# Patient Record
Sex: Male | Born: 1960 | Race: White | Hispanic: No | Marital: Married | Smoking: Former smoker
Health system: Southern US, Community
[De-identification: ages and names within clinical notes are randomized; demographics above are authoritative.]

## PROBLEM LIST (undated history)

## (undated) DIAGNOSIS — B1910 Unspecified viral hepatitis B without hepatic coma: Secondary | ICD-10-CM

## (undated) DIAGNOSIS — K859 Acute pancreatitis without necrosis or infection, unspecified: Secondary | ICD-10-CM

## (undated) DIAGNOSIS — Z87442 Personal history of urinary calculi: Secondary | ICD-10-CM

## (undated) DIAGNOSIS — I10 Essential (primary) hypertension: Secondary | ICD-10-CM

## (undated) DIAGNOSIS — E785 Hyperlipidemia, unspecified: Secondary | ICD-10-CM

## (undated) DIAGNOSIS — K219 Gastro-esophageal reflux disease without esophagitis: Secondary | ICD-10-CM

## (undated) DIAGNOSIS — Z5189 Encounter for other specified aftercare: Secondary | ICD-10-CM

## (undated) DIAGNOSIS — R161 Splenomegaly, not elsewhere classified: Secondary | ICD-10-CM

## (undated) DIAGNOSIS — F32A Depression, unspecified: Secondary | ICD-10-CM

## (undated) DIAGNOSIS — G4733 Obstructive sleep apnea (adult) (pediatric): Secondary | ICD-10-CM

## (undated) DIAGNOSIS — I2510 Atherosclerotic heart disease of native coronary artery without angina pectoris: Secondary | ICD-10-CM

## (undated) DIAGNOSIS — M5430 Sciatica, unspecified side: Secondary | ICD-10-CM

## (undated) DIAGNOSIS — A0472 Enterocolitis due to Clostridium difficile, not specified as recurrent: Secondary | ICD-10-CM

## (undated) DIAGNOSIS — IMO0001 Reserved for inherently not codable concepts without codable children: Secondary | ICD-10-CM

## (undated) DIAGNOSIS — M1990 Unspecified osteoarthritis, unspecified site: Secondary | ICD-10-CM

## (undated) DIAGNOSIS — F329 Major depressive disorder, single episode, unspecified: Secondary | ICD-10-CM

## (undated) DIAGNOSIS — Z8619 Personal history of other infectious and parasitic diseases: Secondary | ICD-10-CM

## (undated) DIAGNOSIS — F419 Anxiety disorder, unspecified: Secondary | ICD-10-CM

## (undated) DIAGNOSIS — D696 Thrombocytopenia, unspecified: Secondary | ICD-10-CM

## (undated) DIAGNOSIS — D759 Disease of blood and blood-forming organs, unspecified: Secondary | ICD-10-CM

## (undated) DIAGNOSIS — K759 Inflammatory liver disease, unspecified: Secondary | ICD-10-CM

## (undated) HISTORY — DX: Sciatica, unspecified side: M54.30

## (undated) HISTORY — PX: HAND SURGERY: SHX662

## (undated) HISTORY — DX: Gastro-esophageal reflux disease without esophagitis: K21.9

## (undated) HISTORY — DX: Obstructive sleep apnea (adult) (pediatric): G47.33

## (undated) HISTORY — PX: KNEE SURGERY: SHX244

## (undated) HISTORY — DX: Major depressive disorder, single episode, unspecified: F32.9

## (undated) HISTORY — DX: Unspecified osteoarthritis, unspecified site: M19.90

## (undated) HISTORY — DX: Splenomegaly, not elsewhere classified: R16.1

## (undated) HISTORY — DX: Hyperlipidemia, unspecified: E78.5

## (undated) HISTORY — DX: Atherosclerotic heart disease of native coronary artery without angina pectoris: I25.10

## (undated) HISTORY — PX: COLONOSCOPY W/ POLYPECTOMY: SHX1380

## (undated) HISTORY — DX: Depression, unspecified: F32.A

## (undated) HISTORY — DX: Essential (primary) hypertension: I10

## (undated) HISTORY — DX: Unspecified viral hepatitis B without hepatic coma: B19.10

## (undated) HISTORY — PX: SHOULDER SURGERY: SHX246

## (undated) HISTORY — DX: Thrombocytopenia, unspecified: D69.6

## (undated) HISTORY — PX: KYPHOPLASTY: SHX5884

## (undated) HISTORY — PX: BACK SURGERY: SHX140

---

## 1997-12-20 ENCOUNTER — Ambulatory Visit (HOSPITAL_BASED_OUTPATIENT_CLINIC_OR_DEPARTMENT_OTHER): Admission: RE | Admit: 1997-12-20 | Discharge: 1997-12-20 | Payer: Self-pay | Admitting: Orthopedic Surgery

## 1998-04-04 ENCOUNTER — Ambulatory Visit (HOSPITAL_BASED_OUTPATIENT_CLINIC_OR_DEPARTMENT_OTHER): Admission: RE | Admit: 1998-04-04 | Discharge: 1998-04-04 | Payer: Self-pay | Admitting: Orthopedic Surgery

## 1998-10-31 ENCOUNTER — Ambulatory Visit (HOSPITAL_BASED_OUTPATIENT_CLINIC_OR_DEPARTMENT_OTHER): Admission: RE | Admit: 1998-10-31 | Discharge: 1998-10-31 | Payer: Self-pay | Admitting: Orthopedic Surgery

## 1999-05-30 ENCOUNTER — Ambulatory Visit (HOSPITAL_BASED_OUTPATIENT_CLINIC_OR_DEPARTMENT_OTHER): Admission: RE | Admit: 1999-05-30 | Discharge: 1999-05-30 | Payer: Self-pay | Admitting: Orthopaedic Surgery

## 1999-10-19 ENCOUNTER — Ambulatory Visit (HOSPITAL_BASED_OUTPATIENT_CLINIC_OR_DEPARTMENT_OTHER): Admission: RE | Admit: 1999-10-19 | Discharge: 1999-10-19 | Payer: Self-pay | Admitting: Orthopaedic Surgery

## 1999-10-21 ENCOUNTER — Emergency Department (HOSPITAL_COMMUNITY): Admission: EM | Admit: 1999-10-21 | Discharge: 1999-10-21 | Payer: Self-pay | Admitting: Emergency Medicine

## 2000-03-19 ENCOUNTER — Encounter: Payer: Self-pay | Admitting: Orthopedic Surgery

## 2000-03-19 ENCOUNTER — Encounter: Admission: RE | Admit: 2000-03-19 | Discharge: 2000-03-19 | Payer: Self-pay | Admitting: Orthopedic Surgery

## 2003-02-23 ENCOUNTER — Emergency Department (HOSPITAL_COMMUNITY): Admission: EM | Admit: 2003-02-23 | Discharge: 2003-02-23 | Payer: Self-pay | Admitting: Emergency Medicine

## 2003-04-12 ENCOUNTER — Ambulatory Visit (HOSPITAL_COMMUNITY): Admission: RE | Admit: 2003-04-12 | Discharge: 2003-04-12 | Payer: Self-pay | Admitting: Family Medicine

## 2006-03-31 ENCOUNTER — Encounter: Admission: RE | Admit: 2006-03-31 | Discharge: 2006-03-31 | Payer: Self-pay | Admitting: Orthopaedic Surgery

## 2006-08-06 ENCOUNTER — Emergency Department (HOSPITAL_COMMUNITY): Admission: EM | Admit: 2006-08-06 | Discharge: 2006-08-06 | Payer: Self-pay | Admitting: Emergency Medicine

## 2006-09-12 ENCOUNTER — Emergency Department (HOSPITAL_COMMUNITY): Admission: EM | Admit: 2006-09-12 | Discharge: 2006-09-12 | Payer: Self-pay | Admitting: Emergency Medicine

## 2007-01-25 ENCOUNTER — Emergency Department (HOSPITAL_COMMUNITY): Admission: EM | Admit: 2007-01-25 | Discharge: 2007-01-25 | Payer: Self-pay | Admitting: Emergency Medicine

## 2007-02-25 ENCOUNTER — Emergency Department (HOSPITAL_COMMUNITY): Admission: EM | Admit: 2007-02-25 | Discharge: 2007-02-25 | Payer: Self-pay | Admitting: Emergency Medicine

## 2007-03-16 DIAGNOSIS — B1910 Unspecified viral hepatitis B without hepatic coma: Secondary | ICD-10-CM

## 2007-03-16 HISTORY — DX: Unspecified viral hepatitis B without hepatic coma: B19.10

## 2007-05-16 ENCOUNTER — Encounter: Admission: RE | Admit: 2007-05-16 | Discharge: 2007-08-14 | Payer: Self-pay | Admitting: Anesthesiology

## 2007-05-20 ENCOUNTER — Ambulatory Visit: Payer: Self-pay | Admitting: Anesthesiology

## 2007-06-17 ENCOUNTER — Ambulatory Visit: Payer: Self-pay | Admitting: Anesthesiology

## 2007-07-15 ENCOUNTER — Ambulatory Visit: Payer: Self-pay | Admitting: Anesthesiology

## 2007-07-31 ENCOUNTER — Encounter: Admission: RE | Admit: 2007-07-31 | Discharge: 2007-10-29 | Payer: Self-pay | Admitting: Anesthesiology

## 2007-08-05 ENCOUNTER — Ambulatory Visit: Payer: Self-pay | Admitting: Anesthesiology

## 2007-08-21 ENCOUNTER — Ambulatory Visit: Payer: Self-pay | Admitting: Anesthesiology

## 2007-09-09 ENCOUNTER — Ambulatory Visit: Payer: Self-pay | Admitting: Anesthesiology

## 2007-10-31 ENCOUNTER — Encounter: Admission: RE | Admit: 2007-10-31 | Discharge: 2007-11-04 | Payer: Self-pay | Admitting: Anesthesiology

## 2007-11-04 ENCOUNTER — Ambulatory Visit: Payer: Self-pay | Admitting: Anesthesiology

## 2009-07-07 ENCOUNTER — Encounter: Admission: RE | Admit: 2009-07-07 | Discharge: 2009-07-07 | Payer: Self-pay | Admitting: Family Medicine

## 2010-03-05 ENCOUNTER — Encounter: Payer: Self-pay | Admitting: Orthopaedic Surgery

## 2010-06-27 NOTE — Assessment & Plan Note (Signed)
REASON FOR VISIT:  Victor [name] is a pleasant, 50 year old who comes to  [place] with a right hand injury.  He comes to me with a TENS unit on, his  hand in a desensitizing glove and he is relating his pain as 7/10 on a  subjective scale.  It is sharp, constant tingling and aching.  His  medications are attached to the chart.  Work-related incident.  With his  permission, case manager to the room.  He has difficulty with  restorative sleep capacity.  He is currently not working.  He was in  heating and air.  He has been terminated because they did not have a  position for him.  He had some restrictions on an FCE.  In the interim,  Dr. Yolanda Bonine has performed another number of interventions.  He thinks  they just help temporarily.  He does not describe classic pseudomotor  changes.  He has been seen by [employer] and apparently, he  was felt to have carpal tunnel and some other orthopedic joint injuries.  Medications and allergies attached to chart.  A 14-point review of  systems.   PAST MEDICAL HISTORY:  1. Hypertension.  2. Diabetes.  3. Recent exposure to hepatitis B and he is jaundice.  He is followed      by Dr. Randa Evens.   FAMILY HISTORY:  Otherwise denied medical illnesses.   SOCIAL HISTORY:  Denies chemical ir alcohol abuse.  Denies IVDA.   Review of systems, family and social history otherwise noncontributory  to the pain problem.   PHYSICAL EXAMINATION:  GENERAL:  A pleasant male.  Gait, affect,  appearance is normal.  Oriented x3.  HEENT:  He is jaundice-appearing.  Sclerotic.  Otherwise unremarkable.  CHEST:  Clear to auscultation and percussion with increased AP diameter.  ABDOMEN:  I can barely feel the liver edge, but it is a little tender.  HEART:  Regular rate and rhythm without murmurs, rubs or gallops.  BACK:  Diffuse paralumbar, myofascial, cervical myofascial discomfort,  probably unrelated.  NEUROLOGIC:  His right hand is somewhat pulling away, but no  classic  pseudomotor changes.  Good capillary filling.  Fair grip strength, but  restrictive.  Decreased range of motion, active, but passive and full to  the wrist.  I do not find anything new from a neurological perspective.   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   PLAN:  1. Full informed consent to UDS.  2. According to his GI folks, he is unable to take any medications and      so he and his case manager will let me know when we can consider      treatment profile.  As I have reviewed the chart, there is some      evidence that he has obtained medications from multiple sources and      an ongoing adherence monitoring will be mandatory.  I don't think      necessarily that opioids would be are best first choice and I may      trial Ultram ER as we will avoid acetaminophen exposure.  3. Desensitization maneuvers will be discussed.  4. I do not plan interventions.  We will continue this when we follow      up in 1 month.  Will follow along.  Questions are answered and no      barrier to communication.          ______________________________  Celene Kras, MD    HH/MedQ  D:  05/20/2007 13:27:13  T:  05/20/2007 14:30:54  Job #:  [id number]

## 2010-06-27 NOTE — Assessment & Plan Note (Signed)
Victor [name] comes in our Pain Management today.  I evaluated him and  reviewed the Health and History form and 14-point review of systems.   1. Victor [name]'s hand has a normal appearance, good vascular integrity, and      adequate grip strength, no evidence of CRPS.  2. With his permission case manager to the room.  3. I think it is probably reasonable to go onto MMI.  We will go ahead      and follow him expectantly, and emphasize non-narcotic medication      alternatives, and the Flector is p.r.n. if he thinks it helps.  He      is not sure it does, so we will just rely on OTC Tylenol, NSAID,      and see how he does here.  4. We will see him at essentially p.r.n., reviewed with him, no      barrier to communication.   [name] as noted good vascular integrity, he has adequate grip  strength, nothing new neurologically.   IMPRESSION:  Osteoarthritis, possible complex regional pain syndrome  variant.   PLAN:  Conservative management MMI.  I agree impairment.           ______________________________  Celene Kras, MD     HH/MedQ  D:  11/04/2007 10:48:07  T:  11/05/2007 01:21:53  Job #:  [id number]

## 2010-06-27 NOTE — Assessment & Plan Note (Signed)
[name]  I  evaluated her and reviewed the Health and History form and 14-point  review of systems.   He has taken Darvocet.  He was unable to tolerate the [place] due to  potential drug interactions, but I am not really clear on that.  He does  have a patch, and it would be an option for [name], but I am  going to defer to nonnarcotic medication alternatives and I will trial  __________ .  As I relate him, it is probably time to come off  controlled substances, follow conservatively, and I have reviewed the  risk, complications, and options.  I plan to follow up with him in 2  months, and essentially from my position, MMI.   [name], his hand looks better than I have seen it in some time.  He  has adequate range of motion.  Good capillary filling.  He is using his  TENS technology.  As I understand that he has been cleared by his  gastroenterologist, and he is cautioned as to NSAID profile.   We have reviewed this medication.  Questions are answered.   We will see him in followup.           ______________________________  Celene Kras, MD     HH/MedQ  D:  09/09/2007 10:33:42  T:  09/10/2007 02:01:38  Job #:  [id number]

## 2010-06-27 NOTE — Assessment & Plan Note (Signed)
Victor [name] comes to [facility] today to review health  and history form and 14-point review of systems.  I examined him with  his permission and case manager to the room.   1. He is not showing any advancing pseudomotor changes.  He is showing      some edema, good capillary filling, and he has no significant      change in sensory or motor.  Neurologic stable.  2. I do not think he will probably need any blocks.  We will see how      he does over the next month and we may consider a 3-phase bone scan      but he is going to have to be cleared by his gastroenterologist, as      he is still under his purview, and he does not want him to have any      other medications.  He should be on Xanax and Nexium, I am going to      add Lidoderm which should be fine topically.  3. I discussed desensitization maneuvers.  4. Modifiable features in health profile discussed.  5. Full informed consent UDS.  This may help [place] with the latitude for      pain medications if we need to, but at this point I do not think it      is going to be necessary to go beyond non-narcotic medication      alternatives.  Consider adjuncts.  From our position, he will      probably be moving toward MMI fairly soon, and we will just monitor      him for med control and pain control, and assist in modifiable      features in health profile to best outcome.   OBJECTIVE:  His hand shows modest edema, good capillary filling,  adequate range of motion without significant change.  Nothing new  neurologically.   IMPRESSION:  Otherwise unchanged.  See RPS variant.  Peripheral  neuropathy, unspecified.   PLAN:  Conservative management.   DISCHARGE INSTRUCTIONS:  Given.  I will see him in followup.           ______________________________  Celene Kras, MD     HH/MedQ  D:  07/15/2007 11:48:08  T:  07/15/2007 12:55:25  Job #:  [id number]

## 2010-06-27 NOTE — Assessment & Plan Note (Signed)
1. [name] and I evaluated him      with the Health and History form with 10-point review of systems.      He has a note from his gastroenterologist that he is not to have      any medications.  He does, however, take Xanax for restorative      sleep capacity, and that showed up on the UDS.  I reviewed that      with him.  He has been cleared with Nexium with his primary care,      and I am thinking he probably would do fine with a tramadol-like      medication, avoiding acetaminophen and NSAID and would probably      metabolize well.  His liver function is improving.  I just do not      think we are going to be able to move into that direction at this      time as his gastroenterologist wants to give him another month.   1. To this end, we will go ahead and hold off.  We will see him in one      month and determine further course of care.  Likely second UDS.   1. We discussed treatment limitations and options.  I observed his      hand.  At some point, we probably want to consider a topical such      as Lidoderm or possibly Ultram ER.  I do not believe we want to go      too far into controlled substances.   1. I have again reviewed the UDS with him.  Problematic is that he      asked Dr. Isabell Jarvis for a narcotic prescription, and he stated he did      not know what he was signing last moth as he was ill, but we are      clear on that now, so any further deviations will be unfortunate      for discharge.   OBJECTIVE:  His hand appears with adequate but diminished grip strength.  Thenar eminence intact.  No evidence of pseudomotor changes.  He has his  TENS unit on.  He is wearing his desensitization glove, and I discussed  desensitization maneuvers.   IMPRESSION:  Peripheral neuropathy, unspecified.  CRP as variant.   PLAN:  Conservative management.  Discharge instructions given.  Modifiable features and help profile discussed.  We will see him  in one  month and determine further course of care.           ______________________________  Celene Kras, MD     HH/MedQ  D:  06/17/2007 11:56:47  T:  06/17/2007 12:27:31  Job #:  [id number]

## 2010-06-27 NOTE — Assessment & Plan Note (Signed)
Victor [name] comes in for the pain management today.  I evaluated him via  health and history form and 14-point review of systems.   1. I go over the UDS with him and I am going need to repeat that next      visit at random.  He stated he was not on any medication, but he      told to screener that a couple days before he had Percocet, but he      states he has had nothing since his hepatitis, which is an      inconsistent story.  Point is really moot, I do not think he is      going to need narcotics at this time, or controlled substances and      we can follow him conservatively.  We may put him on gabapentin or      Lyrica, I am going to go ahead and trial Rizol today, and he has      been cleared by his GI doctor for this.  2. His hand shows modest pseudomotor changes, he has a fair grip      strength, still using his hands and he states he is desensitizing      at home.  Continue in this direction.  Really, he is approaching      MMI, I think we can follow him expectantly.  I will see him in the      next month or two.  His case manager is not here, but we will      relate any information as required, and I do not think he is an      interventional candidate at this time.   [name] improving hand, modest pseudomotor changes, good grip  strength, good capillary filling, no pull away, and nothing new  neurologically.   IMPRESSION:  Complex regional pain syndrome.   PLAN:  Conservative management.  Discharge instructions given.           ______________________________  Celene Kras, MD     HH/MedQ  D:  08/05/2007 11:26:38  T:  08/06/2007 03:42:55  Job #:  [id number]

## 2010-06-30 NOTE — Op Note (Signed)
[place]  Patient:    DAIMIEN, PATMON                        MRN: [id number] Proc. Date: 10/19/99 Adm. Date:  [id number] Attending:  Randolm Idol                           Operative Report  PREOPERATIVE DIAGNOSIS:  Tear of medial meniscus left knee.  POSTOPERATIVE DIAGNOSIS:  Tear of medial meniscus left knee.  PROCEDURES: 1. Diagnostic arthroscopy left knee. 2. Partial medial meniscectomy.  SURGEON:  Claude Manges. Cleophas Dunker, M.D.  ANESTHESIA:  IV sedation and local with 1% Xylocaine.  COMPLICATIONS:  None.  HISTORY:  Mr. Kopf is 50 years old and recently returned to work after a prolonged absence from a problem referable to his right knee.  He injured his left knee approximately six weeks ago after returning to work.  He was in the back of a pickup truck, and the bedliner slipped, and he fell to the ground, twisting his knee.  He felt a snap or a pop.  He has had subsequent MRI scan revealing a horizontal tear of the posterior horn of the medial meniscus.  He does have a positive effusion and positive medial joint tenderness of the left knee.  He is now to have arthroscopic evaluation.  DESCRIPTION OF PROCEDURE:  The patient comfortable on the operating table and under IV sedation, the left lower extremity was placed in a thigh holder.  The leg was then prepped with Dura-Prep from the thigh holder to the ankle. Sterile draping was performed.  Diagnostic arthroscopy was performed, using a medial and lateral parapatellar tendon stab wound.  Diagnostic arthroscopy revealed little if any synovitis in the superior pouch. There was no chondromalacia of the patella.  The patella ______ in midline. There were no loose bodies.  Both gutters were clear.  The lateral compartment revealed no evidence of meniscal pathology or chondromalacia.  The ACL appeared to be intact with about a 2 mm anterior drawer sign with a definite end  point.  The medial compartment revealed a tear of the posterior 25-30% of the meniscus.  There were areas of horizontal cleavage tear.  These were debrided with the basket forceps and then the interarticular shaver.  The remaining rim was carefully probed and was intact without any further pathology.  There was an area of chondromalacia of the femoral condyle corresponding to the area of the meniscal tear, and this was shaved.  I did not see any area of exposed subchondral bone, and it probably represented a grade 2 change, possibly grade 3.  The joint was then explored without evidence of loose material.  Two stab wounds were left open and infiltrated with 0.25% Marcaine with epinephrine.  A sterile bulky dressing was applied followed by an ACE bandage.  PLAN:  Percocet for pain.  Office in one week.  No work. DD:  10/19/99 TD:  10/20/99 Job: [id number]

## 2010-08-08 ENCOUNTER — Other Ambulatory Visit: Payer: Self-pay | Admitting: Family Medicine

## 2010-08-08 DIAGNOSIS — M545 Low back pain, unspecified: Secondary | ICD-10-CM

## 2010-08-09 ENCOUNTER — Ambulatory Visit
Admission: RE | Admit: 2010-08-09 | Discharge: 2010-08-09 | Disposition: A | Discharge status: Home / Self Care | Payer: BC Managed Care – PPO | Source: Ambulatory Visit | Attending: Family Medicine | Admitting: Family Medicine

## 2010-08-09 DIAGNOSIS — M545 Low back pain, unspecified: Secondary | ICD-10-CM

## 2010-11-01 LAB — BASIC METABOLIC PANEL
BUN: 18
CO2: 25
Calcium: 9.2
Chloride: 103
Creatinine, Ser: 1.14
GFR calc Af Amer: >60
GFR calc non Af Amer: >60
Glucose, Bld: 90
Potassium: 4
Sodium: 137

## 2010-11-01 LAB — DIFFERENTIAL
Basophils Absolute: 0
Basophils Relative: 0
Eosinophils Absolute: 0.1
Eosinophils Relative: 2
Lymphocytes Relative: 39
Lymphs Abs: 3.4
Monocytes Absolute: 0.8
Monocytes Relative: 9
Neutro Abs: 4.4
Neutrophils Relative %: 51

## 2010-11-01 LAB — CBC
HCT: 44
Hemoglobin: 15.3
MCHC: 34.7
MCV: 86.6
Platelets: 198
RBC: 5.08
RDW: 14.3
WBC: 8.7

## 2010-11-20 LAB — DIFFERENTIAL
Basophils Absolute: 0
Basophils Relative: 1
Eosinophils Absolute: 0.2
Eosinophils Relative: 2
Lymphocytes Relative: 31
Lymphs Abs: 2.5
Monocytes Absolute: 0.7
Monocytes Relative: 8
Neutro Abs: 4.6
Neutrophils Relative %: 58

## 2010-11-20 LAB — I-STAT 8, (EC8 V) (CONVERTED LAB)
BUN: 14
Bicarbonate: 25.8 — ABNORMAL HIGH
Chloride: 101
Glucose, Bld: 125 — ABNORMAL HIGH
HCT: 47
Hemoglobin: 16
Operator id: 294521
Potassium: 3.5
Sodium: 135
TCO2: 27
pCO2, Ven: 44.1 — ABNORMAL LOW
pH, Ven: 7.375 — ABNORMAL HIGH

## 2010-11-20 LAB — POCT I-STAT CREATININE
Creatinine, Ser: 1
Operator id: 294521

## 2010-11-20 LAB — ETHANOL: Alcohol, Ethyl (B): 5

## 2010-11-20 LAB — CBC
HCT: 45.4
Hemoglobin: 15.3
MCHC: 33.6
MCV: 86.8
Platelets: 194
RBC: 5.24
RDW: 13.8
WBC: 7.9

## 2010-11-29 LAB — BASIC METABOLIC PANEL
BUN: 14
CO2: 20
Calcium: 8.8
Chloride: 101
Creatinine, Ser: 1.37
GFR calc Af Amer: >60
GFR calc non Af Amer: 56 — ABNORMAL LOW
Glucose, Bld: 299 — ABNORMAL HIGH
Potassium: 3.7
Sodium: 130 — ABNORMAL LOW

## 2010-11-29 LAB — POCT CARDIAC MARKERS
CKMB, poc: 1.4
CKMB, poc: 1.4
CKMB, poc: 1.8
Myoglobin, poc: 55.7
Myoglobin, poc: 70.7
Myoglobin, poc: 76.4
Operator id: 4531
Operator id: 4531
Operator id: 4531
Troponin i, poc: <0.05
Troponin i, poc: <0.05
Troponin i, poc: <0.05

## 2010-11-29 LAB — CBC
HCT: 46.8
Hemoglobin: 15.8
MCHC: 33.7
MCV: 86
Platelets: 228
RBC: 5.45
RDW: 12.5
WBC: 13.4 — ABNORMAL HIGH

## 2010-11-29 LAB — DIFFERENTIAL
Basophils Absolute: 0.2 — ABNORMAL HIGH
Basophils Relative: 2 — ABNORMAL HIGH
Eosinophils Absolute: 0
Eosinophils Relative: 0
Lymphocytes Relative: 6 — ABNORMAL LOW
Lymphs Abs: 0.8
Monocytes Absolute: 0.1 — ABNORMAL LOW
Monocytes Relative: 0 — ABNORMAL LOW
Neutro Abs: 12.3 — ABNORMAL HIGH
Neutrophils Relative %: 92 — ABNORMAL HIGH

## 2010-11-29 LAB — D-DIMER, QUANTITATIVE: D-Dimer, Quant: 0.38

## 2010-12-05 ENCOUNTER — Encounter (HOSPITAL_COMMUNITY): Payer: BC Managed Care – PPO

## 2010-12-05 ENCOUNTER — Other Ambulatory Visit: Payer: Self-pay | Admitting: Specialist

## 2010-12-05 ENCOUNTER — Other Ambulatory Visit (HOSPITAL_COMMUNITY): Payer: Self-pay | Admitting: Specialist

## 2010-12-05 ENCOUNTER — Ambulatory Visit (HOSPITAL_COMMUNITY)
Admission: RE | Admit: 2010-12-05 | Discharge: 2010-12-05 | Disposition: A | Discharge status: Home / Self Care | Payer: BC Managed Care – PPO | Source: Ambulatory Visit | Attending: Specialist | Admitting: Specialist

## 2010-12-05 DIAGNOSIS — Z01811 Encounter for preprocedural respiratory examination: Secondary | ICD-10-CM | POA: Insufficient documentation

## 2010-12-05 DIAGNOSIS — M48061 Spinal stenosis, lumbar region without neurogenic claudication: Secondary | ICD-10-CM | POA: Insufficient documentation

## 2010-12-05 DIAGNOSIS — I10 Essential (primary) hypertension: Secondary | ICD-10-CM | POA: Insufficient documentation

## 2010-12-05 DIAGNOSIS — E119 Type 2 diabetes mellitus without complications: Secondary | ICD-10-CM | POA: Insufficient documentation

## 2010-12-05 DIAGNOSIS — M545 Low back pain, unspecified: Secondary | ICD-10-CM | POA: Insufficient documentation

## 2010-12-05 DIAGNOSIS — M79609 Pain in unspecified limb: Secondary | ICD-10-CM | POA: Insufficient documentation

## 2010-12-05 DIAGNOSIS — Z01818 Encounter for other preprocedural examination: Secondary | ICD-10-CM | POA: Insufficient documentation

## 2010-12-05 LAB — COMPREHENSIVE METABOLIC PANEL
ALT: 36 U/L (ref 0–53)
AST: 26 U/L (ref 0–37)
Albumin: 3.6 g/dL (ref 3.5–5.2)
Alkaline Phosphatase: 73 U/L (ref 39–117)
BUN: 11 mg/dL (ref 6–23)
CO2: 26 mEq/L (ref 19–32)
Calcium: 9.6 mg/dL (ref 8.4–10.5)
Chloride: 102 mEq/L (ref 96–112)
Creatinine, Ser: 0.73 mg/dL (ref 0.50–1.35)
GFR calc Af Amer: >90 mL/min (ref >90)
GFR calc non Af Amer: >90 mL/min (ref >90)
Glucose, Bld: 175 mg/dL — ABNORMAL HIGH (ref 70–99)
Potassium: 3.8 mEq/L (ref 3.5–5.1)
Sodium: 137 mEq/L (ref 135–145)
Total Bilirubin: 0.6 mg/dL (ref 0.3–1.2)
Total Protein: 7.1 g/dL (ref 6.0–8.3)

## 2010-12-05 LAB — CBC
HCT: 40 % (ref 39.0–52.0)
Hemoglobin: 13.7 g/dL (ref 13.0–17.0)
MCH: 29.7 pg (ref 26.0–34.0)
MCHC: 34.3 g/dL (ref 30.0–36.0)
MCV: 86.8 fL (ref 78.0–100.0)
Platelets: 69 10*3/uL — ABNORMAL LOW (ref 150–400)
RBC: 4.61 MIL/uL (ref 4.22–5.81)
RDW: 13.3 % (ref 11.5–15.5)
WBC: 5.6 10*3/uL (ref 4.0–10.5)

## 2010-12-05 LAB — PROTIME-INR
INR: 1.18 (ref 0.00–1.49)
Prothrombin Time: 15.3 seconds — ABNORMAL HIGH (ref 11.6–15.2)

## 2010-12-05 LAB — URINALYSIS, ROUTINE W REFLEX MICROSCOPIC
Bilirubin Urine: NEGATIVE
Glucose, UA: NEGATIVE mg/dL
Hgb urine dipstick: NEGATIVE
Leukocytes, UA: NEGATIVE
Nitrite: NEGATIVE
Protein, ur: NEGATIVE mg/dL
Specific Gravity, Urine: 1.03 (ref 1.005–1.030)
Urobilinogen, UA: 1 mg/dL (ref 0.0–1.0)
pH: 6 (ref 5.0–8.0)

## 2010-12-05 LAB — SURGICAL PCR SCREEN
MRSA, PCR: NEGATIVE
Staphylococcus aureus: NEGATIVE

## 2010-12-05 LAB — APTT: aPTT: 31 seconds (ref 24–37)

## 2010-12-13 ENCOUNTER — Ambulatory Visit (HOSPITAL_COMMUNITY): Payer: BC Managed Care – PPO

## 2010-12-13 ENCOUNTER — Other Ambulatory Visit: Payer: Self-pay | Admitting: Oncology

## 2010-12-13 ENCOUNTER — Other Ambulatory Visit (HOSPITAL_COMMUNITY): Payer: Self-pay | Admitting: Specialist

## 2010-12-13 ENCOUNTER — Encounter: Payer: Self-pay | Admitting: Oncology

## 2010-12-13 ENCOUNTER — Ambulatory Visit (HOSPITAL_COMMUNITY)
Admission: RE | Admit: 2010-12-13 | Discharge: 2010-12-13 | Disposition: A | Discharge status: Home / Self Care | Payer: BC Managed Care – PPO | Source: Ambulatory Visit | Attending: Specialist | Admitting: Specialist

## 2010-12-13 DIAGNOSIS — K219 Gastro-esophageal reflux disease without esophagitis: Secondary | ICD-10-CM | POA: Insufficient documentation

## 2010-12-13 DIAGNOSIS — M47817 Spondylosis without myelopathy or radiculopathy, lumbosacral region: Secondary | ICD-10-CM | POA: Insufficient documentation

## 2010-12-13 DIAGNOSIS — D696 Thrombocytopenia, unspecified: Secondary | ICD-10-CM

## 2010-12-13 DIAGNOSIS — E785 Hyperlipidemia, unspecified: Secondary | ICD-10-CM

## 2010-12-13 DIAGNOSIS — E119 Type 2 diabetes mellitus without complications: Secondary | ICD-10-CM | POA: Insufficient documentation

## 2010-12-13 DIAGNOSIS — Z01812 Encounter for preprocedural laboratory examination: Secondary | ICD-10-CM | POA: Insufficient documentation

## 2010-12-13 DIAGNOSIS — M545 Low back pain, unspecified: Secondary | ICD-10-CM

## 2010-12-13 DIAGNOSIS — Z01818 Encounter for other preprocedural examination: Secondary | ICD-10-CM | POA: Insufficient documentation

## 2010-12-13 DIAGNOSIS — Z5309 Procedure and treatment not carried out because of other contraindication: Secondary | ICD-10-CM | POA: Insufficient documentation

## 2010-12-13 DIAGNOSIS — M5430 Sciatica, unspecified side: Secondary | ICD-10-CM

## 2010-12-13 DIAGNOSIS — I10 Essential (primary) hypertension: Secondary | ICD-10-CM | POA: Insufficient documentation

## 2010-12-13 DIAGNOSIS — B1910 Unspecified viral hepatitis B without hepatic coma: Secondary | ICD-10-CM

## 2010-12-13 DIAGNOSIS — Z79899 Other long term (current) drug therapy: Secondary | ICD-10-CM | POA: Insufficient documentation

## 2010-12-13 LAB — FOLATE: Folate: 18.6 ng/mL

## 2010-12-13 LAB — GLUCOSE, CAPILLARY
Glucose-Capillary: 125 mg/dL — ABNORMAL HIGH (ref 70–99)
Glucose-Capillary: 134 mg/dL — ABNORMAL HIGH (ref 70–99)
Glucose-Capillary: 170 mg/dL — ABNORMAL HIGH (ref 70–99)

## 2010-12-13 LAB — PLATELET FUNCTION ASSAY: Collagen / Epinephrine: 151 seconds (ref 0–184)

## 2010-12-13 LAB — SAVE SMEAR

## 2010-12-13 LAB — PLATELET COUNT
Platelets: 82 10*3/uL — ABNORMAL LOW (ref 150–400)
Platelets: 61 10*3/uL — ABNORMAL LOW (ref 150–400)

## 2010-12-13 LAB — ABO/RH: ABO/RH(D): A POS

## 2010-12-14 ENCOUNTER — Other Ambulatory Visit: Payer: Self-pay | Admitting: Oncology

## 2010-12-14 ENCOUNTER — Encounter (HOSPITAL_BASED_OUTPATIENT_CLINIC_OR_DEPARTMENT_OTHER): Payer: BC Managed Care – PPO | Admitting: Oncology

## 2010-12-14 ENCOUNTER — Encounter: Payer: BC Managed Care – PPO | Admitting: Oncology

## 2010-12-14 DIAGNOSIS — Y849 Medical procedure, unspecified as the cause of abnormal reaction of the patient, or of later complication, without mention of misadventure at the time of the procedure: Secondary | ICD-10-CM

## 2010-12-14 DIAGNOSIS — K766 Portal hypertension: Secondary | ICD-10-CM

## 2010-12-14 DIAGNOSIS — B181 Chronic viral hepatitis B without delta-agent: Secondary | ICD-10-CM

## 2010-12-14 LAB — CBC WITH DIFFERENTIAL/PLATELET
BASO%: 0.2 % (ref 0.0–2.0)
Basophils Absolute: 0 10*3/uL (ref 0.0–0.1)
EOS%: 2.5 % (ref 0.0–7.0)
Eosinophils Absolute: 0.1 10*3/uL (ref 0.0–0.5)
HCT: 41.5 % (ref 38.4–49.9)
HGB: 14 g/dL (ref 13.0–17.1)
LYMPH%: 27 % (ref 14.0–49.0)
MCH: 30.2 pg (ref 27.2–33.4)
MCHC: 33.7 g/dL (ref 32.0–36.0)
MCV: 89.7 fL (ref 79.3–98.0)
MONO#: 0.4 10*3/uL (ref 0.1–0.9)
MONO%: 7.9 % (ref 0.0–14.0)
NEUT#: 3.1 10*3/uL (ref 1.5–6.5)
NEUT%: 62.4 % (ref 39.0–75.0)
Platelets: 71 10*3/uL — ABNORMAL LOW (ref 140–400)
RBC: 4.62 10*6/uL (ref 4.20–5.82)
RDW: 14.1 % (ref 11.0–14.6)
WBC: 4.9 10*3/uL (ref 4.0–10.3)
lymph#: 1.3 10*3/uL (ref 0.9–3.3)

## 2010-12-14 LAB — MORPHOLOGY: PLT EST: DECREASED

## 2010-12-14 LAB — PREPARE PLATELET PHERESIS
Unit division: 0
Unit division: 0
Unit division: 0

## 2010-12-14 LAB — CHCC SMEAR

## 2010-12-14 NOTE — Consult Note (Signed)
NAME:  Victor [name], Victor [name] NO.:  [id number]  MEDICAL RECORD NO.:  [id number]  LOCATION:  DAYL                         FACILITY:  [facility]  PHYSICIAN:  Victor [name], M.D.        DATE OF BIRTH:  [birth year]  DATE OF CONSULTATION:  12/13/2010 DATE OF DISCHARGE:                                CONSULTATION   REQUESTING PHYSICIAN:  Victor [name], M.D.  REASON FOR CONSULT:  Thrombocytopenia.  CONSULTING PHYSICIAN:  Victor [name], M.D.  HISTORY OF PRESENT ILLNESS:  Victor [name] is a pleasant 50 year old male with a recurrent history of thrombocytopenia at least dating back to the year 2005.  The patient has been evaluated as an outpatient regarding this issue, and was to be referred to a hematologist for further workup, when he had to be admitted electively to [facility] for L5-S1 decompression on the left side.  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 His platelet count on December 08, 2010, was 69,000 and repeat platelet count was 82,000 today.  In preparation for surgery, 1 unit of platelets has been ordered.  A smear has been requested for further review.  Labs on February 23, 2003, showed platelet count of 84,000, on August 06, 2006, was 92,000.  Between January 25, 2007, and February 25, 2007, it was in the 190s.  However, this value dropped again during the last month.  He denies any acute bleeding.  No recent treatment.  He denies any new medications.  Of note, he has been taking on and off NSAIDs, but in not significant amounts.  He denies any fever, chills, or night sweats.  No headaches, mental status changes, or vision changes.  He denies any respiratory or cardiac complaints.  He also denies any abdominal pain. Of note, he did have either prior exposure or history of hepatitis, which per the patient's report is B type, which records have been requested, for further review.  He denies any risk for HIV.  No weight loss or fatigue.  His main complaint is back pain for  which he has been operated today.  We were asked to see him in consultation with recommendations regarding his care.  PAST MEDICAL HISTORY: 1. History of hepatitis B as reported by the patient, "not treated,     they let it run its course."  I personally talked to Dr. Carman [name] who treated him in the past.  Patient has not seen Dr.      Randa [name] for a while, and paper record was not readily avaible.       Dr. Randa [name] will try to track this down and fax to me in the     near future. 2. Hypertension. 3. Diabetes mellitus type 2. 4. Remote history of tobacco. 5. GERD. 6. Depression. 7. Osteoarthritis.  SURGERIES: 1. Status post diagnostic arthroscopy of the right knee, with     plicectomy, shaving of chondromalacia of the patellofemoral joint     and microfracture technique to the trochlea and shaving of the     medial femoral chondral by Dr. Cleophas [name] on May 31, 1999. 2. Status post diagnostic arthroscopy of the  left knee, with partial     medial meniscectomy, Dr. Cleophas [name] on October 19, 1999. 3. Status post right hand surgery per the patient's report. 4. Status post left shoulder surgery, per the patient's report. 5. Status post four other surgeries of the right knee, per the     patient's report. 6. Status post three other surgeries of the left knee per the     patient's report.  ALLERGIES:  FLEXERIL, DARVOCET, VICODIN, and DEMEROL.  MEDICATIONS:  Now are: 1. Bupivacaine as directed. 2. Ancef 2 g IV as directed. 3. Fentanyl 100 mcg as directed. 4. Percocet 2 tabs p.o. to OR. 5. Acetaminophen 1000 mg IV as directed. 6. Double antibiotic irrigation 500 mg IR as directed. 7. The patient is receiving 1 unit of platelets at this time.  At home, his medications include Dilaudid, Emsam, Nexium, "blood pressure medication," fish oil, Januvia, Lipitor, and Xanax.  REVIEW OF SYSTEMS:  See HPI for significant positives, the rest of the review of systems is negative.   The patient denies any easy bruising.  FAMILY HISTORY:  Mother died at 45 with lung cancer, father is alive and well.  He has 3 brothers and 3 sisters, all in good health.  There is no family history of thrombocytopenia, or other blood dyscrasias.  SOCIAL HISTORY:  The patient is married to wife Victor [name] over the last 15 years.  He has one son in good health.  He lives in [place], [place].  Full code.  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  Due to debilitation of the right hand, he is not currently working.  He smoked remotely, quit 25 years ago.  He denies any alcohol or recreational drug use.  He never had a colonoscopy.  His last physical was about 3 months ago.  PHYSICAL EXAMINATION:  GENERAL:  This is an obese 50 year old white male, in no acute distress.  Alert and oriented x3. VITAL SIGNS:  Blood pressure 145/83, pulse 70, respirations 18, temperature 98, O2 sat 98% on room air.  Weight 126 kg, height 72 inches. HEENT:  Normocephalic, atraumatic.  Sclerae anicteric.  Oral cavity without thrush or lesions. NECK:  Supple.  No cervical or supraclavicular masses. LUNGS:  Essentially clear to auscultation with no wheezing, rhonchi, or rales.  No accessory muscles. CARDIOVASCULAR:  Regular rate and rhythm without murmurs, rubs, or gallops. ABDOMEN:  Soft, nontender.  Bowel sounds x4.  No hepatosplenomegaly. His abdomen is obese. GU AND RECTAL:  Deferred. EXTREMITIES:  No clubbing or cyanosis.  No edema.  No inguinal masses, no bruising or petechial rash. NEURO:  Nonfocal except for his main complaint at the spinal area, which causes debilitation, which is being followed by his surgeon.  LABORATORY DATA:  Hemoglobin 13.7, hematocrit 40, white count 5.6, platelets 82, MCV 86.8, ANC 4.4, lymphocytes 3.4, monocytes 0.8.  PTT 31, PT 15.3, INR 1.18.  Sodium 137, potassium 3.8, BUN 11, creatinine 0.73, glucose 175, total bilirubin 0.6, alkaline phosphatase  73, AST 26 , ALT 36, total protein 7.1, albumin 3.6, calcium 9.6.   I personally reviewed his peripheral blood smear today.  There was isocytosis.  There was no peripheral blast.  There was no schistocytosis, spherocytosis, target cell, rouleaux formation, tear drop cell.  There was no giant platelets or platelet clumps.     I personally reviewed his peripheral blood smear today.  There was isocytosis.  There was no peripheral blast.  The neutrophils appeared low hypogranulated.  There  was no schistocytosis, spherocytosis, target cell, rouleaux formation, tear drop cell.   There was no giant platelets or platelet clumps.      problem list:    #1 symptomatic left septic [place].  #2 personal report a history of hepatitis B. Record is pending.  #3 diabetic mellitus. #4 hypertension.  #5 hyperlipidemia.  #6 chronic thrombocytopenia going on for at least 2 years.   impression:   my differential diagnosis for this patient include hepatitis B versus cirrhosis versus splenomegaly versus statin-induced thrombocytopenia versus less likely chronic  ITP  and the primary bone marrow failure states. However given the  progression nature of his thrombocytopenia; patient reports that it was in the low [address] until now [address]. I cannot rule out primary bone marrow failure problem without further workup for such diagnosis of myelofibrosis and myelodysplastic  syndrome.    recommendations: I recommended outpatient lab work up with me in the next few days for hepatitis B, hep C; HIV, ANA, TSH. Vitamin B12 and folate levels were sent earlier today.   outpatient abdominal ultrasound with Doppler to rule out cirrhosis and  splenomegaly division hepatitis B. Outpatient bone marrow biopsy to rule out primary bone marrow failure states such as myelofibrosis given that he has hypo- granulation of the granuloctyes and progressive  thrombocytopenia. stop statin for now which can  contribute to   thrombocytopenia. if workup is negative the diagnosis of exclusion his ITP and may consider a course of prednisone at that time if his thrombocytopenia worsens significantly.  Even though he did not get a bump of his the counts with  transfusion of the today, I see no evidence of microangiopathic hemolytic anemia such as DIC or TTP on blood smear review today. Hold off on surgery as I discussed Dr. Jillyn Hidden today until  his thrombocytopenia is worked up. I will follow the patient as  outpatient.   thank you for this kind consultation.   Victor [name], M.D.     HTH/MEDQ  D:  12/13/2010  T:  12/13/2010  Job:  [id number]  cc:   Tally Joe, M.D. Fax: [phone]  Electronically Signed by Jethro Bolus MD on 12/14/2010 08:10:47 PM

## 2010-12-17 LAB — HEPATITIS B CORE ANTIBODY, TOTAL: Hep B Core Total Ab: POSITIVE — AB

## 2010-12-18 ENCOUNTER — Other Ambulatory Visit: Payer: Self-pay | Admitting: Oncology

## 2010-12-19 LAB — HEPATITIS B SURFACE ANTIGEN: Hepatitis B Surface Ag: NEGATIVE

## 2010-12-19 LAB — HIV ANTIBODY (ROUTINE TESTING W REFLEX): HIV: NONREACTIVE

## 2010-12-19 LAB — TSH: TSH: 0.813 u[IU]/mL (ref 0.350–4.500)

## 2010-12-19 LAB — HEPATITIS B SURFACE ANTIBODY,QUALITATIVE: Hep B S Ab: POSITIVE — AB

## 2010-12-20 ENCOUNTER — Other Ambulatory Visit: Payer: Self-pay | Admitting: Oncology

## 2010-12-20 ENCOUNTER — Ambulatory Visit (HOSPITAL_COMMUNITY)
Admission: RE | Admit: 2010-12-20 | Discharge: 2010-12-20 | Disposition: A | Discharge status: Home / Self Care | Payer: BC Managed Care – PPO | Source: Ambulatory Visit | Attending: Oncology | Admitting: Oncology

## 2010-12-20 DIAGNOSIS — K8020 Calculus of gallbladder without cholecystitis without obstruction: Secondary | ICD-10-CM | POA: Insufficient documentation

## 2010-12-20 DIAGNOSIS — D696 Thrombocytopenia, unspecified: Secondary | ICD-10-CM

## 2010-12-20 DIAGNOSIS — K7460 Unspecified cirrhosis of liver: Secondary | ICD-10-CM | POA: Insufficient documentation

## 2010-12-20 DIAGNOSIS — K766 Portal hypertension: Secondary | ICD-10-CM

## 2010-12-20 DIAGNOSIS — R161 Splenomegaly, not elsewhere classified: Secondary | ICD-10-CM | POA: Insufficient documentation

## 2010-12-21 ENCOUNTER — Telehealth: Payer: Self-pay | Admitting: Oncology

## 2010-12-21 NOTE — Telephone Encounter (Signed)
lmonvm of the pt advising him that the appts have been r/s from 12/29/2010 due to the bmbx procedure has to be done prior. R/s the appts to dec

## 2010-12-22 ENCOUNTER — Telehealth: Payer: Self-pay | Admitting: Oncology

## 2010-12-22 NOTE — Telephone Encounter (Signed)
Pt called to r/s his biopsy appt transferred the caller over to rad central scheduling.

## 2010-12-29 ENCOUNTER — Other Ambulatory Visit: Payer: Self-pay | Admitting: Radiology

## 2010-12-29 ENCOUNTER — Inpatient Hospital Stay: Payer: BC Managed Care – PPO | Admitting: Oncology

## 2010-12-29 ENCOUNTER — Encounter (HOSPITAL_COMMUNITY): Payer: Self-pay | Admitting: Pharmacy Technician

## 2011-01-01 ENCOUNTER — Other Ambulatory Visit (HOSPITAL_COMMUNITY): Payer: BC Managed Care – PPO

## 2011-01-01 ENCOUNTER — Encounter (HOSPITAL_COMMUNITY): Payer: Self-pay

## 2011-01-01 ENCOUNTER — Ambulatory Visit (HOSPITAL_COMMUNITY)
Admission: RE | Admit: 2011-01-01 | Discharge: 2011-01-01 | Disposition: A | Discharge status: Home / Self Care | Payer: BC Managed Care – PPO | Source: Ambulatory Visit | Attending: Oncology | Admitting: Oncology

## 2011-01-01 ENCOUNTER — Other Ambulatory Visit: Payer: Self-pay | Admitting: Diagnostic Radiology

## 2011-01-01 DIAGNOSIS — D696 Thrombocytopenia, unspecified: Secondary | ICD-10-CM

## 2011-01-01 DIAGNOSIS — E119 Type 2 diabetes mellitus without complications: Secondary | ICD-10-CM | POA: Insufficient documentation

## 2011-01-01 DIAGNOSIS — K219 Gastro-esophageal reflux disease without esophagitis: Secondary | ICD-10-CM | POA: Insufficient documentation

## 2011-01-01 DIAGNOSIS — E785 Hyperlipidemia, unspecified: Secondary | ICD-10-CM | POA: Insufficient documentation

## 2011-01-01 DIAGNOSIS — Z79899 Other long term (current) drug therapy: Secondary | ICD-10-CM | POA: Insufficient documentation

## 2011-01-01 DIAGNOSIS — I10 Essential (primary) hypertension: Secondary | ICD-10-CM | POA: Insufficient documentation

## 2011-01-01 HISTORY — DX: Anxiety disorder, unspecified: F41.9

## 2011-01-01 LAB — GLUCOSE, CAPILLARY: Glucose-Capillary: 194 mg/dL — ABNORMAL HIGH (ref 70–99)

## 2011-01-01 LAB — CBC
HCT: 37.7 % — ABNORMAL LOW (ref 39.0–52.0)
Hemoglobin: 12.8 g/dL — ABNORMAL LOW (ref 13.0–17.0)
MCH: 29.3 pg (ref 26.0–34.0)
MCHC: 34 g/dL (ref 30.0–36.0)
MCV: 86.3 fL (ref 78.0–100.0)
Platelets: 59 10*3/uL — ABNORMAL LOW (ref 150–400)
RBC: 4.37 MIL/uL (ref 4.22–5.81)
RDW: 13.1 % (ref 11.5–15.5)
WBC: 4 10*3/uL (ref 4.0–10.5)

## 2011-01-01 LAB — PROTIME-INR
INR: 1.1 (ref 0.00–1.49)
Prothrombin Time: 14.4 seconds (ref 11.6–15.2)

## 2011-01-01 LAB — APTT: aPTT: 29 seconds (ref 24–37)

## 2011-01-01 MED ORDER — SODIUM CHLORIDE 0.9 % IV SOLN
INTRAVENOUS | Status: DC
  Administered 2011-01-01: 08:00:00 via INTRAVENOUS

## 2011-01-01 MED ORDER — FENTANYL CITRATE 0.05 MG/ML IJ SOLN
INTRAMUSCULAR | Status: AC | PRN
  Administered 2011-01-01: 50 ug via INTRAVENOUS
  Administered 2011-01-01: 100 ug via INTRAVENOUS
  Administered 2011-01-01: 50 ug via INTRAVENOUS

## 2011-01-01 MED ORDER — MIDAZOLAM HCL 5 MG/5ML IJ SOLN
INTRAMUSCULAR | Status: AC | PRN
  Administered 2011-01-01 (×2): 1 mg via INTRAVENOUS
  Administered 2011-01-01: 2 mg via INTRAVENOUS

## 2011-01-01 NOTE — H&P (View-Only) (Signed)
NAME:  Netherland, Korben                 ACCOUNT NO.:  [id number]  MEDICAL RECORD NO.:  [id number]  LOCATION:  DAYL                         FACILITY:  [facility]  PHYSICIAN:  Kaivon Livesey T Sabrie Moritz, M.D.        DATE OF BIRTH:  07/17/1960  DATE OF CONSULTATION:  12/13/2010 DATE OF DISCHARGE:                                CONSULTATION   REQUESTING PHYSICIAN:  Jeffrey Beane, M.D.  REASON FOR CONSULT:  Thrombocytopenia.  CONSULTING PHYSICIAN:  Chandlar Staebell T Trudie Cervantes, M.D.  HISTORY OF PRESENT ILLNESS:  Mr. Iseman is a pleasant 50-year-old male with a recurrent history of thrombocytopenia at least dating back to the year 2005.  The patient has been evaluated as an outpatient regarding this issue, and was to be referred to a hematologist for further workup, when he had to be admitted electively to [facility] for L5-S1 decompression on the left side.  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 His platelet count on December 08, 2010, was 69,000 and repeat platelet count was 82,000 today.  In preparation for surgery, 1 unit of platelets has been ordered.  A smear has been requested for further review.  Labs on February 23, 2003, showed platelet count of 84,000, on August 06, 2006, was 92,000.  Between January 25, 2007, and February 25, 2007, it was in the 190s.  However, this value dropped again during the last month.  He denies any acute bleeding.  No recent treatment.  He denies any new medications.  Of note, he has been taking on and off NSAIDs, but in not significant amounts.  He denies any fever, chills, or night sweats.  No headaches, mental status changes, or vision changes.  He denies any respiratory or cardiac complaints.  He also denies any abdominal pain. Of note, he did have either prior exposure or history of hepatitis, which per the patient's report is B type, which records have been requested, for further review.  He denies any risk for HIV.  No weight loss or fatigue.  His main complaint is back pain for  which he has been operated today.  We were asked to see him in consultation with recommendations regarding his care.  PAST MEDICAL HISTORY: 1. History of hepatitis B as reported by the patient, "not treated,     they let it run its course."  I personally talked to Dr. James     Edwards who treated him in the past.  Patient has not seen Dr.      Edwards for a while, and paper record was not readily avaible.       Dr. Edwards will try to track this down and fax to me in the     near future. 2. Hypertension. 3. Diabetes mellitus type 2. 4. Remote history of tobacco. 5. GERD. 6. Depression. 7. Osteoarthritis.  SURGERIES: 1. Status post diagnostic arthroscopy of the right knee, with     plicectomy, shaving of chondromalacia of the patellofemoral joint     and microfracture technique to the trochlea and shaving of the     medial femoral chondral by Dr. Whitfield on May 31, 1999. 2. Status post diagnostic arthroscopy of the   left knee, with partial     medial meniscectomy, Dr. Whitfield on October 19, 1999. 3. Status post right hand surgery per the patient's report. 4. Status post left shoulder surgery, per the patient's report. 5. Status post four other surgeries of the right knee, per the     patient's report. 6. Status post three other surgeries of the left knee per the     patient's report.  ALLERGIES:  FLEXERIL, DARVOCET, VICODIN, and DEMEROL.  MEDICATIONS:  Now are: 1. Bupivacaine as directed. 2. Ancef 2 g IV as directed. 3. Fentanyl 100 mcg as directed. 4. Percocet 2 tabs p.o. to OR. 5. Acetaminophen 1000 mg IV as directed. 6. Double antibiotic irrigation 500 mg IR as directed. 7. The patient is receiving 1 unit of platelets at this time.  At home, his medications include Dilaudid, Emsam, Nexium, "blood pressure medication," fish oil, Januvia, Lipitor, and Xanax.  REVIEW OF SYSTEMS:  See HPI for significant positives, the rest of the review of systems is negative.   The patient denies any easy bruising.  FAMILY HISTORY:  Mother died at 46 with lung cancer, father is alive and well.  He has 3 brothers and 3 sisters, all in good health.  There is no family history of thrombocytopenia, or other blood dyscrasias.  SOCIAL HISTORY:  The patient is married to wife Betty over the last 15 years.  He has one son in good health.  He lives in [place], [place].  Full code.  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  Due to debilitation of the right hand, he is not currently working.  He smoked remotely, quit 25 years ago.  He denies any alcohol or recreational drug use.  He never had a colonoscopy.  His last physical was about 3 months ago.  PHYSICAL EXAMINATION:  GENERAL:  This is an obese 50-year-old white male, in no acute distress.  Alert and oriented x3. VITAL SIGNS:  Blood pressure 145/83, pulse 70, respirations 18, temperature 98, O2 sat 98% on room air.  Weight 126 kg, height 72 inches. HEENT:  Normocephalic, atraumatic.  Sclerae anicteric.  Oral cavity without thrush or lesions. NECK:  Supple.  No cervical or supraclavicular masses. LUNGS:  Essentially clear to auscultation with no wheezing, rhonchi, or rales.  No accessory muscles. CARDIOVASCULAR:  Regular rate and rhythm without murmurs, rubs, or gallops. ABDOMEN:  Soft, nontender.  Bowel sounds x4.  No hepatosplenomegaly. His abdomen is obese. GU AND RECTAL:  Deferred. EXTREMITIES:  No clubbing or cyanosis.  No edema.  No inguinal masses, no bruising or petechial rash. NEURO:  Nonfocal except for his main complaint at the spinal area, which causes debilitation, which is being followed by his surgeon.  LABORATORY DATA:  Hemoglobin 13.7, hematocrit 40, white count 5.6, platelets 82, MCV 86.8, ANC 4.4, lymphocytes 3.4, monocytes 0.8.  PTT 31, PT 15.3, INR 1.18.  Sodium 137, potassium 3.8, BUN 11, creatinine 0.73, glucose 175, total bilirubin 0.6, alkaline phosphatase  73, AST 26 , ALT 36, total protein 7.1, albumin 3.6, calcium 9.6.   I personally reviewed his peripheral blood smear today.  There was isocytosis.  There was no peripheral blast.  There was no schistocytosis, spherocytosis, target cell, rouleaux formation, tear drop cell.  There was no giant platelets or platelet clumps.     I personally reviewed his peripheral blood smear today.  There was isocytosis.  There was no peripheral blast.  The neutrophils appeared low hypogranulated.  There   was no schistocytosis, spherocytosis, target cell, rouleaux formation, tear drop cell.   There was no giant platelets or platelet clumps.      problem list:    #1 symptomatic left septic [place].  #2 personal report a history of hepatitis B. Record is pending.  #3 diabetic mellitus. #4 hypertension.  #5 hyperlipidemia.  #6 chronic thrombocytopenia going on for at least 2 years.   impression:   my differential diagnosis for this patient include hepatitis B versus cirrhosis versus splenomegaly versus statin-induced thrombocytopenia versus less likely chronic  ITP  and the primary bone marrow failure states. However given the  progression nature of his thrombocytopenia; patient reports that it was in the low [address] until now [address]. I cannot rule out primary bone marrow failure problem without further workup for such diagnosis of myelofibrosis and myelodysplastic  syndrome.    recommendations: I recommended outpatient lab work up with me in the next few days for hepatitis B, hep C; HIV, ANA, TSH. Vitamin B12 and folate levels were sent earlier today.   outpatient abdominal ultrasound with Doppler to rule out cirrhosis and  splenomegaly division hepatitis B. Outpatient bone marrow biopsy to rule out primary bone marrow failure states such as myelofibrosis given that he has hypo- granulation of the granuloctyes and progressive  thrombocytopenia. stop statin for now which can  contribute to   thrombocytopenia. if workup is negative the diagnosis of exclusion his ITP and may consider a course of prednisone at that time if his thrombocytopenia worsens significantly.  Even though he did not get a bump of his the counts with  transfusion of the today, I see no evidence of microangiopathic hemolytic anemia such as DIC or TTP on blood smear review today. Hold off on surgery as I discussed Dr. Bean today until  his thrombocytopenia is worked up. I will follow the patient as  outpatient.   thank you for this kind consultation.   Essie Lagunes T Clay Menser, M.D.     HTH/MEDQ  D:  12/13/2010  T:  12/13/2010  Job:  [id number]  cc:   David Swayne, M.D. Fax: [phone]  Electronically Signed by Clinton Dragone MD on 12/14/2010 08:10:47 PM 

## 2011-01-01 NOTE — ED Notes (Signed)
MD at bedside. 

## 2011-01-01 NOTE — ED Notes (Signed)
INCISION TIME 

## 2011-01-01 NOTE — Procedures (Signed)
Successful CT guided bone marrow biopsy.  No immediate complication.  See Radiology report.

## 2011-01-01 NOTE — Interval H&P Note (Signed)
History and Physical Interval Note:   01/01/2011   8:40 AM  Medical history reviewed and am in agreement with that obtained during consultation with Dr. Gaylyn Rong.  Patient has had no change in review of systems and physical exam findings are consistent with those during his consultation as well.    Victor [name]  has presented today for surgery, with the diagnosis of * thrombocytopenia. *  The various methods of treatment have been discussed with the patient and family. After consideration of risks, benefits and other options for treatment, the patient has consented to CT guided bone marrow needle core biopsy to assist with treatment guidelines for thrombocytopenia .  The patients' history has been reviewed, patient examined, no change in status, stable for surgery.  I have reviewed the patients' chart. Labs from today's setting in progress.  Questions were answered to the patient's satisfaction.     CAMPBELL,PAMELA D , PA-C

## 2011-01-10 ENCOUNTER — Other Ambulatory Visit (HOSPITAL_COMMUNITY): Payer: BC Managed Care – PPO

## 2011-01-15 ENCOUNTER — Encounter: Payer: Self-pay | Admitting: Oncology

## 2011-01-15 DIAGNOSIS — R161 Splenomegaly, not elsewhere classified: Secondary | ICD-10-CM | POA: Insufficient documentation

## 2011-01-22 ENCOUNTER — Telehealth: Payer: Self-pay | Admitting: Oncology

## 2011-01-22 ENCOUNTER — Ambulatory Visit (HOSPITAL_BASED_OUTPATIENT_CLINIC_OR_DEPARTMENT_OTHER): Payer: BC Managed Care – PPO | Admitting: Oncology

## 2011-01-22 ENCOUNTER — Other Ambulatory Visit (HOSPITAL_BASED_OUTPATIENT_CLINIC_OR_DEPARTMENT_OTHER): Payer: BC Managed Care – PPO

## 2011-01-22 VITALS — BP 143/83 | HR 75 | Temp 96.9°F | Ht 72.0 in | Wt 275.7 lb

## 2011-01-22 DIAGNOSIS — R161 Splenomegaly, not elsewhere classified: Secondary | ICD-10-CM

## 2011-01-22 DIAGNOSIS — E785 Hyperlipidemia, unspecified: Secondary | ICD-10-CM

## 2011-01-22 DIAGNOSIS — D696 Thrombocytopenia, unspecified: Secondary | ICD-10-CM

## 2011-01-22 DIAGNOSIS — K766 Portal hypertension: Secondary | ICD-10-CM

## 2011-01-22 LAB — CBC WITH DIFFERENTIAL/PLATELET
BASO%: 0.3 % (ref 0.0–2.0)
Basophils Absolute: 0 10*3/uL (ref 0.0–0.1)
EOS%: 1.7 % (ref 0.0–7.0)
Eosinophils Absolute: 0.1 10*3/uL (ref 0.0–0.5)
HCT: 40.1 % (ref 38.4–49.9)
HGB: 13.7 g/dL (ref 13.0–17.1)
LYMPH%: 27.8 % (ref 14.0–49.0)
MCH: 30.1 pg (ref 27.2–33.4)
MCHC: 34.2 g/dL (ref 32.0–36.0)
MCV: 87.9 fL (ref 79.3–98.0)
MONO#: 0.3 10*3/uL (ref 0.1–0.9)
MONO%: 8 % (ref 0.0–14.0)
NEUT#: 2.4 10*3/uL (ref 1.5–6.5)
NEUT%: 62.2 % (ref 39.0–75.0)
Platelets: 59 10*3/uL — ABNORMAL LOW (ref 140–400)
RBC: 4.56 10*6/uL (ref 4.20–5.82)
RDW: 13.9 % (ref 11.0–14.6)
WBC: 3.8 10*3/uL — ABNORMAL LOW (ref 4.0–10.3)
lymph#: 1.1 10*3/uL (ref 0.9–3.3)

## 2011-01-22 NOTE — Telephone Encounter (Signed)
gve the pt his feb,april,june 2013 appt calendar along with the appt to see dr Randa Evens in jan 2013

## 2011-01-22 NOTE — Progress Notes (Signed)
[facility] OFFICE PROGRESS NOTE  Sissy Hoff, MD  DIAGNOSIS:  Thrombocytopenia due to portal hypertension/splenomegaly.   CURRENT THERAPY:  Watchful observation.   INTERVAL HISTORY: Victor [name] 50 y.o. male returns to discuss result of work up for his thrombocytopenia.  He still has moderate to severe lower back pain.  He denies lower extremity weakness or paresthesia.   He denies all bleeding symptoms.  He c/o nocturia without pelvic pain, hematuria.   Patient denies fatigue, headache, visual changes, confusion, drenching night sweats, palpable lymph node swelling, mucositis, odynophagia, dysphagia, nausea vomiting, jaundice, chest pain, palpitation, shortness of breath, dyspnea on exertion, productive cough, gum bleeding, epistaxis, hematemesis, hemoptysis, abdominal pain, abdominal swelling, early satiety, melena, hematochezia, hematuria, skin rash, spontaneous bleeding, joint swelling, joint pain, heat or cold intolerance, bowel bladder incontinence, depression, suicidal or homocidal ideation, feeling hopelessness.   MEDICAL HISTORY: Past Medical History  Diagnosis Date  . Hepatitis B virus infection   . Thrombocytopenia   . Sciatica   . Diabetes mellitus   . Hyperlipidemia   . Hypertension   . GERD (gastroesophageal reflux disease)   . Osteoarthritis   . Tuberculosis   . Anxiety   . Depression   . Splenomegaly     *  Newly diagnosed portal hypertension   SURGICAL HISTORY:  Past Surgical History  Procedure Date  . Hand surgery     right hand  . Shoulder surgery     left shoulder  . Knee surgery     5 surgeries to right knee, 2 surgeries to left knee    MEDICATIONS: Current Outpatient Prescriptions  Medication Sig Dispense Refill  . alprazolam (XANAX) 2 MG tablet Take 2 mg by mouth at bedtime as needed.        Marland Kitchen atenolol (TENORMIN) 25 MG tablet Take 25 mg by mouth every morning.        Marland Kitchen esomeprazole (NEXIUM) 40 MG capsule Take 40 mg by mouth  daily before breakfast.        . fish oil-omega-3 fatty acids 1000 MG capsule Take 1 g by mouth daily.       Marland Kitchen HYDROmorphone (DILAUDID) 8 MG tablet Take 8 mg by mouth every 6 (six) hours as needed. PAIN       . lisinopril (PRINIVIL,ZESTRIL) 2.5 MG tablet Take 2.5 mg by mouth every morning.        . metFORMIN (GLUCOPHAGE) 1000 MG tablet Take 1,000 mg by mouth 2 (two) times daily with a meal.        . selegiline (EMSAM) 6 MG/24HR Place 1 patch onto the skin daily.        . sitaGLIPtin (JANUVIA) 100 MG tablet Take 100 mg by mouth every morning.         ALLERGIES:  is allergic to demerol; flexeril; and vicodin.  REVIEW OF SYSTEMS:  The rest of the 14-point review of system was negative.   Filed Vitals:   01/22/11 1431  BP: 143/83  Pulse: 75  Temp: 96.9 F (36.1 C)   Wt Readings from Last 3 Encounters:  01/22/11 275 lb 11.2 oz (125.057 kg)  01/01/11 280 lb (127.007 kg)   ECOG Performance status: 1 due to his back pain.   PHYSICAL EXAMINATION:   General:  Obese man in no acute distress.  Eyes:  no scleral icterus.  ENT:  There were no oropharyngeal lesions.  Neck was without thyromegaly.  Lymphatics:  Negative cervical, supraclavicular or axillary adenopathy.  Respiratory: lungs were  clear bilaterally without wheezing or crackles.  Cardiovascular:  Regular rate and rhythm, S1/S2, without murmur, rub or gallop.  There was no pedal edema.  GI:  abdomen was soft, flat, nontender, nondistended, without organomegaly.  Muscoloskeletal:  no spinal tenderness of palpation of vertebral spine.  Skin exam was without echymosis, petichae.  Neuro exam was nonfocal.  Patient was able to get on and off exam table without assistance.  Gait was normal.  Patient was alerted and oriented.  Attention was good.   Language was appropriate.  Mood was normal without depression.  Speech was not pressured.  Thought content was not tangential.     LABORATORY/RADIOLOGY DATA:  Lab Results  Component Value Date    WBC 3.8* 01/22/2011   HGB 13.7 01/22/2011   HCT 40.1 01/22/2011   PLT 59* 01/22/2011   GLUCOSE 175* 12/05/2010   ALT 36 12/05/2010   AST 26 12/05/2010   NA 137 12/05/2010   K 3.8 12/05/2010   CL 102 12/05/2010   CREATININE 0.73 12/05/2010   BUN 11 12/05/2010   CO2 26 12/05/2010   TSH 0.813 12/14/2010   TSH 0.813 12/14/2010   INR 1.10 01/01/2011   IMAGINGS:    US abdomen on 12/19/2009 showed splenomegaly and fatty liver infiltration.  There was sign of portal hypertension on doppler US.    PATHOLOGY:  Bone marrow biopsy on 01/01/2011 showed normal cellularity and trilineage hematopoiesis.  There were abundant megakaryocytes.   Cytogenetics:  Normal 46, [XY].   ASSESSMENT AND PLAN:  1.  Thrombocytopenia: due to portal hypertension and splenomegaly. Extensive workup was also negative for active hepatitis currently or HIV or any form of vitamin deficiency to cause his thrombocytopenia.  Bone marrow biopsy was negative for lymphoma/leukemia/MDS/or primary bone marrow failure state.  I discussed with Victor [name] and his wife that the chance of spontaneous hemorrhage with plt of more than 3000 is low; and he does not need platelet transfusion routinely.   However if he needs to have orthopedic procedure, Dr. Jillyn Hidden may consider admitting the patient to the hospital and transfuse him with platelet to bring it up to Dr. Veronda Prude level of comfort.  Given his problem with splenomegaly sequestering his platelet he may require a lot of platelet transfusions to bring up to 100,000 level.  One unit of platelet transfusion two months ago did not improve his plt at all.  Another alternative is to proceed with the procedure and if he develops intraoperative or postoperative bleeding, then at that time to transfuse him with platelets.  2.  Portal hypertension: This is due to his history of hepatitis B about 3 years ago. I referred him back to Dr. Carman Ching to evaluate to see if patient requires an EGD to  check for esophageal varices.  3.  DM:  He is taking metformin, Januvia, and selegiline per PCP.  4.  HTN:  Well-controlled per PCP on atenolol.  5.  hyperlipidemia: I advised him to resume his fish oil and statin given the fact that these medications are the cause of his thrombocytopenia.  6.  Severe back pain:  Due for orthopedic surgery with Dr. Jillyn Hidden in the near future.   7.  Nocturia:  Without pyouria, hematuria:  Most likely BPH.  I advised him to talk with his PCP to see if he is a candidate for alpha blockers.   8.  Followup: Lab only with the [facility] in 2 months and 4 months. I will see him myself in  6 months.

## 2011-01-26 ENCOUNTER — Telehealth: Payer: Self-pay | Admitting: *Deleted

## 2011-01-26 NOTE — Telephone Encounter (Signed)
Pt called requesting results of his Bone Marrow Bx,  Abd U/S be sent to his Ortho, Dr. Jene Every.  He says that Dr. Gaylyn Rong said it would be sent, but that Dr. Ermelinda Das office say they have not received it yet.  Informed pt I will personally fax all the information to Dr. Shelle Iron office and for him to check w/ them in a few hours to make sure they got it.   Faxed recent progress note,  abd u/s report and BMBx path report to Dr. Shelle Iron at [phone].

## 2011-02-05 ENCOUNTER — Ambulatory Visit: Payer: BC Managed Care – PPO | Admitting: Oncology

## 2011-02-05 ENCOUNTER — Other Ambulatory Visit: Payer: BC Managed Care – PPO | Admitting: Lab

## 2011-02-15 ENCOUNTER — Encounter (HOSPITAL_COMMUNITY): Payer: Self-pay | Admitting: *Deleted

## 2011-02-16 ENCOUNTER — Encounter (HOSPITAL_COMMUNITY): Payer: Self-pay

## 2011-02-16 ENCOUNTER — Other Ambulatory Visit: Payer: Self-pay | Admitting: Otolaryngology

## 2011-02-19 ENCOUNTER — Ambulatory Visit (HOSPITAL_COMMUNITY): Payer: BC Managed Care – PPO

## 2011-02-19 ENCOUNTER — Encounter (HOSPITAL_COMMUNITY): Payer: Self-pay | Admitting: Anesthesiology

## 2011-02-19 ENCOUNTER — Encounter (HOSPITAL_COMMUNITY): Payer: Self-pay

## 2011-02-19 ENCOUNTER — Encounter (HOSPITAL_COMMUNITY)
Admission: RE | Disposition: A | Discharge status: Home / Self Care | Payer: Self-pay | Source: Ambulatory Visit | Attending: Specialist

## 2011-02-19 ENCOUNTER — Inpatient Hospital Stay (HOSPITAL_COMMUNITY)
Admission: RE | Admit: 2011-02-19 | Discharge: 2011-02-24 | DRG: 757 | Disposition: A | Discharge status: Home / Self Care | Payer: BC Managed Care – PPO | Source: Ambulatory Visit | Attending: Specialist | Admitting: Specialist

## 2011-02-19 ENCOUNTER — Ambulatory Visit (HOSPITAL_COMMUNITY): Payer: BC Managed Care – PPO | Admitting: Anesthesiology

## 2011-02-19 ENCOUNTER — Other Ambulatory Visit: Payer: Self-pay

## 2011-02-19 DIAGNOSIS — R161 Splenomegaly, not elsewhere classified: Secondary | ICD-10-CM

## 2011-02-19 DIAGNOSIS — B1910 Unspecified viral hepatitis B without hepatic coma: Secondary | ICD-10-CM | POA: Diagnosis present

## 2011-02-19 DIAGNOSIS — F3289 Other specified depressive episodes: Secondary | ICD-10-CM | POA: Diagnosis present

## 2011-02-19 DIAGNOSIS — K219 Gastro-esophageal reflux disease without esophagitis: Secondary | ICD-10-CM | POA: Diagnosis present

## 2011-02-19 DIAGNOSIS — F329 Major depressive disorder, single episode, unspecified: Secondary | ICD-10-CM | POA: Diagnosis present

## 2011-02-19 DIAGNOSIS — E119 Type 2 diabetes mellitus without complications: Secondary | ICD-10-CM | POA: Diagnosis present

## 2011-02-19 DIAGNOSIS — M4800 Spinal stenosis, site unspecified: Secondary | ICD-10-CM

## 2011-02-19 DIAGNOSIS — M48061 Spinal stenosis, lumbar region without neurogenic claudication: Principal | ICD-10-CM | POA: Diagnosis present

## 2011-02-19 DIAGNOSIS — F411 Generalized anxiety disorder: Secondary | ICD-10-CM | POA: Diagnosis present

## 2011-02-19 DIAGNOSIS — D696 Thrombocytopenia, unspecified: Secondary | ICD-10-CM | POA: Diagnosis present

## 2011-02-19 DIAGNOSIS — I129 Hypertensive chronic kidney disease with stage 1 through stage 4 chronic kidney disease, or unspecified chronic kidney disease: Secondary | ICD-10-CM | POA: Diagnosis present

## 2011-02-19 DIAGNOSIS — M5126 Other intervertebral disc displacement, lumbar region: Secondary | ICD-10-CM | POA: Diagnosis present

## 2011-02-19 DIAGNOSIS — M1990 Unspecified osteoarthritis, unspecified site: Secondary | ICD-10-CM | POA: Diagnosis present

## 2011-02-19 DIAGNOSIS — E785 Hyperlipidemia, unspecified: Secondary | ICD-10-CM | POA: Diagnosis present

## 2011-02-19 DIAGNOSIS — N189 Chronic kidney disease, unspecified: Secondary | ICD-10-CM | POA: Diagnosis present

## 2011-02-19 DIAGNOSIS — N390 Urinary tract infection, site not specified: Secondary | ICD-10-CM | POA: Diagnosis not present

## 2011-02-19 HISTORY — DX: Encounter for other specified aftercare: Z51.89

## 2011-02-19 HISTORY — DX: Inflammatory liver disease, unspecified: K75.9

## 2011-02-19 HISTORY — DX: Reserved for inherently not codable concepts without codable children: IMO0001

## 2011-02-19 HISTORY — PX: LUMBAR LAMINECTOMY/DECOMPRESSION MICRODISCECTOMY: SHX5026

## 2011-02-19 LAB — GLUCOSE, CAPILLARY
Glucose-Capillary: 342 mg/dL — ABNORMAL HIGH (ref 70–99)
Glucose-Capillary: 280 mg/dL — ABNORMAL HIGH (ref 70–99)
Glucose-Capillary: 206 mg/dL — ABNORMAL HIGH (ref 70–99)
Glucose-Capillary: 163 mg/dL — ABNORMAL HIGH (ref 70–99)

## 2011-02-19 LAB — TYPE AND SCREEN
ABO/RH(D): A POS
Antibody Screen: NEGATIVE

## 2011-02-19 LAB — PLATELET COUNT: Platelets: 63 10*3/uL — ABNORMAL LOW (ref 150–400)

## 2011-02-19 LAB — COMPREHENSIVE METABOLIC PANEL
ALT: 39 U/L (ref 0–53)
AST: 29 U/L (ref 0–37)
Albumin: 3.9 g/dL (ref 3.5–5.2)
Alkaline Phosphatase: 90 U/L (ref 39–117)
BUN: 14 mg/dL (ref 6–23)
CO2: 25 mEq/L (ref 19–32)
Calcium: 9.5 mg/dL (ref 8.4–10.5)
Chloride: 99 mEq/L (ref 96–112)
Creatinine, Ser: 0.72 mg/dL (ref 0.50–1.35)
GFR calc Af Amer: >90 mL/min (ref >90)
GFR calc non Af Amer: >90 mL/min (ref >90)
Glucose, Bld: 333 mg/dL — ABNORMAL HIGH (ref 70–99)
Potassium: 3.8 mEq/L (ref 3.5–5.1)
Sodium: 135 mEq/L (ref 135–145)
Total Bilirubin: 1.2 mg/dL (ref 0.3–1.2)
Total Protein: 7.6 g/dL (ref 6.0–8.3)

## 2011-02-19 LAB — URINALYSIS, ROUTINE W REFLEX MICROSCOPIC
Bilirubin Urine: NEGATIVE
Glucose, UA: >1000 mg/dL — AB
Hgb urine dipstick: NEGATIVE
Leukocytes, UA: NEGATIVE
Nitrite: NEGATIVE
Protein, ur: NEGATIVE mg/dL
Specific Gravity, Urine: 1.039 — ABNORMAL HIGH (ref 1.005–1.030)
Urobilinogen, UA: 1 mg/dL (ref 0.0–1.0)
pH: 6 (ref 5.0–8.0)

## 2011-02-19 LAB — DIFFERENTIAL
Basophils Absolute: 0 10*3/uL (ref 0.0–0.1)
Basophils Relative: 0 % (ref 0–1)
Eosinophils Absolute: 0.2 10*3/uL (ref 0.0–0.7)
Eosinophils Relative: 3 % (ref 0–5)
Lymphocytes Relative: 27 % (ref 12–46)
Lymphs Abs: 1.4 10*3/uL (ref 0.7–4.0)
Monocytes Absolute: 0.5 10*3/uL (ref 0.1–1.0)
Monocytes Relative: 9 % (ref 3–12)
Neutro Abs: 3.1 10*3/uL (ref 1.7–7.7)
Neutrophils Relative %: 61 % (ref 43–77)

## 2011-02-19 LAB — PROTIME-INR
INR: 1.17 (ref 0.00–1.49)
Prothrombin Time: 15.1 seconds (ref 11.6–15.2)

## 2011-02-19 LAB — URINE MICROSCOPIC-ADD ON

## 2011-02-19 LAB — CBC
HCT: 41.9 % (ref 39.0–52.0)
Hemoglobin: 14.3 g/dL (ref 13.0–17.0)
MCH: 28.7 pg (ref 26.0–34.0)
MCHC: 34.1 g/dL (ref 30.0–36.0)
MCV: 84 fL (ref 78.0–100.0)
Platelets: 60 10*3/uL — ABNORMAL LOW (ref 150–400)
RBC: 4.99 MIL/uL (ref 4.22–5.81)
RDW: 13.2 % (ref 11.5–15.5)
WBC: 5.2 10*3/uL (ref 4.0–10.5)

## 2011-02-19 LAB — SURGICAL PCR SCREEN
MRSA, PCR: NEGATIVE
Staphylococcus aureus: NEGATIVE

## 2011-02-19 SURGERY — LUMBAR LAMINECTOMY/DECOMPRESSION MICRODISCECTOMY
Anesthesia: General | Site: Spine Lumbar | Laterality: Left

## 2011-02-19 MED ORDER — INSULIN ASPART 100 UNIT/ML ~~LOC~~ SOLN
0.0000 [IU] | SUBCUTANEOUS | Status: DC
  Administered 2011-02-19: 8 [IU] via SUBCUTANEOUS
  Administered 2011-02-19: 11 [IU] via SUBCUTANEOUS

## 2011-02-19 MED ORDER — SODIUM CHLORIDE 0.9 % IV SOLN
INTRAVENOUS | Status: DC | PRN
  Administered 2011-02-19: 16:00:00 via INTRAVENOUS

## 2011-02-19 MED ORDER — HYDROMORPHONE HCL PF 1 MG/ML IJ SOLN
INTRAMUSCULAR | Status: AC
  Filled 2011-02-19: qty 1

## 2011-02-19 MED ORDER — METHOCARBAMOL 500 MG PO TABS
500.0000 mg | ORAL_TABLET | Freq: Four times a day (QID) | ORAL | Status: DC | PRN
  Administered 2011-02-21: 500 mg via ORAL
  Filled 2011-02-19: qty 1

## 2011-02-19 MED ORDER — HYDROMORPHONE HCL PF 1 MG/ML IJ SOLN
INTRAMUSCULAR | Status: AC
  Administered 2011-02-19: 1 mg via INTRAVENOUS
  Filled 2011-02-19: qty 1

## 2011-02-19 MED ORDER — HYDROMORPHONE HCL PF 1 MG/ML IJ SOLN
INTRAMUSCULAR | Status: DC | PRN
  Administered 2011-02-19 (×2): 1 mg via INTRAVENOUS

## 2011-02-19 MED ORDER — GLYCOPYRROLATE 0.2 MG/ML IJ SOLN
INTRAMUSCULAR | Status: DC | PRN
  Administered 2011-02-19: .8 mg via INTRAVENOUS

## 2011-02-19 MED ORDER — METHOCARBAMOL 100 MG/ML IJ SOLN
500.0000 mg | Freq: Four times a day (QID) | INTRAVENOUS | Status: DC | PRN
  Administered 2011-02-19 – 2011-02-20 (×2): 500 mg via INTRAVENOUS
  Filled 2011-02-19 (×3): qty 5

## 2011-02-19 MED ORDER — MIDAZOLAM HCL 5 MG/5ML IJ SOLN
INTRAMUSCULAR | Status: DC | PRN
  Administered 2011-02-19: 2 mg via INTRAVENOUS

## 2011-02-19 MED ORDER — CHLORHEXIDINE GLUCONATE 4 % EX LIQD: 60.0000 mL | Freq: Once | CUTANEOUS | Status: DC

## 2011-02-19 MED ORDER — MUPIROCIN 2 % EX OINT
TOPICAL_OINTMENT | CUTANEOUS | Status: AC
  Filled 2011-02-19: qty 22

## 2011-02-19 MED ORDER — HYDROMORPHONE HCL PF 1 MG/ML IJ SOLN
0.2500 mg | INTRAMUSCULAR | Status: DC | PRN
  Administered 2011-02-19 (×4): 0.5 mg via INTRAVENOUS
  Filled 2011-02-19: qty 1

## 2011-02-19 MED ORDER — HYDROMORPHONE HCL PF 1 MG/ML IJ SOLN
0.5000 mg | INTRAMUSCULAR | Status: DC | PRN
  Administered 2011-02-19 – 2011-02-20 (×2): 1 mg via INTRAVENOUS
  Filled 2011-02-19: qty 1

## 2011-02-19 MED ORDER — SELEGILINE 6 MG/24HR TD PT24
6.0000 mg | MEDICATED_PATCH | Freq: Every evening | TRANSDERMAL | Status: DC
  Administered 2011-02-21 – 2011-02-23 (×3): 6 mg via TRANSDERMAL
  Filled 2011-02-19 (×7): qty 1

## 2011-02-19 MED ORDER — OXYCODONE-ACETAMINOPHEN 5-325 MG PO TABS
ORAL_TABLET | ORAL | Status: AC
  Administered 2011-02-19: 1 via ORAL
  Filled 2011-02-19: qty 1

## 2011-02-19 MED ORDER — SODIUM CHLORIDE 0.9 % IJ SOLN
3.0000 mL | Freq: Two times a day (BID) | INTRAMUSCULAR | Status: DC
  Administered 2011-02-20 (×2): 3 mL via INTRAVENOUS

## 2011-02-19 MED ORDER — CEFAZOLIN SODIUM-DEXTROSE 2-3 GM-% IV SOLR
INTRAVENOUS | Status: AC
Start: 2011-02-19 — End: 2011-02-19
  Filled 2011-02-19: qty 50

## 2011-02-19 MED ORDER — SODIUM CHLORIDE 0.9 % IV SOLN: 250.0000 mL | INTRAVENOUS | Status: DC

## 2011-02-19 MED ORDER — NEOSTIGMINE METHYLSULFATE 1 MG/ML IJ SOLN
INTRAMUSCULAR | Status: DC | PRN
  Administered 2011-02-19: 5 mg via INTRAVENOUS

## 2011-02-19 MED ORDER — LIDOCAINE HCL (CARDIAC) 20 MG/ML IV SOLN
INTRAVENOUS | Status: DC | PRN
  Administered 2011-02-19: 100 mg via INTRAVENOUS

## 2011-02-19 MED ORDER — BUPIVACAINE-EPINEPHRINE (PF) 0.5% -1:200000 IJ SOLN
INTRAMUSCULAR | Status: AC
  Filled 2011-02-19: qty 10

## 2011-02-19 MED ORDER — INSULIN ASPART 100 UNIT/ML ~~LOC~~ SOLN
SUBCUTANEOUS | Status: AC
  Administered 2011-02-19: 11 [IU] via SUBCUTANEOUS
  Filled 2011-02-19: qty 3

## 2011-02-19 MED ORDER — SUCCINYLCHOLINE CHLORIDE 20 MG/ML IJ SOLN
INTRAMUSCULAR | Status: DC | PRN
  Administered 2011-02-19: 100 mg via INTRAVENOUS

## 2011-02-19 MED ORDER — HYDROMORPHONE HCL 2 MG PO TABS
8.0000 mg | ORAL_TABLET | ORAL | Status: DC | PRN
  Administered 2011-02-19 – 2011-02-24 (×20): 8 mg via ORAL
  Filled 2011-02-19 (×20): qty 4

## 2011-02-19 MED ORDER — FENTANYL CITRATE 0.05 MG/ML IJ SOLN
INTRAMUSCULAR | Status: DC | PRN
  Administered 2011-02-19: 100 ug via INTRAVENOUS
  Administered 2011-02-19: 150 ug via INTRAVENOUS

## 2011-02-19 MED ORDER — BUPIVACAINE-EPINEPHRINE 0.5% -1:200000 IJ SOLN
INTRAMUSCULAR | Status: DC | PRN
  Administered 2011-02-19: 15 mL

## 2011-02-19 MED ORDER — LACTATED RINGERS IV SOLN: INTRAVENOUS | Status: DC

## 2011-02-19 MED ORDER — PANTOPRAZOLE SODIUM 40 MG PO TBEC
80.0000 mg | DELAYED_RELEASE_TABLET | Freq: Every day | ORAL | Status: DC
  Administered 2011-02-20 – 2011-02-23 (×4): 80 mg via ORAL
  Filled 2011-02-19 (×6): qty 2

## 2011-02-19 MED ORDER — SODIUM CHLORIDE 0.9 % IJ SOLN: 3.0000 mL | INTRAMUSCULAR | Status: DC | PRN

## 2011-02-19 MED ORDER — CEFAZOLIN SODIUM-DEXTROSE 2-3 GM-% IV SOLR
2.0000 g | INTRAVENOUS | Status: AC
  Administered 2011-02-19: 2 g via INTRAVENOUS

## 2011-02-19 MED ORDER — ATENOLOL 25 MG PO TABS
25.0000 mg | ORAL_TABLET | Freq: Every day | ORAL | Status: DC
  Administered 2011-02-20 – 2011-02-24 (×5): 25 mg via ORAL
  Filled 2011-02-19 (×6): qty 1

## 2011-02-19 MED ORDER — SODIUM CHLORIDE 0.9 % IV SOLN
INTRAVENOUS | Status: DC
  Administered 2011-02-19 – 2011-02-20 (×2): via INTRAVENOUS

## 2011-02-19 MED ORDER — DOCUSATE SODIUM 100 MG PO CAPS
100.0000 mg | ORAL_CAPSULE | Freq: Two times a day (BID) | ORAL | Status: DC
  Administered 2011-02-20 – 2011-02-24 (×9): 100 mg via ORAL
  Filled 2011-02-19 (×13): qty 1

## 2011-02-19 MED ORDER — ONDANSETRON HCL 4 MG/2ML IJ SOLN: 4.0000 mg | INTRAMUSCULAR | Status: DC | PRN

## 2011-02-19 MED ORDER — LACTATED RINGERS IV SOLN
INTRAVENOUS | Status: DC | PRN
  Administered 2011-02-19: 16:00:00 via INTRAVENOUS

## 2011-02-19 MED ORDER — INSULIN ASPART 100 UNIT/ML ~~LOC~~ SOLN
0.0000 [IU] | Freq: Three times a day (TID) | SUBCUTANEOUS | Status: DC
  Administered 2011-02-20: 5 [IU] via SUBCUTANEOUS
  Administered 2011-02-20 (×2): 8 [IU] via SUBCUTANEOUS
  Administered 2011-02-21: 5 [IU] via SUBCUTANEOUS
  Filled 2011-02-19: qty 3

## 2011-02-19 MED ORDER — PHENOL 1.4 % MT LIQD: 1.0000 | OROMUCOSAL | Status: DC | PRN

## 2011-02-19 MED ORDER — CEFAZOLIN SODIUM-DEXTROSE 2-3 GM-% IV SOLR
2.0000 g | Freq: Three times a day (TID) | INTRAVENOUS | Status: AC
  Administered 2011-02-19 – 2011-02-20 (×2): 2 g via INTRAVENOUS
  Filled 2011-02-19 (×2): qty 50

## 2011-02-19 MED ORDER — PROPOFOL 10 MG/ML IV EMUL
INTRAVENOUS | Status: DC | PRN
  Administered 2011-02-19: 200 mg via INTRAVENOUS

## 2011-02-19 MED ORDER — THROMBIN 5000 UNITS EX SOLR
CUTANEOUS | Status: AC
Start: 2011-02-19 — End: 2011-02-19
  Filled 2011-02-19: qty 10000

## 2011-02-19 MED ORDER — LINAGLIPTIN 5 MG PO TABS
5.0000 mg | ORAL_TABLET | Freq: Every day | ORAL | Status: DC
  Administered 2011-02-20 – 2011-02-24 (×5): 5 mg via ORAL
  Filled 2011-02-19 (×8): qty 1

## 2011-02-19 MED ORDER — ROCURONIUM BROMIDE 100 MG/10ML IV SOLN
INTRAVENOUS | Status: DC | PRN
  Administered 2011-02-19: 10 mg via INTRAVENOUS
  Administered 2011-02-19: 50 mg via INTRAVENOUS

## 2011-02-19 MED ORDER — ACETAMINOPHEN 325 MG PO TABS
650.0000 mg | ORAL_TABLET | ORAL | Status: DC | PRN
  Administered 2011-02-20 – 2011-02-23 (×6): 650 mg via ORAL
  Filled 2011-02-19 (×6): qty 2

## 2011-02-19 MED ORDER — SODIUM CHLORIDE 0.9 % IR SOLN
Status: DC | PRN
  Administered 2011-02-19: 17:00:00

## 2011-02-19 MED ORDER — ACETAMINOPHEN 650 MG RE SUPP: 650.0000 mg | RECTAL | Status: DC | PRN

## 2011-02-19 MED ORDER — THROMBIN 5000 UNITS EX SOLR
CUTANEOUS | Status: DC | PRN
  Administered 2011-02-19: 5000 [IU] via TOPICAL

## 2011-02-19 MED ORDER — INSULIN ASPART 100 UNIT/ML ~~LOC~~ SOLN
SUBCUTANEOUS | Status: AC
  Filled 2011-02-19: qty 1

## 2011-02-19 MED ORDER — PROMETHAZINE HCL 25 MG/ML IJ SOLN: 6.2500 mg | INTRAMUSCULAR | Status: DC | PRN

## 2011-02-19 MED ORDER — ONDANSETRON HCL 4 MG/2ML IJ SOLN
INTRAMUSCULAR | Status: DC | PRN
  Administered 2011-02-19: 4 mg via INTRAVENOUS

## 2011-02-19 MED ORDER — MENTHOL 3 MG MT LOZG: 1.0000 | LOZENGE | OROMUCOSAL | Status: DC | PRN

## 2011-02-19 MED ORDER — METFORMIN HCL 500 MG PO TABS
1000.0000 mg | ORAL_TABLET | Freq: Two times a day (BID) | ORAL | Status: DC
  Administered 2011-02-20 – 2011-02-24 (×9): 1000 mg via ORAL
  Filled 2011-02-19 (×12): qty 2

## 2011-02-19 MED ORDER — ALPRAZOLAM 1 MG PO TABS
2.0000 mg | ORAL_TABLET | Freq: Three times a day (TID) | ORAL | Status: DC | PRN
  Administered 2011-02-20 – 2011-02-22 (×2): 2 mg via ORAL
  Filled 2011-02-19 (×2): qty 2

## 2011-02-19 MED ORDER — MORPHINE SULFATE CR 30 MG PO TB12
30.0000 mg | ORAL_TABLET | Freq: Two times a day (BID) | ORAL | Status: DC
  Administered 2011-02-20 – 2011-02-21 (×2): 30 mg via ORAL
  Filled 2011-02-19 (×6): qty 1

## 2011-02-19 SURGICAL SUPPLY — 49 items
BAG ZIPLOCK 12X15 (MISCELLANEOUS) ×2 IMPLANT
BENZOIN TINCTURE PRP APPL 2/3 (GAUZE/BANDAGES/DRESSINGS) ×4 IMPLANT
CHLORAPREP W/TINT 26ML (MISCELLANEOUS) IMPLANT
CLEANER TIP ELECTROSURG 2X2 (MISCELLANEOUS) ×2 IMPLANT
CLOTH BEACON ORANGE TIMEOUT ST (SAFETY) ×2 IMPLANT
DECANTER SPIKE VIAL GLASS SM (MISCELLANEOUS) ×2 IMPLANT
DRAPE MICROSCOPE LEICA (MISCELLANEOUS) ×2 IMPLANT
DRAPE POUCH INSTRU U-SHP 10X18 (DRAPES) ×2 IMPLANT
DRAPE SURG 17X11 SM STRL (DRAPES) ×2 IMPLANT
DRSG EMULSION OIL 3X3 NADH (GAUZE/BANDAGES/DRESSINGS) ×2 IMPLANT
DRSG PAD ABDOMINAL 8X10 ST (GAUZE/BANDAGES/DRESSINGS) ×2 IMPLANT
DRSG TELFA 4X5 ISLAND ADH (GAUZE/BANDAGES/DRESSINGS) IMPLANT
DURAPREP 26ML APPLICATOR (WOUND CARE) ×2 IMPLANT
ELECT BLADE TIP CTD 4 INCH (ELECTRODE) ×2 IMPLANT
ELECT REM PT RETURN 9FT ADLT (ELECTROSURGICAL) ×2
ELECTRODE REM PT RTRN 9FT ADLT (ELECTROSURGICAL) ×1 IMPLANT
GLOVE BIOGEL PI IND STRL 6.5 (GLOVE) ×1 IMPLANT
GLOVE BIOGEL PI IND STRL 8 (GLOVE) ×1 IMPLANT
GLOVE BIOGEL PI INDICATOR 6.5 (GLOVE) ×1
GLOVE BIOGEL PI INDICATOR 8 (GLOVE) ×1
GLOVE ECLIPSE 6.5 STRL STRAW (GLOVE) ×2 IMPLANT
GLOVE INDICATOR 6.5 STRL GRN (GLOVE) ×2 IMPLANT
GLOVE SURG SS PI 8.0 STRL IVOR (GLOVE) ×4 IMPLANT
KIT BASIN OR (CUSTOM PROCEDURE TRAY) ×2 IMPLANT
KIT POSITIONING SURG ANDREWS (MISCELLANEOUS) ×2 IMPLANT
MANIFOLD NEPTUNE II (INSTRUMENTS) ×2 IMPLANT
NEEDLE SPNL 18GX3.5 QUINCKE PK (NEEDLE) ×6 IMPLANT
PATTIES SURGICAL .5 X.5 (GAUZE/BANDAGES/DRESSINGS) IMPLANT
PATTIES SURGICAL .75X.75 (GAUZE/BANDAGES/DRESSINGS) IMPLANT
PATTIES SURGICAL 1X1 (DISPOSABLE) IMPLANT
SPONGE GAUZE 4X4 12PLY (GAUZE/BANDAGES/DRESSINGS) ×2 IMPLANT
SPONGE SURGIFOAM ABS GEL 100 (HEMOSTASIS) ×2 IMPLANT
STAPLER VISISTAT (STAPLE) ×2 IMPLANT
STRIP CLOSURE SKIN 1/2X4 (GAUZE/BANDAGES/DRESSINGS) IMPLANT
SUT PROLENE 3 0 PS 2 (SUTURE) IMPLANT
SUT VIC AB 0 CT1 27 (SUTURE)
SUT VIC AB 0 CT1 27XBRD ANTBC (SUTURE) IMPLANT
SUT VIC AB 1 CT1 27 (SUTURE) ×1
SUT VIC AB 1 CT1 27XBRD ANTBC (SUTURE) ×1 IMPLANT
SUT VIC AB 1-0 CT2 27 (SUTURE) IMPLANT
SUT VIC AB 2-0 CT1 27 (SUTURE) ×1
SUT VIC AB 2-0 CT1 TAPERPNT 27 (SUTURE) ×1 IMPLANT
SUT VIC AB 2-0 CT2 27 (SUTURE) ×4 IMPLANT
SUT VICRYL 0 27 CT2 27 ABS (SUTURE) ×4 IMPLANT
SUT VICRYL 0 UR6 27IN ABS (SUTURE) IMPLANT
SYRINGE 10CC LL (SYRINGE) ×4 IMPLANT
TAPE HYPAFIX 4 X10 (GAUZE/BANDAGES/DRESSINGS) ×2 IMPLANT
TRAY LAMINECTOMY (CUSTOM PROCEDURE TRAY) ×2 IMPLANT
YANKAUER SUCT BULB TIP NO VENT (SUCTIONS) ×2 IMPLANT

## 2011-02-19 NOTE — Transfer of Care (Signed)
Immediate Anesthesia Transfer of Care Note  Patient: Victor [name]  Procedure(s) Performed:  LUMBAR LAMINECTOMY/DECOMPRESSION MICRODISCECTOMY - decompression l5-s1 l4-5 on left  Patient Location: PACU  Anesthesia Type: General  Level of Consciousness: awake, alert  and oriented  Airway & Oxygen Therapy: Patient Spontanous Breathing and Patient connected to face mask oxygen  Post-op Assessment: Report given to PACU RN and Post -op Vital signs reviewed and stable  Post vital signs: Reviewed and stable  Complications: No apparent anesthesia complications

## 2011-02-19 NOTE — Brief Op Note (Signed)
02/19/2011  6:04 PM  PATIENT:  [name]  51 y.o. male  PRE-OPERATIVE DIAGNOSIS:  herniated disc on left L4-5, L5-S1  POST-OPERATIVE DIAGNOSIS:  herniated disc on left L4-5, L5-S1  PROCEDURE:  Procedure(s): LUMBAR LAMINECTOMY/DECOMPRESSION MICRODISCECTOMY  SURGEON:  Surgeon(s): Javier Docker  PHYSICIAN ASSISTANT:   ASSISTANTS: Strader   ANESTHESIA:   general  EBL:  Total I/O In: 995 [I.V.:800; Blood:195] Out: 50 [Blood:50]  BLOOD ADMINISTERED:none  DRAINS: none   LOCAL MEDICATIONS USED:  MARCAINE 20CC  SPECIMEN:  No Specimen  DISPOSITION OF SPECIMEN:  N/A  COUNTS:  YES  TOURNIQUET:  * No tourniquets in log *  DICTATION: .Other Dictation: Dictation Number [id number]  PLAN OF CARE: Admit for overnight observation  PATIENT DISPOSITION:  PACU - hemodynamically stable.   Delay start of Pharmacological VTE agent (>24hrs) due to surgical blood loss or risk of bleeding:  {YES/NO/NOT APPLICABLE:20182

## 2011-02-19 NOTE — Progress Notes (Signed)
Ascom # for  Dr Council Mechanic continuously busy. Page per beeper for CBG of [phone]

## 2011-02-19 NOTE — Anesthesia Preprocedure Evaluation (Addendum)
Anesthesia Evaluation  Patient identified by MRN, date of birth, ID band Patient awake    Reviewed: Allergy & Precautions, H&P , NPO status , Patient's Chart, lab work & pertinent test results, reviewed documented beta blocker date and time   History of Anesthesia Complications Negative for: history of anesthetic complications  Airway Mallampati: II TM Distance: >3 FB Neck ROM: Limited    Dental No notable dental hx. (+) Teeth Intact, Poor Dentition and Dental Advisory Given   Pulmonary neg pulmonary ROS, Recent URI ,  clear to auscultation  Pulmonary exam normal       Cardiovascular hypertension, Pt. on medications and Pt. on home beta blockers Regular Normal    Neuro/Psych Anxiety Depression Chronic low back pain  Neuromuscular disease Negative Neurological ROS  Negative Psych ROS   GI/Hepatic negative GI ROS, Neg liver ROS, GERD-  ,(+) Hepatitis -, BPortal hypertension per ultrasound with splenomegaly..   Endo/Other  Negative Endocrine ROSDiabetes mellitus-, Poorly Controlled, Type 2, Oral Hypoglycemic Agents  Renal/GU negative Renal ROS  Genitourinary negative   Musculoskeletal negative musculoskeletal ROS (+)   Abdominal (+) obese,   Peds  Hematology negative hematology ROS (+) Thrombocytopenia, splenomegaly; w/u per hematology and cleared for procedure per surgeon.   Anesthesia Other Findings   Reproductive/Obstetrics negative OB ROS                         Anesthesia Physical Anesthesia Plan  ASA: III  Anesthesia Plan: General   Post-op Pain Management:    Induction: Intravenous  Airway Management Planned: Oral ETT  Additional Equipment:   Intra-op Plan:   Post-operative Plan: Extubation in OR  Informed Consent: I have reviewed the patients History and Physical, chart, labs and discussed the procedure including the risks, benefits and alternatives for the proposed anesthesia  with the patient or authorized representative who has indicated his/her understanding and acceptance.   Dental advisory given  Plan Discussed with: CRNA  Anesthesia Plan Comments:         Anesthesia Quick Evaluation

## 2011-02-19 NOTE — Progress Notes (Signed)
CXR Oct 2012, EKG repeated today

## 2011-02-19 NOTE — H&P (Signed)
Victor [name] is an 51 y.o. male.   Chief Complaint: left leg pain HPI: left leg HNP Stenosis  Past Medical History  Diagnosis Date  . Hepatitis B virus infection   . Thrombocytopenia   . Sciatica   . Diabetes mellitus   . Hyperlipidemia   . Hypertension   . GERD (gastroesophageal reflux disease)   . Osteoarthritis   . Anxiety   . Depression   . Recurrent upper respiratory infection (URI)     head cold last week- no fever- states resolved  . Blood transfusion     platelets  . Chronic kidney disease     kidney stones 2012  . Hepatitis     hepatitis b/ newly diagnosed with portal hypertension  . Splenomegaly     LOV Dr Gaylyn Rong  12/12 on chart    Past Surgical History  Procedure Date  . Hand surgery     right hand  . Shoulder surgery     left shoulder  . Knee surgery     5 surgeries to right knee, 2 surgeries to left knee    History reviewed. No pertinent family history. Social History:  reports that he quit smoking about 25 years ago. He does not have any smokeless tobacco history on file. He reports that he does not drink alcohol or use illicit drugs.  Allergies:  Allergies  Allergen Reactions  . Demerol (Meperidine Hcl) Other (See Comments)    REACT TO ANOTHER MEDICATION PT IS TAKEN  . Flexeril (Cyclobenzaprine Hcl) Other (See Comments)    REACT TO ANOTHER MEDICATION PT IS TAKEN  . Vicodin (Hydrocodone-Acetaminophen) Other (See Comments)    PT TURN RED AND GETS HOT    Medications Prior to Admission  Medication Dose Route Frequency Provider Last Rate Last Dose  . ceFAZolin (ANCEF) IVPB 2 g/50 mL premix  2 g Intravenous 60 min Pre-Op Liam Graham, PA      . chlorhexidine (HIBICLENS) 4 % liquid 4 application  60 mL Topical Once Liam Graham, PA      . insulin aspart (novoLOG) injection 0-15 Units  0-15 Units Subcutaneous Q4H Marshelle Bilger C Leather Estis   11 Units at 02/19/11 1349  . lactated ringers infusion   Intravenous Continuous Liam Graham, PA        . mupirocin ointment (BACTROBAN) 2 %           . oxyCODONE-acetaminophen (PERCOCET) 5-325 MG per tablet        1 tablet at 02/19/11 1521  . DISCONTD: mupirocin ointment (BACTROBAN) 2 %            Medications Prior to Admission  Medication Sig Dispense Refill  . alprazolam (XANAX) 2 MG tablet Take 2 mg by mouth 3 (three) times daily as needed. For anxiety       . atenolol (TENORMIN) 25 MG tablet Take 25 mg by mouth daily before breakfast.       . esomeprazole (NEXIUM) 40 MG capsule Take 40 mg by mouth daily before breakfast.       . fish oil-omega-3 fatty acids 1000 MG capsule Take 1 g by mouth daily.       Marland Kitchen HYDROmorphone (DILAUDID) 8 MG tablet Take 8 mg by mouth every 6 (six) hours.        . metFORMIN (GLUCOPHAGE) 1000 MG tablet Take 1,000 mg by mouth 2 (two) times daily with a meal.       . selegiline (EMSAM) 6 MG/24HR Place 1 patch  onto the skin every evening.       . sitaGLIPtin (JANUVIA) 100 MG tablet Take 100 mg by mouth daily.         Results for orders placed during the hospital encounter of 02/19/11 (from the past 48 hour(s))  URINALYSIS, ROUTINE W REFLEX MICROSCOPIC     Status: Abnormal   Collection Time   02/19/11  1:05 PM      Component Value Range Comment   Color, Urine AMBER (*) YELLOW  BIOCHEMICALS MAY BE AFFECTED BY COLOR   APPearance CLEAR  CLEAR     Specific Gravity, Urine 1.039 (*) 1.005 - 1.030     pH 6.0  5.0 - 8.0     Glucose, UA >1000 (*) NEGATIVE (mg/dL)    Hgb urine dipstick NEGATIVE  NEGATIVE     Bilirubin Urine NEGATIVE  NEGATIVE     Ketones, ur TRACE (*) NEGATIVE (mg/dL)    Protein, ur NEGATIVE  NEGATIVE (mg/dL)    Urobilinogen, UA 1.0  0.0 - 1.0 (mg/dL)    Nitrite NEGATIVE  NEGATIVE     Leukocytes, UA NEGATIVE  NEGATIVE    URINE MICROSCOPIC-ADD ON     Status: Abnormal   Collection Time   02/19/11  1:05 PM      Component Value Range Comment   Squamous Epithelial / LPF FEW (*) RARE     Bacteria, UA FEW (*) RARE     Urine-Other MUCOUS PRESENT      GLUCOSE, CAPILLARY     Status: Abnormal   Collection Time   02/19/11  1:23 PM      Component Value Range Comment   Glucose-Capillary 342 (*) 70 - 99 (mg/dL)    Comment 1 Call MD NNP PA CNM     CBC     Status: Abnormal   Collection Time   02/19/11  1:25 PM      Component Value Range Comment   WBC 5.2  4.0 - 10.5 (K/uL)    RBC 4.99  4.22 - 5.81 (MIL/uL)    Hemoglobin 14.3  13.0 - 17.0 (g/dL)    HCT 16.1  09.6 - 04.5 (%)    MCV 84.0  78.0 - 100.0 (fL)    MCH 28.7  26.0 - 34.0 (pg)    MCHC 34.1  30.0 - 36.0 (g/dL)    RDW 40.9  81.1 - 91.4 (%)    Platelets 60 (*) 150 - 400 (K/uL)   COMPREHENSIVE METABOLIC PANEL     Status: Abnormal   Collection Time   02/19/11  1:25 PM      Component Value Range Comment   Sodium 135  135 - 145 (mEq/L)    Potassium 3.8  3.5 - 5.1 (mEq/L)    Chloride 99  96 - 112 (mEq/L)    CO2 25  19 - 32 (mEq/L)    Glucose, Bld 333 (*) 70 - 99 (mg/dL)    BUN 14  6 - 23 (mg/dL)    Creatinine, Ser 7.82  0.50 - 1.35 (mg/dL)    Calcium 9.5  8.4 - 10.5 (mg/dL)    Total Protein 7.6  6.0 - 8.3 (g/dL)    Albumin 3.9  3.5 - 5.2 (g/dL)    AST 29  0 - 37 (U/L)    ALT 39  0 - 53 (U/L)    Alkaline Phosphatase 90  39 - 117 (U/L)    Total Bilirubin 1.2  0.3 - 1.2 (mg/dL)    GFR calc non Af  Amer >90  >90 (mL/min)    GFR calc Af Amer >90  >90 (mL/min)   DIFFERENTIAL     Status: Normal   Collection Time   02/19/11  1:25 PM      Component Value Range Comment   Neutrophils Relative 61  43 - 77 (%)    Lymphocytes Relative 27  12 - 46 (%)    Monocytes Relative 9  3 - 12 (%)    Eosinophils Relative 3  0 - 5 (%)    Basophils Relative 0  0 - 1 (%)    Neutro Abs 3.1  1.7 - 7.7 (K/uL)    Lymphs Abs 1.4  0.7 - 4.0 (K/uL)    Monocytes Absolute 0.5  0.1 - 1.0 (K/uL)    Eosinophils Absolute 0.2  0.0 - 0.7 (K/uL)    Basophils Absolute 0.0  0.0 - 0.1 (K/uL)    Smear Review PLATELET COUNT CONFIRMED BY SMEAR     PROTIME-INR     Status: Normal   Collection Time   02/19/11  1:25 PM       Component Value Range Comment   Prothrombin Time 15.1  11.6 - 15.2 (seconds)    INR 1.17  0.00 - 1.49     Dg Lumbar Spine 2-3 Views  02/19/2011  *RADIOLOGY REPORT*  Clinical Data: Preoperative for back surgery.  Pain and tingling and numbness, extending down the left leg.  LUMBAR SPINE - 2-3 VIEW  Comparison: Multiple exams, including 12/05/2010 and 08/09/2010  Findings: Stable multilevel intervertebral spurring noted, with unchanged 3 mm of posterior subluxation of L4 on L5, and mild loss of intervertebral disc height at L5-S1.  A mildly transitional s one vertebra is observed.  The overall appearance is similar to the prior exam from 12/05/2010.  IMPRESSION:  1.  Lumbar spondylosis, with mild subluxation at L4-5.  Original Report Authenticated By: Dellia Cloud, M.D.    Review of Systems  Constitutional: Negative.   HENT: Negative.   Eyes: Negative.   Respiratory: Negative.   Cardiovascular: Negative.   Gastrointestinal: Negative.   Genitourinary: Negative.   Musculoskeletal: Positive for back pain.  Skin: Negative.   Neurological: Positive for tingling.  Endo/Heme/Allergies: Negative.   Psychiatric/Behavioral: Negative.     Blood pressure 135/70, pulse 67, temperature 97.7 F (36.5 C), temperature source Oral, resp. rate 18, height 6' (1.829 m), weight 117.482 kg (259 lb), SpO2 98.00%. Physical Exam  Constitutional: He appears well-developed.  HENT:  Head: Normocephalic.  Eyes: Pupils are equal, round, and reactive to light.  Neck: Normal range of motion.  Cardiovascular: Normal rate.   Respiratory: Effort normal.  GI: Soft.  Musculoskeletal: He exhibits tenderness.  Neurological: He displays abnormal reflex.       +SLR left. EHL4/5. -DVT. 2+pulses.  Skin: Skin is warm.  Psychiatric: He has a normal mood and affect.     Assessment/Plan Left leg pain due to Stenosis L5S1, L45. Plan decompression. Risks discussed. PLT transfusion.  Gareld Obrecht C 02/19/2011, 3:49  PM

## 2011-02-19 NOTE — Preoperative (Signed)
Beta Blockers   Reason not to administer Beta Blockers:pt stated he took 25mg  atenolol this am

## 2011-02-19 NOTE — Anesthesia Postprocedure Evaluation (Signed)
Anesthesia Post Note  Patient: Victor [name]  Procedure(s) Performed:  LUMBAR LAMINECTOMY/DECOMPRESSION MICRODISCECTOMY - decompression l5-s1 l4-5 on left  Anesthesia type: General  Patient location: PACU  Post pain: Pain level controlled  Post assessment: Post-op Vital signs reviewed  Last Vitals:  Filed Vitals:   02/19/11 1845  BP: 146/87  Pulse:   Temp:   Resp: 11    Post vital signs: Reviewed  Level of consciousness: sedated  Complications: No apparent anesthesia complications

## 2011-02-20 LAB — PREPARE PLATELET PHERESIS
Unit division: 0
Unit division: 0

## 2011-02-20 LAB — CBC
HCT: 41.1 % (ref 39.0–52.0)
Hemoglobin: 13.9 g/dL (ref 13.0–17.0)
MCH: 28.9 pg (ref 26.0–34.0)
MCHC: 33.8 g/dL (ref 30.0–36.0)
MCV: 85.4 fL (ref 78.0–100.0)
Platelets: 68 10*3/uL — ABNORMAL LOW (ref 150–400)
RBC: 4.81 MIL/uL (ref 4.22–5.81)
RDW: 13.4 % (ref 11.5–15.5)
WBC: 8.1 10*3/uL (ref 4.0–10.5)

## 2011-02-20 LAB — GLUCOSE, CAPILLARY
Glucose-Capillary: 218 mg/dL — ABNORMAL HIGH (ref 70–99)
Glucose-Capillary: 279 mg/dL — ABNORMAL HIGH (ref 70–99)
Glucose-Capillary: 291 mg/dL — ABNORMAL HIGH (ref 70–99)
Glucose-Capillary: 256 mg/dL — ABNORMAL HIGH (ref 70–99)
Glucose-Capillary: 205 mg/dL — ABNORMAL HIGH (ref 70–99)

## 2011-02-20 MED ORDER — TEMAZEPAM 15 MG PO CAPS: 15.0000 mg | ORAL_CAPSULE | Freq: Every evening | ORAL | Status: DC | PRN

## 2011-02-20 NOTE — Progress Notes (Addendum)
Inpatient Diabetes Program Recommendations  AACE/ADA: New Consensus Statement on Inpatient Glycemic Control (2009)  Target Ranges:  Prepandial:   less than 140 mg/dL      Peak postprandial:   less than 180 mg/dL (1-2 hours)      Critically ill patients:  140 - 180 mg/dL   Reason for Visit: Hyperglycemia  Inpatient Diabetes Program Recommendations Correction (SSI): Increase to resistant tidwc and add HS correction scale please.  Note: Please check HgBA1C for control at home. Thank you, Ledell Noss, CNS [phone])

## 2011-02-20 NOTE — Progress Notes (Signed)
Physical Therapy Evaluation Patient Details Name: Victor [name] MRN: [id number] DOB: 04/05/1960 Today's Date: 02/20/2011 [id number] Ev2 Discharge Recommendations: No equipment needs or follow up PT needs at this time  Problem List:  Patient Active Problem List  Diagnoses  . Hepatitis B virus infection  . Thrombocytopenia  . Sciatica  . Diabetes mellitus  . Hyperlipidemia  . Splenomegaly    Past Medical History:  Past Medical History  Diagnosis Date  . Hepatitis B virus infection   . Thrombocytopenia   . Sciatica   . Diabetes mellitus   . Hyperlipidemia   . Hypertension   . GERD (gastroesophageal reflux disease)   . Osteoarthritis   . Anxiety   . Depression   . Recurrent upper respiratory infection (URI)     head cold last week- no fever- states resolved  . Blood transfusion     platelets  . Chronic kidney disease     kidney stones 2012  . Hepatitis     hepatitis b/ newly diagnosed with portal hypertension  . Splenomegaly     LOV Dr Gaylyn Rong  12/12 on chart   Past Surgical History:  Past Surgical History  Procedure Date  . Hand surgery     right hand  . Shoulder surgery     left shoulder  . Knee surgery     5 surgeries to right knee, 2 surgeries to left knee    PT Assessment/Plan/Recommendation PT Assessment Clinical Impression Statement: Patient s/p L4/5; L5/S1 lumber decompression and microdiscectomy presents with decreased independence with mobility with back precautions and will benefit from skilled PT to maximize independence and allow for d/c home with wife assist in pm only.  Likely won't need post acute therapy services. PT Recommendation/Assessment: Patient will need skilled PT in the acute care venue PT Problem List: Decreased mobility;Decreased knowledge of use of DME;Decreased knowledge of precautions Barriers to Discharge: Decreased caregiver support PT Therapy Diagnosis : Abnormality of gait;Generalized weakness PT Plan PT Frequency: Min 6X/week PT  Treatment/Interventions: DME instruction;Gait training;Stair training;Functional mobility training;Therapeutic activities;Patient/family education PT Recommendation Follow Up Recommendations: No PT follow up Equipment Recommended: None recommended by PT PT Goals  Acute Rehab PT Goals PT Goal Formulation: With patient Time For Goal Achievement: 5 days Pt will Roll Supine to Right Side: with modified independence PT Goal: Rolling Supine to Right Side - Progress: Not met Pt will go Supine/Side to Sit: with modified independence PT Goal: Supine/Side to Sit - Progress: Not met Pt will go Sit to Supine/Side: with modified independence PT Goal: Sit to Supine/Side - Progress: Not met Pt will go Sit to Stand: with modified independence PT Goal: Sit to Stand - Progress: Not met Pt will go Stand to Sit: with modified independence PT Goal: Stand to Sit - Progress: Not met Pt will Ambulate: >150 feet;with modified independence;with least restrictive assistive device PT Goal: Ambulate - Progress: Not met Pt will Go Up / Down Stairs: 6-9 stairs;with supervision;with rail(s) PT Goal: Up/Down Stairs - Progress: Not met  PT Evaluation Precautions/Restrictions  Precautions Precautions: Back Precaution Comments: educated verbally in precautions with reminders during mobility.  OT to issue handout Prior Functioning  Home Living Lives With: Spouse Type of Home: House Home Layout: Able to live on main level with bedroom/bathroom;Two level Home Access: Stairs to enter Entrance Stairs-Rails: Right Entrance Stairs-Number of Steps: 5 in front, 18 in back Bathroom Shower/Tub: Walk-in shower;Tub/shower unit Home Adaptive Equipment: Walker - rolling Additional Comments: walker was father in laws Prior Function Level  of Independence: Independent with basic ADLs;Independent with transfers;Independent with gait;Independent with homemaking with ambulation Driving: Yes Cognition Cognition Arousal/Alertness:  Awake/alert Overall Cognitive Status: Appears within functional limits for tasks assessed Sensation/Coordination   Extremity Assessment RLE Assessment RLE Assessment: Within Functional Limits (for ambulation, not formally tested) LLE Assessment LLE Assessment: Within Functional Limits (for ambulation, not formally tested) Mobility (including Balance) Bed Mobility Bed Mobility: Yes Rolling Left: 5: Supervision Rolling Left Details (indicate cue type and reason): cues for precautions Left Sidelying to Sit: 5: Supervision;With rails Left Sidelying to Sit Details (indicate cue type and reason): cues for technique Sit to Sidelying Right: 5: Supervision;With rail Sit to Sidelying Right Details (indicate cue type and reason): cues for technique Transfers Transfers: Yes Sit to Stand: From bed;4: Min assist;From elevated surface Sit to Stand Details (indicate cue type and reason): cues for safety Stand to Sit: 5: Supervision;With upper extremity assist;To bed Stand to Sit Details: cues for hand placement Ambulation/Gait Ambulation/Gait: Yes Ambulation/Gait Assistance: 5: Supervision Ambulation/Gait Assistance Details (indicate cue type and reason): supervision to minguard assist for safety, cues for safety with turns and limited lifting Ambulation Distance (Feet): 200 Feet Assistive device: Rolling walker Gait Pattern: Decreased stride length    Exercise    End of Session PT - End of Session Equipment Utilized During Treatment: Gait belt Activity Tolerance: Patient tolerated treatment well Patient left: in bed;with call bell in reach General Behavior During Session: [facility] for tasks performed Cognition: [facility] for tasks performed  [facility] 02/20/2011, 12:03 PM

## 2011-02-20 NOTE — Op Note (Signed)
NAME:  Victor [name], Victor [name] NO.:  [id number]  MEDICAL RECORD NO.:  [id number]  LOCATION:  1616                         FACILITY:  [facility]  PHYSICIAN:  Jene Every, M.D.    DATE OF BIRTH:  [birth year]  DATE OF PROCEDURE:  02/19/2011 DATE OF DISCHARGE:                              OPERATIVE REPORT   PREOPERATIVE DIAGNOSIS:  Spinal stenosis, herniated nucleus pulposus, L4- 5 and L5-S1.  POSTOPERATIVE DIAGNOSIS:  Spinal stenosis, herniated nucleus pulposus, L4-5 and L5-S1.  PROCEDURES PERFORMED: 1. Lumbar decompression L4-5 and L5-S1, left. 2. Foraminotomies of L5 and S1. 3. Microdiskectomy at L5-S1, hemilaminectomy of L5.  ANESTHESIA:  General.  ASSISTANT:  Roma Schanz, PA.  HISTORY:  A 51 year old with severe left lower extremity radicular pain, L5 nerve root distribution secondary to severe neural foraminal stenosis on the left, positive neural tension signs, EHL weakness, is indicated for decompression at L5-S1, diskectomy, foraminotomy, partial facetectomy, and probable hemilaminectomy, fully decompressed L5 root. Risks and benefits were discussed including bleeding, infection, damage to neurovascular structures, no change in symptoms, worsening symptoms, need for repeat debridement, DVT, PE, anesthetic complications etc. Preoperatively, the patient looked like he had thrombocytopenia.  Full workup, had an enlarged spleen.  Gave him platelets 1 unit preoperatively, no bruising or bleeding was noted.  TECHNIQUE:  With the patient in supine position, after induction of adequate general anesthesia and 2 g Kefzol, he was placed prone on the Cornelius frame.  All bony prominences were well padded.  Lumbar region was prepped and draped in the usual sterile fashion.  Two 18-gauge spinal needles were utilized to localize L4-5 and L5-S1 interspace, confirmed with x-ray.  Incision was made from spinous process at L5 and below S1, subcutaneous tissue was dissected,  electrocautery utilized to achieve hemostasis.  Dorsolumbar fascia was identified by the skin incision.  Paraspinous muscle was elevated from the lamina of L4-5 and L5-S1.  Operating microscope was draped and brought on the surgical field.  Confirmatory radiograph was obtained.  We used a Leksell rongeur to remove a portion of the lamina at L5.  Straight curette was utilized to detach the ligamentum flavum from the cephalad edge of S1.  Neuro patty was placed beneath the ligamentum flavum.  We performed foraminotomy of S1.  Ligamentum flavum was removed from the interspace. Hypertrophic facet was noted and severe lateral recess stenosis compressing the L5 root into the foramen.  We used an osteotome to remove the inferior process of L5 and the superior articulating process of S1 was identified, enlarged, and decompressing the 5 root.  We used a straight curette to detach the ligamentum flavum, perform foraminotomy, removing superior articulating process of S1.  We performed a full foraminotomy decompressing the L5 root.  Ligamentum flavum hypertrophy was noted, small disk herniation was noted, protecting the neural elements.  We performed a small annulotomy and repaired the small disk herniation with a micropituitary.  No further herniation was noted in the foramen of S1 or in the foramen of L5 beneath the thecal sac.  There was some compression of the L5 root more cephalad.  After probing we performed a hemilaminectomy utilizing a 2-3 mm Kerrison, protecting the  thecal sac.  After this was performed, we removed the ligamentum flavum from L4-5.  There was no disk herniation noted at L4-5 or the cephalad acromion with a hockey stick probe.  Full inspection of the L5 root revealed complete decompression from its origin out through the foramen of L5.  No evidence of CSF leakage or active bleeding.  Bipolar cautery was utilized for strict hemostasis.  I had at least a centimeter of excursion  of the S1 nerve root medial pedicle without difficulty.  We checked beneath the thecal sac  under the axilla of the L5 root and shoulder, no neurocompressive lesion.  L5 was actually fairly flattened, significantly attenuated by the neural compression.  We fully decompressed it,  following that.  A hockey-stick probe passed freely up the foramen of L5 following that.  The disk space was copiously irrigated with antibiotic irrigation utilizing an antibacterial for irrigation.  Thrombin-soaked Gelfoam was placed in the laminotomy defect.  We achieved strict hemostasis.  Minimal blood loss, less than 25.  Removed the McCullough retractor, there was no evidence of paraspinous bleeding.  Dorsolumbar fascia was reapproximated with 1 Vicryl interrupted figure-of-eight suture, subcutaneous with 2-0 Vicryl simple sutures.  Skin was re-approximated with staples.  Wound was dressed sterilely.  He was placed supine on the hospital bed, extubated without difficulty, and transported to Recovery in satisfactory condition.  The patient tolerated the procedure well.  No complications.  Utilized Administrator.  No specimen.  Blood loss, 25 cc.     Jene Every, M.D.     Cordelia Pen  D:  02/19/2011  T:  02/20/2011  Job:  [id number]

## 2011-02-20 NOTE — Progress Notes (Signed)
OT Cancellation Note  ___Treatment cancelled today due to medical issues with patient which prohibited therapy  ___ Treatment cancelled today due to patient receiving procedure or test   ___ Treatment cancelled today due to patient's refusal to participate   _x__ Treatment cancelled today due to pt fatiqued.  Did PT earlier:  Will check back later or tomorrow for eval    Marica Otter, OTR/L [phone] 02/20/2011

## 2011-02-20 NOTE — Progress Notes (Signed)
Subjective: 1 Day Post-Op Procedure(s) (LRB): LUMBAR LAMINECTOMY/DECOMPRESSION MICRODISCECTOMY (Left) Patient reports pain as 8 on 0-10 scale.  Patient has only been out of bed to go to the bathroom.  He notes mainly low back pain.  He does note improvement in LE pain.  He is requesting a sleep aide. Denies CP or SOB.  Voiding without difficulty. Positive flatus. Objective: Vital signs in last 24 hours: Temp:  [97 F (36.1 C)-98.9 F (37.2 C)] 97.8 F (36.6 C) (01/08 0453) Pulse Rate:  [65-89] 73  (01/08 0453) Resp:  [11-18] 18  (01/08 0453) BP: (112-155)/(66-88) 128/84 mmHg (01/08 0453) SpO2:  [93 %-100 %] 97 % (01/08 0453) Weight:  [117.482 kg (259 lb)] 259 lb (117.482 kg) (01/07 2000)  Intake/Output from previous day: 01/07 0701 - 01/08 0700 In: 2945 [I.V.:2750; Blood:195] Out: 700 [Urine:650; Blood:50] Intake/Output this shift:     Basename 02/20/11 0410 02/19/11 1325  HGB 13.9 14.3    Basename 02/20/11 0410 02/19/11 1830 02/19/11 1325  WBC 8.1 -- 5.2  RBC 4.81 -- 4.99  HCT 41.1 -- 41.9  PLT 68* 63* --    Basename 02/19/11 1325  NA 135  K 3.8  CL 99  CO2 25  BUN 14  CREATININE 0.72  GLUCOSE 333*  CALCIUM 9.5    Basename 02/19/11 1325  LABPT --  INR 1.17    ABD soft Neurovascular intact Sensation intact distally Dorsiflexion/Plantar flexion intact Incision: dressing C/D/I Compartment soft  Assessment/Plan: 1 Day Post-Op Procedure(s) (LRB): LUMBAR LAMINECTOMY/DECOMPRESSION MICRODISCECTOMY (Left) Advance diet Up with therapy D/C IV fluids Plan for discharge tomorrow Will try Restoril for sleep however he has taken multiple sleep aids in the past without results.   Platelets stable. Cont sliding scale insulin   Isma Tietje R. 02/20/2011, 8:06 AM

## 2011-02-21 ENCOUNTER — Encounter (HOSPITAL_COMMUNITY): Payer: Self-pay | Admitting: Specialist

## 2011-02-21 LAB — GLUCOSE, CAPILLARY
Glucose-Capillary: 210 mg/dL — ABNORMAL HIGH (ref 70–99)
Glucose-Capillary: 232 mg/dL — ABNORMAL HIGH (ref 70–99)
Glucose-Capillary: 191 mg/dL — ABNORMAL HIGH (ref 70–99)
Glucose-Capillary: 250 mg/dL — ABNORMAL HIGH (ref 70–99)

## 2011-02-21 MED ORDER — INSULIN ASPART 100 UNIT/ML ~~LOC~~ SOLN
0.0000 [IU] | Freq: Three times a day (TID) | SUBCUTANEOUS | Status: DC
  Administered 2011-02-21: 4 [IU] via SUBCUTANEOUS
  Administered 2011-02-21: 5 [IU] via SUBCUTANEOUS
  Administered 2011-02-22: 4 [IU] via SUBCUTANEOUS
  Administered 2011-02-22: 7 [IU] via SUBCUTANEOUS
  Administered 2011-02-22: 4 [IU] via SUBCUTANEOUS
  Administered 2011-02-23: 3 [IU] via SUBCUTANEOUS
  Administered 2011-02-23 (×2): 4 [IU] via SUBCUTANEOUS
  Administered 2011-02-24: 3 [IU] via SUBCUTANEOUS

## 2011-02-21 MED ORDER — INSULIN ASPART 100 UNIT/ML ~~LOC~~ SOLN
0.0000 [IU] | Freq: Every day | SUBCUTANEOUS | Status: DC
  Administered 2011-02-21: 2 [IU] via SUBCUTANEOUS

## 2011-02-21 MED ORDER — TAMSULOSIN HCL 0.4 MG PO CAPS
0.4000 mg | ORAL_CAPSULE | Freq: Every day | ORAL | Status: DC
  Administered 2011-02-21 – 2011-02-24 (×4): 0.4 mg via ORAL
  Filled 2011-02-21 (×5): qty 1

## 2011-02-21 NOTE — Progress Notes (Signed)
Physical Therapy Treatment Patient Details Name: Victor [name] MRN: [id number] DOB: [birth year] Today's Date: 02/21/2011 2440-1027 2GT  PT Assessment/Plan  PT - Assessment/Plan Comments on Treatment Session: Patient not discharged due to unable to void.  Able to ambulate last evening with wife per his report.  Will benefit from wider RW (likely Invacare brand) due to patient not at weight limit for bariatric. PT Plan: Discharge plan remains appropriate PT Frequency: Min 6X/week Follow Up Recommendations: No PT follow up Equipment Recommended: Rolling walker with 5" wheels (Invacare if it is wider) PT Goals  Acute Rehab PT Goals Pt will Roll Supine to Right Side: with modified independence PT Goal: Rolling Supine to Right Side - Progress: Met Pt will go Supine/Side to Sit: with modified independence PT Goal: Supine/Side to Sit - Progress: Progressing toward goal Pt will go Sit to Stand: with modified independence PT Goal: Sit to Stand - Progress: Progressing toward goal Pt will go Stand to Sit: with modified independence PT Goal: Stand to Sit - Progress: Progressing toward goal Pt will Ambulate: >150 feet;with modified independence;with least restrictive assistive device PT Goal: Ambulate - Progress: Progressing toward goal  PT Treatment Precautions/Restrictions  Precautions Precautions: Back Precaution Comments: educated verbally in precautions with reminders during mobility.  OT to issue handout Mobility (including Balance) Bed Mobility Rolling Right: 6: Modified independent (Device/Increase time) Right Sidelying to Sit: 5: Supervision Right Sidelying to Sit Details (indicate cue type and reason): cues for technique Transfers Sit to Stand: 5: Supervision;From elevated surface;From bed;With upper extremity assist Stand to Sit: 5: Supervision;To chair/3-in-1;With upper extremity assist Stand to Sit Details: cues for positioning due to pain in  catheter. Ambulation/Gait Ambulation/Gait: Yes Ambulation/Gait Assistance: 4: Min assist Ambulation/Gait Assistance Details (indicate cue type and reason): minguard Ambulation Distance (Feet): 300 Feet Assistive device: Rolling walker Gait Pattern: Within Functional Limits    Exercise    End of Session PT - End of Session Equipment Utilized During Treatment: Gait belt Activity Tolerance: Patient tolerated treatment well Patient left: in chair;with call bell in reach General Behavior During Session: [facility] for tasks performed Cognition: [facility] for tasks performed  [facility] 02/21/2011, 3:38 PM

## 2011-02-21 NOTE — Progress Notes (Signed)
Occupational Therapy Evaluation Patient Details Name: Victor [name] MRN: [id number] DOB: Jan 13, 1961 Today's Date: 02/21/2011 15:24-16:01 evII Problem List:  Patient Active Problem List  Diagnoses  . Hepatitis B virus infection  . Thrombocytopenia  . Sciatica  . Diabetes mellitus  . Hyperlipidemia  . Splenomegaly    Past Medical History:  Past Medical History  Diagnosis Date  . Hepatitis B virus infection   . Thrombocytopenia   . Sciatica   . Diabetes mellitus   . Hyperlipidemia   . Hypertension   . GERD (gastroesophageal reflux disease)   . Osteoarthritis   . Anxiety   . Depression   . Recurrent upper respiratory infection (URI)     head cold last week- no fever- states resolved  . Blood transfusion     platelets  . Chronic kidney disease     kidney stones 2012  . Hepatitis     hepatitis b/ newly diagnosed with portal hypertension  . Splenomegaly     LOV Dr Gaylyn Rong  12/12 on chart   Past Surgical History:  Past Surgical History  Procedure Date  . Hand surgery     right hand  . Shoulder surgery     left shoulder  . Knee surgery     5 surgeries to right knee, 2 surgeries to left knee  . Lumbar laminectomy/decompression microdiscectomy 02/19/2011    Procedure: LUMBAR LAMINECTOMY/DECOMPRESSION MICRODISCECTOMY;  Surgeon: Javier Docker;  Location: [facility];  Service: Orthopedics;  Laterality: Left;  decompression l5-s1 l4-5 on left    OT Assessment/Plan/Recommendation OT Assessment Clinical Impression Statement: 51 year old admitted for elective lumbar leminectomy and decompression.  Presents with slight increased pain in his back and slight impairments in strength and balance.  Currently however he is at a supervision level for simulated selfcare using appropriate DME and AE.  Feel he will need a 3:1 for home secondary to having a standard height toilet.  This can also help increase his independence with bathing as well, since it can also be used for a shower seat. Feel pt  will have adequate supervision from wife at home and will likely reach modified independent level in a couple of days.  No further acute or post acute OT needs at this time.   OT Recommendation/Assessment: Patient does not need any further OT services OT Recommendation Follow Up Recommendations: No OT follow up Equipment Recommended: 3 in 1 bedside comode Individuals Consulted Consulted and Agree with Results and Recommendations: Patient OT Goals    OT Evaluation Precautions/Restrictions  Precautions Precautions: Back Precaution Comments: educated verbally in precautions with reminders during mobility.  OT to issue handout Required Braces or Orthoses: No Restrictions Weight Bearing Restrictions: No Prior Functioning Home Living Lives With: Spouse Type of Home: House Home Layout: Able to live on main level with bedroom/bathroom;Two level Home Access: Stairs to enter Entrance Stairs-Rails: Right Entrance Stairs-Number of Steps: 5 in front, 18 in back Bathroom Shower/Tub: Walk-in shower;Tub/shower unit (Pt reports that he will likely use a walk-in shower.) Bathroom Toilet: Standard Bathroom Accessibility: Yes Home Adaptive Equipment: Walker - rolling Additional Comments: walker was father in laws Prior Function Level of Independence: Independent with basic ADLs;Independent with transfers;Independent with gait;Independent with homemaking with ambulation ADL ADL Eating/Feeding: Simulated;Independent Where Assessed - Eating/Feeding: Chair Grooming: Simulated;Performed;Supervision/safety Where Assessed - Grooming: Sitting, chair Upper Body Bathing: Simulated;Set up Where Assessed - Upper Body Bathing: Sitting, chair Lower Body Bathing: Minimal assistance Where Assessed - Lower Body Bathing: Sit to stand from chair Upper  Body Dressing: Simulated;Set up Where Assessed - Upper Body Dressing: Sitting, chair Lower Body Dressing: Performed;Set up Lower Body Dressing Details (indicate  cue type and reason): Pt able to utilize AE for LB selfcare (reacher and sockaide) Where Assessed - Lower Body Dressing: Sit to stand from chair Toilet Transfer: Performed;Supervision/safety Toilet Transfer Details (indicate cue type and reason): Pt required use of grab bar to stand from comfort height toilet. Toilet Transfer Method: Proofreader: Comfort height toilet;Grab bars Toileting - Clothing Manipulation: Simulated Where Assessed - Glass blower/designer Manipulation: Sit to stand from 3-in-1 or toilet Toileting - Hygiene: Simulated;Supervision/safety Where Assessed - Toileting Hygiene: Sit to stand from 3-in-1 or toilet Tub/Shower Transfer: Performed;Supervision/safety Tub/Shower Transfer Details (indicate cue type and reason): Step over posteriorly and out anteriorly with RW in front for support Tub/Shower Transfer Method: Ambulating;Anterior-posterior Ambulation Related to ADLs: Pt supervision for ambulation within room, to and from the bathroom to practice transfers. ADL Comments: Pt is overall supervision for functional transfers and min to mod assist for LB selfcare without use of AE.  Educated pt and wife on AE needed for LB selfcare for pt to be independent.  She will look at purchasing outside of the hospital.  Feel pt will need a 3:1 at home to help increase independence for toileting secondary to having a low standard toilet at home. Vision/Perception  Vision - History Baseline Vision: Wears glasses all the time Patient Visual Report: No change from baseline Vision - Assessment Eye Alignment: Within Functional Limits Perception Perception: Within Functional Limits Praxis Praxis: Intact Cognition Cognition Arousal/Alertness: Awake/alert Overall Cognitive Status: Appears within functional limits for tasks assessed Cognition - Other Comments: Pt is unable to recall back precautions, reports that this occassional lapse in memory is  normal. Sensation/Coordination Sensation Light Touch: Appears Intact Stereognosis: Appears Intact Hot/Cold: Appears Intact Proprioception: Appears Intact Coordination Gross Motor Movements are Fluid and Coordinated: Yes Fine Motor Movements are Fluid and Coordinated: Yes Extremity Assessment RUE Assessment RUE Assessment: Within Functional Limits LUE Assessment LUE Assessment: Within Functional Limits Mobility  Bed Mobility Bed Mobility: No Rolling Right: 6: Modified independent (Device/Increase time) Right Sidelying to Sit: 5: Supervision Right Sidelying to Sit Details (indicate cue type and reason): cues for technique Transfers Transfers: Yes Sit to Stand: 5: Supervision;From chair/3-in-1;With upper extremity assist Stand to Sit: 5: Supervision;With upper extremity assist;To chair/3-in-1;To toilet Stand to Sit Details: cues for positioning due to pain in catheter. Exercises   End of Session OT - End of Session Equipment Utilized During Treatment: Other (comment) (Rolling Walker) Activity Tolerance: Patient tolerated treatment well Patient left: in chair;with call bell in reach;with family/visitor present General Behavior During Session: [facility] for tasks performed Cognition: [facility] for tasks performed   Ramya Vanbergen OTR/L 02/21/2011, 4:30 PM  Pager number [phone]

## 2011-02-21 NOTE — Progress Notes (Signed)
Subjective: 2 Days Post-Op Procedure(s) (LRB): LUMBAR LAMINECTOMY/DECOMPRESSION MICRODISCECTOMY (Left) Patient reports pain as 5 on 0-10 scale.   Denies CP or SOB.  He is having difficulty voiding since last night.  RN was unable to place cath this am, pt was started on Flomax by [employer]. Positive flatus.  Recent bladder scan was around 500. Objective: Vital signs in last 24 hours: Temp:  [98 F (36.7 C)-98.6 F (37 C)] 98 F (36.7 C) (01/09 0700) Pulse Rate:  [71-83] 83  (01/09 0700) Resp:  [16-18] 16  (01/09 0700) BP: (130-150)/(82-85) 130/82 mmHg (01/09 0700) SpO2:  [92 %-97 %] 92 % (01/09 0700)  Intake/Output from previous day: 01/08 0701 - 01/09 0700 In: 440 [P.O.:440] Out: -  Intake/Output this shift: Total I/O In: 200 [P.O.:200] Out: -    Basename 02/20/11 0410 02/19/11 1325  HGB 13.9 14.3    Basename 02/20/11 0410 02/19/11 1830 02/19/11 1325  WBC 8.1 -- 5.2  RBC 4.81 -- 4.99  HCT 41.1 -- 41.9  PLT 68* 63* --    Basename 02/19/11 1325  NA 135  K 3.8  CL 99  CO2 25  BUN 14  CREATININE 0.72  GLUCOSE 333*  CALCIUM 9.5    Basename 02/19/11 1325  LABPT --  INR 1.17    Neurologically intact Sensation intact distally Dorsiflexion/Plantar flexion intact Incision: dressing C/D/I Compartment soft Normal perianal  Sensation good rectal tone  Assessment/Plan: 2 Days Post-Op Procedure(s) (LRB): LUMBAR LAMINECTOMY/DECOMPRESSION MICRODISCECTOMY (Left) Will place foley, cont Flomax. Will hold d/c today secondary to difficulty voiding Dressing change today Will adjust sliding scale per diabetic coordinator recommendations    Kenzi Bardwell R. 02/21/2011, 10:43 AM

## 2011-02-21 NOTE — Progress Notes (Signed)
Patient reported bladder distention and mild discomfort.  Stated that voiding has been difficult since surgery, taking up to "5 minutes" at times. Bladder scan performed, 373 ml. Per order an I&O cath was attempted, resistance was met, catheter removed without results.  PA on call, Alphonsa Overall, notified. Obtained order for flomax as well as an order to use a coude catheter if needed. Will continue to monitor.

## 2011-02-21 NOTE — Progress Notes (Signed)
OT attempted to eval pt, however pt still eating his lunch and requested OT return later. OT will check on later as time allows.  Lafe Garin MS, OTR/L Acute Rehab Dept

## 2011-02-22 LAB — HEMOGLOBIN A1C
Hgb A1c MFr Bld: 11.3 % — ABNORMAL HIGH (ref <5.7)
Mean Plasma Glucose: 278 mg/dL — ABNORMAL HIGH (ref <117)

## 2011-02-22 LAB — BASIC METABOLIC PANEL
BUN: 12 mg/dL (ref 6–23)
CO2: 31 mEq/L (ref 19–32)
Calcium: 9.4 mg/dL (ref 8.4–10.5)
Chloride: 97 mEq/L (ref 96–112)
Creatinine, Ser: 0.84 mg/dL (ref 0.50–1.35)
GFR calc Af Amer: >90 mL/min (ref >90)
GFR calc non Af Amer: >90 mL/min (ref >90)
Glucose, Bld: 151 mg/dL — ABNORMAL HIGH (ref 70–99)
Potassium: 3.7 mEq/L (ref 3.5–5.1)
Sodium: 136 mEq/L (ref 135–145)

## 2011-02-22 LAB — GLUCOSE, CAPILLARY
Glucose-Capillary: 212 mg/dL — ABNORMAL HIGH (ref 70–99)
Glucose-Capillary: 171 mg/dL — ABNORMAL HIGH (ref 70–99)
Glucose-Capillary: 203 mg/dL — ABNORMAL HIGH (ref 70–99)
Glucose-Capillary: 168 mg/dL — ABNORMAL HIGH (ref 70–99)

## 2011-02-22 MED ORDER — SODIUM CHLORIDE 0.9 % IV BOLUS (SEPSIS)
500.0000 mL | Freq: Once | INTRAVENOUS | Status: AC
  Administered 2011-02-22: 18:00:00 via INTRAVENOUS

## 2011-02-22 MED ORDER — SODIUM CHLORIDE 0.9 % IV SOLN
INTRAVENOUS | Status: DC
  Administered 2011-02-23 – 2011-02-24 (×3): via INTRAVENOUS

## 2011-02-22 MED ORDER — ASPIRIN 325 MG PO TABS: 325.0000 mg | ORAL_TABLET | Freq: Every day | ORAL | Status: AC

## 2011-02-22 MED ORDER — ASPIRIN 325 MG PO TABS
325.0000 mg | ORAL_TABLET | Freq: Every day | ORAL | Status: DC
  Administered 2011-02-22 – 2011-02-24 (×3): 325 mg via ORAL
  Filled 2011-02-22 (×4): qty 1

## 2011-02-22 MED ORDER — TAMSULOSIN HCL 0.4 MG PO CAPS: 0.4000 mg | ORAL_CAPSULE | Freq: Every day | ORAL | Status: DC

## 2011-02-22 NOTE — Progress Notes (Signed)
Physical Therapy Treatment Patient Details Name: Victor [name] MRN: [id number] DOB: 12-05-1960 Today's Date: 02/22/2011 [id number] 2gt PT Assessment/Plan  PT - Assessment/Plan Comments on Treatment Session: pt doing well; ready for d/c except voiding issues; ready for d/c from PT PT Plan: Discharge plan remains appropriate Follow Up Recommendations: No PT follow up Equipment Recommended: Rolling walker with 5" wheels;3 in 1 bedside comode (invacare RW) PT Goals  Acute Rehab PT Goals Pt will Roll Supine to Right Side: with modified independence PT Goal: Rolling Supine to Right Side - Progress: Met Pt will go Supine/Side to Sit: with modified independence PT Goal: Supine/Side to Sit - Progress: Met Pt will go Sit to Stand: with modified independence PT Goal: Sit to Stand - Progress: Met Pt will go Stand to Sit: with modified independence PT Goal: Stand to Sit - Progress: Met Pt will Ambulate: >150 feet;with modified independence;with least restrictive assistive device PT Goal: Ambulate - Progress: Met Pt will Go Up / Down Stairs: 6-9 stairs;with supervision;with rail(s) PT Goal: Up/Down Stairs - Progress: Met  PT Treatment Precautions/Restrictions  Precautions Precautions: Back Precaution Comments: educated verbally in precautions with reminders during mobility.  OT to issue handout Required Braces or Orthoses: No Restrictions Weight Bearing Restrictions: No Mobility (including Balance) Bed Mobility Bed Mobility: Yes Rolling Left: 6: Modified independent (Device/Increase time) Left Sidelying to Sit: 6: Modified independent (Device/Increase time);With rails Transfers Sit to Stand: 6: Modified independent (Device/Increase time) Stand to Sit: 6: Modified independent (Device/Increase time) Ambulation/Gait Ambulation/Gait Assistance: 5: Supervision;6: Modified independent (Device/Increase time) Ambulation Distance (Feet): 300 Feet Assistive device: Rolling walker Gait Pattern:  Within Functional Limits    Exercise    End of Session PT - End of Session Activity Tolerance: Patient tolerated treatment well Patient left: in chair;with call bell in reach General Behavior During Session: [facility] for tasks performed Cognition: [facility] for tasks performed  [facility] 02/22/2011, 9:31 AM

## 2011-02-22 NOTE — Progress Notes (Signed)
Inpatient Diabetes Program Recommendations  AACE/ADA: New Consensus Statement on Inpatient Glycemic Control (2009)  Target Ranges:  Prepandial:   less than 140 mg/dL      Peak postprandial:   less than 180 mg/dL (1-2 hours)      Critically ill patients:  140 - 180 mg/dL   Reason for Visit: Persistent high fasting glucose  Inpatient Diabetes Program Recommendations Insulin - Basal: Please add Lantus while here of 10 - 15 units at HS (or daily) . Fsting glucose levels are high. Correction (SSI): xxx HgbA1C: High at 11.3 %   May need Lantus at home or PCP might want to try Byetta/Victoza at home.  Note: Thank you, Lenor Coffin, RN, CNS, Diabetes Coordinator [phone])

## 2011-02-22 NOTE — Progress Notes (Signed)
Subjective: 3 Days Post-Op Procedure(s) (LRB): LUMBAR LAMINECTOMY/DECOMPRESSION MICRODISCECTOMY (Left) Patient reports pain as 6 on 0-10 scale.  He has noted some continued low back pain and now some left buttock pain but this could be related to being in the bed too much. Denies CP or SOB.  Positive flatus. Foley remains in place. Objective: Vital signs in last 24 hours: Temp:  [98 F (36.7 C)-98.3 F (36.8 C)] 98.1 F (36.7 C) (01/10 0415) Pulse Rate:  [75-85] 75  (01/10 0415) Resp:  [15-18] 15  (01/10 0415) BP: (113-155)/(71-89) 113/71 mmHg (01/10 0415) SpO2:  [92 %-99 %] 92 % (01/10 0415)  Intake/Output from previous day: 01/09 0701 - 01/10 0700 In: 1540 [P.O.:1540] Out: 950 [Urine:950] Intake/Output this shift: Total I/O In: -  Out: 250 [Urine:250]   Basename 02/20/11 0410 02/19/11 1325  HGB 13.9 14.3    Basename 02/20/11 0410 02/19/11 1830 02/19/11 1325  WBC 8.1 -- 5.2  RBC 4.81 -- 4.99  HCT 41.1 -- 41.9  PLT 68* 63* --    Basename 02/19/11 1325  NA 135  K 3.8  CL 99  CO2 25  BUN 14  CREATININE 0.72  GLUCOSE 333*  CALCIUM 9.5    Basename 02/19/11 1325  LABPT --  INR 1.17    Neurologically intact Neurovascular intact Sensation intact distally Dorsiflexion/Plantar flexion intact Compartment soft  Assessment/Plan: 3 Days Post-Op Procedure(s) (LRB): LUMBAR LAMINECTOMY/DECOMPRESSION MICRODISCECTOMY (Left) Will d/c foley if no void then will reinsert foley and d/c home with urology f/u in 1-2 weeks  D/c IV ASA Needs better glucose control- f/u with PCP  Khushi Zupko R. 02/22/2011, 9:11 AM

## 2011-02-23 LAB — BASIC METABOLIC PANEL
BUN: 14 mg/dL (ref 6–23)
CO2: 30 mEq/L (ref 19–32)
Calcium: 9 mg/dL (ref 8.4–10.5)
Chloride: 98 mEq/L (ref 96–112)
Creatinine, Ser: 0.77 mg/dL (ref 0.50–1.35)
GFR calc Af Amer: >90 mL/min (ref >90)
GFR calc non Af Amer: >90 mL/min (ref >90)
Glucose, Bld: 178 mg/dL — ABNORMAL HIGH (ref 70–99)
Potassium: 3.8 mEq/L (ref 3.5–5.1)
Sodium: 135 mEq/L (ref 135–145)

## 2011-02-23 LAB — URINALYSIS, ROUTINE W REFLEX MICROSCOPIC
Glucose, UA: NEGATIVE mg/dL
Hgb urine dipstick: NEGATIVE
Nitrite: NEGATIVE
Protein, ur: NEGATIVE mg/dL
Specific Gravity, Urine: 1.036 — ABNORMAL HIGH (ref 1.005–1.030)
Urobilinogen, UA: 1 mg/dL (ref 0.0–1.0)
pH: 6 (ref 5.0–8.0)

## 2011-02-23 LAB — URINE MICROSCOPIC-ADD ON

## 2011-02-23 LAB — GLUCOSE, CAPILLARY
Glucose-Capillary: 171 mg/dL — ABNORMAL HIGH (ref 70–99)
Glucose-Capillary: 175 mg/dL — ABNORMAL HIGH (ref 70–99)
Glucose-Capillary: 137 mg/dL — ABNORMAL HIGH (ref 70–99)
Glucose-Capillary: 142 mg/dL — ABNORMAL HIGH (ref 70–99)

## 2011-02-23 MED ORDER — INSULIN PEN STARTER KIT
1.0000 | Freq: Once | Status: DC
  Filled 2011-02-23: qty 1

## 2011-02-23 MED ORDER — INSULIN GLARGINE 100 UNIT/ML ~~LOC~~ SOLN
10.0000 [IU] | Freq: Every day | SUBCUTANEOUS | Status: DC
  Administered 2011-02-23 – 2011-02-24 (×2): 10 [IU] via SUBCUTANEOUS
  Filled 2011-02-23: qty 3

## 2011-02-23 MED ORDER — CIPROFLOXACIN IN D5W 400 MG/200ML IV SOLN
400.0000 mg | Freq: Two times a day (BID) | INTRAVENOUS | Status: DC
  Administered 2011-02-23 – 2011-02-24 (×2): 400 mg via INTRAVENOUS
  Filled 2011-02-23 (×4): qty 200

## 2011-02-23 MED ORDER — ALUM & MAG HYDROXIDE-SIMETH 200-200-20 MG/5ML PO SUSP: 30.0000 mL | ORAL | Status: DC | PRN

## 2011-02-23 NOTE — Progress Notes (Signed)
Subjective: 4 Days Post-Op Procedure(s) (LRB): LUMBAR LAMINECTOMY/DECOMPRESSION MICRODISCECTOMY (Left) Patient reports pain as 5 on 0-10 scale.   Denies CP or SOB.  Positive flatus.  He continues to have difficulty voiding at this time however this am did void 125 cc.  He denies any saddle anesthesia.  Patient denies any previous problems Objective: Vital signs in last 24 hours: Temp:  [97.7 F (36.5 C)-98.1 F (36.7 C)] 97.9 F (36.6 C) (01/11 0537) Pulse Rate:  [78-90] 90  (01/11 0537) Resp:  [14-18] 16  (01/11 0537) BP: (116-141)/(74-80) 128/77 mmHg (01/11 0537) SpO2:  [92 %-98 %] 98 % (01/11 0537)  Intake/Output from previous day: 01/10 0701 - 01/11 0700 In: 3152.5 [P.O.:1680; I.V.:972.5; IV Piggyback:500] Out: 900 [Urine:900] Intake/Output this shift:    No results found for this basename: HGB:5 in the last 72 hours No results found for this basename: WBC:2,RBC:2,HCT:2,PLT:2 in the last 72 hours  Basename 02/23/11 0343 02/22/11 1529  NA 135 136  K 3.8 3.7  CL 98 97  CO2 30 31  BUN 14 12  CREATININE 0.77 0.84  GLUCOSE 178* 151*  CALCIUM 9.0 9.4   No results found for this basename: LABPT:2,INR:2 in the last 72 hours  Neurologically intact ABD soft Sensation intact distally Dorsiflexion/Plantar flexion intact Incision: dressing C/D/I and no drainage Compartment soft  Assessment/Plan: 4 Days Post-Op Procedure(s) (LRB): LUMBAR LAMINECTOMY/DECOMPRESSION MICRODISCECTOMY (Left) Will call for medical consult, unclear at this time why patient urine output is so poor Question if related to dehydration with the concentrated urine and poor po intake I do believe he does have BPH which has improved with Flomax If pt begins to void on his own and medicine clears him he will be ready for d/c Daily dressing change  Tosha Belgarde R. 02/23/2011, 8:12 AM

## 2011-02-23 NOTE — Progress Notes (Signed)
Subjective: Decreased void   Objective: Vital signs in last 24 hours: Temp:  [97.9 F (36.6 C)-98.1 F (36.7 C)] 97.9 F (36.6 C) (01/11 0537) Pulse Rate:  [80-90] 90  (01/11 0537) Resp:  [16-18] 16  (01/11 0537) BP: (116-141)/(74-77) 128/77 mmHg (01/11 0537) SpO2:  [93 %-98 %] 98 % (01/11 0537)  Intake/Output from previous day: 01/10 0701 - 01/11 0700 In: 3152.5 [P.O.:1680; I.V.:972.5; IV Piggyback:500] Out: 900 [Urine:900] Intake/Output this shift: Total I/O In: 240 [P.O.:240] Out: 175 [Urine:175]  No results found for this basename: HGB:5 in the last 72 hours No results found for this basename: WBC:2,RBC:2,HCT:2,PLT:2 in the last 72 hours  Basename 02/23/11 0343 02/22/11 1529  NA 135 136  K 3.8 3.7  CL 98 97  CO2 30 31  BUN 14 12  CREATININE 0.77 0.84  GLUCOSE 178* 151*  CALCIUM 9.0 9.4   No results found for this basename: LABPT:2,INR:2 in the last 72 hours  Neurologically intact. Perianal sensation intact. Rectal good tone sensation intact. Motor 5/5. I/o increased 5liters since admit. Discussed with med consult. Check UA though dysuria could be due to urethra irritation from catheter. Will use diuretic if fluid challenge does not produce. No leg pain.   Assessment/Plan: See above discussion. Discussed with patient. Also need OPT better glucose control. Diet discussed. Family bringing nondiabetic diet from outside.   Victor [name] C 02/23/2011, 2:12 PM

## 2011-02-23 NOTE — Significant Event (Signed)
D: 50yo WM pt Dr. Shelle Iron under Orthopaedic Services POD#3 s/p Lumbar Laminectomy and Decompression.  Pt with h/o Hep B, Splenomegaly, portal hypertension, chronic kidney disease, kidney stones 2012, recurrent URI, anxiety, depression, OA, GERD, HTN, hyperlipidemia, thrombocytopenia, DM, sciatica, and obesity.  Pt with small gauze and tape dressing to lumbar spine c/d/i.  Pt has been having difficulty with urination postoperatively.  Pt had coude catheter placed on POD#2 and D/C'd on POD#3 with continued difficulty voiding.  Pt given NS bolus at 1800 on 02/22/2011 and then cont with NS at 41ml/hr through Lt hand PIV.  At 0015 pt has still not voided. A:  Assessed pt abdomen for distention and discomfort over bladder.  Bladder difficult to palpate due to abdominal adipose.  Pt bladder scanned at 0035 for a total of 297.  Per order pt can be I&O cathed for inability to void.  At 0045 pt I&O cathed for total of of dark amber/orange concentrated urine. R: Encouraged pt to drink as much fluids as possible. Cont with MIVF as ordered.  Will f/u in 6hrs to see if pt able to void on his own.  If not, plan to repeat I&O cath per order set.  Will cont to monitor pt status. ----- C.K.Brandin Dilday, RN

## 2011-02-23 NOTE — Progress Notes (Signed)
Physical Therapy Treatment Patient Details Name: KODEN HUNZEKER MRN: [id number] DOB: 08/07/1960 Today's Date: 02/23/2011  [phone] G  PT Assessment/Plan  PT - Assessment/Plan Comments on Treatment Session: Pt continues to be doing well and is ready for D/C following voiding issues.   PT Plan: Discharge plan remains appropriate PT Frequency: Min 6X/week Follow Up Recommendations: No PT follow up Equipment Recommended: Rolling walker with 5" wheels;3 in 1 bedside comode PT Goals  Acute Rehab PT Goals PT Goal Formulation: With patient Time For Goal Achievement: 5 days Pt will Roll Supine to Right Side: with modified independence PT Goal: Rolling Supine to Right Side - Progress: Met Pt will go Supine/Side to Sit: with modified independence PT Goal: Supine/Side to Sit - Progress: Met Pt will go Sit to Stand: with modified independence PT Goal: Sit to Stand - Progress: Partly met Pt will Ambulate: >150 feet;with modified independence;with least restrictive assistive device PT Goal: Ambulate - Progress: Met  PT Treatment Precautions/Restrictions  Precautions Precautions: Back Precaution Comments: educated verbally in precautions with reminders during mobility.  OT to issue handout Required Braces or Orthoses: No Restrictions Weight Bearing Restrictions: No Mobility (including Balance) Bed Mobility Bed Mobility: Yes Rolling Right: 6: Modified independent (Device/Increase time) Right Sidelying to Sit: 6: Modified independent (Device/Increase time) Right Sidelying to Sit Details (indicate cue type and reason): Requires increased time Transfers Transfers: Yes Sit to Stand: 5: Supervision Sit to Stand Details (indicate cue type and reason): Requires cues for hand placement on bed before standing.  Ambulation/Gait Ambulation/Gait: Yes Ambulation/Gait Assistance: 6: Modified independent (Device/Increase time) Ambulation/Gait Assistance Details (indicate cue type and reason): Requires  no cues, able to perform turns safely, only assist needed for IV follow Ambulation Distance (Feet): 200 Feet Assistive device: Rolling walker Gait Pattern: Within Functional Limits Gait velocity: Decreased    Exercise    End of Session PT - End of Session Activity Tolerance: Patient tolerated treatment well Patient left: with call bell in reach (Pt in bathroom, CNA notified) Nurse Communication: Other (comment) (CNA notified pt in bathroom.) General Behavior During Session: [facility] for tasks performed Cognition: [facility] for tasks performed  Page, Meribeth Mattes 02/23/2011, 10:25 AM

## 2011-02-23 NOTE — Consult Note (Signed)
Date of Admission:  02/19/2011  Date of Consult:  02/23/2011  Reason for Consult:Decrease Urinary Output Referring Physician: Dr Jene Every    HPI:51 year old Caucasian male history of hypertension, diabetes mellitus, depression, was admitted to the hospital on 08/19/2011 by Dr. 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 Postsurgery,patient was said to have done well with minimal pain in his back . However,he was observed to have decreased urinary output. He denies any abdominal pain. At time, patient was seen by me, he complained of dysuria but he denied any history of hematuria. No abdominal pain. No systemic symptoms i.e,no fever,chills or rigors .                                                          Past Medical History  Diagnosis Date  . Hepatitis B virus infection   . Thrombocytopenia   . Sciatica   . Diabetes mellitus   . Hyperlipidemia   . Hypertension   . GERD (gastroesophageal reflux disease)   . Osteoarthritis   . Anxiety   . Depression   . Recurrent upper respiratory infection (URI)     head cold last week- no fever- states resolved  . Blood transfusion     platelets  . Chronic kidney disease     kidney stones 2012  . Hepatitis     hepatitis b/ newly diagnosed with portal hypertension  . Splenomegaly     LOV Dr Gaylyn Rong  12/12 on chart    Medications:   Allergies:  Allergies  Allergen Reactions  . Demerol (Meperidine Hcl) Other (See Comments)    REACT TO ANOTHER MEDICATION PT IS TAKEN  . Flexeril (Cyclobenzaprine Hcl) Other (See Comments)    REACT TO ANOTHER MEDICATION PT IS TAKEN  . Vicodin (Hydrocodone-Acetaminophen) Other (See Comments)    PT TURN RED AND GETS HOT    Social History:  reports that he quit smoking about 25 years ago. He does not have any smokeless tobacco history on file. He reports that he does not drink alcohol or use illicit drugs.  History reviewed. No pertinent family  history.  Past Surgical History  Procedure Date  . Hand surgery     right hand  . Shoulder surgery     left shoulder  . Knee surgery     5 surgeries to right knee, 2 surgeries to left knee  . Lumbar laminectomy/decompression microdiscectomy 02/19/2011    Procedure: LUMBAR LAMINECTOMY/DECOMPRESSION MICRODISCECTOMY;  Surgeon: Javier Docker;  Location: [facility];  Service: Orthopedics;  Laterality: Left;  decompression l5-s1 l4-5 on left    Review of Systems: The patient denies anorexia, fever, weight loss,, vision loss, decreased hearing, hoarseness, chest pain, syncope, dyspnea on exertion, peripheral edema, balance deficits, hemoptysis, abdominal pain, melena, hematochezia, severe indigestion/heartburn,dysuria+++, hematuria, incontinence, genital sores, muscle weakness, suspicious skin lesions, transient blindness, difficulty walking, depression, unusual weight change, abnormal bleeding, enlarged lymph nodes, angioedema, and breast masses.  Blood pressure 128/77, pulse 90, temperature 97.9 F (36.6 C), temperature source Oral, resp. rate 16, height 6' (1.829 m), weight 117.482 kg (259 lb), SpO2 98.00%.  Physical Exam: Vitals as above HEENT-mild pallor,suboptimal hydration. Neck-no JVD. Chest-clear to auscultation CVS-S1 and S2 Abdomen-obese, nontender, no organomegaly, bowel sounds are positive. Extremity-trace edema Neuro-  no lateralizing sign Skin-decrease turgor    Results for orders placed during the hospital encounter of 02/19/11 (from the past 48 hour(s))  HEMOGLOBIN A1C     Status: Abnormal   Collection Time   02/21/11  1:47 PM      Component Value Range Comment   Hemoglobin A1C 11.3 (*) <5.7 (%)    Mean Plasma Glucose 278 (*) <117 (mg/dL)   GLUCOSE, CAPILLARY     Status: Abnormal   Collection Time   02/21/11  5:12 PM      Component Value Range Comment   Glucose-Capillary 191 (*) 70 - 99 (mg/dL)   GLUCOSE, CAPILLARY     Status: Abnormal   Collection Time   02/21/11  9:24 PM       Component Value Range Comment   Glucose-Capillary 250 (*) 70 - 99 (mg/dL)   GLUCOSE, CAPILLARY     Status: Abnormal   Collection Time   02/22/11  7:41 AM      Component Value Range Comment   Glucose-Capillary 212 (*) 70 - 99 (mg/dL)   GLUCOSE, CAPILLARY     Status: Abnormal   Collection Time   02/22/11 11:49 AM      Component Value Range Comment   Glucose-Capillary 171 (*) 70 - 99 (mg/dL)   BASIC METABOLIC PANEL     Status: Abnormal   Collection Time   02/22/11  3:29 PM      Component Value Range Comment   Sodium 136  135 - 145 (mEq/L)    Potassium 3.7  3.5 - 5.1 (mEq/L)    Chloride 97  96 - 112 (mEq/L)    CO2 31  19 - 32 (mEq/L)    Glucose, Bld 151 (*) 70 - 99 (mg/dL)    BUN 12  6 - 23 (mg/dL)    Creatinine, Ser 8.46  0.50 - 1.35 (mg/dL)    Calcium 9.4  8.4 - 10.5 (mg/dL)    GFR calc non Af Amer >90  >90 (mL/min)    GFR calc Af Amer >90  >90 (mL/min)   GLUCOSE, CAPILLARY     Status: Abnormal   Collection Time   02/22/11  4:59 PM      Component Value Range Comment   Glucose-Capillary 203 (*) 70 - 99 (mg/dL)    Comment 1 Notify RN      Comment 2 Documented in Chart     GLUCOSE, CAPILLARY     Status: Abnormal   Collection Time   02/22/11  9:23 PM      Component Value Range Comment   Glucose-Capillary 168 (*) 70 - 99 (mg/dL)   BASIC METABOLIC PANEL     Status: Abnormal   Collection Time   02/23/11  3:43 AM      Component Value Range Comment   Sodium 135  135 - 145 (mEq/L)    Potassium 3.8  3.5 - 5.1 (mEq/L)    Chloride 98  96 - 112 (mEq/L)    CO2 30  19 - 32 (mEq/L)    Glucose, Bld 178 (*) 70 - 99 (mg/dL)    BUN 14  6 - 23 (mg/dL)    Creatinine, Ser 9.62  0.50 - 1.35 (mg/dL)    Calcium 9.0  8.4 - 10.5 (mg/dL)    GFR calc non Af Amer >90  >90 (mL/min)    GFR calc Af Amer >90  >90 (mL/min)   GLUCOSE, CAPILLARY     Status: Abnormal   Collection Time  02/23/11  7:40 AM      Component Value Range Comment   Glucose-Capillary 171 (*) 70 - 99 (mg/dL)     ASSESSMENT/PLAN  Problems: #1 Dysuria #2 Decrease Urinary Output #3 Dehydration  Impression: #1 Dysuria-most likely urinary tract infection #2 Dehydration #3 S/p Lumbar Decompression surgery secondary to HNP #4 Hypertension #5 DM #6 Hyperlipidemia #7 Hx of Hep B  Plan: #1 Increase the rate of i.v normal saline to 100cc/hr #2 Urinalysis stat and subsequently stat iv cipro #3 Monitor urine output Will evaluate patient on daily basis. Thank you for allowing me participate in the management of this patient    Inetha Maret 02/23/2011, 12:43 PM

## 2011-02-24 LAB — COMPREHENSIVE METABOLIC PANEL
ALT: 21 U/L (ref 0–53)
AST: 23 U/L (ref 0–37)
Albumin: 3.1 g/dL — ABNORMAL LOW (ref 3.5–5.2)
Alkaline Phosphatase: 76 U/L (ref 39–117)
BUN: 13 mg/dL (ref 6–23)
CO2: 30 mEq/L (ref 19–32)
Calcium: 8.7 mg/dL (ref 8.4–10.5)
Chloride: 99 mEq/L (ref 96–112)
Creatinine, Ser: 0.76 mg/dL (ref 0.50–1.35)
GFR calc Af Amer: >90 mL/min (ref >90)
GFR calc non Af Amer: >90 mL/min (ref >90)
Glucose, Bld: 132 mg/dL — ABNORMAL HIGH (ref 70–99)
Potassium: 3.5 mEq/L (ref 3.5–5.1)
Sodium: 137 mEq/L (ref 135–145)
Total Bilirubin: 0.9 mg/dL (ref 0.3–1.2)
Total Protein: 6.3 g/dL (ref 6.0–8.3)

## 2011-02-24 LAB — CBC
HCT: 33.4 % — ABNORMAL LOW (ref 39.0–52.0)
Hemoglobin: 11 g/dL — ABNORMAL LOW (ref 13.0–17.0)
MCH: 28.4 pg (ref 26.0–34.0)
MCHC: 32.9 g/dL (ref 30.0–36.0)
MCV: 86.1 fL (ref 78.0–100.0)
Platelets: 66 10*3/uL — ABNORMAL LOW (ref 150–400)
RBC: 3.88 MIL/uL — ABNORMAL LOW (ref 4.22–5.81)
RDW: 13.4 % (ref 11.5–15.5)
WBC: 3.4 10*3/uL — ABNORMAL LOW (ref 4.0–10.5)

## 2011-02-24 LAB — DIFFERENTIAL
Basophils Absolute: 0 10*3/uL (ref 0.0–0.1)
Basophils Relative: 0 % (ref 0–1)
Eosinophils Absolute: 0.1 10*3/uL (ref 0.0–0.7)
Eosinophils Relative: 3 % (ref 0–5)
Lymphocytes Relative: 30 % (ref 12–46)
Lymphs Abs: 1 10*3/uL (ref 0.7–4.0)
Monocytes Absolute: 0.4 10*3/uL (ref 0.1–1.0)
Monocytes Relative: 11 % (ref 3–12)
Neutro Abs: 1.9 10*3/uL (ref 1.7–7.7)
Neutrophils Relative %: 55 % (ref 43–77)

## 2011-02-24 LAB — GLUCOSE, CAPILLARY: Glucose-Capillary: 126 mg/dL — ABNORMAL HIGH (ref 70–99)

## 2011-02-24 MED ORDER — INSULIN GLARGINE 100 UNIT/ML ~~LOC~~ SOLN: 10.0000 [IU] | Freq: Every day | SUBCUTANEOUS | Status: DC

## 2011-02-24 MED ORDER — OXYCODONE-ACETAMINOPHEN 7.5-325 MG PO TABS: 1.0000 | ORAL_TABLET | ORAL | Status: AC | PRN

## 2011-02-24 MED ORDER — CIPROFLOXACIN HCL 500 MG PO TABS: 500.0000 mg | ORAL_TABLET | Freq: Two times a day (BID) | ORAL | Status: AC

## 2011-02-24 NOTE — Progress Notes (Signed)
PT spoke with patient this am about signing off on pt due to doing well from therapy stand point.  Pt in agreement and states that he does not wish to have anymore therapy.  Thanks, Clovia Cuff, PT

## 2011-02-24 NOTE — Progress Notes (Signed)
[name] spoke with pt concerning d/c planning. Per pt request RW. Per pt [facility] to deliver DME. [facility] notified of referral. Pt states no other HH or DME needs. Spouse present at bedside.   Victor [name] [phone]

## 2011-02-24 NOTE — Progress Notes (Signed)
Subjective: 5 Days Post-Op Procedure(s) (LRB): LUMBAR LAMINECTOMY/DECOMPRESSION MICRODISCECTOMY (Left)   Patient reports pain as mild. Has started to be able to urinate without difficulty. No other events.  Objective:   VITALS:   Filed Vitals:   02/24/11 0700  BP: 128/72  Pulse: 78  Temp: 98 F (36.7 C)  Resp: 18    Neurovascular intact Dorsiflexion/Plantar flexion intact Incision: dressing C/D/I No cellulitis present Compartment soft  LABS  Basename 02/24/11 0615  HGB 11.0*  HCT 33.4*  WBC 3.4*  PLT 66*     Basename 02/24/11 0615 02/23/11 0343 02/22/11 1529  NA 137 135 136  K 3.5 3.8 3.7  BUN 13 14 12   CREATININE 0.76 0.77 0.84  GLUCOSE 132* 178* 151*     Assessment/Plan: 5 Days Post-Op Procedure(s) (LRB): LUMBAR LAMINECTOMY/DECOMPRESSION MICRODISCECTOMY (Left)   Up with therapy Treatment with Lantus and follow up with PCP in a week. Treatment with Cipro for potential UTI. D/C home today  Victor [name]. Glenn Christo   PAC  02/24/2011, 9:39 AM

## 2011-02-24 NOTE — Progress Notes (Signed)
Pt refused lantus order and he stated that would follow up with his primary doctor about this issue. Notified medical doctor, Talmage Nap, MD.  Dr. Beverly Gust spoke with patient; patient still refused lantus and restated that would follow up on the lantus order with his primary doctor.  Pt stable , four wheel walker, d/c instructions, and scripts given.  No questions/concerns voiced by patient.  Pt transported to private vehicle via wheelchair by RN and wife.

## 2011-02-24 NOTE — Progress Notes (Signed)
Patient ID: Victor [name], male   DOB: 1960/10/17, 51 y.o.   MRN: [id number] Subjective: Patient seen. Denies any specific complaints. Patient claims he is voiding urine without any difficulty. Was informed by the RN  that patient is being discharged by the primary team.  Objective: Weight change:   Intake/Output Summary (Last 24 hours) at 02/24/11 1331 Last data filed at 02/24/11 1234  Gross per 24 hour  Intake 2830.42 ml  Output   1175 ml  Net 1655.42 ml   BP 128/72  Pulse 78  Temp(Src) 98 F (36.7 C) (Oral)  Resp 18  Ht 6' (1.829 m)  Wt 117.482 kg (259 lb)  BMI 35.13 kg/m2  SpO2 96% Physical Exam: General appearance: Obese, alert, cooperative and no distress Head: Normocephalic, without obvious abnormality, atraumatic Neck: no adenopathy, no carotid bruit, no JVD, supple, symmetrical, trachea midline and thyroid not enlarged, symmetric, no tenderness/mass/nodules Lungs: clear to auscultation bilaterally Heart: regular rate and rhythm, S1, S2 normal, no murmur, click, rub or gallop Abdomen: soft, obese, non-tender; bowel sounds normal; no masses,  no organomegaly Extremities: extremities normal, atraumatic, no cyanosis or edema Skin: Skin color, texture, turgor normal. No rashes or lesions  Lab Results: Results for orders placed during the hospital encounter of 02/19/11 (from the past 48 hour(s))  BASIC METABOLIC PANEL     Status: Abnormal   Collection Time   02/22/11  3:29 PM      Component Value Range Comment   Sodium 136  135 - 145 (mEq/L)    Potassium 3.7  3.5 - 5.1 (mEq/L)    Chloride 97  96 - 112 (mEq/L)    CO2 31  19 - 32 (mEq/L)    Glucose, Bld 151 (*) 70 - 99 (mg/dL)    BUN 12  6 - 23 (mg/dL)    Creatinine, Ser 4.09  0.50 - 1.35 (mg/dL)    Calcium 9.4  8.4 - 10.5 (mg/dL)    GFR calc non Af Amer >90  >90 (mL/min)    GFR calc Af Amer >90  >90 (mL/min)   GLUCOSE, CAPILLARY     Status: Abnormal   Collection Time   02/22/11  4:59 PM      Component Value Range  Comment   Glucose-Capillary 203 (*) 70 - 99 (mg/dL)    Comment 1 Notify RN      Comment 2 Documented in Chart     GLUCOSE, CAPILLARY     Status: Abnormal   Collection Time   02/22/11  9:23 PM      Component Value Range Comment   Glucose-Capillary 168 (*) 70 - 99 (mg/dL)   BASIC METABOLIC PANEL     Status: Abnormal   Collection Time   02/23/11  3:43 AM      Component Value Range Comment   Sodium 135  135 - 145 (mEq/L)    Potassium 3.8  3.5 - 5.1 (mEq/L)    Chloride 98  96 - 112 (mEq/L)    CO2 30  19 - 32 (mEq/L)    Glucose, Bld 178 (*) 70 - 99 (mg/dL)    BUN 14  6 - 23 (mg/dL)    Creatinine, Ser 8.11  0.50 - 1.35 (mg/dL)    Calcium 9.0  8.4 - 10.5 (mg/dL)    GFR calc non Af Amer >90  >90 (mL/min)    GFR calc Af Amer >90  >90 (mL/min)   GLUCOSE, CAPILLARY     Status: Abnormal   Collection Time  02/23/11  7:40 AM      Component Value Range Comment   Glucose-Capillary 171 (*) 70 - 99 (mg/dL)   GLUCOSE, CAPILLARY     Status: Abnormal   Collection Time   02/23/11 12:57 PM      Component Value Range Comment   Glucose-Capillary 175 (*) 70 - 99 (mg/dL)   URINALYSIS, ROUTINE W REFLEX MICROSCOPIC     Status: Abnormal   Collection Time   02/23/11  2:20 PM      Component Value Range Comment   Color, Urine ORANGE (*) YELLOW  BIOCHEMICALS MAY BE AFFECTED BY COLOR   APPearance CLEAR  CLEAR     Specific Gravity, Urine 1.036 (*) 1.005 - 1.030     pH 6.0  5.0 - 8.0     Glucose, UA NEGATIVE  NEGATIVE (mg/dL)    Hgb urine dipstick NEGATIVE  NEGATIVE     Bilirubin Urine SMALL (*) NEGATIVE     Ketones, ur TRACE (*) NEGATIVE (mg/dL)    Protein, ur NEGATIVE  NEGATIVE (mg/dL)    Urobilinogen, UA 1.0  0.0 - 1.0 (mg/dL)    Nitrite NEGATIVE  NEGATIVE     Leukocytes, UA TRACE (*) NEGATIVE    URINE MICROSCOPIC-ADD ON     Status: Normal   Collection Time   02/23/11  2:20 PM      Component Value Range Comment   Squamous Epithelial / LPF RARE  RARE     WBC, UA 0-2  <3 (WBC/hpf)   GLUCOSE, CAPILLARY      Status: Abnormal   Collection Time   02/23/11  4:55 PM      Component Value Range Comment   Glucose-Capillary 137 (*) 70 - 99 (mg/dL)   GLUCOSE, CAPILLARY     Status: Abnormal   Collection Time   02/23/11  9:29 PM      Component Value Range Comment   Glucose-Capillary 142 (*) 70 - 99 (mg/dL)   CBC     Status: Abnormal   Collection Time   02/24/11  6:15 AM      Component Value Range Comment   WBC 3.4 (*) 4.0 - 10.5 (K/uL)    RBC 3.88 (*) 4.22 - 5.81 (MIL/uL)    Hemoglobin 11.0 (*) 13.0 - 17.0 (g/dL)    HCT 11.9 (*) 14.7 - 52.0 (%)    MCV 86.1  78.0 - 100.0 (fL)    MCH 28.4  26.0 - 34.0 (pg)    MCHC 32.9  30.0 - 36.0 (g/dL)    RDW 82.9  56.2 - 13.0 (%)    Platelets 66 (*) 150 - 400 (K/uL) CONSISTENT WITH PREVIOUS RESULT  DIFFERENTIAL     Status: Normal   Collection Time   02/24/11  6:15 AM      Component Value Range Comment   Neutrophils Relative 55  43 - 77 (%)    Neutro Abs 1.9  1.7 - 7.7 (K/uL)    Lymphocytes Relative 30  12 - 46 (%)    Lymphs Abs 1.0  0.7 - 4.0 (K/uL)    Monocytes Relative 11  3 - 12 (%)    Monocytes Absolute 0.4  0.1 - 1.0 (K/uL)    Eosinophils Relative 3  0 - 5 (%)    Eosinophils Absolute 0.1  0.0 - 0.7 (K/uL)    Basophils Relative 0  0 - 1 (%)    Basophils Absolute 0.0  0.0 - 0.1 (K/uL)   COMPREHENSIVE METABOLIC PANEL  Status: Abnormal   Collection Time   02/24/11  6:15 AM      Component Value Range Comment   Sodium 137  135 - 145 (mEq/L)    Potassium 3.5  3.5 - 5.1 (mEq/L)    Chloride 99  96 - 112 (mEq/L)    CO2 30  19 - 32 (mEq/L)    Glucose, Bld 132 (*) 70 - 99 (mg/dL)    BUN 13  6 - 23 (mg/dL)    Creatinine, Ser 1.19  0.50 - 1.35 (mg/dL)    Calcium 8.7  8.4 - 10.5 (mg/dL)    Total Protein 6.3  6.0 - 8.3 (g/dL)    Albumin 3.1 (*) 3.5 - 5.2 (g/dL)    AST 23  0 - 37 (U/L)    ALT 21  0 - 53 (U/L)    Alkaline Phosphatase 76  39 - 117 (U/L)    Total Bilirubin 0.9  0.3 - 1.2 (mg/dL)    GFR calc non Af Amer >90  >90 (mL/min)    GFR calc Af  Amer >90  >90 (mL/min)   GLUCOSE, CAPILLARY     Status: Abnormal   Collection Time   02/24/11  7:45 AM      Component Value Range Comment   Glucose-Capillary 126 (*) 70 - 99 (mg/dL)     Micro Results: Recent Results (from the past 240 hour(s))  SURGICAL PCR SCREEN     Status: Normal   Collection Time   02/19/11  1:30 PM      Component Value Range Status Comment   MRSA, PCR NEGATIVE  NEGATIVE  Final    Staphylococcus aureus NEGATIVE  NEGATIVE  Final     Studies/Results: Dg Lumbar Spine 2-3 Views  02/19/2011  *RADIOLOGY REPORT*  Clinical Data: Preoperative for back surgery.  Pain and tingling and numbness, extending down the left leg.  LUMBAR SPINE - 2-3 VIEW  Comparison: Multiple exams, including 12/05/2010 and 08/09/2010  Findings: Stable multilevel intervertebral spurring noted, with unchanged 3 mm of posterior subluxation of L4 on L5, and mild loss of intervertebral disc height at L5-S1.  A mildly transitional s one vertebra is observed.  The overall appearance is similar to the prior exam from 12/05/2010.  IMPRESSION:  1.  Lumbar spondylosis, with mild subluxation at L4-5.  Original Report Authenticated By: Dellia Cloud, M.D.   Dg Spine Portable 1 View  02/19/2011  *RADIOLOGY REPORT*  Clinical Data: L5-S1 decompression  PORTABLE SPINE - 1 VIEW  Comparison: Plain films 02/19/2011 and 03/31/1977  Findings: Single lateral lumbar view intraoperatively demonstrates a sharp tip probe at the L5-S1 disc space.  Skin spreaders at this same level.  IMPRESSION: Sharp tip probe at L5-S1.  Original Report Authenticated By: Genevive Bi, M.D.   Dg Spine Portable 1 View  02/19/2011  *RADIOLOGY REPORT*  Clinical Data: L5 S1 decompression.  Please number vertebral bodies for surgery.  PORTABLE SPINE - 1 VIEW  Comparison: Spot portable one-view 02/19/2011 at 16:53 hours.  Findings: Current radiograph is at 17:07 hours.  The more superior linear radiopaque foreign body projects over the posterior  elements of L5.  The more inferiorly positioned radiopaque foreign body projects over the posterior aspect of the S1 vertebral body.  IMPRESSION: Radiopaque instruments positioned as above.  Original Report Authenticated By: Britta Mccreedy, M.D.   Dg Spine Portable 1 View  02/19/2011  *RADIOLOGY REPORT*  Clinical Data: L5-S1 disc disease.  PORTABLE SPINE - 1 VIEW  Comparison: Radiographs dated 02/19/2011  Findings: The lower needle is at the level of the tip of the spinous process of S1.  The upper needle was between the spinous processes of L4 and L5.  IMPRESSION: Needles in position as described.  Original Report Authenticated By: Gwynn Burly, M.D.   Medications: Scheduled Meds:   . aspirin  325 mg Oral Daily  . atenolol  25 mg Oral QAC breakfast  . ciprofloxacin  400 mg Intravenous Q12H  . docusate sodium  100 mg Oral BID  . Flexpen Starter Kit  1 kit Other Once  . insulin aspart  0-20 Units Subcutaneous TID WC  . insulin aspart  0-5 Units Subcutaneous QHS  . insulin glargine  10 Units Subcutaneous Daily  . linagliptin  5 mg Oral Daily  . metFORMIN  1,000 mg Oral BID WC  . morphine  30 mg Oral BID  . pantoprazole  80 mg Oral Q1200  . selegiline  6 mg Transdermal QPM  . Tamsulosin HCl  0.4 mg Oral Daily   Continuous Infusions:   . sodium chloride 100 mL/hr at 02/24/11 0125   PRN Meds:.acetaminophen, acetaminophen, alprazolam, alum & mag hydroxide-simeth, HYDROmorphone, HYDROmorphone (DILAUDID) injection, HYDROmorphone, menthol-cetylpyridinium, ondansetron (ZOFRAN) IV, phenol, temazepam  Assessment/Plan: #1 status post lumbar decompression surgery secondary to HNP-management as per surgery #2 Questionable UTI/bladder outlet obstruction-patient is voiding urine without difficulty. Can continue Flomax #3 diabetes mellitus-hemoglobin A1c level 11.3 patient is being discharged home with Lantus insulin. #4 history of hepatitis B. #5 obesity #6 Thrombocytopenia--no active bleeding  seen. We sign off. Thank you   LOS: 5 days   Amairany Schumpert 02/24/2011, 1:31 PM

## 2011-02-24 NOTE — Plan of Care (Signed)
Problem: Discharge Progression Outcomes Goal: Barriers To Progression Addressed/Resolved Outcome: Not Progressing Pt refused lantus order and stated that would follow up with his primary doctor about that issue.

## 2011-02-26 NOTE — Discharge Summary (Signed)
Physician Discharge Summary  Patient ID: Victor [name] MRN: [id number] DOB/AGE: [birth year] 49 y.o.  Admit date: 02/19/2011 Discharge date: 02/26/2011  Admission Diagnoses: Spinal Stenosis .  Hepatitis B virus infection    .  Thrombocytopenia    .  Sciatica    .  Diabetes mellitus    .  Hyperlipidemia    .  Hypertension    .  GERD (gastroesophageal reflux disease)    .  Osteoarthritis    .  Anxiety    .  Depression    .  Recurrent upper respiratory infection (URI)      head cold last week- no fever- states resolved   .  Blood transfusion      platelets   .  Chronic kidney disease      kidney stones 2012   .  Hepatitis      hepatitis b/ newly diagnosed with portal hypertension   .  Splenomegaly      LOV Dr Gaylyn Rong 12/12 on chart      Discharge Diagnoses:  Spinal Stenosis s/p lumbar decompression Resolved urinary retention/poor output .  Hepatitis B virus infection    .  Thrombocytopenia    .  Sciatica    .  Diabetes mellitus    .  Hyperlipidemia    .  Hypertension    .  GERD (gastroesophageal reflux disease)    .  Osteoarthritis    .  Anxiety    .  Depression    .  Recurrent upper respiratory infection (URI)      head cold last week- no fever- states resolved   .  Blood transfusion      platelets   .  Chronic kidney disease      kidney stones 2012   .  Hepatitis      hepatitis b/ newly diagnosed with portal hypertension   .  Splenomegaly      LOV Dr Gaylyn Rong 12/12 on chart      Discharged Condition: good  Hospital Course:   Patient did well post operatively.  He did have some pain control issues initially, but this did improve.  Unfortunately he developed some urinary retention followed by poor urinary output.  This was addressed with gentle rehydration and slowly improved.  Patient has poorly controlled DM and was started on sliding scale insulin.  Diabetic coordinator recommended basal insulin but pt refused and will follow up with his PCP to discuss better glycemic  control.   We did consult the hospitalist for assistance.  The patient gradually improved and was stable for discharge to home.  Please see progress notes for further details.    Basename 02/24/11 0615  WBC 3.4*  HCT 33.4*  PLT 66*    Basename 02/24/11 0615  NA 137  K 3.5  CL 99  CO2 30  GLUCOSE 132*  BUN 13  CREATININE 0.76  CALCIUM 8.7    Procedure Note: Lumbar decompression L4-5 and L5-S1, left. . Foraminotomies of L5 and S1.   Microdiskectomy at L5-S1, hemilaminectomy of L5.    Consults: [facility]   Disposition: Home or Self Care  Discharge Orders    Future Appointments: Provider: Department: Dept Phone: Center:   03/23/2011 8:30 AM Jarrett Soho [facility] [phone] None   05/22/2011 8:30 AM Devonne Doughty D[facility]en [facility] [phone] None   07/23/2011 10:00 AM Leighton Ruff [facility] [phone] None   07/23/2011 10:30 AM Jethro Bolus, MD [facility] [phone] None  Future Orders Please Complete By Expires   Diet - low sodium heart healthy      Call MD / Call 911      Comments:   If you experience chest pain or shortness of breath, CALL 911 and be transported to the hospital emergency room.  If you develope a fever above 101 F, pus (white drainage) or increased drainage or redness at the wound, or calf pain, call your surgeon's office.   Constipation Prevention      Comments:   Drink plenty of fluids.  Prune juice may be helpful.  You may use a stool softener, such as Colace (over the counter) 100 mg twice a day.  Use MiraLax (over the counter) for constipation as needed.   Increase activity slowly as tolerated      Weight Bearing as taught in Physical Therapy      Comments:   Use a walker or crutches as instructed.   Discharge instructions      Comments:   Walk As Tolerated utilizing back precautions.  No bending, twisting, or lifting.  No driving for 2 weeks.  Ok to shower in 72 hours.     Discharge Medication List as of  02/24/2011 11:32 AM    START taking these medications   Details  aspirin 325 MG tablet Take 1 tablet (325 mg total) by mouth daily., Starting 02/22/2011, Until Fri 02/22/12, No Print    ciprofloxacin (CIPRO) 500 MG tablet Take 1 tablet (500 mg total) by mouth 2 (two) times daily., Starting 02/24/2011, Until Tue 03/06/11, Print    insulin glargine (LANTUS) 100 UNIT/ML injection Inject 10 Units into the skin daily., Starting 02/24/2011, Until Sun 02/24/12, Print    Tamsulosin HCl (FLOMAX) 0.4 MG CAPS Take 1 capsule (0.4 mg total) by mouth daily., Starting 02/22/2011, Until Discontinued, Phone In      CONTINUE these medications which have NOT CHANGED   Details  alprazolam (XANAX) 2 MG tablet Take 2 mg by mouth 3 (three) times daily as needed. For anxiety , Until Discontinued, Historical Med    atenolol (TENORMIN) 25 MG tablet Take 25 mg by mouth daily before breakfast. , Until Discontinued, Historical Med    esomeprazole (NEXIUM) 40 MG capsule Take 40 mg by mouth daily before breakfast. , Until Discontinued, Historical Med    fish oil-omega-3 fatty acids 1000 MG capsule Take 1 g by mouth daily. , Until Discontinued, Historical Med    metFORMIN (GLUCOPHAGE) 1000 MG tablet Take 1,000 mg by mouth 2 (two) times daily with a meal. , Until Discontinued, Historical Med    selegiline (EMSAM) 6 MG/24HR Place 1 patch onto the skin every evening. , Until Discontinued, Historical Med    sitaGLIPtin (JANUVIA) 100 MG tablet Take 100 mg by mouth daily. , Until Discontinued, Historical Med    HYDROmorphone (DILAUDID) 8 MG tablet Take 8 mg by mouth every 6 (six) hours.  , Until Discontinued, Historical Med    morphine (MS CONTIN) 30 MG 12 hr tablet Take 30 mg by mouth 2 (two) times daily.  , Until Discontinued, Historical Med       Follow-up Information    Follow up with BEANE,JEFFREY C in 12 days.   Contact information:   [facility] [address] [place]  [place] [address] [phone]       Follow up with Sissy Hoff, MD in 1 week.   Contact information:   [address] [place] [address] [phone]  Follow up with urology in 1 week.         Signed: Neilan Rizzo R. 02/26/2011, 8:06 AM

## 2011-03-07 NOTE — Progress Notes (Unsigned)
Faxed progress notes to Dr. Susa Raring @ [facility] [phone],  [phone]).

## 2011-03-23 ENCOUNTER — Other Ambulatory Visit: Payer: BC Managed Care – PPO

## 2011-04-02 ENCOUNTER — Other Ambulatory Visit: Payer: Self-pay | Admitting: Gastroenterology

## 2011-05-21 ENCOUNTER — Telehealth: Payer: Self-pay | Admitting: Oncology

## 2011-05-21 NOTE — Telephone Encounter (Signed)
pts wife called and cancelled appts. wife state Dr. Gaylyn Rong informed him to see primary

## 2011-05-22 ENCOUNTER — Other Ambulatory Visit: Payer: BC Managed Care – PPO | Admitting: Lab

## 2011-07-23 ENCOUNTER — Ambulatory Visit: Payer: BC Managed Care – PPO | Admitting: Oncology

## 2011-07-23 ENCOUNTER — Other Ambulatory Visit: Payer: BC Managed Care – PPO | Admitting: Lab

## 2012-01-01 ENCOUNTER — Other Ambulatory Visit (HOSPITAL_COMMUNITY): Payer: Self-pay | Admitting: Physical Medicine and Rehabilitation

## 2012-01-01 DIAGNOSIS — S32009A Unspecified fracture of unspecified lumbar vertebra, initial encounter for closed fracture: Secondary | ICD-10-CM

## 2012-01-11 ENCOUNTER — Ambulatory Visit (HOSPITAL_COMMUNITY): Admission: RE | Admit: 2012-01-11 | Payer: BC Managed Care – PPO | Source: Ambulatory Visit

## 2012-01-15 ENCOUNTER — Other Ambulatory Visit (HOSPITAL_COMMUNITY): Payer: Self-pay | Admitting: Physical Medicine and Rehabilitation

## 2012-01-15 DIAGNOSIS — IMO0002 Reserved for concepts with insufficient information to code with codable children: Secondary | ICD-10-CM

## 2012-01-16 ENCOUNTER — Ambulatory Visit (HOSPITAL_COMMUNITY): Admission: RE | Admit: 2012-01-16 | Payer: BC Managed Care – PPO | Source: Ambulatory Visit

## 2012-01-17 ENCOUNTER — Ambulatory Visit (HOSPITAL_COMMUNITY)
Admission: RE | Admit: 2012-01-17 | Discharge: 2012-01-17 | Disposition: A | Discharge status: Home / Self Care | Payer: BC Managed Care – PPO | Source: Ambulatory Visit | Attending: Physical Medicine and Rehabilitation | Admitting: Physical Medicine and Rehabilitation

## 2012-01-17 ENCOUNTER — Other Ambulatory Visit (HOSPITAL_COMMUNITY): Payer: Self-pay | Admitting: Physical Medicine and Rehabilitation

## 2012-01-17 DIAGNOSIS — IMO0002 Reserved for concepts with insufficient information to code with codable children: Secondary | ICD-10-CM

## 2012-03-03 ENCOUNTER — Other Ambulatory Visit (HOSPITAL_COMMUNITY): Payer: Self-pay | Admitting: Interventional Radiology

## 2012-03-03 DIAGNOSIS — IMO0002 Reserved for concepts with insufficient information to code with codable children: Secondary | ICD-10-CM

## 2012-03-07 ENCOUNTER — Other Ambulatory Visit (HOSPITAL_COMMUNITY): Payer: Self-pay | Admitting: Physician Assistant

## 2012-03-07 ENCOUNTER — Other Ambulatory Visit: Payer: Self-pay | Admitting: Radiology

## 2012-03-10 ENCOUNTER — Encounter (HOSPITAL_COMMUNITY): Payer: Self-pay | Admitting: Pharmacy Technician

## 2012-03-11 ENCOUNTER — Ambulatory Visit (HOSPITAL_COMMUNITY)
Admission: RE | Admit: 2012-03-11 | Discharge: 2012-03-11 | Disposition: A | Discharge status: Home / Self Care | Payer: BC Managed Care – PPO | Source: Ambulatory Visit | Attending: Interventional Radiology | Admitting: Interventional Radiology

## 2012-03-11 DIAGNOSIS — R799 Abnormal finding of blood chemistry, unspecified: Secondary | ICD-10-CM | POA: Insufficient documentation

## 2012-03-11 DIAGNOSIS — X58XXXA Exposure to other specified factors, initial encounter: Secondary | ICD-10-CM | POA: Insufficient documentation

## 2012-03-11 DIAGNOSIS — Z5309 Procedure and treatment not carried out because of other contraindication: Secondary | ICD-10-CM | POA: Insufficient documentation

## 2012-03-11 DIAGNOSIS — IMO0002 Reserved for concepts with insufficient information to code with codable children: Secondary | ICD-10-CM

## 2012-03-11 DIAGNOSIS — S32009A Unspecified fracture of unspecified lumbar vertebra, initial encounter for closed fracture: Secondary | ICD-10-CM | POA: Insufficient documentation

## 2012-03-11 LAB — BASIC METABOLIC PANEL
BUN: 14 mg/dL (ref 6–23)
CO2: 23 mEq/L (ref 19–32)
Calcium: 8.9 mg/dL (ref 8.4–10.5)
Chloride: 103 mEq/L (ref 96–112)
Creatinine, Ser: 0.73 mg/dL (ref 0.50–1.35)
GFR calc Af Amer: >90 mL/min (ref >90)
GFR calc non Af Amer: >90 mL/min (ref >90)
Glucose, Bld: 163 mg/dL — ABNORMAL HIGH (ref 70–99)
Potassium: 3.8 mEq/L (ref 3.5–5.1)
Sodium: 138 mEq/L (ref 135–145)

## 2012-03-11 LAB — CBC WITH DIFFERENTIAL/PLATELET
Basophils Absolute: 0 10*3/uL (ref 0.0–0.1)
Basophils Relative: 0 % (ref 0–1)
Eosinophils Absolute: 0.1 10*3/uL (ref 0.0–0.7)
Eosinophils Relative: 3 % (ref 0–5)
HCT: 36 % — ABNORMAL LOW (ref 39.0–52.0)
Hemoglobin: 12.5 g/dL — ABNORMAL LOW (ref 13.0–17.0)
Lymphocytes Relative: 26 % (ref 12–46)
Lymphs Abs: 0.8 10*3/uL (ref 0.7–4.0)
MCH: 29.3 pg (ref 26.0–34.0)
MCHC: 34.7 g/dL (ref 30.0–36.0)
MCV: 84.3 fL (ref 78.0–100.0)
Monocytes Absolute: 0.5 10*3/uL (ref 0.1–1.0)
Monocytes Relative: 16 % — ABNORMAL HIGH (ref 3–12)
Neutro Abs: 1.8 10*3/uL (ref 1.7–7.7)
Neutrophils Relative %: 55 % (ref 43–77)
Platelets: 52 10*3/uL — ABNORMAL LOW (ref 150–400)
RBC: 4.27 MIL/uL (ref 4.22–5.81)
RDW: 14.1 % (ref 11.5–15.5)
WBC: 3.2 10*3/uL — ABNORMAL LOW (ref 4.0–10.5)

## 2012-03-11 LAB — GLUCOSE, CAPILLARY: Glucose-Capillary: 153 mg/dL — ABNORMAL HIGH (ref 70–99)

## 2012-03-11 LAB — APTT: aPTT: 31 seconds (ref 24–37)

## 2012-03-11 LAB — PROTIME-INR
INR: 1.24 (ref 0.00–1.49)
Prothrombin Time: 15.4 seconds — ABNORMAL HIGH (ref 11.6–15.2)

## 2012-03-11 MED ORDER — CEFAZOLIN SODIUM-DEXTROSE 2-3 GM-% IV SOLR
2.0000 g | Freq: Once | INTRAVENOUS | Status: DC
  Filled 2012-03-11: qty 50

## 2012-03-11 MED ORDER — SODIUM CHLORIDE 0.9 % IV SOLN: INTRAVENOUS | Status: DC

## 2012-03-11 NOTE — Progress Notes (Signed)
Pt here for L5 VP. States between time of consult and today, he did suffer a fall down some stairs. Had new xrays at [facility]. He doesn't know specifics but doesn't recall being told of a new fracture.  He denies new pain. He also has hx of thrombocytopenia and was previously followed by Dr. Gaylyn Rong, but states he hasn't seen him in a while.  Labs today show PLTs of 52k.  Will reschedule procedure with plan for PLTs transfusion in short stay prior to procedure. We will also review his new films from ortho in the meantime. D/W Dr. Corliss Skains.  [place], [facility]

## 2012-03-12 ENCOUNTER — Other Ambulatory Visit: Payer: Self-pay | Admitting: Radiology

## 2012-03-13 ENCOUNTER — Ambulatory Visit (HOSPITAL_COMMUNITY)
Admission: RE | Admit: 2012-03-13 | Discharge: 2012-03-13 | Disposition: A | Discharge status: Home / Self Care | Payer: BC Managed Care – PPO | Source: Ambulatory Visit | Attending: Interventional Radiology | Admitting: Interventional Radiology

## 2012-03-13 ENCOUNTER — Encounter (HOSPITAL_COMMUNITY): Payer: Self-pay

## 2012-03-13 ENCOUNTER — Other Ambulatory Visit (HOSPITAL_COMMUNITY): Payer: Self-pay | Admitting: Interventional Radiology

## 2012-03-13 ENCOUNTER — Ambulatory Visit (HOSPITAL_COMMUNITY): Payer: BC Managed Care – PPO

## 2012-03-13 VITALS — BP 130/79 | HR 71 | Temp 97.7°F | Resp 18 | Ht 72.0 in | Wt 280.0 lb

## 2012-03-13 DIAGNOSIS — IMO0002 Reserved for concepts with insufficient information to code with codable children: Secondary | ICD-10-CM

## 2012-03-13 DIAGNOSIS — E119 Type 2 diabetes mellitus without complications: Secondary | ICD-10-CM | POA: Insufficient documentation

## 2012-03-13 DIAGNOSIS — N186 End stage renal disease: Secondary | ICD-10-CM

## 2012-03-13 DIAGNOSIS — M8448XA Pathological fracture, other site, initial encounter for fracture: Secondary | ICD-10-CM | POA: Insufficient documentation

## 2012-03-13 DIAGNOSIS — M1990 Unspecified osteoarthritis, unspecified site: Secondary | ICD-10-CM | POA: Insufficient documentation

## 2012-03-13 DIAGNOSIS — E785 Hyperlipidemia, unspecified: Secondary | ICD-10-CM | POA: Insufficient documentation

## 2012-03-13 DIAGNOSIS — I10 Essential (primary) hypertension: Secondary | ICD-10-CM | POA: Insufficient documentation

## 2012-03-13 LAB — CBC WITH DIFFERENTIAL/PLATELET
Basophils Absolute: 0 10*3/uL (ref 0.0–0.1)
Basophils Relative: 0 % (ref 0–1)
Eosinophils Absolute: 0.1 10*3/uL (ref 0.0–0.7)
Eosinophils Relative: 3 % (ref 0–5)
HCT: 36.4 % — ABNORMAL LOW (ref 39.0–52.0)
Hemoglobin: 12.6 g/dL — ABNORMAL LOW (ref 13.0–17.0)
Lymphocytes Relative: 34 % (ref 12–46)
Lymphs Abs: 1.2 10*3/uL (ref 0.7–4.0)
MCH: 29 pg (ref 26.0–34.0)
MCHC: 34.6 g/dL (ref 30.0–36.0)
MCV: 83.9 fL (ref 78.0–100.0)
Monocytes Absolute: 0.4 10*3/uL (ref 0.1–1.0)
Monocytes Relative: 11 % (ref 3–12)
Neutro Abs: 2 10*3/uL (ref 1.7–7.7)
Neutrophils Relative %: 53 % (ref 43–77)
Platelets: 51 10*3/uL — ABNORMAL LOW (ref 150–400)
RBC: 4.34 MIL/uL (ref 4.22–5.81)
RDW: 13.9 % (ref 11.5–15.5)
WBC: 3.7 10*3/uL — ABNORMAL LOW (ref 4.0–10.5)

## 2012-03-13 LAB — TYPE AND SCREEN
ABO/RH(D): A POS
Antibody Screen: NEGATIVE

## 2012-03-13 LAB — ABO/RH: ABO/RH(D): A POS

## 2012-03-13 LAB — GLUCOSE, CAPILLARY: Glucose-Capillary: 154 mg/dL — ABNORMAL HIGH (ref 70–99)

## 2012-03-13 MED ORDER — HYDROMORPHONE HCL PF 1 MG/ML IJ SOLN
INTRAMUSCULAR | Status: AC
  Filled 2012-03-13: qty 2

## 2012-03-13 MED ORDER — FENTANYL CITRATE 0.05 MG/ML IJ SOLN
INTRAMUSCULAR | Status: AC
  Filled 2012-03-13: qty 4

## 2012-03-13 MED ORDER — MIDAZOLAM HCL 2 MG/2ML IJ SOLN
INTRAMUSCULAR | Status: AC | PRN
  Administered 2012-03-13 (×3): 1 mg via INTRAVENOUS

## 2012-03-13 MED ORDER — SODIUM CHLORIDE 0.9 % IV SOLN: INTRAVENOUS | Status: AC

## 2012-03-13 MED ORDER — FLUMAZENIL 0.5 MG/5ML IV SOLN
INTRAVENOUS | Status: AC
  Filled 2012-03-13: qty 5

## 2012-03-13 MED ORDER — HYDROMORPHONE HCL PF 1 MG/ML IJ SOLN
INTRAMUSCULAR | Status: AC | PRN
  Administered 2012-03-13 (×2): 1 mg

## 2012-03-13 MED ORDER — FENTANYL CITRATE 0.05 MG/ML IJ SOLN
INTRAMUSCULAR | Status: AC | PRN
  Administered 2012-03-13 (×4): 25 ug via INTRAVENOUS

## 2012-03-13 MED ORDER — MIDAZOLAM HCL 2 MG/2ML IJ SOLN
INTRAMUSCULAR | Status: AC
  Filled 2012-03-13: qty 4

## 2012-03-13 MED ORDER — SODIUM CHLORIDE 0.9 % IV SOLN: INTRAVENOUS | Status: DC

## 2012-03-13 MED ORDER — TOBRAMYCIN SULFATE 1.2 G IJ SOLR
INTRAMUSCULAR | Status: AC
  Filled 2012-03-13: qty 1.2

## 2012-03-13 MED ORDER — IOHEXOL 300 MG/ML  SOLN
50.0000 mL | Freq: Once | INTRAMUSCULAR | Status: AC | PRN
  Administered 2012-03-13: 5 mL via INTRAVENOUS

## 2012-03-13 MED ORDER — CEFAZOLIN SODIUM-DEXTROSE 2-3 GM-% IV SOLR
2.0000 g | Freq: Once | INTRAVENOUS | Status: AC
  Administered 2012-03-13: 2 g via INTRAVENOUS
  Filled 2012-03-13: qty 50

## 2012-03-13 NOTE — ED Notes (Signed)
Arrived in IR.  Platelets infusing at 50 ml.  Increased to 428ml/hr.  Spoke with Dr Corliss Skains, increase rate to infuse prior to procedure. Tolerating them well.  VSS

## 2012-03-13 NOTE — H&P (Signed)
HPI: Victor [name] is an 52 y.o. male who was seen in consult by Dr. Corliss Skains back in December for L5 pedicle fracture. He is now scheduled for vertebral augmentation as treatment. He has a history of splenomegaly and thrombocytopenia and when he came in a few days ago, his PLTs were only 52. He is now rescheduled for today with plans for PLT infusion prior to procedure. PMHx and meds reviewed. No c/o fever or recent illness.  Past Medical History:  Past Medical History  Diagnosis Date  . Hepatitis B virus infection   . Thrombocytopenia   . Sciatica   . Diabetes mellitus   . Hyperlipidemia   . Hypertension   . GERD (gastroesophageal reflux disease)   . Osteoarthritis   . Anxiety   . Depression   . Recurrent upper respiratory infection (URI)     head cold last week- no fever- states resolved  . Blood transfusion     platelets  . Chronic kidney disease     kidney stones 2012  . Hepatitis     hepatitis b/ newly diagnosed with portal hypertension  . Splenomegaly     LOV Dr Gaylyn Rong  12/12 on chart    Past Surgical History:  Past Surgical History  Procedure Date  . Hand surgery     right hand  . Shoulder surgery     left shoulder  . Knee surgery     5 surgeries to right knee, 2 surgeries to left knee  . Lumbar laminectomy/decompression microdiscectomy 02/19/2011    Procedure: LUMBAR LAMINECTOMY/DECOMPRESSION MICRODISCECTOMY;  Surgeon: Javier Docker;  Location: [facility];  Service: Orthopedics;  Laterality: Left;  decompression l5-s1 l4-5 on left    Family History: No family history on file.  Social History:  reports that he quit smoking about 26 years ago. He does not have any smokeless tobacco history on file. He reports that he does not drink alcohol or use illicit drugs.  Allergies:  Allergies  Allergen Reactions  . Percocet (Oxycodone-Acetaminophen) Anaphylaxis  . Demerol (Meperidine Hcl) Other (See Comments)    REACT TO ANOTHER MEDICATION PT IS TAKEN  . Flexeril  (Cyclobenzaprine Hcl) Other (See Comments)    REACT TO ANOTHER MEDICATION PT IS TAKEN  . Vicodin (Hydrocodone-Acetaminophen) Other (See Comments)    PT TURN RED AND GETS HOT    Medications: alprazolam (XANAX) 2 MG tablet (Taking) Sig - Route: Take 2 mg by mouth 3 (three) times daily as needed. For anxiety  - Oral Class: Historical Med Number of times this order has been changed since signing: 4 [address] atenolol (TENORMIN) 25 MG tablet (Taking) Sig - Route: Take 25 mg by mouth daily before breakfast. - Oral Class: Historical Med Number of times this order has been changed since signing: 3 [address] clobetasol cream (TEMOVATE) 0.05 % (Taking) Sig - Route: Apply 1 application topically 2 (two) times daily. - Topical Class: Historical Med esomeprazole (NEXIUM) 40 MG capsule (Taking) Sig - Route: Take 40 mg by mouth daily before breakfast. - Oral Class: Historical Med Number of times this order has been changed since signing: 1 [address] fish oil-omega-3 fatty acids 1000 MG capsule (Taking) Sig - Route: Take 1 g by mouth daily. - Oral Class: Historical Med Number of times this order has been changed since signing: 2 Order [address] glipiZIDE (GLUCOTROL XL) 2.5 MG 24 hr tablet (Taking) Sig - Route: Take 2.5 mg by mouth daily. - Oral Class: Historical Med HYDROmorphone (  DILAUDID) 8 MG tablet (Taking) Sig - Route: Take 8 mg by mouth every 6 (six) hours as needed. For pain - Oral Class: Historical Med lisinopril (PRINIVIL,ZESTRIL) 2.5 MG tablet (Taking) Sig - Route: Take 2.5 mg by mouth daily. - Oral Class: Historical Med metFORMIN (GLUCOPHAGE) 1000 MG tablet (Taking) Sig - Route: Take 1,000 mg by mouth 2 (two) times daily with a meal. - Oral Class: Historical Med Number of times this order has been changed since signing: 1 Order [address] selegiline (EMSAM) 6 MG/24HR (Taking) Sig - Route: Place 1 patch onto the skin every evening. - Transdermal Class: Historical Med Number of times this  order has been changed since signing: 3 Order [address] sitaGLIPtin (JANUVIA) 100 MG tablet (Taking) Sig - Route: Take 100 mg by mouth daily. - Oral Class: Historical Med Number of times this order has been changed since signing: 3 Order [address] Tamsulosin HCl (FLOMAX) 0.4 MG CAPS (Taking) 30 capsule 0 02/22/2011 Sig - Route: Take 1 capsule (0.4 mg total) by mouth daily. - Oral Class: Phone In Number of times this order has been changed since signing: 1 Order [address] vitamin E 400 UNIT capsule (Taking) Sig - Route: Take 400 Units by mouth daily. - Oral Class: Historical Med insulin glargine (LANTUS) 100 UNIT/ML injection 10 mL 0 02/24/2011 04/10/2012 Sig - Route: Inject 10 Units into the skin daily. - Subcutaneous Class: Print   Please HPI for pertinent positives, otherwise complete 10 system ROS negative.  Physical Exam: Blood pressure 121/75, pulse 66, temperature 97.9 F (36.6 C), temperature source Oral, resp. rate 18, height 6' (1.829 m), weight 280 lb (127.007 kg), SpO2 97.00%. Body mass index is 37.97 kg/(m^2).   General Appearance:  Alert, cooperative, no distress, obese  Head:  Normocephalic, without obvious abnormality, atraumatic  ENT: Unremarkable  Neck: Supple, symmetrical, trachea midline, no adenopathy, thyroid: not enlarged, symmetric, no tenderness/mass/nodules  Lungs:   Clear to auscultation bilaterally, no w/r/r, respirations unlabored without use of accessory muscles.  Chest Wall:  No tenderness or deformity  Heart:  Regular rate and rhythm, S1, S2 normal, no murmur, rub or gallop. Carotids 2+ without bruit.  Neurologic: Normal affect, no gross deficits.   Results for orders placed during the hospital encounter of 03/13/12 (from the past 48 hour(s))  PREPARE PLATELET PHERESIS     Status: Normal (Preliminary result)   Collection Time   03/13/12 10:15 AM      Component Value Range Comment   Unit Number [id number]      Blood Component Type PLTPHER LR1      Unit  division 00      Status of Unit ALLOCATED      Transfusion Status OK TO TRANSFUSE     CBC WITH DIFFERENTIAL     Status: Abnormal   Collection Time   03/13/12 10:28 AM      Component Value Range Comment   WBC 3.7 (*) 4.0 - 10.5 K/uL    RBC 4.34  4.22 - 5.81 MIL/uL    Hemoglobin 12.6 (*) 13.0 - 17.0 g/dL    HCT 11.9 (*) 14.7 - 52.0 %    MCV 83.9  78.0 - 100.0 fL    MCH 29.0  26.0 - 34.0 pg    MCHC 34.6  30.0 - 36.0 g/dL    RDW 82.9  56.2 - 13.0 %    Platelets 51 (*) 150 - 400 K/uL CONSISTENT WITH PREVIOUS RESULT   Neutrophils Relative 53  43 -  77 %    Neutro Abs 2.0  1.7 - 7.7 K/uL    Lymphocytes Relative 34  12 - 46 %    Lymphs Abs 1.2  0.7 - 4.0 K/uL    Monocytes Relative 11  3 - 12 %    Monocytes Absolute 0.4  0.1 - 1.0 K/uL    Eosinophils Relative 3  0 - 5 %    Eosinophils Absolute 0.1  0.0 - 0.7 K/uL    Basophils Relative 0  0 - 1 %    Basophils Absolute 0.0  0.0 - 0.1 K/uL   TYPE AND SCREEN     Status: Normal (Preliminary result)   Collection Time   03/13/12 10:30 AM      Component Value Range Comment   ABO/RH(D) A POS      Antibody Screen PENDING      Sample Expiration 03/16/2012      Prothrombin Time/INR 15.4/1.24  Assessment/Plan L5 pedicle fracture Thrombocytopenia Discussed procedure including risks, complications, and aftercare with pt. Discussed use of sedation, side effects. PLTs 51k today, we will transfuse 1 PLT pack ~59min prior to procedure. Remaining labs ok. Consent signed in chart  Brayton El PA-C 03/13/2012, 11:24 AM

## 2012-03-13 NOTE — ED Notes (Signed)
Pt states he takes Morphine 60mg  pills and Dilaudid 8mg  TID at home for chronic back pain.

## 2012-03-13 NOTE — Procedures (Signed)
S/P L5 VP with biopsy

## 2012-03-13 NOTE — ED Notes (Signed)
Received to IR 2 with platelets infusing at 50 ml/hr.  VSS  Tolerating them well.  Rate increased to 44ml/hr to infuse prior to case.

## 2012-03-13 NOTE — ED Notes (Signed)
Platelets done infusing- tolerated well, VS stable

## 2012-03-14 LAB — PREPARE PLATELET PHERESIS: Unit division: 0

## 2012-03-27 ENCOUNTER — Ambulatory Visit (HOSPITAL_COMMUNITY): Admission: RE | Admit: 2012-03-27 | Payer: BC Managed Care – PPO | Source: Ambulatory Visit

## 2012-04-03 ENCOUNTER — Ambulatory Visit (HOSPITAL_COMMUNITY)
Admission: RE | Admit: 2012-04-03 | Discharge: 2012-04-03 | Disposition: A | Discharge status: Home / Self Care | Payer: BC Managed Care – PPO | Source: Ambulatory Visit | Attending: Interventional Radiology | Admitting: Interventional Radiology

## 2012-04-03 DIAGNOSIS — N186 End stage renal disease: Secondary | ICD-10-CM

## 2012-04-23 ENCOUNTER — Other Ambulatory Visit (HOSPITAL_COMMUNITY): Payer: Self-pay | Admitting: Interventional Radiology

## 2012-04-23 DIAGNOSIS — IMO0002 Reserved for concepts with insufficient information to code with codable children: Secondary | ICD-10-CM

## 2012-05-07 ENCOUNTER — Ambulatory Visit (HOSPITAL_COMMUNITY)
Admission: RE | Admit: 2012-05-07 | Discharge: 2012-05-07 | Disposition: A | Discharge status: Home / Self Care | Payer: BC Managed Care – PPO | Source: Ambulatory Visit | Attending: Interventional Radiology | Admitting: Interventional Radiology

## 2012-05-07 DIAGNOSIS — IMO0002 Reserved for concepts with insufficient information to code with codable children: Secondary | ICD-10-CM

## 2012-05-09 ENCOUNTER — Telehealth (HOSPITAL_COMMUNITY): Payer: Self-pay | Admitting: Interventional Radiology

## 2012-05-26 ENCOUNTER — Other Ambulatory Visit (HOSPITAL_COMMUNITY): Payer: Self-pay | Admitting: Interventional Radiology

## 2012-05-26 ENCOUNTER — Other Ambulatory Visit: Payer: Self-pay | Admitting: Radiology

## 2012-05-26 DIAGNOSIS — IMO0002 Reserved for concepts with insufficient information to code with codable children: Secondary | ICD-10-CM

## 2012-05-27 ENCOUNTER — Ambulatory Visit (HOSPITAL_COMMUNITY)
Admission: RE | Admit: 2012-05-27 | Discharge: 2012-05-27 | Disposition: A | Discharge status: Home / Self Care | Payer: BC Managed Care – PPO | Source: Ambulatory Visit | Attending: Interventional Radiology | Admitting: Interventional Radiology

## 2012-05-27 ENCOUNTER — Encounter (HOSPITAL_COMMUNITY): Payer: Self-pay

## 2012-05-27 ENCOUNTER — Other Ambulatory Visit (HOSPITAL_COMMUNITY): Payer: Self-pay | Admitting: Interventional Radiology

## 2012-05-27 DIAGNOSIS — F329 Major depressive disorder, single episode, unspecified: Secondary | ICD-10-CM | POA: Insufficient documentation

## 2012-05-27 DIAGNOSIS — E119 Type 2 diabetes mellitus without complications: Secondary | ICD-10-CM | POA: Insufficient documentation

## 2012-05-27 DIAGNOSIS — Z885 Allergy status to narcotic agent status: Secondary | ICD-10-CM | POA: Insufficient documentation

## 2012-05-27 DIAGNOSIS — F411 Generalized anxiety disorder: Secondary | ICD-10-CM | POA: Insufficient documentation

## 2012-05-27 DIAGNOSIS — M1990 Unspecified osteoarthritis, unspecified site: Secondary | ICD-10-CM | POA: Insufficient documentation

## 2012-05-27 DIAGNOSIS — M545 Low back pain, unspecified: Secondary | ICD-10-CM | POA: Insufficient documentation

## 2012-05-27 DIAGNOSIS — Z888 Allergy status to other drugs, medicaments and biological substances status: Secondary | ICD-10-CM | POA: Insufficient documentation

## 2012-05-27 DIAGNOSIS — D696 Thrombocytopenia, unspecified: Secondary | ICD-10-CM | POA: Insufficient documentation

## 2012-05-27 DIAGNOSIS — R161 Splenomegaly, not elsewhere classified: Secondary | ICD-10-CM | POA: Insufficient documentation

## 2012-05-27 DIAGNOSIS — I10 Essential (primary) hypertension: Secondary | ICD-10-CM | POA: Insufficient documentation

## 2012-05-27 DIAGNOSIS — F3289 Other specified depressive episodes: Secondary | ICD-10-CM | POA: Insufficient documentation

## 2012-05-27 DIAGNOSIS — B1910 Unspecified viral hepatitis B without hepatic coma: Secondary | ICD-10-CM | POA: Insufficient documentation

## 2012-05-27 DIAGNOSIS — IMO0002 Reserved for concepts with insufficient information to code with codable children: Secondary | ICD-10-CM | POA: Insufficient documentation

## 2012-05-27 DIAGNOSIS — E785 Hyperlipidemia, unspecified: Secondary | ICD-10-CM | POA: Insufficient documentation

## 2012-05-27 DIAGNOSIS — X58XXXS Exposure to other specified factors, sequela: Secondary | ICD-10-CM | POA: Insufficient documentation

## 2012-05-27 LAB — PROTIME-INR
INR: 1.15 (ref 0.00–1.49)
Prothrombin Time: 14.5 seconds (ref 11.6–15.2)

## 2012-05-27 LAB — GLUCOSE, CAPILLARY: Glucose-Capillary: 110 mg/dL — ABNORMAL HIGH (ref 70–99)

## 2012-05-27 LAB — CBC WITH DIFFERENTIAL/PLATELET
Basophils Absolute: 0 10*3/uL (ref 0.0–0.1)
Basophils Relative: 0 % (ref 0–1)
Eosinophils Absolute: 0.1 10*3/uL (ref 0.0–0.7)
Eosinophils Relative: 2 % (ref 0–5)
HCT: 35.6 % — ABNORMAL LOW (ref 39.0–52.0)
Hemoglobin: 12.5 g/dL — ABNORMAL LOW (ref 13.0–17.0)
Lymphocytes Relative: 33 % (ref 12–46)
Lymphs Abs: 1.1 10*3/uL (ref 0.7–4.0)
MCH: 28.7 pg (ref 26.0–34.0)
MCHC: 35.1 g/dL (ref 30.0–36.0)
MCV: 81.7 fL (ref 78.0–100.0)
Monocytes Absolute: 0.4 10*3/uL (ref 0.1–1.0)
Monocytes Relative: 10 % (ref 3–12)
Neutro Abs: 1.8 10*3/uL (ref 1.7–7.7)
Neutrophils Relative %: 54 % (ref 43–77)
Platelets: 58 10*3/uL — ABNORMAL LOW (ref 150–400)
RBC: 4.36 MIL/uL (ref 4.22–5.81)
RDW: 14.6 % (ref 11.5–15.5)
WBC: 3.4 10*3/uL — ABNORMAL LOW (ref 4.0–10.5)

## 2012-05-27 LAB — COMPREHENSIVE METABOLIC PANEL
ALT: 24 U/L (ref 0–53)
AST: 27 U/L (ref 0–37)
Albumin: 3.3 g/dL — ABNORMAL LOW (ref 3.5–5.2)
Alkaline Phosphatase: 68 U/L (ref 39–117)
BUN: 14 mg/dL (ref 6–23)
CO2: 24 mEq/L (ref 19–32)
Calcium: 9.3 mg/dL (ref 8.4–10.5)
Chloride: 105 mEq/L (ref 96–112)
Creatinine, Ser: 0.8 mg/dL (ref 0.50–1.35)
GFR calc Af Amer: >90 mL/min (ref >90)
GFR calc non Af Amer: >90 mL/min (ref >90)
Glucose, Bld: 120 mg/dL — ABNORMAL HIGH (ref 70–99)
Potassium: 3.6 mEq/L (ref 3.5–5.1)
Sodium: 139 mEq/L (ref 135–145)
Total Bilirubin: 0.5 mg/dL (ref 0.3–1.2)
Total Protein: 6.7 g/dL (ref 6.0–8.3)

## 2012-05-27 LAB — APTT: aPTT: 34 seconds (ref 24–37)

## 2012-05-27 MED ORDER — IOHEXOL 300 MG/ML  SOLN
50.0000 mL | Freq: Once | INTRAMUSCULAR | Status: AC | PRN
  Administered 2012-05-27: 3 mL via INTRAVENOUS

## 2012-05-27 MED ORDER — OXYCODONE-ACETAMINOPHEN 5-325 MG PO TABS
ORAL_TABLET | ORAL | Status: AC
  Administered 2012-05-27: 2 via ORAL
  Filled 2012-05-27: qty 2

## 2012-05-27 MED ORDER — SODIUM CHLORIDE 0.9 % IV SOLN: INTRAVENOUS | Status: AC

## 2012-05-27 MED ORDER — SODIUM CHLORIDE 0.9 % IV SOLN: Freq: Once | INTRAVENOUS | Status: DC

## 2012-05-27 MED ORDER — FENTANYL CITRATE 0.05 MG/ML IJ SOLN
INTRAMUSCULAR | Status: DC | PRN
  Administered 2012-05-27 (×2): 25 ug via INTRAVENOUS

## 2012-05-27 MED ORDER — HYDROMORPHONE HCL PF 1 MG/ML IJ SOLN
INTRAMUSCULAR | Status: DC | PRN
  Administered 2012-05-27: 1 mg

## 2012-05-27 MED ORDER — TOBRAMYCIN SULFATE 1.2 G IJ SOLR
INTRAMUSCULAR | Status: AC
  Filled 2012-05-27: qty 1.2

## 2012-05-27 MED ORDER — MIDAZOLAM HCL 2 MG/2ML IJ SOLN
INTRAMUSCULAR | Status: AC
  Filled 2012-05-27: qty 6

## 2012-05-27 MED ORDER — FENTANYL CITRATE 0.05 MG/ML IJ SOLN
INTRAMUSCULAR | Status: AC
  Filled 2012-05-27: qty 4

## 2012-05-27 MED ORDER — MIDAZOLAM HCL 2 MG/2ML IJ SOLN
INTRAMUSCULAR | Status: DC | PRN
  Administered 2012-05-27 (×2): 1 mg via INTRAVENOUS

## 2012-05-27 MED ORDER — CEFAZOLIN SODIUM-DEXTROSE 2-3 GM-% IV SOLR
2.0000 g | Freq: Once | INTRAVENOUS | Status: AC
  Administered 2012-05-27: 2 g via INTRAVENOUS
  Filled 2012-05-27: qty 50

## 2012-05-27 MED ORDER — HYDROMORPHONE HCL PF 1 MG/ML IJ SOLN
INTRAMUSCULAR | Status: AC
  Filled 2012-05-27: qty 1

## 2012-05-27 MED ORDER — HYDROMORPHONE HCL PF 1 MG/ML IJ SOLN
INTRAMUSCULAR | Status: AC
  Filled 2012-05-27: qty 3

## 2012-05-27 MED ORDER — OXYCODONE-ACETAMINOPHEN 5-325 MG PO TABS
2.0000 | ORAL_TABLET | Freq: Once | ORAL | Status: DC
  Administered 2012-05-27: 2 via ORAL

## 2012-05-27 MED ORDER — HYDROMORPHONE HCL PF 1 MG/ML IJ SOLN: 1.0000 mg | Freq: Once | INTRAMUSCULAR | Status: DC

## 2012-05-27 MED ORDER — GELATIN ABSORBABLE 12-7 MM EX MISC
CUTANEOUS | Status: AC
  Filled 2012-05-27: qty 1

## 2012-05-27 NOTE — Procedures (Signed)
S/P L5 VP 

## 2012-05-27 NOTE — H&P (Signed)
Victor [name] is an 52 y.o. male.   Chief Complaint: Lumbar #5 vertebroplasty performed 03/19/2012 Pt has has continued pain Repeat MRI shows NO new fractures but does reveal continued edema at L5 Scheduled now for additional methylmethacrylate at L5 level HPI: Hep B; thrombocytopenia; DM; HTN; HLD; renal stones; splenomegaly  Past Medical History  Diagnosis Date  . Hepatitis B virus infection   . Thrombocytopenia   . Sciatica   . Diabetes mellitus   . Hyperlipidemia   . Hypertension   . GERD (gastroesophageal reflux disease)   . Osteoarthritis   . Anxiety   . Depression   . Recurrent upper respiratory infection (URI)     head cold last week- no fever- states resolved  . Blood transfusion     platelets  . Chronic kidney disease     kidney stones 2012  . Hepatitis     hepatitis b/ newly diagnosed with portal hypertension  . Splenomegaly     LOV Dr Gaylyn Rong  12/12 on chart    Past Surgical History  Procedure Laterality Date  . Hand surgery      right hand  . Shoulder surgery      left shoulder  . Knee surgery      5 surgeries to right knee, 2 surgeries to left knee  . Lumbar laminectomy/decompression microdiscectomy  02/19/2011    Procedure: LUMBAR LAMINECTOMY/DECOMPRESSION MICRODISCECTOMY;  Surgeon: Javier Docker;  Location: [facility];  Service: Orthopedics;  Laterality: Left;  decompression l5-s1 l4-5 on left    No family history on file. Social History:  reports that he quit smoking about 26 years ago. He does not have any smokeless tobacco history on file. He reports that he does not drink alcohol or use illicit drugs.  Allergies:  Allergies  Allergen Reactions  . Demerol (Meperidine Hcl) Other (See Comments)    REACTION: not compatible with Emsam patch.  . Flexeril (Cyclobenzaprine Hcl) Other (See Comments)    REACTION:  Not compatible with Emsam patch.  . Vicodin (Hydrocodone-Acetaminophen) Other (See Comments)    REACTION: pt turns red and has hot flashes     (Not  in a hospital admission)  Results for orders placed during the hospital encounter of 05/27/12 (from the past 48 hour(s))  GLUCOSE, CAPILLARY     Status: Abnormal   Collection Time    05/27/12  8:54 AM      Result Value Range   Glucose-Capillary 110 (*) 70 - 99 mg/dL   Comment 1 Documented in Chart     Comment 2 Notify RN     No results found.  Review of Systems  Constitutional: Negative for fever and weight loss.  Respiratory: Negative for shortness of breath.   Cardiovascular: Negative for chest pain.  Gastrointestinal: Negative for nausea, vomiting and abdominal pain.  Musculoskeletal: Positive for back pain.  Neurological: Positive for weakness and headaches.    Blood pressure 126/69, pulse 64, temperature 97.5 F (36.4 C), temperature source Oral, resp. rate 20, height 6' (1.829 m), weight 280 lb (127.007 kg), SpO2 96.00%. Physical Exam  Constitutional: He is oriented to person, place, and time. He appears well-developed.  Cardiovascular: Normal rate, regular rhythm and normal heart sounds.   No murmur heard. Respiratory: Effort normal and breath sounds normal. He has no wheezes.  GI: Soft. Bowel sounds are normal. He exhibits distension. There is no tenderness.  Musculoskeletal: Normal range of motion.  Neurological: He is alert and oriented to person, place, and time. Coordination  normal.  Psychiatric: He has a normal mood and affect. His behavior is normal. Judgment and thought content normal.     Assessment/Plan Previous L5 VP 03/19/2012 Continued back pain Recent MRI does show edema at L5 area. Scheduled now for additional cement injection Will check plt level; may need plt transfusion Pt aware of procedure benefits and risks and agreeable to proceed consent signed and in chart  Tracer Gutridge A 05/27/2012, 8:59 AM

## 2012-05-29 ENCOUNTER — Other Ambulatory Visit (HOSPITAL_COMMUNITY): Payer: Self-pay | Admitting: Interventional Radiology

## 2012-05-29 DIAGNOSIS — IMO0002 Reserved for concepts with insufficient information to code with codable children: Secondary | ICD-10-CM

## 2012-06-10 ENCOUNTER — Ambulatory Visit (HOSPITAL_COMMUNITY)
Admission: RE | Admit: 2012-06-10 | Discharge: 2012-06-10 | Disposition: A | Discharge status: Home / Self Care | Payer: BC Managed Care – PPO | Source: Ambulatory Visit | Attending: Interventional Radiology | Admitting: Interventional Radiology

## 2012-06-10 DIAGNOSIS — IMO0002 Reserved for concepts with insufficient information to code with codable children: Secondary | ICD-10-CM

## 2012-09-10 ENCOUNTER — Ambulatory Visit (INDEPENDENT_AMBULATORY_CARE_PROVIDER_SITE_OTHER): Payer: BC Managed Care – PPO

## 2012-09-10 ENCOUNTER — Other Ambulatory Visit: Payer: Self-pay | Admitting: Sports Medicine

## 2012-09-10 DIAGNOSIS — M79671 Pain in right foot: Secondary | ICD-10-CM

## 2012-09-10 DIAGNOSIS — M79609 Pain in unspecified limb: Secondary | ICD-10-CM

## 2012-10-29 ENCOUNTER — Other Ambulatory Visit: Payer: Self-pay | Admitting: Gastroenterology

## 2012-10-29 DIAGNOSIS — K7689 Other specified diseases of liver: Secondary | ICD-10-CM

## 2012-11-06 ENCOUNTER — Ambulatory Visit
Admission: RE | Admit: 2012-11-06 | Discharge: 2012-11-06 | Disposition: A | Discharge status: Home / Self Care | Payer: BC Managed Care – PPO | Source: Ambulatory Visit | Attending: Gastroenterology | Admitting: Gastroenterology

## 2012-11-06 DIAGNOSIS — K7689 Other specified diseases of liver: Secondary | ICD-10-CM

## 2012-11-11 ENCOUNTER — Other Ambulatory Visit: Payer: Self-pay | Admitting: Gastroenterology

## 2012-11-20 IMAGING — CR DG LUMBAR SPINE 2-3V
3 series · 3 of 3 positions shown · non-contrast
Comparison: MRI 08/09/2010.

CLINICAL DATA: History of back pain.  History of pain radiating to
the left leg.  History of stenosis.

LUMBAR SPINE - 2-3 VIEW

[t l-spine a.p. *]
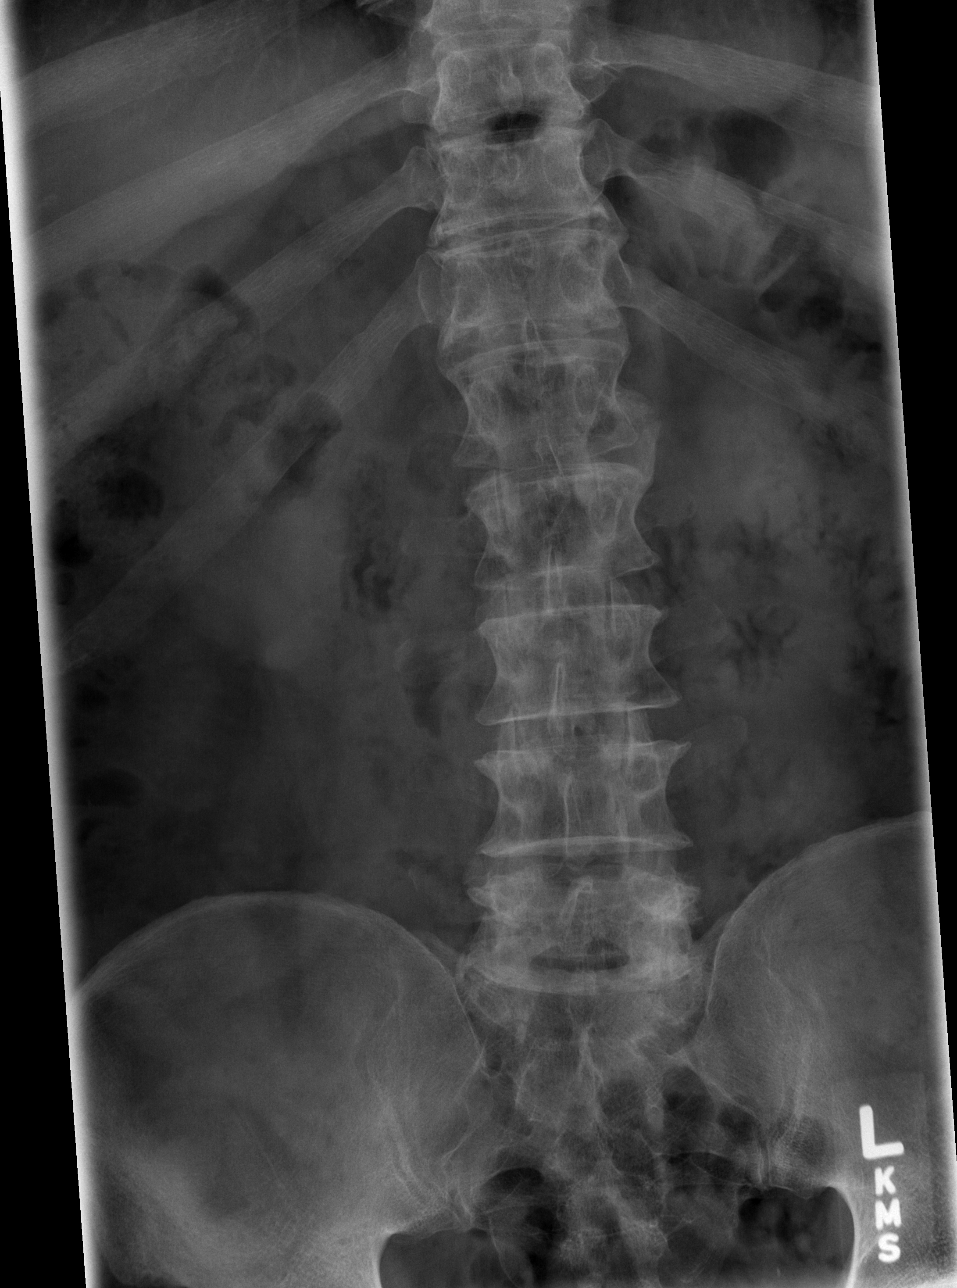

[t l-spine lat]
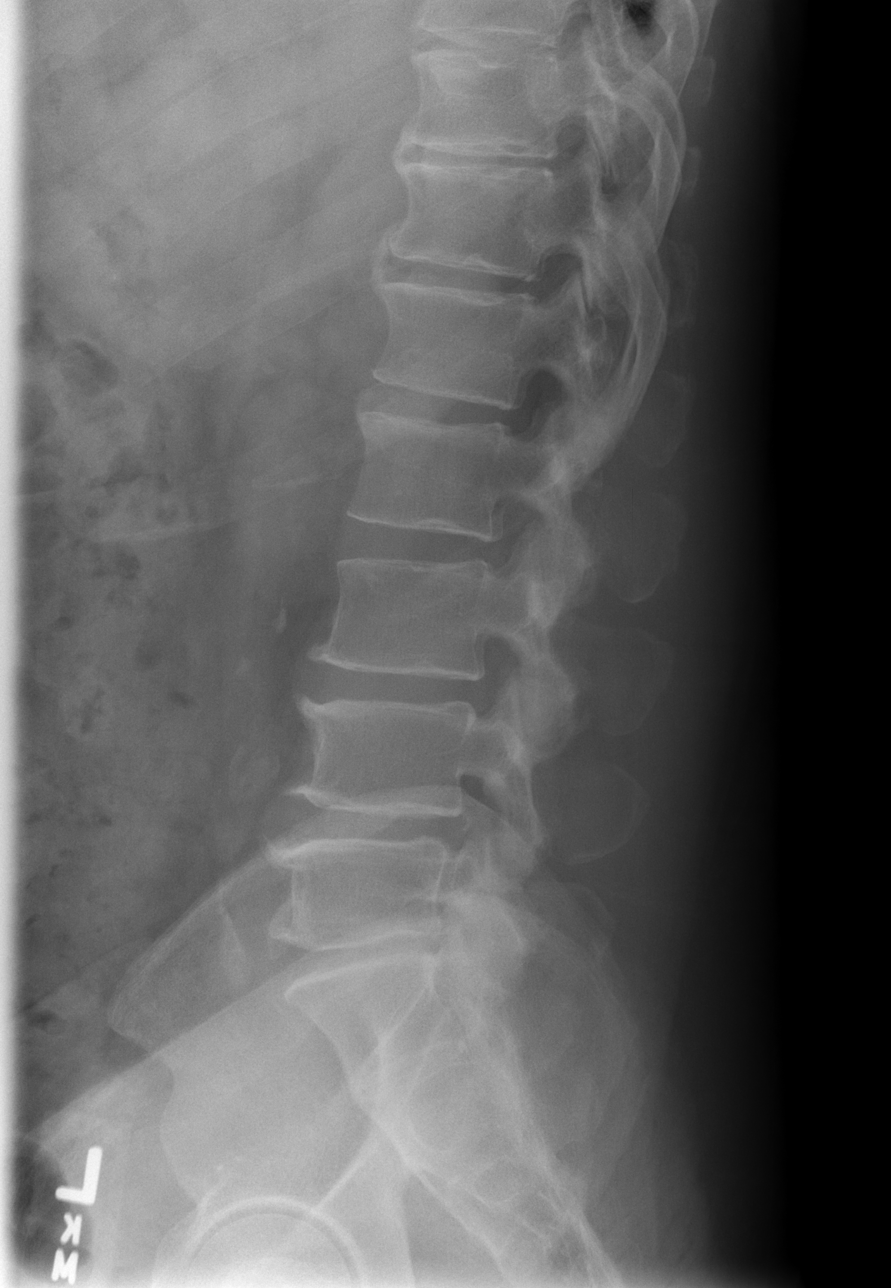

[t l-spine l5-s1 spot *]
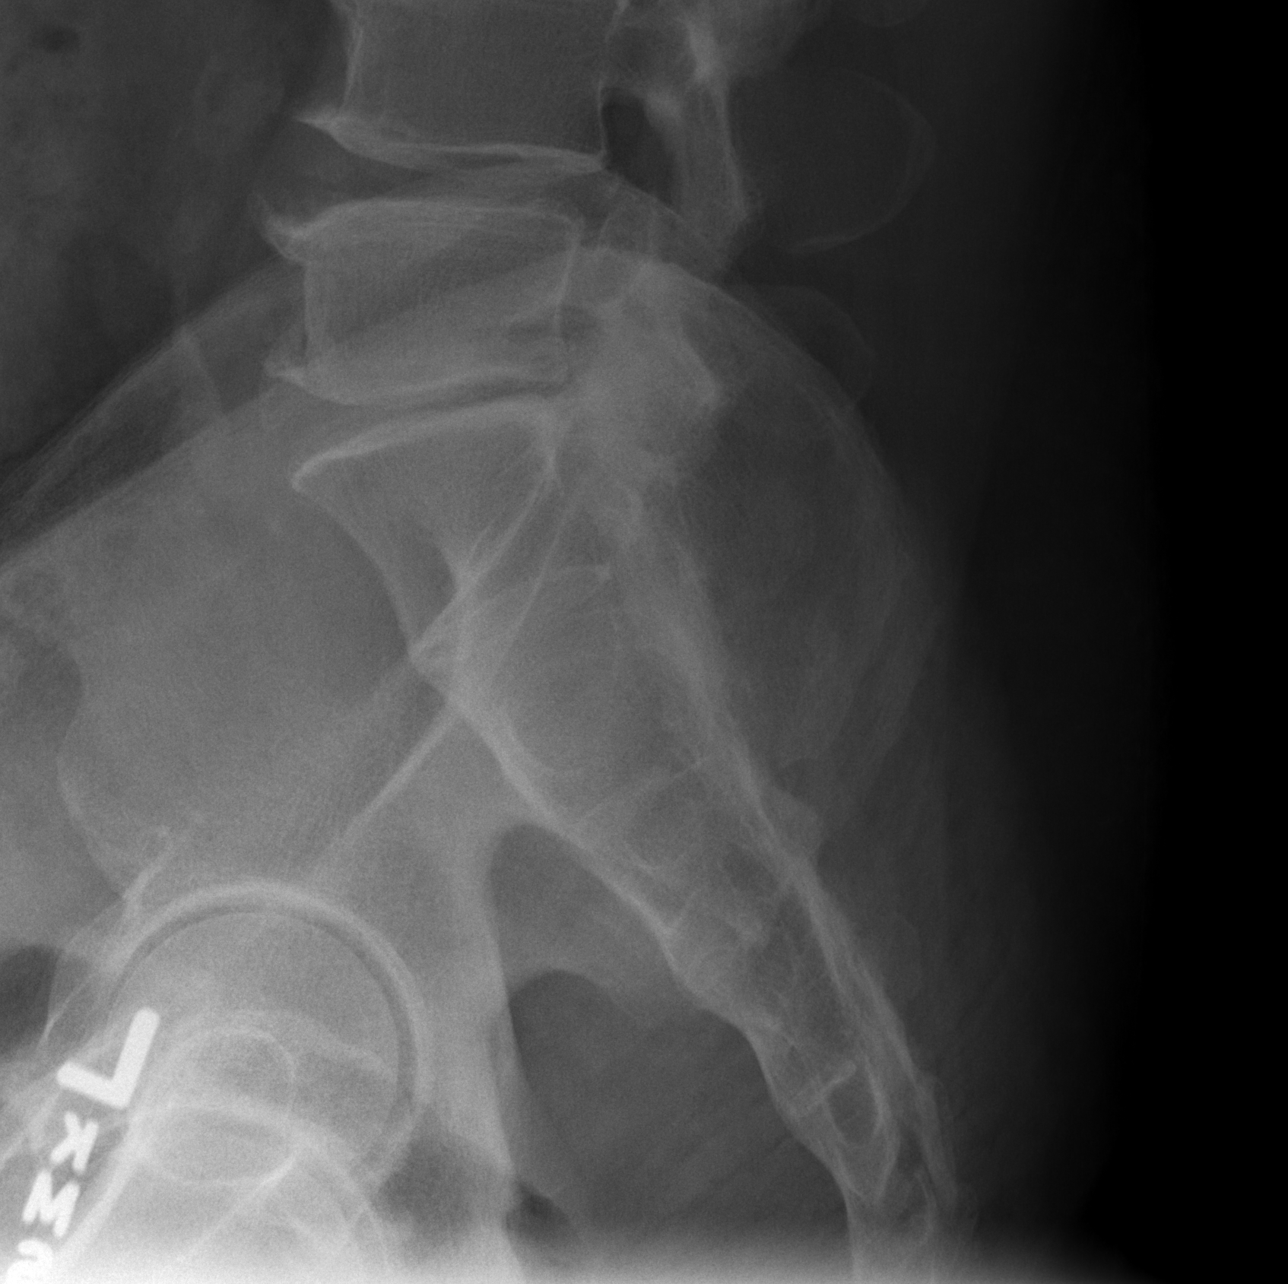

[3 of 3 positions shown; findings below may reference images not displayed]

FINDINGS: There are five non-rib bearing lumbar-type vertebral
bodies labeled L1-L5.  There is marginal osteophyte formation at
multiple levels representing degenerative spondylosis.  There is
narrowing of intervertebral disc space at L5-S1 level.  There is
slight posterior slippage of the body of L4 on the body of L5 and
the body of L5 on the body of S1.  There is narrowing of the AP
diameter of the neural canal at the L5-S1 level.  No fracture or
bony destruction is seen.  Minimal nonaneurysmal aortic
calcification is present.  SI joints appear intact. No pars defects
were evident.
IMPRESSION: Osteophyte formation consistent with degenerative spondylosis.
Narrowing of intervertebral disc space at L5-S1 level. Narrowing of
the AP diameter of neural canal at L5-S1 level.  Slight posterior
slippage of the body of L4 on the body of L5 and the body of L5 on
the body of S1. Minimal nonaneurysmal aortic calcification.

## 2013-02-04 ENCOUNTER — Encounter: Payer: Self-pay | Admitting: General Surgery

## 2013-02-04 DIAGNOSIS — G4733 Obstructive sleep apnea (adult) (pediatric): Secondary | ICD-10-CM | POA: Insufficient documentation

## 2013-02-04 IMAGING — CR DG LUMBAR SPINE 2-3V
3 series · 3 of 3 positions shown · non-contrast
Comparison: Multiple exams, including 12/05/2010 and 08/09/2010

CLINICAL DATA: Preoperative for back surgery.  Pain and tingling
and numbness, extending down the left leg.

LUMBAR SPINE - 2-3 VIEW

[t l-spine a.p.]
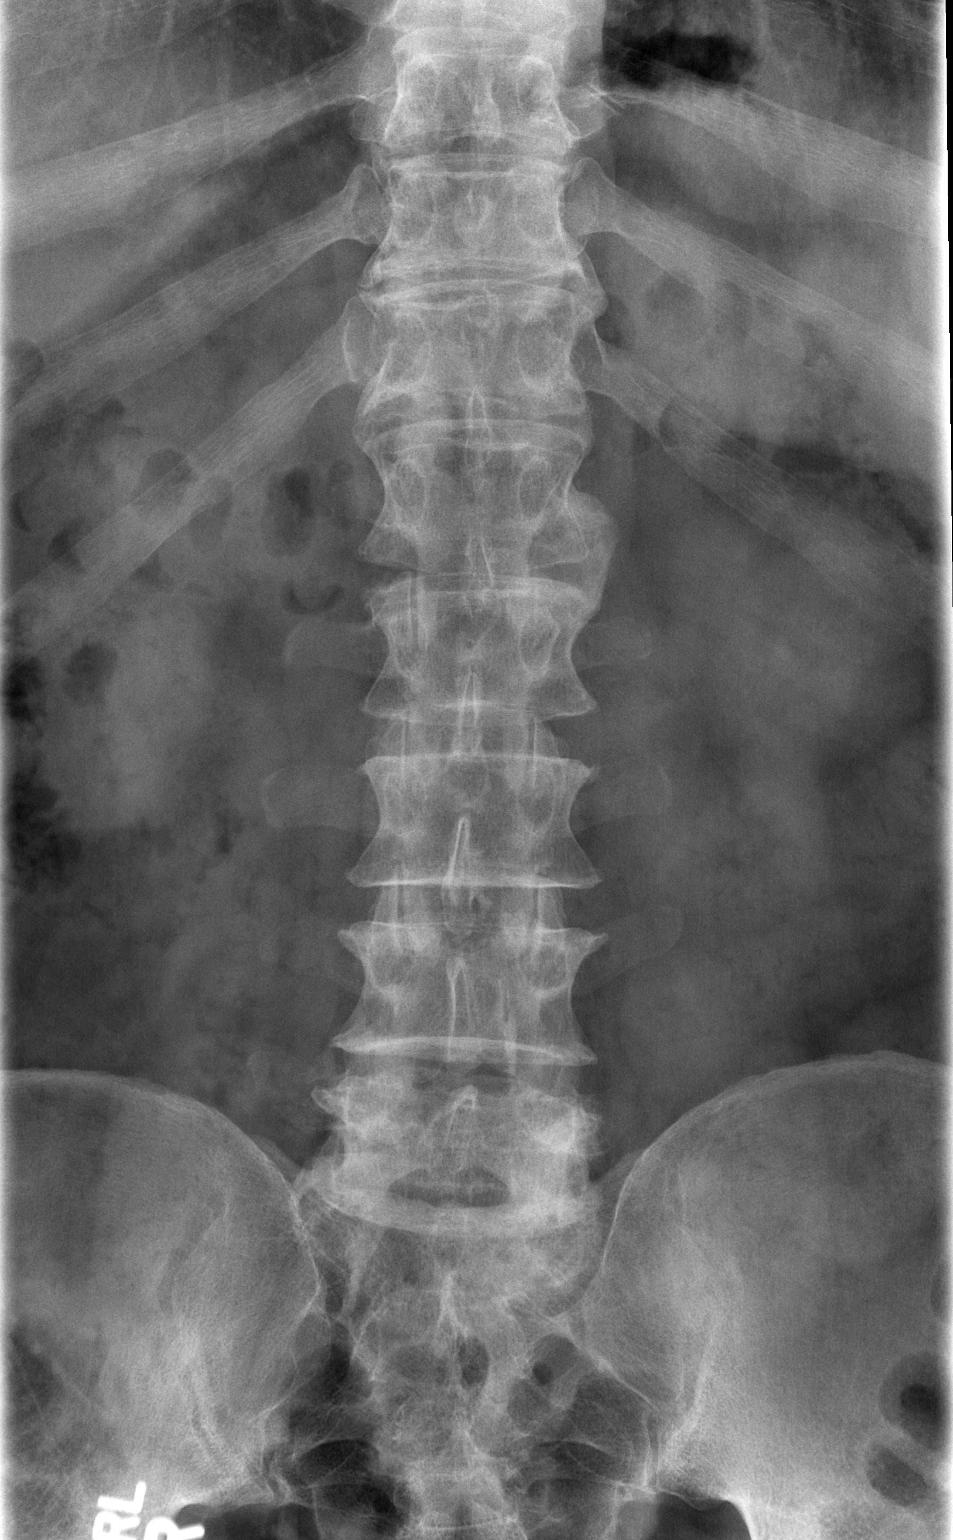

[t l-spine lat]
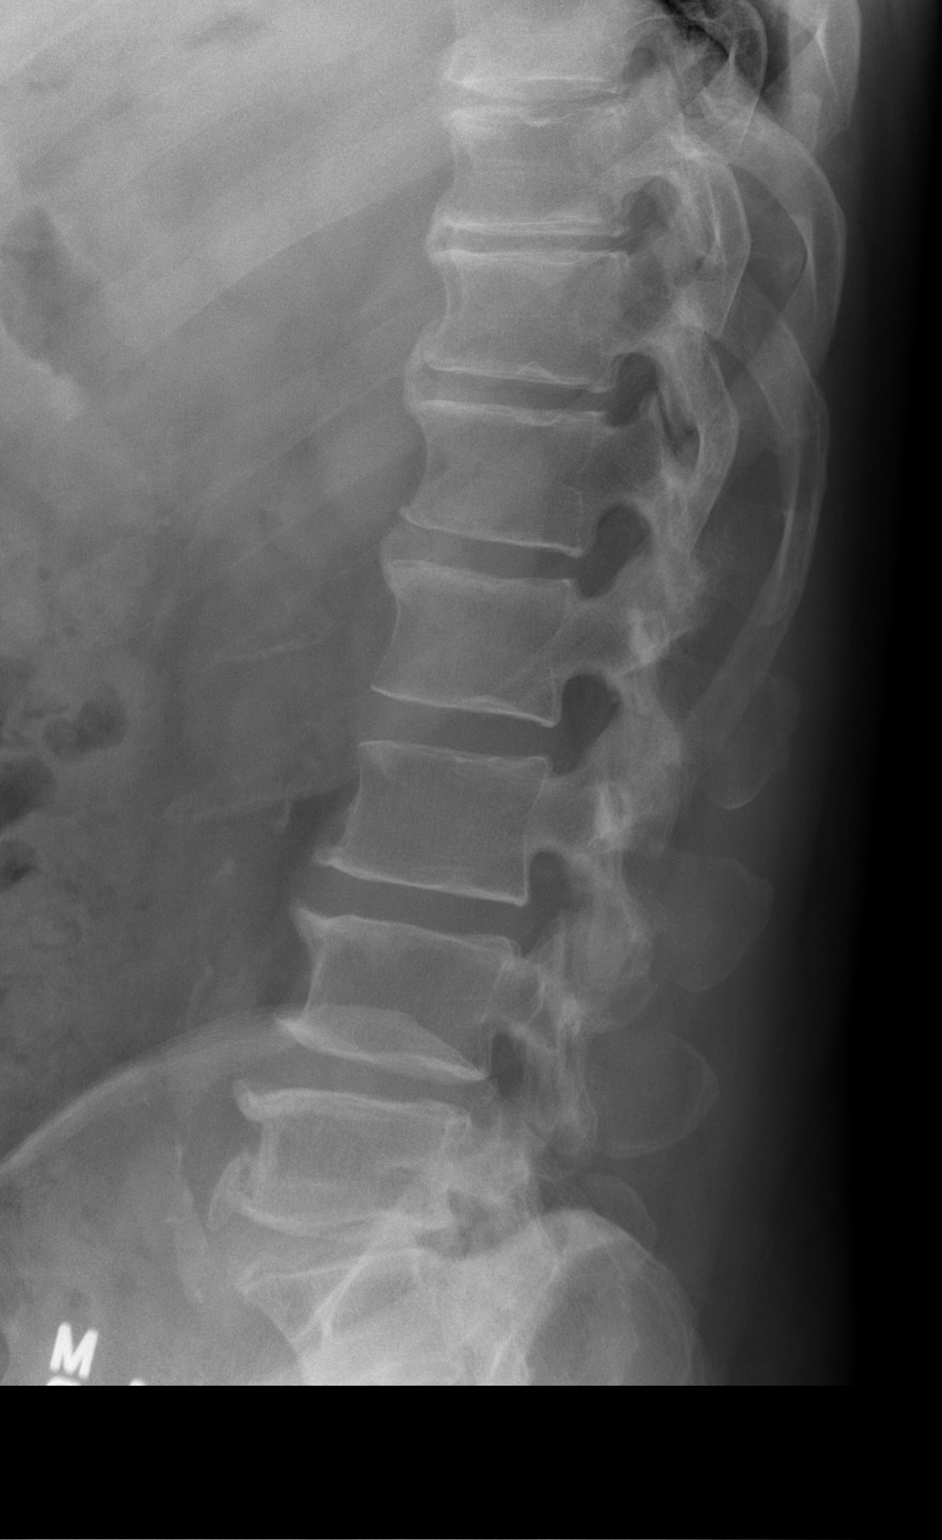

[t l-spine l5-s1 spot]
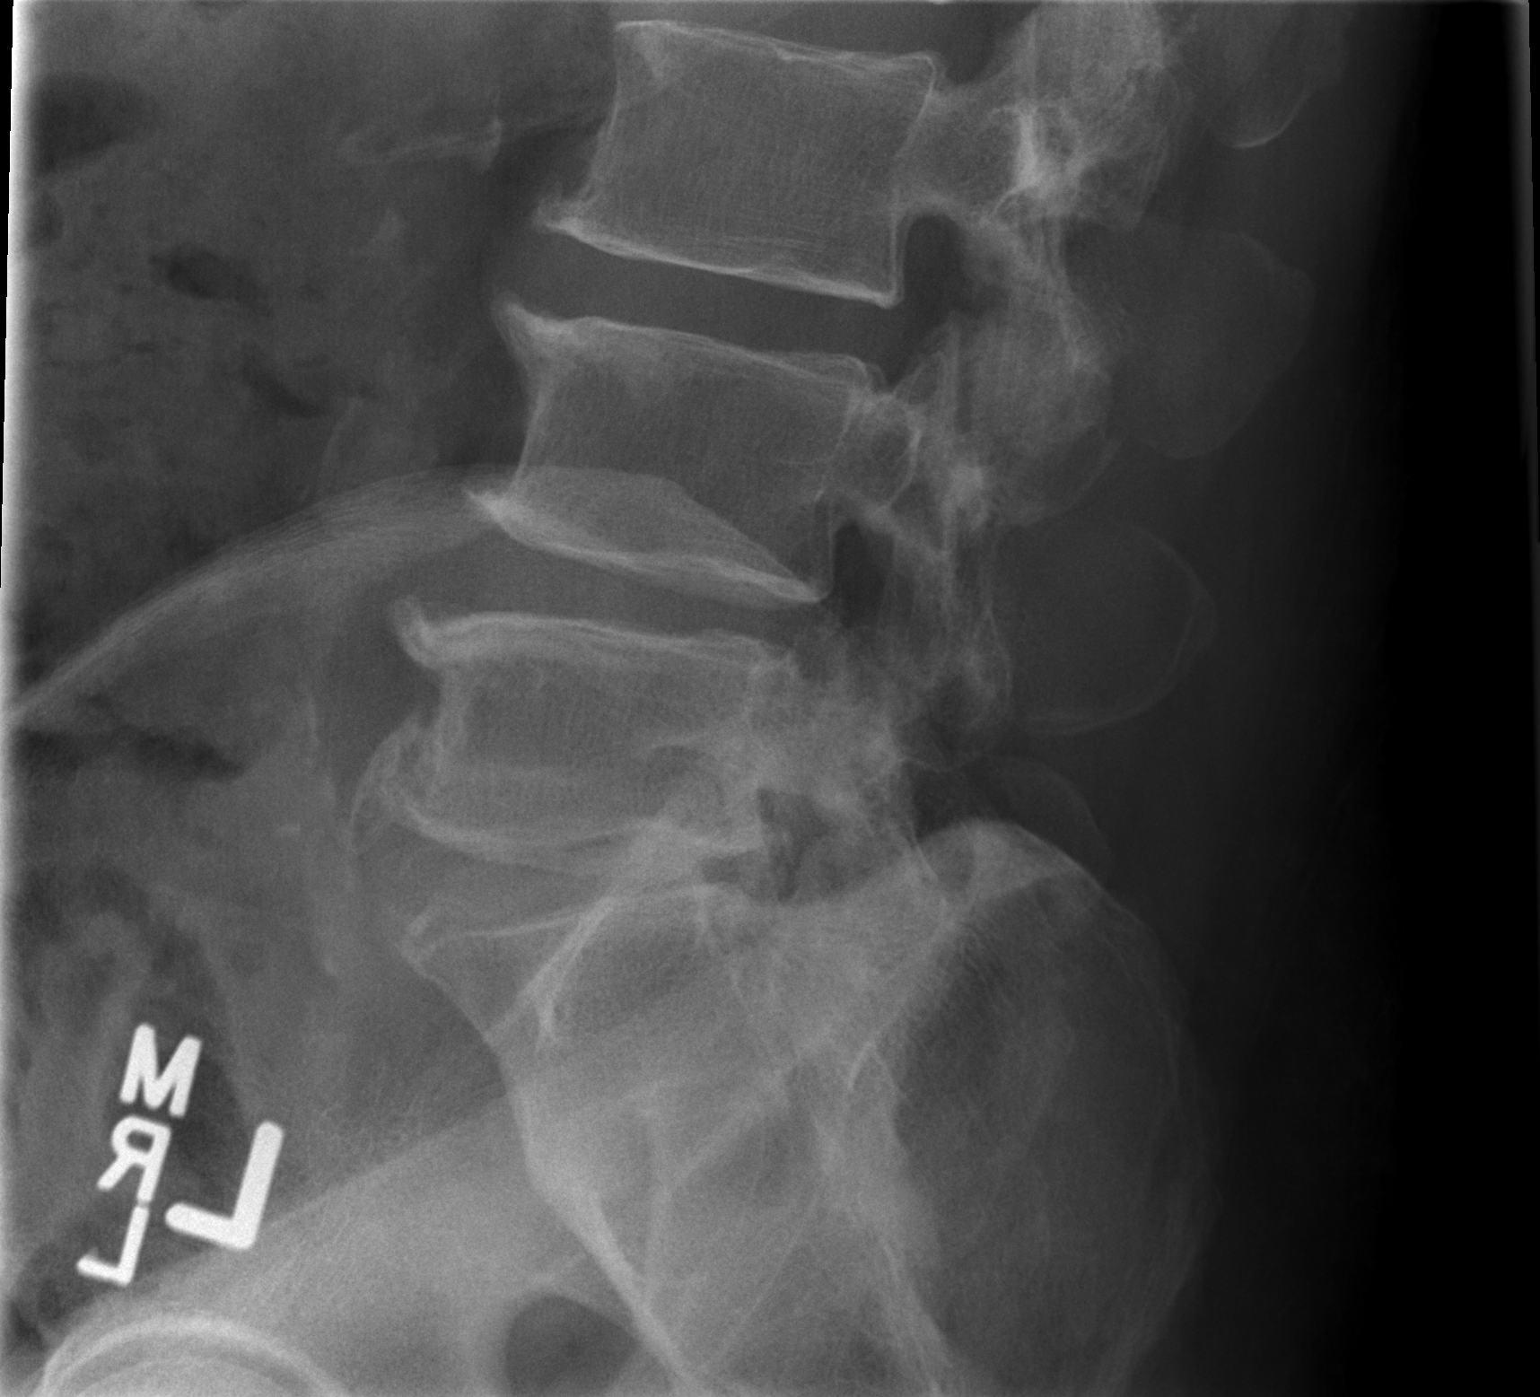

[3 of 3 positions shown; findings below may reference images not displayed]

FINDINGS: Stable multilevel intervertebral spurring noted, with
unchanged 3 mm of posterior subluxation of L4 on L5, and mild loss
of intervertebral disc height at L5-S1.  A mildly transitional s
one vertebra is observed.  The overall appearance is similar to the
prior exam from 12/05/2010.
IMPRESSION: 1.  Lumbar spondylosis, with mild subluxation at L4-5.

## 2013-02-18 ENCOUNTER — Ambulatory Visit: Payer: BC Managed Care – PPO | Admitting: Cardiology

## 2013-03-25 ENCOUNTER — Ambulatory Visit: Payer: BC Managed Care – PPO | Admitting: Cardiology

## 2013-03-25 ENCOUNTER — Encounter: Payer: Self-pay | Admitting: Cardiology

## 2013-03-25 ENCOUNTER — Ambulatory Visit (INDEPENDENT_AMBULATORY_CARE_PROVIDER_SITE_OTHER): Payer: BC Managed Care – PPO | Admitting: Cardiology

## 2013-03-25 VITALS — BP 141/82 | HR 72

## 2013-03-25 DIAGNOSIS — G4733 Obstructive sleep apnea (adult) (pediatric): Secondary | ICD-10-CM

## 2013-03-25 DIAGNOSIS — I10 Essential (primary) hypertension: Secondary | ICD-10-CM | POA: Insufficient documentation

## 2013-03-25 DIAGNOSIS — E669 Obesity, unspecified: Secondary | ICD-10-CM

## 2013-03-25 NOTE — Patient Instructions (Signed)
Your physician recommends that you continue on your current medications as directed. Please refer to the Current Medication list given to you today.  Your physician wants you to follow-up in: 6 months with Dr Turner You will receive a reminder letter in the mail two months in advance. If you don't receive a letter, please call our office to schedule the follow-up appointment.  

## 2013-03-25 NOTE — Progress Notes (Signed)
[place], [address], [place]  [address] Phone: [phone] Fax:  [phone]  Date:  03/25/2013   ID:  SUKHDEEP WIETING, DOB 10-19-1960, MRN [id number]  PCP:  [name], MD  Sleep Medicine:  Fransico Him, MD    History of Present Illness: Victor [name] is a 53 y.o. male with a history of OSA, obesity and HTN who presents today for followup.  He is doing well today.  He tolerates his CPAP therapy well.  He uses a nasal mask with no chin strap and tolerates the mask.  He feels for the most part that the pressure is ok.  He feels rested in the am and has no daytime sleepiness during the day.   Wt Readings from Last 3 Encounters:  05/27/12 280 lb (127.007 kg)  03/13/12 280 lb (127.007 kg)  03/11/12 280 lb (127.007 kg)     Past Medical History  Diagnosis Date  . Hepatitis B virus infection 03/2007  . Thrombocytopenia   . Sciatica   . Diabetes mellitus     Type II  . Hyperlipidemia   . Hypertension   . GERD (gastroesophageal reflux disease)   . Osteoarthritis   . Anxiety   . Depression   . Recurrent upper respiratory infection (URI)     head cold last week- no fever- states resolved  . Blood transfusion     platelets  . Chronic kidney disease     kidney stones 2012  . Hepatitis     hepatitis b/ newly diagnosed with portal hypertension  . Splenomegaly     LOV Dr Lamonte Sakai  12/12 on chart  . Obstructive sleep apnea (adult) (pediatric)     mild with AHI 11.61/hr now on CPAP at 15cm h2o    Current Outpatient Prescriptions  Medication Sig Dispense Refill  . alprazolam (XANAX) 2 MG tablet Take 2 mg by mouth 3 (three) times daily as needed for anxiety.       Marland Kitchen atenolol (TENORMIN) 25 MG tablet Take 25 mg by mouth every morning.      Marland Kitchen atorvastatin (LIPITOR) 40 MG tablet Take 40 mg by mouth daily.      . clobetasol cream (TEMOVATE) 4.03 % Apply 1 application topically daily as needed (for inflammation).       Marland Kitchen glipiZIDE (GLUCOTROL XL) 2.5 MG 24 hr tablet Take 2.5 mg  by mouth daily.      Marland Kitchen lisinopril (PRINIVIL,ZESTRIL) 2.5 MG tablet Take 2.5 mg by mouth daily.      . metFORMIN (GLUCOPHAGE) 1000 MG tablet Take 1,000 mg by mouth 2 (two) times daily with a meal.       . omega-3 acid ethyl esters (LOVAZA) 1 G capsule Take 1 g by mouth daily.      . pantoprazole (PROTONIX) 40 MG tablet Take 40 mg by mouth daily.      . selegiline (EMSAM) 6 MG/24HR Place 1 patch onto the skin every evening.       . sitaGLIPtin (JANUVIA) 100 MG tablet Take 100 mg by mouth daily.       . tamsulosin (FLOMAX) 0.4 MG CAPS Take 0.4 mg by mouth daily.      . vitamin E 400 UNIT capsule Take 400 Units by mouth daily.       No current facility-administered medications for this visit.    Allergies:    Allergies  Allergen Reactions  . Demerol [Meperidine Hcl] Other (See Comments)    REACTION: not compatible  with Emsam patch.  Yvette Rack [Cyclobenzaprine Hcl] Other (See Comments)    REACTION:  Not compatible with Emsam patch.  . Nsaids     Stomach Upset  . Vicodin [Hydrocodone-Acetaminophen] Other (See Comments)    REACTION: pt turns red and has hot flashes    Social History:  The patient  reports that he quit smoking about 27 years ago. He does not have any smokeless tobacco history on file. He reports that he does not drink alcohol or use illicit drugs.   Family History:  The patient's family history is not on file.   ROS:  Please see the history of present illness.      All other systems reviewed and negative.   PHYSICAL EXAM: VS:  There were no vitals taken for this visit. Well nourished, well developed, in no acute distress HEENT: normal Neck: no JVD Cardiac:  normal S1, S2; RRR; no murmur Lungs:  clear to auscultation bilaterally, no wheezing, rhonchi or rales Abd: soft, nontender, no hepatomegaly Ext: no edema Skin: warm and dry Neuro:  CNs 2-12 intact, no focal abnormalities noted       ASSESSMENT AND PLAN:  1. OSA on CPAP therapy and tolerating well - his  download today showed an AHI of 0.1/hr on 15cm H2O and 90% compliance in using more than 4 hours nightly  - continue current CPAP settings 2. HTN 3. Obesity  Followup with me in 6 months  Signed, Fransico Him, MD 03/25/2013 8:44 AM

## 2013-04-13 ENCOUNTER — Encounter: Payer: Self-pay | Admitting: Cardiology

## 2013-06-09 ENCOUNTER — Telehealth: Payer: Self-pay | Admitting: General Surgery

## 2013-06-09 ENCOUNTER — Other Ambulatory Visit: Payer: Self-pay | Admitting: General Surgery

## 2013-06-09 DIAGNOSIS — G4733 Obstructive sleep apnea (adult) (pediatric): Secondary | ICD-10-CM

## 2013-06-09 NOTE — Telephone Encounter (Signed)
Order put in EPIC> Will make [facility] aware.

## 2013-06-09 NOTE — Telephone Encounter (Signed)
Victor [name] saw Victor [name] this February for his Sleep f/u. [facility] sent Victor [name] a reuest for a Current RX for ALL CPAP Supplies including Mask,headgear,filters,tubing,cushions, and water chamber.  OK to send in order for pt?  Fax order to [phone] Phone [phone] ext [phone]

## 2013-06-09 NOTE — Telephone Encounter (Signed)
yes

## 2013-10-12 ENCOUNTER — Encounter: Payer: Self-pay | Admitting: Cardiology

## 2013-10-13 ENCOUNTER — Encounter: Payer: Self-pay | Admitting: Cardiology

## 2013-10-13 ENCOUNTER — Ambulatory Visit (INDEPENDENT_AMBULATORY_CARE_PROVIDER_SITE_OTHER): Payer: BC Managed Care – PPO | Admitting: Cardiology

## 2013-10-13 VITALS — BP 136/78 | HR 63 | Ht 72.0 in | Wt 282.2 lb

## 2013-10-13 DIAGNOSIS — I10 Essential (primary) hypertension: Secondary | ICD-10-CM

## 2013-10-13 DIAGNOSIS — E669 Obesity, unspecified: Secondary | ICD-10-CM

## 2013-10-13 DIAGNOSIS — G4733 Obstructive sleep apnea (adult) (pediatric): Secondary | ICD-10-CM

## 2013-10-13 NOTE — Progress Notes (Signed)
Victor [name], Victor [name],   [address] Phone: [phone] Fax:  [phone]  Date:  10/13/2013   ID:  Victor [name], DOB 1960-04-19, MRN [id number]  PCP:  [name], MD  Cardiologist:  Fransico Him, MD     History of Present Illness: Victor [name] is a 53 y.o. male with a history of OSA, obesity and HTN who presents today for followup. He is doing well today. He tolerates his CPAP therapy well. He uses a nasal mask with no chin strap and tolerates the mask. He feels for the most part that the pressure is ok. He feels rested in the am if he has slept well.  He only gets on average 4 hours nightly due to chronic back pain.  He usually naps for about an hour around lunch daily.  He occasionally will have some nasal congestion.      Wt Readings from Last 3 Encounters:  10/13/13 282 lb 3.2 oz (128.005 kg)  05/27/12 280 lb (127.007 kg)  03/13/12 280 lb (127.007 kg)     Past Medical History  Diagnosis Date  . Hepatitis B virus infection 03/2007  . Thrombocytopenia   . Sciatica   . Diabetes mellitus     Type II  . Hyperlipidemia   . GERD (gastroesophageal reflux disease)   . Osteoarthritis   . Anxiety   . Depression   . Recurrent upper respiratory infection (URI)     head cold last week- no fever- states resolved  . Blood transfusion     platelets  . Chronic kidney disease     kidney stones 2012  . Hepatitis     hepatitis b/ newly diagnosed with portal hypertension  . Splenomegaly     LOV Dr Lamonte Sakai  12/12 on chart  . Obstructive sleep apnea (adult) (pediatric)     mild with AHI 11.61/hr now on CPAP at 15cm h2o  . Hypertension     Current Outpatient Prescriptions  Medication Sig Dispense Refill  . alprazolam (XANAX) 2 MG tablet Take 2 mg by mouth 3 (three) times daily as needed for anxiety.       Marland Kitchen atenolol (TENORMIN) 25 MG tablet Take 25 mg by mouth every morning.      Marland Kitchen atorvastatin (LIPITOR) 40 MG tablet Take 40 mg by mouth daily.      Marland Kitchen glipiZIDE  (GLUCOTROL XL) 2.5 MG 24 hr tablet Take 2.5 mg by mouth daily.      Marland Kitchen lisinopril (PRINIVIL,ZESTRIL) 2.5 MG tablet Take 2.5 mg by mouth daily.      . metFORMIN (GLUCOPHAGE) 1000 MG tablet Take 1,000 mg by mouth 2 (two) times daily with a meal.       . omega-3 acid ethyl esters (LOVAZA) 1 G capsule Take 1 g by mouth daily.      . pantoprazole (PROTONIX) 40 MG tablet Take 40 mg by mouth daily.      . selegiline (EMSAM) 6 MG/24HR Place 1 patch onto the skin every evening.       . sitaGLIPtin (JANUVIA) 100 MG tablet Take 100 mg by mouth daily.       . vitamin E 400 UNIT capsule Take 400 Units by mouth daily.       No current facility-administered medications for this visit.    Allergies:    Allergies  Allergen Reactions  . Demerol [Meperidine Hcl] Other (See Comments)    REACTION: not compatible with Emsam patch.  Yvette Rack [Cyclobenzaprine Hcl]  Other (See Comments)    REACTION:  Not compatible with Emsam patch.  . Nsaids     Stomach Upset  . Vicodin [Hydrocodone-Acetaminophen] Other (See Comments)    REACTION: pt turns red and has hot flashes    Social History:  The patient  reports that he quit smoking about 27 years ago. He does not have any smokeless tobacco history on file. He reports that he does not drink alcohol or use illicit drugs.   Family History:  The patient's family history is not on file.   ROS:  Please see the history of present illness.      All other systems reviewed and negative.   PHYSICAL EXAM: VS:  BP 136/78  Pulse 63  Ht 6' (1.829 m)  Wt 282 lb 3.2 oz (128.005 kg)  BMI 38.26 kg/m2 Well nourished, well developed, in no acute distress HEENT: normal Neck: no JVD Cardiac:  normal S1, S2; RRR; no murmur Lungs:  clear to auscultation bilaterally, no wheezing, rhonchi or rales Abd: soft, nontender, no hepatomegaly Ext: no edema Skin: warm and dry Neuro:  CNs 2-12 intact, no focal abnormalities noted  ASSESSMENT AND PLAN:  1. OSA on CPAP therapy and  tolerating well - his download today showed an AHI of 0.1/hr on 15cm H2O and 95% compliance in using more than 4 hours nightly.  He will continue on current CPAP settings 2. HTN - well controlled - continue Tenormin/Lisinopril 3. Obesity - His exercise is limited due to chronic back and knee pain but he is going to start water aerobics to try to lose some weight so he can get a knee replacement  Followup with me in 6 months       Signed, Fransico Him, MD 10/13/2013 3:56 PM

## 2013-10-13 NOTE — Patient Instructions (Signed)
Your physician recommends that you continue on your current medications as directed. Please refer to the Current Medication list given to you today.  Your physician wants you to follow-up in: 6 months with Dr Turner You will receive a reminder letter in the mail two months in advance. If you don't receive a letter, please call our office to schedule the follow-up appointment.  

## 2013-10-20 ENCOUNTER — Encounter: Payer: Self-pay | Admitting: General Surgery

## 2013-10-30 ENCOUNTER — Encounter: Payer: Self-pay | Admitting: *Deleted

## 2014-02-02 ENCOUNTER — Encounter: Payer: Self-pay | Admitting: Cardiology

## 2014-04-13 NOTE — Progress Notes (Signed)
This encounter was created in error - please disregard.

## 2014-04-14 ENCOUNTER — Encounter: Payer: BC Managed Care – PPO | Admitting: Cardiology

## 2014-05-19 ENCOUNTER — Encounter: Payer: Self-pay | Admitting: Cardiology

## 2014-05-19 ENCOUNTER — Ambulatory Visit (INDEPENDENT_AMBULATORY_CARE_PROVIDER_SITE_OTHER): Payer: BLUE CROSS/BLUE SHIELD | Admitting: Cardiology

## 2014-05-19 VITALS — BP 126/72 | HR 78 | Ht 72.0 in | Wt 278.1 lb

## 2014-05-19 DIAGNOSIS — E785 Hyperlipidemia, unspecified: Secondary | ICD-10-CM | POA: Diagnosis not present

## 2014-05-19 DIAGNOSIS — G4733 Obstructive sleep apnea (adult) (pediatric): Secondary | ICD-10-CM | POA: Diagnosis not present

## 2014-05-19 DIAGNOSIS — I10 Essential (primary) hypertension: Secondary | ICD-10-CM | POA: Diagnosis not present

## 2014-05-19 DIAGNOSIS — E669 Obesity, unspecified: Secondary | ICD-10-CM | POA: Diagnosis not present

## 2014-05-19 NOTE — Assessment & Plan Note (Signed)
Discussed weight loss. He is limited by arthralgias.

## 2014-05-19 NOTE — Assessment & Plan Note (Signed)
We will arrange follow-up with Dr. Radford Pax in 6 months.

## 2014-05-19 NOTE — Assessment & Plan Note (Addendum)
Continue statin. Lipids and liver monitored by primary care. 

## 2014-05-19 NOTE — Assessment & Plan Note (Signed)
Blood pressure controlled. Continue present medications. Potassium and renal function monitored by primary care. 

## 2014-05-19 NOTE — Progress Notes (Signed)
HPI: follow-up hypertension. Followed by Dr. Radford Pax for this issue and obstructive sleep apnea. Abdominal ultrasound September 2014 showed no aneurysm. Patient denies dyspnea, chest pain, palpitations or syncope.  Current Outpatient Prescriptions  Medication Sig Dispense Refill  . alprazolam (XANAX) 2 MG tablet Take 2 mg by mouth 3 (three) times daily as needed for anxiety.     Marland Kitchen atenolol (TENORMIN) 25 MG tablet Take 25 mg by mouth every morning.    Marland Kitchen atorvastatin (LIPITOR) 40 MG tablet Take 40 mg by mouth daily.    Marland Kitchen lisinopril (PRINIVIL,ZESTRIL) 2.5 MG tablet Take 2.5 mg by mouth daily.    . metFORMIN (GLUCOPHAGE) 1000 MG tablet Take 1,000 mg by mouth 2 (two) times daily with a meal.     . omega-3 acid ethyl esters (LOVAZA) 1 G capsule Take 1 g by mouth daily.    . pantoprazole (PROTONIX) 40 MG tablet Take 40 mg by mouth daily.    . selegiline (EMSAM) 6 MG/24HR Place 1 patch onto the skin every evening.     . vitamin E 400 UNIT capsule Take 400 Units by mouth daily.    Nelva Nay SOLOSTAR 300 UNIT/ML SOPN Inject 70 Units into the skin daily.   0  . zaleplon (SONATA) 10 MG capsule   0   No current facility-administered medications for this visit.     Past Medical History  Diagnosis Date  . Hepatitis B virus infection 03/2007  . Thrombocytopenia   . Sciatica   . Diabetes mellitus     Type II  . Hyperlipidemia   . GERD (gastroesophageal reflux disease)   . Osteoarthritis   . Anxiety   . Depression   . Recurrent upper respiratory infection (URI)     head cold last week- no fever- states resolved  . Blood transfusion     platelets  . Chronic kidney disease     kidney stones 2012  . Hepatitis     hepatitis b/ newly diagnosed with portal hypertension  . Splenomegaly     LOV Dr Lamonte Sakai  12/12 on chart  . Obstructive sleep apnea (adult) (pediatric)     mild with AHI 11.61/hr now on CPAP at 15cm h2o  . Hypertension     Past Surgical History  Procedure Laterality Date  .  Hand surgery      right hand  . Shoulder surgery      left shoulder  . Knee surgery      5 surgeries to right knee, 2 surgeries to left knee  . Lumbar laminectomy/decompression microdiscectomy  02/19/2011    Procedure: LUMBAR LAMINECTOMY/DECOMPRESSION MICRODISCECTOMY;  Surgeon: Johnn Hai;  Location: [facility];  Service: Orthopedics;  Laterality: Left;  decompression l5-s1 l4-5 on left    History   Social History  . Marital Status: Married    Spouse Name: N/A  . Number of Children: N/A  . Years of Education: N/A   Occupational History  . Not on file.   Social History Main Topics  . Smoking status: Former Smoker -- 1.00 packs/day    Quit date: 12/31/1985  . Smokeless tobacco: Not on file  . Alcohol Use: No  . Drug Use: No  . Sexual Activity: Not on file   Other Topics Concern  . Not on file   Social History Narrative    ROS: chronic back and knee arthralgias but no fevers or chills, productive cough, hemoptysis, dysphasia, odynophagia, melena, hematochezia, dysuria, hematuria, rash, seizure activity, orthopnea, PND,  pedal edema, claudication. Remaining systems are negative.  Physical Exam: Well-developed obese in no acute distress.  Skin is warm and dry.  HEENT is normal.  Neck is supple.  Chest is clear to auscultation with normal expansion.  Cardiovascular exam is regular rate and rhythm.  Abdominal exam nontender or distended. No masses palpated. Extremities show no edema. neuro grossly intact  ECG probable ectopic atrial rhythm, normal axis, no ST changes.

## 2014-05-19 NOTE — Patient Instructions (Signed)
Your physician wants you to follow-up in: [place] will receive a reminder letter in the mail two months in advance. If you don't receive a letter, please call our office to schedule the follow-up appointment.

## 2014-09-15 ENCOUNTER — Encounter: Payer: Self-pay | Admitting: Cardiology

## 2014-11-29 ENCOUNTER — Encounter: Payer: Self-pay | Admitting: Cardiology

## 2014-11-29 ENCOUNTER — Ambulatory Visit (INDEPENDENT_AMBULATORY_CARE_PROVIDER_SITE_OTHER): Payer: BLUE CROSS/BLUE SHIELD | Admitting: Cardiology

## 2014-11-29 VITALS — BP 114/58 | HR 73 | Ht 72.0 in | Wt 293.0 lb

## 2014-11-29 DIAGNOSIS — E669 Obesity, unspecified: Secondary | ICD-10-CM

## 2014-11-29 DIAGNOSIS — G4733 Obstructive sleep apnea (adult) (pediatric): Secondary | ICD-10-CM

## 2014-11-29 DIAGNOSIS — I10 Essential (primary) hypertension: Secondary | ICD-10-CM

## 2014-11-29 NOTE — Patient Instructions (Signed)
Medication Instructions:  Your physician recommends that you continue on your current medications as directed. Please refer to the Current Medication list given to you today.  Labwork: None ordered  Testing/Procedures: None ordered  Follow-Up: Your physician wants you to follow-up in: 1 year with Dr. Radford Pax.  You will receive a reminder letter in the mail two months in advance. If you don't receive a letter, please call our office to schedule the follow-up appointment.  Any Other Special Instructions Will Be Listed Below (If Applicable). Thank you for choosing [facility]!!

## 2014-11-29 NOTE — Progress Notes (Signed)
Cardiology Office Note   Date:  11/29/2014   ID:  Victor [name], DOB 02/10/1961, MRN [id number]  PCP:  [name], MD    Chief Complaint  Patient presents with  . Sleep Apnea  . Hypertension      History of Present Illness: Victor [name] is a 54 y.o. male with a history of OSA, obesity and HTN who presents today for followup. He is doing well today. He tolerates his CPAP therapy well. He uses a nasal mask with no chin strap and tolerates the mask. He feels for the most part that the pressure is ok. He feels rested in the am if he has slept well. He only gets on average 4 hours nightly due to chronic back pain. He usually naps for about an hour around lunch daily. He occasionally will have some dry mouth.      Past Medical History  Diagnosis Date  . Hepatitis B virus infection 03/2007  . Thrombocytopenia ([place])   . Sciatica   . Diabetes mellitus     Type II  . Hyperlipidemia   . GERD (gastroesophageal reflux disease)   . Osteoarthritis   . Anxiety   . Depression   . Recurrent upper respiratory infection (URI)     head cold last week- no fever- states resolved  . Blood transfusion     platelets  . Chronic kidney disease     kidney stones 2012  . Hepatitis     hepatitis b/ newly diagnosed with portal hypertension  . Splenomegaly     LOV Dr Lamonte Sakai  12/12 on chart  . Obstructive sleep apnea (adult) (pediatric)     mild with AHI 11.61/hr now on CPAP at 15cm h2o  . Hypertension     Past Surgical History  Procedure Laterality Date  . Hand surgery      right hand  . Shoulder surgery      left shoulder  . Knee surgery      5 surgeries to right knee, 2 surgeries to left knee  . Lumbar laminectomy/decompression microdiscectomy  02/19/2011    Procedure: LUMBAR LAMINECTOMY/DECOMPRESSION MICRODISCECTOMY;  Surgeon: Johnn Hai;  Location: [facility];  Service: Orthopedics;  Laterality: Left;  decompression l5-s1 l4-5 on left     Current Outpatient  Prescriptions  Medication Sig Dispense Refill  . Albiglutide (TANZEUM) 50 MG PEN Inject 50 mg into the skin once a week.    Marland Kitchen alprazolam (XANAX) 2 MG tablet Take 2 mg by mouth 3 (three) times daily as needed for anxiety.     Marland Kitchen atenolol (TENORMIN) 25 MG tablet Take 25 mg by mouth every morning.    Marland Kitchen atorvastatin (LIPITOR) 40 MG tablet Take 40 mg by mouth daily.    Marland Kitchen lisinopril (PRINIVIL,ZESTRIL) 2.5 MG tablet Take 2.5 mg by mouth daily.    . metFORMIN (GLUCOPHAGE) 1000 MG tablet Take 1,000 mg by mouth 2 (two) times daily with a meal.     . omega-3 acid ethyl esters (LOVAZA) 1 G capsule Take 1 g by mouth daily.    . pantoprazole (PROTONIX) 40 MG tablet Take 40 mg by mouth daily.    . selegiline (EMSAM) 6 MG/24HR Place 1 patch onto the skin every evening.     Marland Kitchen TOUJEO SOLOSTAR 300 UNIT/ML SOPN Inject 70 Units into the skin daily.   0  . vitamin E 400 UNIT capsule Take 400  Units by mouth daily.    . zaleplon (SONATA) 10 MG capsule Take 10 mg by mouth at bedtime. May take an additional 10mg  by mouth if awakens during the night  0   No current facility-administered medications for this visit.    Allergies:   Canagliflozin; Cyclobenzaprine; Meperidine; Vicodin; Demerol; Flexeril; Vicodin; and Nsaids    Social History:  The patient  reports that he quit smoking about 28 years ago. He does not have any smokeless tobacco history on file. He reports that he does not drink alcohol or use illicit drugs.   Family History:  The patient's family history includes Cancer in his mother.    ROS:  Please see the history of present illness.   Otherwise, review of systems are positive for none.   All other systems are reviewed and negative.    PHYSICAL EXAM: VS:  BP 114/58 mmHg  Pulse 73  Ht 6' (1.829 m)  Wt 293 lb (132.904 kg)  BMI 39.73 kg/m2 , BMI Body mass index is 39.73 kg/(m^2). GEN: Well nourished, well developed, in no acute distress HEENT: normal Neck: no JVD, carotid bruits, or  masses Cardiac: RRR; no murmurs, rubs, or gallops,no edema  Respiratory:  clear to auscultation bilaterally, normal work of breathing GI: soft, nontender, nondistended, + BS MS: no deformity or atrophy Skin: warm and dry, no rash Neuro:  Strength and sensation are intact Psych: euthymic mood, full affect   EKG:  EKG is not ordered today.    Recent Labs: No results found for requested labs within last 365 days.    Lipid Panel No results found for: CHOL, TRIG, HDL, CHOLHDL, VLDL, LDLCALC, LDLDIRECT    Wt Readings from Last 3 Encounters:  11/29/14 293 lb (132.904 kg)  05/19/14 278 lb 1.9 oz (126.154 kg)  10/13/13 282 lb 3.2 oz (128.005 kg)    ASSESSMENT AND PLAN:  1. OSA on CPAP therapy and tolerating well - his download today showed an AHI of 0.2/hr on 15cm H2O and 100% compliance in using more than 4 hours nightly. He will continue on current CPAP settings 2. HTN - well controlled - continue Tenormin/Lisinopril       3.   Obesity - His exercise is limited due to chronic back and knee pain   Current medicines are reviewed at length with the patient today.  The patient does not have concerns regarding medicines.  The following changes have been made:  no change  Labs/ tests ordered today: See above Assessment and Plan No orders of the defined types were placed in this encounter.     Disposition:   FU with me in 1 year  Signed, Sueanne Margarita, MD  11/29/2014 8:49 AM    [address], [place], [place]  [address] Phone: [phone]; Fax: [phone]

## 2015-01-25 ENCOUNTER — Ambulatory Visit: Payer: Self-pay | Admitting: Physician Assistant

## 2015-01-31 ENCOUNTER — Ambulatory Visit: Payer: Self-pay | Admitting: Physician Assistant

## 2015-01-31 ENCOUNTER — Encounter (HOSPITAL_COMMUNITY): Payer: Self-pay

## 2015-01-31 ENCOUNTER — Encounter (HOSPITAL_COMMUNITY)
Admission: RE | Admit: 2015-01-31 | Discharge: 2015-01-31 | Disposition: A | Discharge status: Home / Self Care | Payer: BLUE CROSS/BLUE SHIELD | Source: Ambulatory Visit | Attending: Orthopedic Surgery | Admitting: Orthopedic Surgery

## 2015-01-31 DIAGNOSIS — E119 Type 2 diabetes mellitus without complications: Secondary | ICD-10-CM | POA: Diagnosis not present

## 2015-01-31 DIAGNOSIS — I10 Essential (primary) hypertension: Secondary | ICD-10-CM | POA: Diagnosis not present

## 2015-01-31 DIAGNOSIS — Z79899 Other long term (current) drug therapy: Secondary | ICD-10-CM | POA: Diagnosis not present

## 2015-01-31 DIAGNOSIS — D696 Thrombocytopenia, unspecified: Secondary | ICD-10-CM | POA: Diagnosis not present

## 2015-01-31 DIAGNOSIS — G8929 Other chronic pain: Secondary | ICD-10-CM | POA: Diagnosis not present

## 2015-01-31 DIAGNOSIS — G4733 Obstructive sleep apnea (adult) (pediatric): Secondary | ICD-10-CM | POA: Diagnosis not present

## 2015-01-31 DIAGNOSIS — Z87891 Personal history of nicotine dependence: Secondary | ICD-10-CM | POA: Diagnosis not present

## 2015-01-31 DIAGNOSIS — E785 Hyperlipidemia, unspecified: Secondary | ICD-10-CM | POA: Diagnosis not present

## 2015-01-31 DIAGNOSIS — R339 Retention of urine, unspecified: Secondary | ICD-10-CM | POA: Diagnosis not present

## 2015-01-31 DIAGNOSIS — M79662 Pain in left lower leg: Secondary | ICD-10-CM | POA: Diagnosis not present

## 2015-01-31 HISTORY — DX: Disease of blood and blood-forming organs, unspecified: D75.9

## 2015-01-31 LAB — CBC
HCT: 41.8 % (ref 39.0–52.0)
Hemoglobin: 14.2 g/dL (ref 13.0–17.0)
MCH: 29.7 pg (ref 26.0–34.0)
MCHC: 34 g/dL (ref 30.0–36.0)
MCV: 87.4 fL (ref 78.0–100.0)
Platelets: 56 10*3/uL — ABNORMAL LOW (ref 150–400)
RBC: 4.78 MIL/uL (ref 4.22–5.81)
RDW: 14.1 % (ref 11.5–15.5)
WBC: 4.7 10*3/uL (ref 4.0–10.5)

## 2015-01-31 LAB — COMPREHENSIVE METABOLIC PANEL
ALT: 40 U/L (ref 17–63)
AST: 44 U/L — ABNORMAL HIGH (ref 15–41)
Albumin: 3.6 g/dL (ref 3.5–5.0)
Alkaline Phosphatase: 81 U/L (ref 38–126)
Anion gap: 8 (ref 5–15)
BUN: 11 mg/dL (ref 6–20)
CO2: 27 mmol/L (ref 22–32)
Calcium: 9.3 mg/dL (ref 8.9–10.3)
Chloride: 101 mmol/L (ref 101–111)
Creatinine, Ser: 0.92 mg/dL (ref 0.61–1.24)
GFR calc Af Amer: >60 mL/min (ref >60)
GFR calc non Af Amer: >60 mL/min (ref >60)
Glucose, Bld: 363 mg/dL — ABNORMAL HIGH (ref 65–99)
Potassium: 4.1 mmol/L (ref 3.5–5.1)
Sodium: 136 mmol/L (ref 135–145)
Total Bilirubin: 1.1 mg/dL (ref 0.3–1.2)
Total Protein: 6.7 g/dL (ref 6.5–8.1)

## 2015-01-31 LAB — SURGICAL PCR SCREEN
MRSA, PCR: NEGATIVE
Staphylococcus aureus: NEGATIVE

## 2015-01-31 LAB — GLUCOSE, CAPILLARY: Glucose-Capillary: 305 mg/dL — ABNORMAL HIGH (ref 65–99)

## 2015-01-31 NOTE — Pre-Procedure Instructions (Signed)
AKEIM REFUERZO  01/31/2015      [facility], [place] [place] [address] Phone: [phone] Fax: [phone]    Your procedure is scheduled on 02/02/2015.  Report to [facility] Admitting at 11:00 A.M.  Call this number if you have problems the morning of surgery:  [phone]   Remember:  Do not eat food or drink liquids after midnight.  On TUESDAY  Take these medicines the morning of surgery with A SIP OF WATER :  XANAX if needed, Atenolol, Pain medicine if needed, Protonix..... instructions regarding Toujeo for morning of surgery.   Do not wear jewelry     Do not wear lotions, powders, or perfumes.  You may wear deodorant.   .  Men may shave face and neck.   Do not bring valuables to the hospital.   [facility] is not responsible for any belongings or valuables.   Contacts, dentures or bridgework may not be worn into surgery.  Leave your suitcase in the car.  After surgery it may be brought to your room.  For patients admitted to the hospital, discharge time will be determined by your treatment team.  Patients discharged the day of surgery will not be allowed to drive home.   Name and phone number of your driver:   With wife  Special instructions:  Special Instructions: [place] - Preparing for Surgery  Before surgery, you can play an important role.  Because skin is not sterile, your skin needs to be as free of germs as possible.  You can reduce the number of germs on you skin by washing with CHG (chlorahexidine gluconate) soap before surgery.  CHG is an antiseptic cleaner which kills germs and bonds with the skin to continue killing germs even after washing.  Please DO NOT use if you have an allergy to CHG or antibacterial soaps.  If your skin becomes reddened/irritated stop using the CHG and inform your nurse when you arrive at Short Stay.  Do not shave (including  legs and underarms) for at least 48 hours prior to the first CHG shower.  You may shave your face.  Please follow these instructions carefully:   1.  Shower with CHG Soap the night before surgery and the  morning of Surgery.  2.  If you choose to wash your hair, wash your hair first as usual with your  normal shampoo.  3.  After you shampoo, rinse your hair and body thoroughly to remove the  Shampoo.  4.  Use CHG as you would any other liquid soap.  You can apply chg directly to the skin and wash gently with scrungie or a clean washcloth.  5.  Apply the CHG Soap to your body ONLY FROM THE NECK DOWN.    Do not use on open wounds or open sores.  Avoid contact with your eyes, ears, mouth and genitals (private parts).  Wash genitals (private parts)   with your normal soap.  6.  Wash thoroughly, paying special attention to the area where your surgery will be performed.  7.  Thoroughly rinse your body with warm water from the neck down.  8.  DO NOT shower/wash with your normal soap after using and rinsing off   the CHG Soap.  9.  Pat yourself dry with a clean towel.            10.  Wear clean  pajamas.            11.  Place clean sheets on your bed the night of your first shower and do not sleep with pets.  Day of Surgery  Do not apply any lotions/deodorants the morning of surgery.  Please wear clean clothes to the hospital/surgery center.  Please read over the following fact sheets that you were given. Pain Booklet, Coughing and Deep Breathing, Blood Transfusion Information, MRSA Information and Surgical Site Infection Prevention

## 2015-01-31 NOTE — Pre-Procedure Instructions (Signed)
    CONNOR HATTER  01/31/2015      [facility], [place] [address] Phone: [phone] Fax: [phone]    How to Manage Your Diabetes Before Surgery   Why is it important to control my blood sugar before and after surgery?   Improving blood sugar levels before and after surgery helps healing and can limit problems.  A way of improving blood sugar control is eating a healthy diet by:  - Eating less sugar and carbohydrates  - Increasing activity/exercise  - Talk with your doctor about reaching your blood sugar goals  High blood sugars (greater than 180 mg/dL) can raise your risk of infections and slow down your recovery so you will need to focus on controlling your diabetes during the weeks before surgery.  Make sure that the doctor who takes care of your diabetes knows about your planned surgery including the date and location.  How do I manage my blood sugars before surgery?   Check your blood sugar at least 4 times a day, 2 days before surgery to make sure that they are not too high or low.   Check your blood sugar the morning of your surgery when you wake up and every 2               hours until you get to the Short-Stay unit.  If your blood sugar is less than 70 mg/dL, you will need to treat for low blood sugar by:  Treat a low blood sugar (less than 70 mg/dL) with 1/2 cup of clear juice (cranberry or apple), 4 glucose tablets, OR glucose gel.  Recheck blood sugar in 15 minutes after treatment (to make sure it is greater than 70 mg/dL).  If blood sugar is not greater than 70 mg/dL on re-check, call [phone] for further instructions.   Report your blood sugar to the Short-Stay nurse when you get to Short-Stay.  References:  [facility], 2007 "How to Manage your Diabetes Before and After Surgery".  What do I do about my diabetes medications?   Do  not take oral diabetes medicines (pills) the morning of surgery.----- NO METFORMIN    THE NIGHT BEFORE SURGERY, -  NOTHING DIFFERENT- SAME AS ALWAYS    THE MORNING OF SURGERY, take 40 units of   TOUJEGO  Insulin.

## 2015-01-31 NOTE — Progress Notes (Signed)
Call to A. Kabbe,FNP, due to order for consult & pt. H/o thrombocytopenia & order for PLT. Transfusion for DOS. Pt. Denies ever having any advance cardiac testing.  Pt. Sees Dr. Moreen Fowler for PCP, along with multiple other specialists. Has been followed by Dr. Radford Pax for sleep apnea, she saw him in the last 3 months & wants to see him again in one yr.  Pt. Was sent to [facility] accidentally, not followed for any cardiac problem. See Dr. Candis Schatz for psych. & started him on Emsan patch for depression, when nothing else was working. Sees Dr. Hartford Poli for diabetes management. Also sees Dr. Nelva Bush for pain management.  Pt. Aware of bld. Sugar today & cautioned about his next 48 hr. Intake. Pt. Given complete instructions about how to manage diabetes, leading up to surgery.

## 2015-02-01 LAB — HEMOGLOBIN A1C
Hgb A1c MFr Bld: 10 % — ABNORMAL HIGH (ref 4.8–5.6)
Mean Plasma Glucose: 240 mg/dL

## 2015-02-01 MED ORDER — DEXTROSE 5 % IV SOLN
3.0000 g | INTRAVENOUS | Status: AC
  Administered 2015-02-02: 3 g via INTRAVENOUS
  Filled 2015-02-01: qty 3000

## 2015-02-01 MED ORDER — SODIUM CHLORIDE 0.9 % IV SOLN
Freq: Once | INTRAVENOUS | Status: AC
  Administered 2015-02-02: 13:00:00 via INTRAVENOUS

## 2015-02-01 NOTE — Progress Notes (Signed)
Anesthesia Chart Review:  Pt is 54 year old [name] scheduled for lumbar spinal cord stimulator insertion on 02/02/2015 with Dr. Rolena Infante.   PCP is Dr. Antony Contras. Sees Dr. Radford Pax for OSA. Endocrinologist is Dr. Francetta Found (care everywhere).   PMH includes:  HTN, DM, hyperlipidemia, thrombocytopenia, hepatitis B, OSA (on CPAP), splenomegaly, GERD. Former smoker. BMI 39. S/p lumbar laminectomy 02/19/11.   Medications include: albigultide, atenolol, lipitor, lisinopril, metformin, protonix, selegiline patch, toujeo.   Preoperative labs reviewed.  Glucose 363, hgbA1c 10. Called and spoke with patient; today his blood sugar was 140. He understands surgery may be cancelled if he arrives DOS with blood sugar over 200. Platelets 56, consistent with prior results over last 4 years. Pt reports his PCP manages his thrombocytopenia and is aware of his upcoming surgery. Dr. Rolena Infante' office has ordered platelets for pt for surgery.   EKG 05/19/14: probably ectopic atrial rhythm, normal axis, no ST changes.   Reviewed case with Dr. Glennon Mac. She requests that pt not change patch in the AM DOS, but instead leave old one on. Pt can then replace patch after surgery is over.   If blood glucose acceptable DOS, I anticipate pt can proceed as scheduled.   Willeen Cass, FNP-BC [facility]/Anesthesiology Phone: [phone] 02/01/2015 1:58 PM

## 2015-02-02 ENCOUNTER — Ambulatory Visit (HOSPITAL_COMMUNITY): Payer: BLUE CROSS/BLUE SHIELD | Admitting: Anesthesiology

## 2015-02-02 ENCOUNTER — Ambulatory Visit (HOSPITAL_COMMUNITY): Payer: BLUE CROSS/BLUE SHIELD | Admitting: Emergency Medicine

## 2015-02-02 ENCOUNTER — Encounter (HOSPITAL_COMMUNITY)
Admission: RE | Disposition: A | Discharge status: Home / Self Care | Payer: BLUE CROSS/BLUE SHIELD | Source: Ambulatory Visit | Attending: Orthopedic Surgery

## 2015-02-02 ENCOUNTER — Ambulatory Visit (HOSPITAL_COMMUNITY): Payer: BLUE CROSS/BLUE SHIELD

## 2015-02-02 ENCOUNTER — Observation Stay (HOSPITAL_COMMUNITY)
Admission: RE | Admit: 2015-02-02 | Discharge: 2015-02-05 | Disposition: A | Discharge status: Home / Self Care | Payer: BLUE CROSS/BLUE SHIELD | Source: Ambulatory Visit | Attending: Orthopedic Surgery | Admitting: Orthopedic Surgery

## 2015-02-02 DIAGNOSIS — G4733 Obstructive sleep apnea (adult) (pediatric): Secondary | ICD-10-CM | POA: Insufficient documentation

## 2015-02-02 DIAGNOSIS — G8929 Other chronic pain: Principal | ICD-10-CM | POA: Diagnosis present

## 2015-02-02 DIAGNOSIS — Z87891 Personal history of nicotine dependence: Secondary | ICD-10-CM | POA: Insufficient documentation

## 2015-02-02 DIAGNOSIS — E785 Hyperlipidemia, unspecified: Secondary | ICD-10-CM | POA: Insufficient documentation

## 2015-02-02 DIAGNOSIS — R339 Retention of urine, unspecified: Secondary | ICD-10-CM | POA: Insufficient documentation

## 2015-02-02 DIAGNOSIS — M79662 Pain in left lower leg: Secondary | ICD-10-CM | POA: Insufficient documentation

## 2015-02-02 DIAGNOSIS — I10 Essential (primary) hypertension: Secondary | ICD-10-CM | POA: Insufficient documentation

## 2015-02-02 DIAGNOSIS — Z79899 Other long term (current) drug therapy: Secondary | ICD-10-CM | POA: Insufficient documentation

## 2015-02-02 DIAGNOSIS — D696 Thrombocytopenia, unspecified: Secondary | ICD-10-CM | POA: Insufficient documentation

## 2015-02-02 DIAGNOSIS — Z419 Encounter for procedure for purposes other than remedying health state, unspecified: Secondary | ICD-10-CM

## 2015-02-02 DIAGNOSIS — M25511 Pain in right shoulder: Secondary | ICD-10-CM

## 2015-02-02 DIAGNOSIS — E119 Type 2 diabetes mellitus without complications: Secondary | ICD-10-CM | POA: Insufficient documentation

## 2015-02-02 HISTORY — PX: SPINAL CORD STIMULATOR INSERTION: SHX5378

## 2015-02-02 LAB — GLUCOSE, CAPILLARY
Glucose-Capillary: 172 mg/dL — ABNORMAL HIGH (ref 65–99)
Glucose-Capillary: 139 mg/dL — ABNORMAL HIGH (ref 65–99)
Glucose-Capillary: 119 mg/dL — ABNORMAL HIGH (ref 65–99)
Glucose-Capillary: 118 mg/dL — ABNORMAL HIGH (ref 65–99)

## 2015-02-02 SURGERY — INSERTION, SPINAL CORD STIMULATOR, LUMBAR
Anesthesia: General | Site: Back

## 2015-02-02 MED ORDER — PROPOFOL 10 MG/ML IV BOLUS
INTRAVENOUS | Status: AC
  Filled 2015-02-02: qty 20

## 2015-02-02 MED ORDER — ACETAMINOPHEN 10 MG/ML IV SOLN
INTRAVENOUS | Status: AC
  Filled 2015-02-02: qty 100

## 2015-02-02 MED ORDER — SODIUM CHLORIDE 0.9 % IV SOLN: 250.0000 mL | INTRAVENOUS | Status: DC

## 2015-02-02 MED ORDER — MEPERIDINE HCL 25 MG/ML IJ SOLN: 6.2500 mg | INTRAMUSCULAR | Status: DC | PRN

## 2015-02-02 MED ORDER — SELEGILINE 6 MG/24HR TD PT24
6.0000 mg | MEDICATED_PATCH | Freq: Every day | TRANSDERMAL | Status: DC
Start: 2015-02-02 — End: 2015-02-02
  Filled 2015-02-02: qty 1

## 2015-02-02 MED ORDER — SODIUM CHLORIDE 0.9 % IJ SOLN
INTRAMUSCULAR | Status: AC
  Filled 2015-02-02: qty 10

## 2015-02-02 MED ORDER — SELEGILINE 6 MG/24HR TD PT24
6.0000 mg | MEDICATED_PATCH | Freq: Every day | TRANSDERMAL | Status: DC
  Administered 2015-02-02 – 2015-02-05 (×3): 6 mg via TRANSDERMAL

## 2015-02-02 MED ORDER — FENTANYL CITRATE (PF) 100 MCG/2ML IJ SOLN
INTRAMUSCULAR | Status: DC | PRN
  Administered 2015-02-02 (×5): 50 ug via INTRAVENOUS

## 2015-02-02 MED ORDER — MENTHOL 3 MG MT LOZG: 1.0000 | LOZENGE | OROMUCOSAL | Status: DC | PRN

## 2015-02-02 MED ORDER — LIDOCAINE HCL (CARDIAC) 20 MG/ML IV SOLN
INTRAVENOUS | Status: DC | PRN
  Administered 2015-02-02: 80 mg via INTRAVENOUS

## 2015-02-02 MED ORDER — ROCURONIUM BROMIDE 50 MG/5ML IV SOLN
INTRAVENOUS | Status: AC
  Filled 2015-02-02: qty 1

## 2015-02-02 MED ORDER — PROPOFOL 10 MG/ML IV BOLUS
INTRAVENOUS | Status: DC | PRN
  Administered 2015-02-02: 50 mg via INTRAVENOUS
  Administered 2015-02-02: 150 mg via INTRAVENOUS

## 2015-02-02 MED ORDER — LACTATED RINGERS IV SOLN: INTRAVENOUS | Status: DC

## 2015-02-02 MED ORDER — HEMOSTATIC AGENTS (NO CHARGE) OPTIME
TOPICAL | Status: DC | PRN
  Administered 2015-02-02 (×3): 1 via TOPICAL

## 2015-02-02 MED ORDER — LIDOCAINE HCL (CARDIAC) 20 MG/ML IV SOLN
INTRAVENOUS | Status: AC
  Filled 2015-02-02: qty 5

## 2015-02-02 MED ORDER — ACETAMINOPHEN 10 MG/ML IV SOLN
INTRAVENOUS | Status: DC | PRN
  Administered 2015-02-02: 1000 mg via INTRAVENOUS

## 2015-02-02 MED ORDER — DEXTROSE 5 % IV SOLN
500.0000 mg | Freq: Four times a day (QID) | INTRAVENOUS | Status: DC | PRN
  Filled 2015-02-02: qty 5

## 2015-02-02 MED ORDER — PHENYLEPHRINE HCL 10 MG/ML IJ SOLN
INTRAMUSCULAR | Status: DC | PRN
  Administered 2015-02-02 (×2): 80 ug via INTRAVENOUS
  Administered 2015-02-02: 120 ug via INTRAVENOUS

## 2015-02-02 MED ORDER — HYDROMORPHONE HCL 1 MG/ML IJ SOLN
INTRAMUSCULAR | Status: AC
  Filled 2015-02-02: qty 1

## 2015-02-02 MED ORDER — MIDAZOLAM HCL 5 MG/5ML IJ SOLN
INTRAMUSCULAR | Status: DC | PRN
  Administered 2015-02-02: 2 mg via INTRAVENOUS

## 2015-02-02 MED ORDER — MIDAZOLAM HCL 2 MG/2ML IJ SOLN
INTRAMUSCULAR | Status: AC
  Filled 2015-02-02: qty 2

## 2015-02-02 MED ORDER — SUGAMMADEX SODIUM 500 MG/5ML IV SOLN
INTRAVENOUS | Status: DC | PRN
  Administered 2015-02-02: 260 mg via INTRAVENOUS

## 2015-02-02 MED ORDER — THROMBIN 20000 UNITS EX SOLR
CUTANEOUS | Status: AC
  Filled 2015-02-02: qty 20000

## 2015-02-02 MED ORDER — CEFAZOLIN SODIUM 1-5 GM-% IV SOLN
1.0000 g | Freq: Three times a day (TID) | INTRAVENOUS | Status: AC
  Administered 2015-02-02 – 2015-02-03 (×2): 1 g via INTRAVENOUS
  Filled 2015-02-02 (×2): qty 50

## 2015-02-02 MED ORDER — BUPIVACAINE-EPINEPHRINE (PF) 0.25% -1:200000 IJ SOLN
INTRAMUSCULAR | Status: AC
  Filled 2015-02-02: qty 30

## 2015-02-02 MED ORDER — ALBIGLUTIDE 50 MG ~~LOC~~ PEN: 50.0000 mg | PEN_INJECTOR | SUBCUTANEOUS | Status: DC

## 2015-02-02 MED ORDER — OMEGA-3-ACID ETHYL ESTERS 1 G PO CAPS
1.0000 g | ORAL_CAPSULE | Freq: Every day | ORAL | Status: DC
  Administered 2015-02-04 – 2015-02-05 (×2): 1 g via ORAL
  Filled 2015-02-02 (×3): qty 1

## 2015-02-02 MED ORDER — ARTIFICIAL TEARS OP OINT
TOPICAL_OINTMENT | OPHTHALMIC | Status: AC
  Filled 2015-02-02: qty 7

## 2015-02-02 MED ORDER — SODIUM CHLORIDE 0.9 % IJ SOLN
3.0000 mL | Freq: Two times a day (BID) | INTRAMUSCULAR | Status: DC
  Administered 2015-02-02 – 2015-02-03 (×2): 3 mL via INTRAVENOUS

## 2015-02-02 MED ORDER — DEXTROSE 5 % IV SOLN
10.0000 mg | INTRAVENOUS | Status: DC | PRN
  Administered 2015-02-02: 30 ug/min via INTRAVENOUS

## 2015-02-02 MED ORDER — SUGAMMADEX SODIUM 500 MG/5ML IV SOLN
INTRAVENOUS | Status: AC
  Filled 2015-02-02: qty 5

## 2015-02-02 MED ORDER — LACTATED RINGERS IV SOLN
INTRAVENOUS | Status: DC | PRN
  Administered 2015-02-02 (×2): via INTRAVENOUS

## 2015-02-02 MED ORDER — LISINOPRIL 2.5 MG PO TABS
2.5000 mg | ORAL_TABLET | Freq: Every day | ORAL | Status: DC
  Administered 2015-02-02 – 2015-02-05 (×4): 2.5 mg via ORAL
  Filled 2015-02-02 (×5): qty 1

## 2015-02-02 MED ORDER — ONDANSETRON HCL 4 MG/2ML IJ SOLN
INTRAMUSCULAR | Status: DC | PRN
  Administered 2015-02-02: 4 mg via INTRAVENOUS

## 2015-02-02 MED ORDER — AMOXICILLIN 500 MG PO CAPS
500.0000 mg | ORAL_CAPSULE | Freq: Four times a day (QID) | ORAL | Status: DC
  Administered 2015-02-02 – 2015-02-05 (×9): 500 mg via ORAL
  Filled 2015-02-02 (×10): qty 1

## 2015-02-02 MED ORDER — ALPRAZOLAM 0.5 MG PO TABS
2.0000 mg | ORAL_TABLET | Freq: Three times a day (TID) | ORAL | Status: DC | PRN
Start: 2015-02-02 — End: 2015-02-04
  Administered 2015-02-03 – 2015-02-04 (×3): 2 mg via ORAL
  Filled 2015-02-02: qty 8
  Filled 2015-02-02 (×2): qty 4

## 2015-02-02 MED ORDER — INSULIN GLARGINE 300 UNIT/ML ~~LOC~~ SOPN
80.0000 [IU] | PEN_INJECTOR | Freq: Every day | SUBCUTANEOUS | Status: DC
  Administered 2015-02-03 – 2015-02-04 (×2): 80 [IU] via SUBCUTANEOUS

## 2015-02-02 MED ORDER — ARTIFICIAL TEARS OP OINT
TOPICAL_OINTMENT | OPHTHALMIC | Status: DC | PRN
  Administered 2015-02-02: 1 via OPHTHALMIC

## 2015-02-02 MED ORDER — ATORVASTATIN CALCIUM 40 MG PO TABS
40.0000 mg | ORAL_TABLET | Freq: Every day | ORAL | Status: DC
  Administered 2015-02-03 – 2015-02-05 (×3): 40 mg via ORAL
  Filled 2015-02-02 (×3): qty 1

## 2015-02-02 MED ORDER — MORPHINE SULFATE (PF) 2 MG/ML IV SOLN
1.0000 mg | INTRAVENOUS | Status: DC | PRN
  Administered 2015-02-05: 2 mg via INTRAVENOUS
  Filled 2015-02-02: qty 1

## 2015-02-02 MED ORDER — SODIUM CHLORIDE 0.9 % IJ SOLN: 3.0000 mL | INTRAMUSCULAR | Status: DC | PRN

## 2015-02-02 MED ORDER — HYDROMORPHONE HCL 1 MG/ML IJ SOLN
0.2500 mg | INTRAMUSCULAR | Status: DC | PRN
  Administered 2015-02-02 (×4): 0.5 mg via INTRAVENOUS

## 2015-02-02 MED ORDER — ATENOLOL 25 MG PO TABS
25.0000 mg | ORAL_TABLET | Freq: Every morning | ORAL | Status: DC
  Administered 2015-02-03 – 2015-02-05 (×3): 25 mg via ORAL
  Filled 2015-02-02 (×3): qty 1

## 2015-02-02 MED ORDER — METFORMIN HCL 500 MG PO TABS
1000.0000 mg | ORAL_TABLET | Freq: Two times a day (BID) | ORAL | Status: DC
  Administered 2015-02-03 – 2015-02-05 (×4): 1000 mg via ORAL
  Filled 2015-02-02 (×4): qty 2

## 2015-02-02 MED ORDER — LACTATED RINGERS IV SOLN
INTRAVENOUS | Status: DC
  Administered 2015-02-02 – 2015-02-03 (×2): via INTRAVENOUS

## 2015-02-02 MED ORDER — BUPIVACAINE-EPINEPHRINE 0.25% -1:200000 IJ SOLN
INTRAMUSCULAR | Status: DC | PRN
Start: 2015-02-02 — End: 2015-02-02
  Administered 2015-02-02: 10 mL

## 2015-02-02 MED ORDER — ONDANSETRON HCL 4 MG/2ML IJ SOLN: 4.0000 mg | INTRAMUSCULAR | Status: DC | PRN

## 2015-02-02 MED ORDER — HYDROMORPHONE HCL 2 MG PO TABS
8.0000 mg | ORAL_TABLET | Freq: Four times a day (QID) | ORAL | Status: DC | PRN
  Administered 2015-02-02 – 2015-02-03 (×2): 8 mg via ORAL
  Filled 2015-02-02 (×2): qty 4

## 2015-02-02 MED ORDER — SUCCINYLCHOLINE CHLORIDE 20 MG/ML IJ SOLN
INTRAMUSCULAR | Status: AC
  Filled 2015-02-02: qty 1

## 2015-02-02 MED ORDER — ONDANSETRON HCL 4 MG/2ML IJ SOLN
INTRAMUSCULAR | Status: AC
  Filled 2015-02-02: qty 2

## 2015-02-02 MED ORDER — SODIUM CHLORIDE 0.9 % IV SOLN
INTRAVENOUS | Status: DC | PRN
  Administered 2015-02-02: 16:00:00 via INTRAVENOUS

## 2015-02-02 MED ORDER — INSULIN ASPART 100 UNIT/ML ~~LOC~~ SOLN
0.0000 [IU] | SUBCUTANEOUS | Status: DC
  Administered 2015-02-03: 2 [IU] via SUBCUTANEOUS
  Administered 2015-02-03: 5 [IU] via SUBCUTANEOUS
  Administered 2015-02-03: 2 [IU] via SUBCUTANEOUS
  Administered 2015-02-03: 5 [IU] via SUBCUTANEOUS
  Administered 2015-02-04 (×3): 3 [IU] via SUBCUTANEOUS
  Administered 2015-02-04 (×2): 2 [IU] via SUBCUTANEOUS
  Administered 2015-02-05 (×4): 3 [IU] via SUBCUTANEOUS

## 2015-02-02 MED ORDER — EPHEDRINE SULFATE 50 MG/ML IJ SOLN
INTRAMUSCULAR | Status: AC
  Filled 2015-02-02: qty 1

## 2015-02-02 MED ORDER — PROMETHAZINE HCL 25 MG/ML IJ SOLN: 6.2500 mg | INTRAMUSCULAR | Status: DC | PRN

## 2015-02-02 MED ORDER — PHENOL 1.4 % MT LIQD: 1.0000 | OROMUCOSAL | Status: DC | PRN

## 2015-02-02 MED ORDER — FENTANYL CITRATE (PF) 250 MCG/5ML IJ SOLN
INTRAMUSCULAR | Status: AC
  Filled 2015-02-02: qty 5

## 2015-02-02 MED ORDER — MORPHINE SULFATE ER 30 MG PO TBCR
30.0000 mg | EXTENDED_RELEASE_TABLET | Freq: Two times a day (BID) | ORAL | Status: DC
  Administered 2015-02-02 – 2015-02-04 (×4): 30 mg via ORAL
  Filled 2015-02-02 (×5): qty 1

## 2015-02-02 MED ORDER — 0.9 % SODIUM CHLORIDE (POUR BTL) OPTIME
TOPICAL | Status: DC | PRN
  Administered 2015-02-02: 1000 mL

## 2015-02-02 MED ORDER — THROMBIN 20000 UNITS EX KIT
PACK | CUTANEOUS | Status: DC | PRN
  Administered 2015-02-02: 20 mL via TOPICAL

## 2015-02-02 MED ORDER — METHOCARBAMOL 500 MG PO TABS: 500.0000 mg | ORAL_TABLET | Freq: Four times a day (QID) | ORAL | Status: DC | PRN

## 2015-02-02 MED ORDER — ROCURONIUM BROMIDE 100 MG/10ML IV SOLN
INTRAVENOUS | Status: DC | PRN
  Administered 2015-02-02: 10 mg via INTRAVENOUS
  Administered 2015-02-02: 20 mg via INTRAVENOUS
  Administered 2015-02-02: 50 mg via INTRAVENOUS
  Administered 2015-02-02: 10 mg via INTRAVENOUS

## 2015-02-02 SURGICAL SUPPLY — 61 items
15 FR. ROUND HUBLESS FULL FLUTED CHANNEL DRAIN ×2 IMPLANT
CANISTER SUCTION 2500CC (MISCELLANEOUS) ×2 IMPLANT
CLSR STERI-STRIP ANTIMIC 1/2X4 (GAUZE/BANDAGES/DRESSINGS) ×4 IMPLANT
COVER PROBE W GEL 5X96 (DRAPES) ×2 IMPLANT
COVER SURGICAL LIGHT HANDLE (MISCELLANEOUS) ×2 IMPLANT
DRAIN CHANNEL 19F RND (DRAIN) IMPLANT
DRAPE C-ARM 42X72 X-RAY (DRAPES) ×2 IMPLANT
DRAPE C-ARMOR (DRAPES) ×2 IMPLANT
DRAPE INCISE IOBAN 85X60 (DRAPES) ×2 IMPLANT
DRAPE SURG 17X23 STRL (DRAPES) ×2 IMPLANT
DRAPE U-SHAPE 47X51 STRL (DRAPES) ×4 IMPLANT
DRSG MEPILEX BORDER 4X4 (GAUZE/BANDAGES/DRESSINGS) ×4 IMPLANT
DRSG MEPILEX BORDER 4X8 (GAUZE/BANDAGES/DRESSINGS) ×4 IMPLANT
DURAPREP 26ML APPLICATOR (WOUND CARE) ×2 IMPLANT
ELECT BLADE 4.0 EZ CLEAN MEGAD (MISCELLANEOUS) ×2
ELECT CAUTERY BLADE 6.4 (BLADE) IMPLANT
ELECT PENCIL ROCKER SW 15FT (MISCELLANEOUS) ×2 IMPLANT
ELECT REM PT RETURN 9FT ADLT (ELECTROSURGICAL) ×2
ELECTRODE BLDE 4.0 EZ CLN MEGD (MISCELLANEOUS) ×1 IMPLANT
ELECTRODE REM PT RTRN 9FT ADLT (ELECTROSURGICAL) ×1 IMPLANT
EVACUATOR SILICONE 100CC (DRAIN) ×2 IMPLANT
GENERATOR PULSE PROCLAIM 5ELIT (Neuro Prosthesis/Implant) ×1 IMPLANT
GLOVE BIOGEL PI IND STRL 8.5 (GLOVE) ×1 IMPLANT
GLOVE BIOGEL PI INDICATOR 8.5 (GLOVE) ×1
GLOVE SS N UNI LF 8.5 STRL (GLOVE) ×4 IMPLANT
GOWN STRL REUS W/TWL 2XL LVL3 (GOWN DISPOSABLE) ×2 IMPLANT
KIT BASIN OR (CUSTOM PROCEDURE TRAY) ×2 IMPLANT
KIT LEAD 60CM OCTRODE (Lead) IMPLANT
KIT ROOM TURNOVER OR (KITS) ×2 IMPLANT
LAMI NARROW PRIPOLE 16CH (Orthopedic Implant) ×2 IMPLANT
NDL SUT 6 .5 CRC .975X.05 MAYO (NEEDLE) ×1 IMPLANT
NEEDLE 22X1 1/2 (OR ONLY) (NEEDLE) ×2 IMPLANT
NEEDLE MAYO TAPER (NEEDLE) ×1
NEEDLE SPNL 18GX3.5 QUINCKE PK (NEEDLE) ×4 IMPLANT
NS IRRIG 1000ML POUR BTL (IV SOLUTION) ×2 IMPLANT
PACK LAMINECTOMY ORTHO (CUSTOM PROCEDURE TRAY) ×2 IMPLANT
PACK UNIVERSAL I (CUSTOM PROCEDURE TRAY) ×2 IMPLANT
PAD ARMBOARD 7.5X6 YLW CONV (MISCELLANEOUS) ×6 IMPLANT
PULSE GENERATOR PROCLAIM 5ELIT (Neuro Prosthesis/Implant) ×2 IMPLANT
SPONGE LAP 4X18 X RAY DECT (DISPOSABLE) ×2 IMPLANT
SPONGE SURGIFOAM ABS GEL 100 (HEMOSTASIS) ×2 IMPLANT
STAPLER VISISTAT 35W (STAPLE) ×2 IMPLANT
SURGIFLO W/THROMBIN 8M KIT (HEMOSTASIS) ×6 IMPLANT
SUT BONE WAX W31G (SUTURE) ×2 IMPLANT
SUT ETHILON 3 0 PS 1 (SUTURE) ×2 IMPLANT
SUT FIBERWIRE #2 38 REV NDL BL (SUTURE) ×4
SUT MNCRL AB 3-0 PS2 18 (SUTURE) ×4 IMPLANT
SUT VIC AB 0 CT1 27 (SUTURE) ×1
SUT VIC AB 0 CT1 27XBRD ANBCTR (SUTURE) ×1 IMPLANT
SUT VIC AB 1 CT1 18XCR BRD 8 (SUTURE) ×2 IMPLANT
SUT VIC AB 1 CT1 27 (SUTURE) ×1
SUT VIC AB 1 CT1 27XBRD ANBCTR (SUTURE) ×1 IMPLANT
SUT VIC AB 1 CT1 8-18 (SUTURE) ×2
SUT VIC AB 2-0 CT1 18 (SUTURE) ×4 IMPLANT
SUTURE FIBERWR#2 38 REV NDL BL (SUTURE) ×2 IMPLANT
SYR BULB IRRIGATION 50ML (SYRINGE) ×2 IMPLANT
SYR CONTROL 10ML LL (SYRINGE) ×2 IMPLANT
TOWEL OR 17X24 6PK STRL BLUE (TOWEL DISPOSABLE) ×2 IMPLANT
TOWEL OR 17X26 10 PK STRL BLUE (TOWEL DISPOSABLE) ×2 IMPLANT
TRAY FOLEY CATH 16FRSI W/METER (SET/KITS/TRAYS/PACK) IMPLANT
WATER STERILE IRR 1000ML POUR (IV SOLUTION) IMPLANT

## 2015-02-02 NOTE — Anesthesia Preprocedure Evaluation (Addendum)
Anesthesia Evaluation  Patient identified by MRN, date of birth, ID band Patient awake    Reviewed: Allergy & Precautions, NPO status , Patient's Chart, lab work & pertinent test results  Airway Mallampati: III  TM Distance: >3 FB Neck ROM: Full    Dental  (+) Teeth Intact   Pulmonary sleep apnea , former smoker,    breath sounds clear to auscultation       Cardiovascular hypertension, Pt. on medications and Pt. on home beta blockers  Rhythm:Regular Rate:Normal     Neuro/Psych PSYCHIATRIC DISORDERS Anxiety Depression  Neuromuscular disease    GI/Hepatic GERD  Medicated,(+) Hepatitis -, B  Endo/Other  diabetes, Type 2, Oral Hypoglycemic Agents  Renal/GU CRFRenal disease  negative genitourinary   Musculoskeletal  (+) Arthritis , Osteoarthritis,    Abdominal   Peds negative pediatric ROS (+)  Hematology negative hematology ROS (+)   Anesthesia Other Findings - Thrombocytopenia  Reproductive/Obstetrics negative OB ROS                           Lab Results  Component Value Date   WBC 4.7 01/31/2015   HGB 14.2 01/31/2015   HCT 41.8 01/31/2015   MCV 87.4 01/31/2015   PLT 56* 01/31/2015   Lab Results  Component Value Date   CREATININE 0.92 01/31/2015   BUN 11 01/31/2015   NA 136 01/31/2015   K 4.1 01/31/2015   CL 101 01/31/2015   CO2 27 01/31/2015   Lab Results  Component Value Date   INR 1.15 05/27/2012   INR 1.24 03/11/2012   INR 1.17 02/19/2011   05/2014: EKG: normal sinus rhythm, frequent PVC's noted.   Anesthesia Physical Anesthesia Plan  ASA: III  Anesthesia Plan: General   Post-op Pain Management:    Induction: Intravenous  Airway Management Planned: Oral ETT  Additional Equipment:   Intra-op Plan:   Post-operative Plan: Extubation in OR  Informed Consent: I have reviewed the patients History and Physical, chart, labs and discussed the procedure including the  risks, benefits and alternatives for the proposed anesthesia with the patient or authorized representative who has indicated his/her understanding and acceptance.   Dental advisory given  Plan Discussed with: CRNA  Anesthesia Plan Comments:        Anesthesia Quick Evaluation

## 2015-02-02 NOTE — Transfer of Care (Signed)
Immediate Anesthesia Transfer of Care Note  Patient: Victor [name]  Procedure(s) Performed: Procedure(s): LUMBAR SPINAL CORD STIMULATOR INSERTION (N/A)  Patient Location: PACU  Anesthesia Type:General  Level of Consciousness: awake and oriented  Airway & Oxygen Therapy: Patient Spontanous Breathing and Patient connected to nasal cannula oxygen  Post-op Assessment: Report given to RN and Patient moving all extremities X 4  Post vital signs: Reviewed and stable  Last Vitals:  Filed Vitals:   02/02/15 1315 02/02/15 1335  BP: 106/48 115/55  Pulse: 66 70  Temp: 36.8 C 36.3 C  Resp: 18 18    Complications: No apparent anesthesia complications

## 2015-02-02 NOTE — Op Note (Signed)
NAME:  RAMZI, HARKLEROAD NO.:  [id number]  MEDICAL RECORD NO.:  [id number]  LOCATION:  [facility]                         FACILITY:  [facility]  PHYSICIAN:  Treva Huyett D. Rolena Infante, M.D. DATE OF BIRTH:  Feb 19, 1960  DATE OF PROCEDURE:  02/02/2015 DATE OF DISCHARGE:                              OPERATIVE REPORT   PREOPERATIVE DIAGNOSIS:  Chronic pain syndrome with left leg pain.  POSTOPERATIVE DIAGNOSIS:  Chronic pain syndrome with left leg pain.  OPERATIVE PROCEDURE:  Implantation of spinal cord stimulator using a St. Jude's device with a non-rechargeable battery placed in left buttock region.  COMPLICATIONS:  None.  CONDITION:  Stable.  HISTORY:  This is a very pleasant 54 year old gentleman with chronic thrombocytopenia and chronic debilitating back, buttock, and neuropathic leg pain.  Attempts at conservative management have failed to alleviate his symptoms, but a trial of spinal cord stimulator provided significant relief.  As a result of the successful trial, we elected to proceed with surgery.  I did speak with the hematologist preoperatively because of his chronic thrombocytopenia and we moved forward with the recommendation of 1 unit of platelets 1 hour prior to surgery and then a 2nd unit just at the start of the case.  I did explain to the patient, the risk of bleeding and the potential need to return to the OR emergently for more extensive decompression and hematoma excision, if he develops any focal neurological deficits.  With all of this explained to the patient, he consented and proceeded with surgery.  FIRST ASSISTANT:  Ronette Deter, my PA.  OPERATIVE NOTE:  The patient was brought to the operating room, placed supine on the operating table.  After successful induction of general anesthesia, endotracheal intubation, TEDs, SCDs were applied.  He was turned prone onto the [place] frame.  All bony prominences were well padded and the back was prepped and draped  in a standard fashion.  Time- out was taken to confirm patient, procedure, and all other pertinent important data.  Once this was done, the 2nd unit of platelets was started.  An incision was made.  We used x-ray to identify the T10, T9 pedicles and infiltrated the proposed incision site with 0.25% Marcaine with epi.  The midline incision was made.  Sharp dissection was carried out down to the deep fascia.  Deep fascia was stripped using Cobb elevator and Bovie and I exposed the spinous process and lamina of T10 and T11.  Once this was done bilaterally, self-retaining retractors were placed and in the lateral plane, I counted up from L5 to confirm the T10 pedicle and T10-11 inner spinous process base.  The maximum area of programming success was at the T9 vertebral body level.  A double-action Leksell rongeur was used to remove the bulk of the T10 spinous process. Using a 2 and 3 mm Kerrison punch, laminotomy of T10 was performed.  I used a Penfield 4 to dissect through the central raphe of the ligamentum flavum, and 1 and 2-mm Kerrison punch was used to remove the ligamentum flavum and expose the dorsal surface of the thecal sac.  Bone wax was used to control bleeding on the bony edges.  I then passed my dural elevator which passed with great ease, and so I took the standard tripole lead and with one attempted, it went successfully in the midline up to the T8-9 disk space.  He was under no due tension and it packed without any issue.  I then secured the leads to the spinous process of T11 using FiberWire. The FiberWire was passed through the spinous process, so I had good fixation.  I then made the battery incision site over the left gluteal region, dissected down and created a cavity 4 cm down from the surface and then because of the distance, I had to actually take my submuscular Passer and bring it out midway and then re-passed the wire from the midportion down to the battery site.   The leads were intact, stitched the battery and torqued and tightened according to manufacture standard. I then placed the battery in the cavity and secured it down to the deep fascia with interrupted #1 Vicryl sutures.  Battery was tested.  The leads were functioning without problem.  I then irrigated all the wounds copiously with normal saline.  Using bipolar electrocautery and FloSeal, I obtained and maintained hemostasis in both wounds sites.  Because of the thrombocytopenia, I did place a deep drain in the thoracic wound.  I then closed the deep fascia of both wounds with interrupted #1 Vicryl sutures, superficial with 2-0 Vicryl sutures, and a 3-0 Monocryl for the skin.  Steri-Strips and dry dressing were applied and the patient was ultimately extubated and transferred to PACU without incident.  In the PACU, he was neurologically intact, moving all 4 extremities.  There was no evidence of thoracic myelopathy (cord compression).  The patient will be admitted for closer observation overnight and most likely be discharged in the late afternoon tomorrow.     Eliyohu Class D. Rolena Infante, M.D.     DDB/MEDQ  D:  02/02/2015  T:  02/02/2015  Job:  [id number]  cc:   Duane Lope D. Rolena Infante, M.D.'s Office

## 2015-02-02 NOTE — Anesthesia Procedure Notes (Signed)
Procedure Name: Intubation Date/Time: 02/02/2015 2:48 PM Performed by: Trixie Deis A Pre-anesthesia Checklist: Patient identified, Timeout performed, Emergency Drugs available, Suction available and Patient being monitored Patient Re-evaluated:Patient Re-evaluated prior to inductionOxygen Delivery Method: Circle system utilized Preoxygenation: Pre-oxygenation with 100% oxygen Intubation Type: IV induction Ventilation: Mask ventilation without difficulty and Oral airway inserted - appropriate to patient size Laryngoscope Size: Glidescope Grade View: Grade I Tube type: Oral Tube size: 7.5 mm Number of attempts: 1 Airway Equipment and Method: Video-laryngoscopy and Rigid stylet Placement Confirmation: ETT inserted through vocal cords under direct vision,  breath sounds checked- equal and bilateral and positive ETCO2 Secured at: 23 cm Tube secured with: Tape Dental Injury: Teeth and Oropharynx as per pre-operative assessment

## 2015-02-02 NOTE — Brief Op Note (Signed)
02/02/2015  6:31 PM  PATIENT:  [name]  54 y.o. male  PRE-OPERATIVE DIAGNOSIS:  CHRONIC PAIN LEFT LEG   POST-OPERATIVE DIAGNOSIS:  CHRONIC PAIN LEFT LEG   PROCEDURE:  Procedure(s): LUMBAR SPINAL CORD STIMULATOR INSERTION (N/A)  SURGEON:  Surgeon(s) and Role:    * Melina Schools, MD - Primary  PHYSICIAN ASSISTANT:   ASSISTANTS: Carmen Mayo   ANESTHESIA:   general  EBL:  Total I/O In: 2115 [I.V.:1550; Blood:565] Out: 200 [Blood:200]  BLOOD ADMINISTERED:1 unit PLTS   DRAINS: 1 drain posterior thoracic site   LOCAL MEDICATIONS USED:  MARCAINE     SPECIMEN:  No Specimen  DISPOSITION OF SPECIMEN:  N/A  COUNTS:  YES  TOURNIQUET:  * No tourniquets in log *  DICTATION: .Other Dictation: Dictation Number [id number]  PLAN OF CARE: Admit for overnight observation  PATIENT DISPOSITION:  PACU - hemodynamically stable.

## 2015-02-02 NOTE — Progress Notes (Signed)
Pt arrived from PACU post op, alert and oriented with c/o of pain in back, pt settled in bed with call light within reach, pt reassured, will however continue to monitor. Obasogie-Asidi, Corby Villasenor Efe

## 2015-02-02 NOTE — Progress Notes (Signed)
Platelet Unit completed @ 1335. No adverse reaction.

## 2015-02-02 NOTE — H&P (Signed)
History of Present Illness  The patient is a 54 year old male who presents today for follow up of their back. The patient is being followed for their Pt has been referred by Dr Nelva Bush for Spinal Cord Stim Implantation . They are now 5 year(s) out from surgery (Dr Tonita Cong did a back surgery several years ago). Symptoms reported today include: pain (across the low back and down the left leg, posteriorly), pain at night, aching, throbbing, stiffness, catching, difficulty ambulating, difficulty arising from chair, weakness, numbness ("sometimes" left thigh posteriorly.), leg pain (left leg), pain with lying, pain with lifting, pain with sitting and pain with standing, while the patient does not report symptoms of: swelling, popping, grinding, giving way or pain with weightbearing. The patient states that they are doing poorly. Current treatment includes: bracing, activity modification and pain medications. The following medication has been used for pain control: Dilaudid (8mg  qid and Morphine 30mg  bid Rx'd by Dr Nelva Bush) and Morphine. The patient reports their current pain level to be he rates his pain right now at 6/10 / 10. The patient presents today following MRI (of the Thoracic spine). The patient indicates that they have questions or concerns today regarding.  PT WANTS MD TO BE AWARE THAT HE HAS LOW PLATELETS WITH SURGERIES.  Additional reasons for visit:  Transition into care is described as the following: The patient is transitioning into care from another physician (Dr Suella Broad has referred pt for the Permanent Implantation of a SCS) .   Allergies Flexeril *MUSCULOSKELETAL THERAPY AGENTS*  Vicodin *ANALGESICS - OPIOID*   Family History Hypertension  father Cancer  First Degree Relatives. mother Cerebrovascular Accident  Father. father  Social History  Tobacco / smoke exposure  no Tobacco use  Former smoker. Quit over 25 years ago 03-01-14 Most recent primary occupation  HEAT [place] Illicit drug use  no No alcohol use  03-01-14 Alcohol use  current drinker; drinks beer and hard liquor; less than 5 per week Pain Contract  no Number of flights of stairs before winded  2-3 Exercise  Exercises never Children  1 Living situation  live with spouse Current work status  unemployed Drug/Alcohol Rehab (Currently)  no Marital status  married Previously in rehab  no  Medication History Dilaudid (8MG  Tablet, 1 Oral four times daily, as needed, Taken starting 01/17/2015) Active. Morphine Sulfate ER (30MG  Tablet ER, 1 (one) Oral every twelve hours, Taken starting 01/17/2015) Active. Tanzeum (30MG  Pen-injector, Subcutaneous) Active. (1 q week) Pantoprazole Sodium (40MG  Tablet DR, Oral) Active. (qd) Emsam (6MG /24HR Patch 24HR, Transdermal) Active. (qd) Atorvastatin Calcium (40MG  Tablet, Oral) Active. (qd) Toujeo SoloStar (300UNIT/ML Soln Pen-inj, Subcutaneous) Active. (80 units qd) MetFORMIN HCl (1000MG  Tablet, Oral) Active. (1 bid) Xanax (2MG  Tablet, Oral) Active. (1-2 qd prn) Atenolol (25MG  Tablet, Oral) Active. (qd) Fish Oil (1000MG  Capsule, Oral) Active. (qd) Lisinopril (2.5MG  Tablet, Oral) Active. (qd) Vitamin C (1000MG  Tablet, Oral) Active. (400 units qd) Zaleplon (10MG  Capsule, Oral) Active. (1-2 qhs) Medications Reconciled  Note:NSAIDS= GI UPSET DENIES ULCER    Objective Transcription Asean is a pleasant gentleman who appears younger than his stated age. He is alert. He is oriented x3. No shortness of breath or chest pain. Abdomen is soft and nontender. Constant radicular neuropathic left leg pain. No gross instability in the lower extremity. Intact peripheral pulses. Compartments are soft and nontender. Trace weakness of the entire leg, more due to global hip pain. Pain with range of motion of lumbar spine, pain with palpation. No  loss in bowel and bladder control.  IMAGING Thoracic MRI shows multilevel mild disc disease  without canal or foraminal stenosis. Moderate sized disc protrusion on the left side at T9-10 with minimal mass effect on the ventral left hemicord. No canal or foraminal stenosis.     Assessment & Plan   At this point in time, he is status post a spinal cord stimulator trial with excellent results. He would like to have a permanent one placed. We have gone over the procedure, which is a two incision technique; one is for a thoracic laminotomy for placement of the paddle and then a second for the battery placement. We have reviewed the risks which include infection, bleeding, nerve damage, death, stroke, paralysis, failure to heal, need for further surgery, ongoing or worse pain, migration of the lead, need to replace the battery. All of his questions and his wife's questions were addressed. He does have a history of thrombocytopenia secondary to an enlarged spleen, but we will make sure his primary care physician clears him preoperatively. We will also determine whether or not we need to supplement him prior to surgery with plts.  Spoke with Hematology recommend 1 unit of plts 1 hr prior to surgery and 1 unit prior to incision.  Discussed with PCP and he is in agreement Risks of bleeding and hematoma formation requiring another operation discussed in greater detail with patient and his wife.  CC: Antony Contras, MD

## 2015-02-03 ENCOUNTER — Encounter (HOSPITAL_COMMUNITY): Payer: Self-pay | Admitting: *Deleted

## 2015-02-03 DIAGNOSIS — G8929 Other chronic pain: Secondary | ICD-10-CM | POA: Diagnosis not present

## 2015-02-03 LAB — PREPARE PLATELET PHERESIS
Unit division: 0
Unit division: 0

## 2015-02-03 LAB — TYPE AND SCREEN
ABO/RH(D): A POS
Antibody Screen: NEGATIVE

## 2015-02-03 LAB — GLUCOSE, CAPILLARY
Glucose-Capillary: 116 mg/dL — ABNORMAL HIGH (ref 65–99)
Glucose-Capillary: 138 mg/dL — ABNORMAL HIGH (ref 65–99)
Glucose-Capillary: 149 mg/dL — ABNORMAL HIGH (ref 65–99)
Glucose-Capillary: 208 mg/dL — ABNORMAL HIGH (ref 65–99)
Glucose-Capillary: 209 mg/dL — ABNORMAL HIGH (ref 65–99)
Glucose-Capillary: 253 mg/dL — ABNORMAL HIGH (ref 65–99)

## 2015-02-03 MED ORDER — HYDROMORPHONE HCL 2 MG PO TABS
8.0000 mg | ORAL_TABLET | ORAL | Status: DC | PRN
  Administered 2015-02-03 (×2): 8 mg via ORAL
  Filled 2015-02-03 (×3): qty 4

## 2015-02-03 MED FILL — Thrombin For Soln 20000 Unit: CUTANEOUS | Qty: 1 | Status: AC

## 2015-02-03 NOTE — Progress Notes (Cosign Needed)
Patient ID: Victor [name], male   DOB: 1960/09/30, 54 y.o.   MRN: [id number]    Subjective: 1 Day Post-Op Procedure(s) (LRB): LUMBAR SPINAL CORD STIMULATOR INSERTION (N/A) Patient reports pain as 8 on 0-10 scale.  Incisional pain.  Notes decreased pain in his LLE from the SCS Denies CP or SOB.  Voiding without difficulty. Positive flatus. Pt is in pain management with Morphine and Dilaudid taken daily chronically Objective: Vital signs in last 24 hours: Temp:  [97.3 F (36.3 C)-98.5 F (36.9 C)] 98.4 F (36.9 C) (12/22 0552) Pulse Rate:  [66-94] 82 (12/22 0552) Resp:  [14-21] 18 (12/22 0552) BP: (104-147)/(48-80) 137/61 mmHg (12/22 0552) SpO2:  [95 %-100 %] 98 % (12/22 0552) Weight:  [127.007 kg (280 lb)-130.437 kg (287 lb 9 oz)] 127.007 kg (280 lb) (12/21 1948)  Intake/Output from previous day: 12/21 0701 - 12/22 0700 In: 2795 [P.O.:580; I.V.:1650; Blood:565] Out: 1305 [Urine:925; Drains:180; Blood:200] Intake/Output this shift:    Labs:  Recent Labs  01/31/15 1425  HGB 14.2    Recent Labs  01/31/15 1425  WBC 4.7  RBC 4.78  HCT 41.8  PLT 56*    Recent Labs  01/31/15 1425  NA 136  K 4.1  CL 101  CO2 27  BUN 11  CREATININE 0.92  GLUCOSE 363*  CALCIUM 9.3   No results for input(s): LABPT, INR in the last 72 hours.  Physical Exam: Neurologically intact ABD soft Sensation intact distally Dorsiflexion/Plantar flexion intact Incision: scant drainage Compartment soft  Assessment/Plan: 1 Day Post-Op Procedure(s) (LRB): LUMBAR SPINAL CORD STIMULATOR INSERTION (N/A) Advance diet Up with therapy  Increased dilaudid to Q4 hrs Pt reports chronic use of Morphine but it causes itching     Pammy Vesey, Darla Lesches for Dr. Melina Schools [facility] [phone] 02/03/2015, 7:50 AM

## 2015-02-03 NOTE — Progress Notes (Signed)
    Subjective: Procedure(s) (LRB): LUMBAR SPINAL CORD STIMULATOR INSERTION (N/A) 1 Day Post-Op  Patient reports pain as 3 on 0-10 scale.  Reports decreased leg pain reports incisional back pain   Positive void Negative bowel movement Positive flatus Negative chest pain or shortness of breath  Objective: Vital signs in last 24 hours: Temp:  [97.3 F (36.3 C)-98.5 F (36.9 C)] 98.4 F (36.9 C) (12/22 0552) Pulse Rate:  [66-94] 82 (12/22 0552) Resp:  [14-21] 18 (12/22 0552) BP: (104-147)/(48-80) 137/61 mmHg (12/22 0552) SpO2:  [95 %-100 %] 98 % (12/22 0552) Weight:  [127.007 kg (280 lb)-130.437 kg (287 lb 9 oz)] 127.007 kg (280 lb) (12/21 1948)  Intake/Output from previous day: 12/21 0701 - 12/22 0700 In: 2555 [P.O.:340; I.V.:1650; Blood:565] Out: 905 [Urine:525; Drains:180; Blood:200]  Labs:  Recent Labs  01/31/15 1425  WBC 4.7  RBC 4.78  HCT 41.8  PLT 56*    Recent Labs  01/31/15 1425  NA 136  K 4.1  CL 101  CO2 27  BUN 11  CREATININE 0.92  GLUCOSE 363*  CALCIUM 9.3   No results for input(s): LABPT, INR in the last 72 hours.  Physical Exam: Neurologically intact ABD soft Intact pulses distally Dorsiflexion/Plantar flexion intact Incision: dressing C/D/I Compartment soft  Assessment/Plan: Patient stable  xrays n/a  Continue mobilization with physical therapy Continue care  Advance diet Up with therapy  Will remove drain later this AM - plan on d/c to home this afternoon.  Melina Schools, MD [place] [phone]

## 2015-02-03 NOTE — Evaluation (Signed)
Physical Therapy Evaluation Patient Details Name: Victor [name] MRN: [id number] DOB: [birth year] Today's Date: 02/03/2015   History of Present Illness  Pt adm for implant of spinal cord stimulator.  Clinical Impression  Pt admitted with above diagnosis and presents to PT with functional limitations due to deficits listed below (See PT problem list). Pt needs skilled PT to maximize independence and safety to allow discharge to home with wife.      Follow Up Recommendations Home health PT (pt to discuss with wife if he wants this)    Equipment Recommendations  None recommended by PT    Recommendations for Other Services       Precautions / Restrictions Precautions Precautions: Back Restrictions Weight Bearing Restrictions: No      Mobility  Bed Mobility Overal bed mobility: Needs Assistance Bed Mobility: Rolling;Sidelying to Sit;Sit to Sidelying Rolling: Min guard Sidelying to sit: Min assist     Sit to sidelying: Min guard General bed mobility comments: Verbal cues for technique and use of rails  Transfers Overall transfer level: Needs assistance Equipment used: Rolling walker (2 wheeled) Transfers: Sit to/from Stand Sit to Stand: Supervision         General transfer comment: Verbal cues for hand placement  Ambulation/Gait Ambulation/Gait assistance: Supervision Ambulation Distance (Feet): 200 Feet (x 2) Assistive device: Rolling walker (2 wheeled) Gait Pattern/deviations: Step-through pattern;Decreased stride length Gait velocity: decr Gait velocity interpretation: Below normal speed for age/gender General Gait Details: Slow guarded gait with several standing rest stops  Stairs Stairs: Yes Stairs assistance: Min guard Stair Management: Two rails;Step to pattern;Forwards Number of Stairs: 2    Wheelchair Mobility    Modified Rankin (Stroke Patients Only)       Balance Overall balance assessment: No apparent balance deficits (not formally  assessed)                                           Pertinent Vitals/Pain Pain Assessment: 0-10 Pain Score: 9  Pain Location: low back and lt leg Pain Descriptors / Indicators: Aching;Sore Pain Intervention(s): Limited activity within patient's tolerance;Monitored during session;Repositioned    Home Living Family/patient expects to be discharged to:: Private residence Living Arrangements: Spouse/significant other Available Help at Discharge: Family Type of Home: House Home Access: Stairs to enter     Home Layout: Two level Home Equipment: Environmental consultant - 2 wheels      Prior Function Level of Independence: Independent         Comments: limited by back and leg pain     Hand Dominance        Extremity/Trunk Assessment   Upper Extremity Assessment: Defer to OT evaluation           Lower Extremity Assessment: LLE deficits/detail   LLE Deficits / Details: limited by pain     Communication   Communication: No difficulties  Cognition Arousal/Alertness: Awake/alert Behavior During Therapy: WFL for tasks assessed/performed Overall Cognitive Status: Within Functional Limits for tasks assessed                      General Comments      Exercises        Assessment/Plan    PT Assessment Patient needs continued PT services  PT Diagnosis Difficulty walking;Acute pain   PT Problem List Decreased strength;Decreased activity tolerance;Decreased mobility;Decreased knowledge of use of DME;Pain  PT Treatment  Interventions DME instruction;Gait training;Functional mobility training;Therapeutic activities;Therapeutic exercise;Patient/family education   PT Goals (Current goals can be found in the Care Plan section) Acute Rehab PT Goals Patient Stated Goal: decr pain so he can be more active PT Goal Formulation: With patient Time For Goal Achievement: 02/10/15 Potential to Achieve Goals: Good    Frequency Min 5X/week   Barriers to discharge         Co-evaluation               End of Session   Activity Tolerance: Patient tolerated treatment well Patient left: in bed;with call bell/phone within reach      Functional Assessment Tool Used: clinical judgement Functional Limitation: Mobility: Walking and moving around Mobility: Walking and Moving Around Current Status [id number]): At least 1 percent but less than 20 percent impaired, limited or restricted Mobility: Walking and Moving Around Goal Status [phone]): 0 percent impaired, limited or restricted    Time: 0931-1008 PT Time Calculation (min) (ACUTE ONLY): 37 min   Charges:   PT Evaluation $Initial PT Evaluation Tier I: 1 Procedure PT Treatments $Gait Training: 8-22 mins   PT G Codes:   PT G-Codes **NOT FOR INPATIENT CLASS** Functional Assessment Tool Used: clinical judgement Functional Limitation: Mobility: Walking and moving around Mobility: Walking and Moving Around Current Status [id number]): At least 1 percent but less than 20 percent impaired, limited or restricted Mobility: Walking and Moving Around Goal Status [phone]): 0 percent impaired, limited or restricted    [facility] 02/03/2015, 10:17 AM [place]

## 2015-02-03 NOTE — Progress Notes (Signed)
Occupational Therapy Evaluation Patient Details Name: Victor [name] MRN: [id number] DOB: [birth year] Today's Date: 02/03/2015    History of Present Illness Pt adm for implant of spinal cord stimulator.   Clinical Impression   PTA, pt required assistance from wife for LB ADLs and used a cane or RW for mobility. Pt currently presents with acute back and LLE pain and required min guard-min assist for mobility and ADLs. Pt's wife said she is unable to provide level of assistance at pt's current pain and functional level. Pt's wife is very concerned about MD planning to d/c pt this afternoon - as she was told that pt would not be d/c until tomorrow (02/04/15). Pt will benefit from continued acute skilled OT to increase independence and safety with ADLs and mobility to allow for safe discharge home and decrease caregiver burden. OT will follow acutely to address OT needs and goals.    Follow Up Recommendations  No OT follow up;Supervision/Assistance - 24 hour    Equipment Recommendations  3 in 1 bedside comode    Recommendations for Other Services       Precautions / Restrictions Precautions Precautions: Back Precaution Booklet Issued: No Precaution Comments: Reviewed back precautions. Pt able to recall 2/3 back precautions at end of session. Restrictions Weight Bearing Restrictions: No      Mobility Bed Mobility Overal bed mobility: Needs Assistance Bed Mobility: Rolling;Sit to Sidelying Rolling: Min guard Sidelying to sit: Min assist     Sit to sidelying: Min guard;HOB elevated General bed mobility comments: HOB elevated due to severe back pain. Verbal cues for log roll technique and safe hand placement to avoid twisting  Transfers Overall transfer level: Needs assistance Equipment used: Rolling walker (2 wheeled) Transfers: Sit to/from Stand Sit to Stand: Min guard         General transfer comment: Verbal cues for safe hand placement and to avoid leaving RW to side  during ADL tasks. Verbal cues to avoid looking back/twisting to look at surfaces before sitting.    Balance Overall balance assessment: Needs assistance Sitting-balance support: No upper extremity supported;Feet supported Sitting balance-Leahy Scale: Fair     Standing balance support: No upper extremity supported;During functional activity Standing balance-Leahy Scale: Fair Standing balance comment: Able to complete grooming tasks at sink without UE support for ~5 minutes with no LOB                            ADL Overall ADL's : Needs assistance/impaired     Grooming: Wash/dry hands;Oral care;Min guard;Cueing for compensatory techniques;Standing               Lower Body Dressing: Maximal assistance;Adhering to back precautions;Sitting/lateral leans Lower Body Dressing Details (indicate cue type and reason): To don/doff socks only Toilet Transfer: Min guard;Ambulation;Regular Toilet;RW   Toileting- Clothing Manipulation and Hygiene: Min guard;Cueing for back precautions;Sit to/from stand       Functional mobility during ADLs: Min guard;Rolling walker General ADL Comments: Min guard assist for mobility during ADLs due to severe back pain and unsteadiness as a result. Began education on AE and compensatory strategies - pt unable to cross ankle-over-knee. Reviewed back precautions, verbal cues to avoid twisting to look back at surfaces before sitting and to use RW safely.     Vision Vision Assessment?: No apparent visual deficits   Perception     Praxis      Pertinent Vitals/Pain Pain Assessment: 0-10 Pain Score: 10-Worst pain  ever Pain Location: Low back and L leg Pain Descriptors / Indicators: Pressure;Operative site guarding;Aching Pain Intervention(s): Limited activity within patient's tolerance;Monitored during session;Premedicated before session;Repositioned     Hand Dominance Right   Extremity/Trunk Assessment Upper Extremity Assessment Upper  Extremity Assessment: RUE deficits/detail RUE Deficits / Details: Decreased grip strength and fine motor coordination due to 2 previous hand surgeries RUE Coordination: decreased fine motor   Lower Extremity Assessment Lower Extremity Assessment: Defer to PT evaluation LLE Deficits / Details: limited by pain   Cervical / Trunk Assessment Cervical / Trunk Assessment: Normal   Communication Communication Communication: No difficulties   Cognition Arousal/Alertness: Awake/alert Behavior During Therapy: WFL for tasks assessed/performed Overall Cognitive Status: Within Functional Limits for tasks assessed       Memory: Decreased recall of precautions             General Comments       Exercises       Shoulder Instructions      Home Living Family/patient expects to be discharged to:: Private residence Living Arrangements: Spouse/significant other Available Help at Discharge: Family;Available 24 hours/day Type of Home: House Home Access: Stairs to enter [employer] of Steps: 6 Entrance Stairs-Rails: Right;Left;Can reach both Home Layout: Two level     Bathroom Shower/Tub: Walk-in shower;Door   [employer] Toilet: Standard Bathroom Accessibility: Yes   Home Equipment: Environmental consultant - 2 wheels;Cane - single point          Prior Functioning/Environment Level of Independence: Needs assistance  Gait / Transfers Assistance Needed: Ambulates with walking stick or RW ADL's / Homemaking Assistance Needed: Wife assists routinely with LB bathing and dressing and to help stabilize pt upon standing   Comments: limited by back and leg pain    OT Diagnosis: Acute pain   OT Problem List: Decreased strength;Decreased range of motion;Decreased activity tolerance;Impaired balance (sitting and/or standing);Decreased coordination;Decreased safety awareness;Decreased knowledge of use of DME or AE;Decreased knowledge of precautions;Obesity;Pain   OT Treatment/Interventions:  Self-care/ADL training;Therapeutic exercise;DME and/or AE instruction;Therapeutic activities;Patient/family education;Balance training    OT Goals(Current goals can be found in the care plan section) Acute Rehab OT Goals Patient Stated Goal: decr pain so he can be more active OT Goal Formulation: With patient Time For Goal Achievement: 02/17/15 Potential to Achieve Goals: Good ADL Goals Pt Will Perform Lower Body Bathing: with supervision;with adaptive equipment;sitting/lateral leans;sit to/from stand Pt Will Perform Lower Body Dressing: with supervision;sitting/lateral leans;with adaptive equipment;sit to/from stand Pt Will Transfer to Toilet: with modified independence;ambulating;grab bars;regular height toilet Pt Will Perform Toileting - Clothing Manipulation and hygiene: with supervision;with adaptive equipment;sitting/lateral leans;sit to/from stand Pt Will Perform Tub/Shower Transfer: Shower transfer;with supervision;ambulating;3 in 1;rolling walker Additional ADL Goal #1: Pt will demonstrate adherence to 3/3 back precautions to increase safety with ADLs.  OT Frequency: Min 2X/week   Barriers to D/C:            Co-evaluation              End of Session Equipment Utilized During Treatment: Gait belt;Rolling walker Nurse Communication: Mobility status;Precautions  Activity Tolerance: Patient limited by pain Patient left: in bed;with call bell/phone within reach;with bed alarm set;with family/visitor present   Time: 1139-1209 OT Time Calculation (min): 30 min Charges:  OT General Charges $OT Visit: 1 Procedure OT Evaluation $Initial OT Evaluation Tier I: 1 Procedure OT Treatments $Self Care/Home Management : 8-22 mins G-Codes: OT G-codes **NOT FOR INPATIENT CLASS** Functional Assessment Tool Used: clinical judgement Functional Limitation: Self care Self Care  Current Status [phone]): At least 1 percent but less than 20 percent impaired, limited or restricted Self Care  Goal Status [id number]): At least 1 percent but less than 20 percent impaired, limited or restricted  Redmond Baseman, OTR/L 02/03/2015, 1:02 PM  Pager: [phone]

## 2015-02-03 NOTE — Clinical Social Work Note (Signed)
CSW received referral for SNF.  Case discussed with case manager and plan is to discharge home.  CSW to sign off please re-consult if social work needs arise.  Leray Garverick R. Collie Kittel, MSW, LCSWA [phone]  

## 2015-02-03 NOTE — Anesthesia Postprocedure Evaluation (Signed)
Anesthesia Post Note  Patient: Victor [name]  Procedure(s) Performed: Procedure(s) (LRB): LUMBAR SPINAL CORD STIMULATOR INSERTION (N/A)  Patient location during evaluation: PACU Anesthesia Type: General Level of consciousness: awake and alert Pain management: pain level controlled Vital Signs Assessment: post-procedure vital signs reviewed and stable Respiratory status: spontaneous breathing, nonlabored ventilation, respiratory function stable and patient connected to nasal cannula oxygen Cardiovascular status: blood pressure returned to baseline and stable Postop Assessment: no signs of nausea or vomiting Anesthetic complications: no    Last Vitals:  Filed Vitals:   02/03/15 0403 02/03/15 0552  BP: 128/71 137/61  Pulse: 94 82  Temp: 36.9 C 36.9 C  Resp: 18 18    Last Pain:  Filed Vitals:   02/03/15 0553  PainSc: 5                  Catalina Gravel

## 2015-02-03 NOTE — Progress Notes (Signed)
Discussed patient's plan for discharge with PA [facility]. Informed PA of notes from PT and OT about patient's mobility and safety. PA acknowledged notes and informed RN to keep patient on unit tonight and plan for discharge tomorrow.

## 2015-02-04 DIAGNOSIS — G8929 Other chronic pain: Secondary | ICD-10-CM | POA: Diagnosis not present

## 2015-02-04 LAB — GLUCOSE, CAPILLARY
Glucose-Capillary: 146 mg/dL — ABNORMAL HIGH (ref 65–99)
Glucose-Capillary: 150 mg/dL — ABNORMAL HIGH (ref 65–99)
Glucose-Capillary: 159 mg/dL — ABNORMAL HIGH (ref 65–99)
Glucose-Capillary: 174 mg/dL — ABNORMAL HIGH (ref 65–99)
Glucose-Capillary: 181 mg/dL — ABNORMAL HIGH (ref 65–99)
Glucose-Capillary: 195 mg/dL — ABNORMAL HIGH (ref 65–99)

## 2015-02-04 MED ORDER — HYDROMORPHONE HCL 2 MG PO TABS
4.0000 mg | ORAL_TABLET | ORAL | Status: DC | PRN
  Administered 2015-02-05: 4 mg via ORAL
  Filled 2015-02-04: qty 2

## 2015-02-04 MED ORDER — TAMSULOSIN HCL 0.4 MG PO CAPS
0.4000 mg | ORAL_CAPSULE | Freq: Every day | ORAL | Status: DC
  Administered 2015-02-04: 0.4 mg via ORAL
  Filled 2015-02-04: qty 1

## 2015-02-04 MED ORDER — MORPHINE SULFATE ER 15 MG PO TBCR
15.0000 mg | EXTENDED_RELEASE_TABLET | Freq: Two times a day (BID) | ORAL | Status: DC
  Administered 2015-02-05: 15 mg via ORAL
  Filled 2015-02-04: qty 1

## 2015-02-04 MED ORDER — ALPRAZOLAM 0.5 MG PO TABS
1.0000 mg | ORAL_TABLET | Freq: Three times a day (TID) | ORAL | Status: DC | PRN
  Administered 2015-02-04: 1 mg via ORAL
  Filled 2015-02-04: qty 2

## 2015-02-04 NOTE — Progress Notes (Signed)
Occupational Therapy Treatment Patient Details Name: Victor [name] MRN: [id number] DOB: 09-May-1960 Today's Date: 02/04/2015    History of present illness Pt adm for implant of spinal cord stimulator.   OT comments  Focus of session on ADL transfers, education in use of AE for LB bathing and dressing.  Pt requiring supervision to min guard assist for transfers.  Pt prefers to rely on his wife as he has been instead of using adaptive equipment.  Follow Up Recommendations  No OT follow up;Supervision/Assistance - 24 hour    Equipment Recommendations  3 in 1 bedside comode    Recommendations for Other Services      Precautions / Restrictions Precautions Precautions: Back;Fall Precaution Comments: reinforced back safety related to ADL       Mobility Bed Mobility Overal bed mobility: Needs Assistance Bed Mobility: Rolling;Sit to Sidelying Rolling: Min guard Sidelying to sit: Min guard       General bed mobility comments: HOB flat, no rail, cues for log roll technique  Transfers Overall transfer level: Needs assistance Equipment used: Rolling walker (2 wheeled) Transfers: Sit to/from Stand Sit to Stand: Supervision         General transfer comment: stood from elevated bed and 3in1 over toilet with supervision, pt uses one hand on walker and one on sitting surface     Balance             Standing balance-Leahy Scale: Fair Standing balance comment: able to release walker to pull up pants from knees and at sink for grooming                   ADL Overall ADL's : Needs assistance/impaired     Grooming: Wash/dry hands;Wash/dry face;Standing;Supervision/safety         Lower Body Bathing Details (indicate cue type and reason): instructed pt in use of long handled bath sponge     Lower Body Dressing: Maximal assistance;Sit to/from stand Lower Body Dressing Details (indicate cue type and reason): Educated pt in use of sock aide, reacher and long shoe  horn. Toilet Transfer: Supervision/safety;Ambulation;RW;BSC Toilet Transfer Details (indicate cue type and reason): BSC over toilet Toileting- Clothing Manipulation and Hygiene: Minimal assistance;Sit to/from stand Toileting - Clothing Manipulation Details (indicate cue type and reason): assist to pull up pants which had fallen to floor Tub/ Shower Transfer: Min guard;Ambulation;3 in 1;Rolling walker   Functional mobility during ADLs: Supervision/safety;Rolling walker General ADL Comments: Pt prefers to continue to rely on wife for LB ADL, not interested in AE.      Vision                     Perception     Praxis      Cognition   Behavior During Therapy: Anxious (RN gave anxiety medication) Overall Cognitive Status: Within Functional Limits for tasks assessed                       Extremity/Trunk Assessment               Exercises     Shoulder Instructions       General Comments      Pertinent Vitals/ Pain       Pain Assessment: Faces Faces Pain Scale: Hurts little more Pain Location: back Pain Descriptors / Indicators: Guarding;Grimacing;Sore Pain Intervention(s): Limited activity within patient's tolerance;Monitored during session;Repositioned;RN gave pain meds during session  Home Living  Prior Functioning/Environment              Frequency Min 2X/week     Progress Toward Goals  OT Goals(current goals can now be found in the care plan section)  Progress towards OT goals: Progressing toward goals     Plan Discharge plan remains appropriate    Co-evaluation                 End of Session Equipment Utilized During Treatment: Gait belt;Rolling walker   Activity Tolerance Patient tolerated treatment well   Patient Left in chair;with call bell/phone within reach;with nursing/sitter in room   Nurse Communication  (requesting anxiety meds)        Time:  [id number] OT Time Calculation (min): 37 min  Charges: OT General Charges $OT Visit: 1 Procedure OT Treatments $Self Care/Home Management : 23-37 mins  Malka So 02/04/2015, 11:48 AM  [phone]

## 2015-02-04 NOTE — Progress Notes (Signed)
Cpap in place from respiratory therapy for night use; applied due to patient snoring; no prn medications administered this shift; patient has easily fallen back to sleep threw out the day; MD adjusted his scheduled medications; patient will awaken to stimulation and stay awake to interact with tasks.

## 2015-02-04 NOTE — Progress Notes (Addendum)
    Subjective: Procedure(s) (LRB): LUMBAR SPINAL CORD STIMULATOR INSERTION (N/A) 2 Days Post-Op  Patient reports pain as 2 on 0-10 scale.  Reports decreased leg pain reports incisional back pain   Positive void - issue of retention noted.   Negative bowel movement Positive flatus Negative chest pain or shortness of breath  Objective: Vital signs in last 24 hours: Temp:  [97.3 F (36.3 C)-99.2 F (37.3 C)] 99.2 F (37.3 C) (12/23 0533) Pulse Rate:  [87-104] 104 (12/23 0100) Resp:  [18-20] 18 (12/23 0533) BP: (122-147)/(57-79) 127/70 mmHg (12/23 0533) SpO2:  [86 %-96 %] 95 % (12/23 0533)  Intake/Output from previous day: 12/22 0701 - 12/23 0700 In: -  Out: 1025 [Urine:1025]  Labs: No results for input(s): WBC, RBC, HCT, PLT in the last 72 hours. No results for input(s): NA, K, CL, CO2, BUN, CREATININE, GLUCOSE, CALCIUM in the last 72 hours. No results for input(s): LABPT, INR in the last 72 hours.  Physical Exam: Neurologically intact ABD soft Intact pulses distally Incision: dressing C/D/I Compartment soft Rectal tone nl Perianal sensation intact to LT/pin prick   Assessment/Plan: Patient stable  xrays n/a Continue mobilization with physical therapy Continue care  Advance diet Up with therapy  Plan on d/c today if able to void   Melina Schools, MD [place] [phone]

## 2015-02-04 NOTE — Progress Notes (Addendum)
Patient has made repeated attempts to void since last in and out cath. At 0600 hour this morning; bladder scan is greater then 631ml presently; rectal sensation remains intact and rectal tone intact. In and out cath. Using sterile technique obtained 800 ml of residual urine.  Patient is drowsy; snoring in bed at rest between attempts to get OOB to void; without O2 at 2 liters his O2 Saturations drop below 90%; wife reports always has trouble post op and has trouble voiding in the past after a surgery.  Does not use O2 via tank at home; just cpap at night.  Paging on call MD for orders; given inability to void and wife concerns of taking him home.  He isn't mobilizing independently needs assist of 2 staff members to stand at bedside.

## 2015-02-04 NOTE — Progress Notes (Signed)
Physical Therapy Treatment Patient Details Name: Victor [name] MRN: [id number] DOB: 1960/06/10 Today's Date: 02/09/15    History of Present Illness Pt adm for implant of spinal cord stimulator.    PT Comments    Pt progressing with mobility. Expect pt will continue to make steady progress at home.  Follow Up Recommendations  Home health PT     Equipment Recommendations  None recommended by PT    Recommendations for Other Services       Precautions / Restrictions Precautions Precautions: Back;Fall Precaution Comments: reinforced back safety related to ADL    Mobility  Bed Mobility Overal bed mobility: Needs Assistance Bed Mobility: Rolling;Sit to Sidelying Rolling: Min guard Sidelying to sit: Min guard       General bed mobility comments: HOB flat, no rail, cues for log roll technique  Transfers Overall transfer level: Needs assistance Equipment used: Rolling walker (2 wheeled) Transfers: Sit to/from Stand Sit to Stand: Supervision         General transfer comment: Verbal cues for hand placement. Elevated be to height of his be at home.  Ambulation/Gait Ambulation/Gait assistance: Supervision Ambulation Distance (Feet): 200 Feet Assistive device: Rolling walker (2 wheeled) Gait Pattern/deviations: Step-through pattern;Decreased stride length Gait velocity: decr Gait velocity interpretation: Below normal speed for age/gender General Gait Details: slow, guarded gait   Stairs            Wheelchair Mobility    Modified Rankin (Stroke Patients Only)       Balance Overall balance assessment: Needs assistance Sitting-balance support: No upper extremity supported;Feet supported Sitting balance-Leahy Scale: Fair     Standing balance support: No upper extremity supported Standing balance-Leahy Scale: Fair Standing balance comment: able to release walker                    Cognition Arousal/Alertness: Awake/alert Behavior During  Therapy: Anxious (RN gave anxiety medication) Overall Cognitive Status: Within Functional Limits for tasks assessed                      Exercises      General Comments        Pertinent Vitals/Pain Pain Assessment: Faces Pain Score: 2  Faces Pain Scale: Hurts little more Pain Location: back Pain Descriptors / Indicators: Grimacing;Guarding Pain Intervention(s): Limited activity within patient's tolerance;Monitored during session;Repositioned;RN gave pain meds during session    Home Living                      Prior Function            PT Goals (current goals can now be found in the care plan section) Progress towards PT goals: Progressing toward goals    Frequency  Min 5X/week    PT Plan Current plan remains appropriate    Co-evaluation             End of Session Equipment Utilized During Treatment: Gait belt Activity Tolerance: Patient tolerated treatment well Patient left: Other (comment) (With OT)     [id number] PT Time Calculation (min) (ACUTE ONLY): 10 min  Charges:  $Gait Training: 8-22 mins                    G Codes:      Keyauna Graefe 02-09-15, 1:17 PM [employer] PT [phone]

## 2015-02-04 NOTE — Consult Note (Signed)
Urology Consult  Referring physician:   Dr. Melina Schools Reason for referral:   Recurrent urinary retention  Chief Complaint:  Urinary retention  History of Present Illness:   Mr. Goveia is a 54 yo obese male, 2 days post placement of spinal cord stimulator for chronic neuropathic leg pain. He has developed recurrent post operative urinary retention, which his wife notes has happened after other surgeries. Currently managed with I/O cath on floor. Rectal exam x 2 shows Normal sphincter tone.Medications are being decreased as fast as possible.     Note : 1. Chronic Sleep Apnea              2. Chronic thrombocytopenia              3. chronic opioid pain management              4. DM  Past Medical History  Diagnosis Date  . Hepatitis B virus infection 03/2007  . Thrombocytopenia ([place])   . Diabetes mellitus     Type II  . Hyperlipidemia   . GERD (gastroesophageal reflux disease)   . Osteoarthritis   . Depression   . Recurrent upper respiratory infection (URI)     head cold last week- no fever- states resolved  . Blood transfusion     platelets  . Chronic kidney disease     kidney stones 2012  . Hepatitis     hepatitis b/ newly diagnosed with portal hypertension  . Splenomegaly     LOV Dr Lamonte Sakai  12/12 on chart  . Hypertension   . Anxiety     h/o panic attacks   . Obstructive sleep apnea (adult) (pediatric)     mild with AHI 11.61/hr now on CPAP at 15cm h2o, CPAP is used q night   . Sciatica     lumbar region - 2010, also has had numerous injections   . Blood dyscrasia     thrombocytopenia    Past Surgical History  Procedure Laterality Date  . Hand surgery      right hand  . Shoulder surgery      left shoulder  . Knee surgery      5 surgeries to right knee, 2 surgeries to left knee  . Lumbar laminectomy/decompression microdiscectomy  02/19/2011    Procedure: LUMBAR LAMINECTOMY/DECOMPRESSION MICRODISCECTOMY;  Surgeon: Johnn Hai;  Location: [facility];  Service: Orthopedics;   Laterality: Left;  decompression l5-s1 l4-5 on left  . Kyphoplasty    . Spinal cord stimulator insertion N/A 02/02/2015    Procedure: LUMBAR SPINAL CORD STIMULATOR INSERTION;  Surgeon: Melina Schools, MD;  Location: [facility];  Service: Orthopedics;  Laterality: N/A;    Medications: I have reviewed the patient's current medications. Allergies:  Allergies  Allergen Reactions  . Cyclobenzaprine Anaphylaxis    REACTION:  Not compatible with Emsam patch.  . Canagliflozin Itching and Other (See Comments)    Yeast infections   . Meperidine Rash    REACTION: not compatible with Emsam patch.  . Vicodin [Hydrocodone-Acetaminophen] Hives and Other (See Comments)    REACTION: pt turns red and has hot flashes   . Nsaids Diarrhea    Stomach Upset     Family History  Problem Relation Age of Onset  . Cancer Mother    Social History:  reports that he quit smoking about 29 years ago. He does not have any smokeless tobacco history on file. He reports that he does not drink alcohol or use illicit drugs.  ROS: All systems are reviewed and negative except as noted.   Physical Exam:  General: Pt is stuporous. Unable to answer questions. Unknown BPH history.  Vital signs in last 24 hours: Temp:  [97.3 F (36.3 C)-99.2 F (37.3 C)] 97.6 F (36.4 C) (12/23 1421) Pulse Rate:  [86-104] 86 (12/23 1421) Resp:  [18-20] 20 (12/23 1421) BP: (108-141)/(57-71) 108/71 mmHg (12/23 1421) SpO2:  [85 %-95 %] 93 % (12/23 1757)  Cardiovascular: Skin warm; not flushed  Respiratory: Breaths quiet; no shortness of breath Abdomen: No masses Neurological: Normal sensation to touch Musculoskeletal: Normal motor function arms and legs Lymphatics: No inguinal adenopathy Skin: No rashes Genitourinary: Normal penis. Norma scrotum.   Laboratory Data:  Results for orders placed or performed during the hospital encounter of 02/02/15 (from the past 72 hour(s))  Glucose, capillary     Status: Abnormal   Collection Time:  02/02/15 11:20 AM  Result Value Ref Range   Glucose-Capillary 172 (H) 65 - 99 mg/dL  Prepare platelet pheresis     Status: None   Collection Time: 02/02/15 12:11 PM  Result Value Ref Range   Unit Number [id number]    Blood Component Type PLTP LR1 PAS    Unit division 00    Status of Unit ISSUED,FINAL    Transfusion Status OK TO TRANSFUSE    Unit Number [id number]    Blood Component Type PLTPHER LR1    Unit division 00    Status of Unit ISSUED,FINAL    Transfusion Status OK TO TRANSFUSE   Glucose, capillary     Status: Abnormal   Collection Time: 02/02/15  1:52 PM  Result Value Ref Range   Glucose-Capillary 139 (H) 65 - 99 mg/dL  Glucose, capillary     Status: Abnormal   Collection Time: 02/02/15  6:18 PM  Result Value Ref Range   Glucose-Capillary 119 (H) 65 - 99 mg/dL   Comment 1 Notify RN   Glucose, capillary     Status: Abnormal   Collection Time: 02/02/15  8:43 PM  Result Value Ref Range   Glucose-Capillary 118 (H) 65 - 99 mg/dL   Comment 1 Notify RN    Comment 2 Document in Chart   Glucose, capillary     Status: Abnormal   Collection Time: 02/02/15 11:57 PM  Result Value Ref Range   Glucose-Capillary 116 (H) 65 - 99 mg/dL  Glucose, capillary     Status: Abnormal   Collection Time: 02/03/15  4:01 AM  Result Value Ref Range   Glucose-Capillary 138 (H) 65 - 99 mg/dL  Glucose, capillary     Status: Abnormal   Collection Time: 02/03/15 11:40 AM  Result Value Ref Range   Glucose-Capillary 253 (H) 65 - 99 mg/dL  Glucose, capillary     Status: Abnormal   Collection Time: 02/03/15  1:21 PM  Result Value Ref Range   Glucose-Capillary 209 (H) 65 - 99 mg/dL  Glucose, capillary     Status: Abnormal   Collection Time: 02/03/15  4:15 PM  Result Value Ref Range   Glucose-Capillary 208 (H) 65 - 99 mg/dL  Glucose, capillary     Status: Abnormal   Collection Time: 02/03/15  8:46 PM  Result Value Ref Range   Glucose-Capillary 149 (H) 65 - 99 mg/dL   Comment 1 Notify RN     Comment 2 Document in Chart   Glucose, capillary     Status: Abnormal   Collection Time: 02/04/15 12:10 AM  Result Value Ref Range  Glucose-Capillary 150 (H) 65 - 99 mg/dL   Comment 1 Notify RN    Comment 2 Document in Chart   Glucose, capillary     Status: Abnormal   Collection Time: 02/04/15  4:00 AM  Result Value Ref Range   Glucose-Capillary 146 (H) 65 - 99 mg/dL   Comment 1 Notify RN    Comment 2 Document in Chart   Glucose, capillary     Status: Abnormal   Collection Time: 02/04/15 11:51 AM  Result Value Ref Range   Glucose-Capillary 159 (H) 65 - 99 mg/dL   Comment 1 Notify RN    Comment 2 Document in Chart   Glucose, capillary     Status: Abnormal   Collection Time: 02/04/15  4:52 PM  Result Value Ref Range   Glucose-Capillary 195 (H) 65 - 99 mg/dL   Comment 1 Notify RN    Comment 2 Document in Chart    Recent Results (from the past 240 hour(s))  Surgical pcr screen     Status: None   Collection Time: 01/31/15  2:24 PM  Result Value Ref Range Status   MRSA, PCR NEGATIVE NEGATIVE Final   Staphylococcus aureus NEGATIVE NEGATIVE Final    Comment:        The Xpert SA Assay (FDA approved for NASAL specimens in patients over 89 years of age), is one component of a comprehensive surveillance program.  Test performance has been validated by [facility] for patients greater than or equal to 41 year old. It is not intended to diagnose infection nor to guide or monitor treatment.    Creatinine:  Recent Labs  01/31/15 1425  CREATININE 0.92    Xrays:   Impression/Assessment:   chronic difficulty voiding post operatively, which his wife reports is a recurrent temporary event. He is doing well on I/o cath, but is not going to be a candidate for home I/o cath-not teachable. He needs to be started on tamsulosin: will take 7 days to build up dose, and then call office @ [phone] for f/u appointment for voiding trial. He may be discharged with foley catheter  inserted and catheter training.   Plan:    Tamsulosin 0.4mg  po hs.   Hillman Attig I Annmargaret Decaprio 02/04/2015, 6:19 PM

## 2015-02-04 NOTE — Progress Notes (Signed)
Pt still unable to void, bladder scanned to the volume of greater than 398, but 448ml of tea colored urine was drained out, pt made comfortable in bed, will continue to monitor. Obasogie-Asidi, Bauer Ausborn Efe

## 2015-02-05 ENCOUNTER — Encounter (HOSPITAL_COMMUNITY): Payer: Self-pay | Admitting: *Deleted

## 2015-02-05 ENCOUNTER — Emergency Department (HOSPITAL_COMMUNITY)
Admission: EM | Admit: 2015-02-05 | Discharge: 2015-02-06 | Disposition: A | Discharge status: Home / Self Care | Payer: BLUE CROSS/BLUE SHIELD | Attending: Emergency Medicine | Admitting: Emergency Medicine

## 2015-02-05 DIAGNOSIS — Z862 Personal history of diseases of the blood and blood-forming organs and certain disorders involving the immune mechanism: Secondary | ICD-10-CM | POA: Insufficient documentation

## 2015-02-05 DIAGNOSIS — E119 Type 2 diabetes mellitus without complications: Secondary | ICD-10-CM | POA: Insufficient documentation

## 2015-02-05 DIAGNOSIS — Z8619 Personal history of other infectious and parasitic diseases: Secondary | ICD-10-CM | POA: Insufficient documentation

## 2015-02-05 DIAGNOSIS — Z960 Presence of urogenital implants: Secondary | ICD-10-CM | POA: Insufficient documentation

## 2015-02-05 DIAGNOSIS — Z87891 Personal history of nicotine dependence: Secondary | ICD-10-CM | POA: Diagnosis not present

## 2015-02-05 DIAGNOSIS — Z87442 Personal history of urinary calculi: Secondary | ICD-10-CM | POA: Insufficient documentation

## 2015-02-05 DIAGNOSIS — G8929 Other chronic pain: Secondary | ICD-10-CM | POA: Diagnosis not present

## 2015-02-05 DIAGNOSIS — F419 Anxiety disorder, unspecified: Secondary | ICD-10-CM | POA: Insufficient documentation

## 2015-02-05 DIAGNOSIS — M545 Low back pain: Secondary | ICD-10-CM | POA: Diagnosis not present

## 2015-02-05 DIAGNOSIS — E785 Hyperlipidemia, unspecified: Secondary | ICD-10-CM | POA: Insufficient documentation

## 2015-02-05 DIAGNOSIS — Z792 Long term (current) use of antibiotics: Secondary | ICD-10-CM | POA: Diagnosis not present

## 2015-02-05 DIAGNOSIS — I129 Hypertensive chronic kidney disease with stage 1 through stage 4 chronic kidney disease, or unspecified chronic kidney disease: Secondary | ICD-10-CM | POA: Diagnosis not present

## 2015-02-05 DIAGNOSIS — M546 Pain in thoracic spine: Secondary | ICD-10-CM | POA: Diagnosis not present

## 2015-02-05 DIAGNOSIS — Z9981 Dependence on supplemental oxygen: Secondary | ICD-10-CM | POA: Insufficient documentation

## 2015-02-05 DIAGNOSIS — K219 Gastro-esophageal reflux disease without esophagitis: Secondary | ICD-10-CM | POA: Diagnosis not present

## 2015-02-05 DIAGNOSIS — G4733 Obstructive sleep apnea (adult) (pediatric): Secondary | ICD-10-CM | POA: Diagnosis not present

## 2015-02-05 DIAGNOSIS — F329 Major depressive disorder, single episode, unspecified: Secondary | ICD-10-CM | POA: Insufficient documentation

## 2015-02-05 DIAGNOSIS — M549 Dorsalgia, unspecified: Secondary | ICD-10-CM

## 2015-02-05 DIAGNOSIS — N189 Chronic kidney disease, unspecified: Secondary | ICD-10-CM | POA: Diagnosis not present

## 2015-02-05 DIAGNOSIS — Z79899 Other long term (current) drug therapy: Secondary | ICD-10-CM | POA: Diagnosis not present

## 2015-02-05 DIAGNOSIS — Z8709 Personal history of other diseases of the respiratory system: Secondary | ICD-10-CM | POA: Insufficient documentation

## 2015-02-05 DIAGNOSIS — Z7984 Long term (current) use of oral hypoglycemic drugs: Secondary | ICD-10-CM | POA: Diagnosis not present

## 2015-02-05 LAB — GLUCOSE, CAPILLARY
Glucose-Capillary: 136 mg/dL — ABNORMAL HIGH (ref 65–99)
Glucose-Capillary: 164 mg/dL — ABNORMAL HIGH (ref 65–99)
Glucose-Capillary: 181 mg/dL — ABNORMAL HIGH (ref 65–99)
Glucose-Capillary: 192 mg/dL — ABNORMAL HIGH (ref 65–99)

## 2015-02-05 MED ORDER — ONDANSETRON HCL 4 MG/2ML IJ SOLN
4.0000 mg | Freq: Once | INTRAMUSCULAR | Status: AC
  Administered 2015-02-06: 4 mg via INTRAVENOUS
  Filled 2015-02-05: qty 2

## 2015-02-05 MED ORDER — TAMSULOSIN HCL 0.4 MG PO CAPS: 0.4000 mg | ORAL_CAPSULE | Freq: Every day | ORAL | Status: DC

## 2015-02-05 MED ORDER — HYDROMORPHONE HCL 1 MG/ML IJ SOLN
1.0000 mg | Freq: Once | INTRAMUSCULAR | Status: AC
  Administered 2015-02-06: 1 mg via INTRAVENOUS
  Filled 2015-02-05: qty 1

## 2015-02-05 NOTE — ED Notes (Signed)
Pt to ED from home via [facility] c/o worsening back pain. Pt was discharged today after having back surgery with spinal stimulator placement on Wednesday. Pt c/o pain at foley site as well; dark urine noted in foley bag. EMS gave 180mcg fentanyl, pt sleeping on stretcher at this time.

## 2015-02-05 NOTE — Progress Notes (Signed)
    Subjective: Procedure(s) (LRB): LUMBAR SPINAL CORD STIMULATOR INSERTION (N/A) 3 Days Post-Op  Patient reports pain as 2 on 0-10 scale.  Reports decreased leg pain reports incisional back pain   Urology saw patient - plan to d/c with foley Negative bowel movement Positive flatus Negative chest pain or shortness of breath  Objective: Vital signs in last 24 hours: Temp:  [97.5 F (36.4 C)-98.5 F (36.9 C)] 98.3 F (36.8 C) (12/24 0555) Pulse Rate:  [82-99] 82 (12/24 0555) Resp:  [18-20] 18 (12/24 0555) BP: (108-142)/(64-85) 128/76 mmHg (12/24 0555) SpO2:  [85 %-93 %] 92 % (12/24 0555)  Intake/Output from previous day: 12/23 0701 - 12/24 0700 In: -  Out: 1800 [Urine:1800]  Labs: No results for input(s): WBC, RBC, HCT, PLT in the last 72 hours. No results for input(s): NA, K, CL, CO2, BUN, CREATININE, GLUCOSE, CALCIUM in the last 72 hours. No results for input(s): LABPT, INR in the last 72 hours.  Physical Exam: Neurologically intact ABD soft Intact pulses distally Incision: dressing C/D/I Compartment soft Perianal sensation intact to LT   Assessment/Plan: Patient stable  xrays n/a Continue mobilization with physical therapy Continue care  Advance diet Up with therapy  Bowel regimen Plan on d/c today with foley per urology

## 2015-02-05 NOTE — ED Provider Notes (Signed)
By signing my name below, I, Altamease Oiler, attest that this documentation has been prepared under the direction and in the presence of [place], DO. Electronically Signed: Altamease Oiler, ED Scribe. 02/05/2015. 11:40 PM.  TIME SEEN: 11:20 PM  CHIEF COMPLAINT: Back pain.   HPI: Victor [name] is a 54 y.o. male with history of sciatica and osteoarthritis who presents to the Emergency Department complaining of ongoing, 6/10 in severity, mid lower back pain since having a nerve stimulator on 02/02/15 by Dr. Rolena Infante with [facility]. Pt states that he has not taken the MS Contin and Dilaudid that he was prescribed because he is not sure how or when to take it. Pt denies new numbness or tingling, bowel incontinence, fever, vomiting, and diarrhea. He has a urinary catheter in place.  States he is having some discomfort from the catheter as well.  States this is the same pain that he was discharged with this morning. They did not contact the physician on call for [facility] orthopedics prior to coming to the hospital.  ROS: See HPI Constitutional: no fever  Eyes: no drainage  ENT: no runny nose   Cardiovascular:  no chest pain  Resp: no SOB  GI: no vomiting GU: no dysuria Integumentary: no rash  Allergy: no hives  Musculoskeletal: no leg swelling  Neurological: no slurred speech ROS otherwise negative  PAST MEDICAL HISTORY/PAST SURGICAL HISTORY:  Past Medical History  Diagnosis Date  . Hepatitis B virus infection 03/2007  . Thrombocytopenia ([place])   . Diabetes mellitus     Type II  . Hyperlipidemia   . GERD (gastroesophageal reflux disease)   . Osteoarthritis   . Depression   . Recurrent upper respiratory infection (URI)     head cold last week- no fever- states resolved  . Blood transfusion     platelets  . Chronic kidney disease     kidney stones 2012  . Hepatitis     hepatitis b/ newly diagnosed with portal hypertension  . Splenomegaly     LOV Dr Lamonte Sakai  12/12 on chart   . Hypertension   . Anxiety     h/o panic attacks   . Obstructive sleep apnea (adult) (pediatric)     mild with AHI 11.61/hr now on CPAP at 15cm h2o, CPAP is used q night   . Sciatica     lumbar region - 2010, also has had numerous injections   . Blood dyscrasia     thrombocytopenia     MEDICATIONS:  Prior to Admission medications   Medication Sig Start Date End Date Taking? Authorizing Provider  Albiglutide (TANZEUM) 50 MG PEN Inject 50 mg into the skin once a week. 09/15/14   Historical Provider, MD  alprazolam Duanne Moron) 2 MG tablet Take 2 mg by mouth 3 (three) times daily as needed for anxiety.     Historical Provider, MD  amoxicillin (AMOXIL) 500 MG tablet Take 500 mg by mouth 4 (four) times daily.    Historical Provider, MD  atenolol (TENORMIN) 25 MG tablet Take 25 mg by mouth every morning.    Historical Provider, MD  atorvastatin (LIPITOR) 40 MG tablet Take 40 mg by mouth daily before breakfast.     Historical Provider, MD  HYDROmorphone (DILAUDID) 8 MG tablet Take 8 mg by mouth every 6 (six) hours as needed for moderate pain or severe pain.    Historical Provider, MD  lisinopril (PRINIVIL,ZESTRIL) 2.5 MG tablet Take 2.5 mg by mouth daily.    Historical  Provider, MD  metFORMIN (GLUCOPHAGE) 1000 MG tablet Take 1,000 mg by mouth 2 (two) times daily with a meal.     Historical Provider, MD  morphine (MS CONTIN) 30 MG 12 hr tablet Take 30 mg by mouth every 12 (twelve) hours.    Historical Provider, MD  omega-3 acid ethyl esters (LOVAZA) 1 G capsule Take 1 g by mouth daily before breakfast.     Historical Provider, MD  pantoprazole (PROTONIX) 40 MG tablet Take 40 mg by mouth daily before breakfast.     Historical Provider, MD  selegiline (EMSAM) 6 MG/24HR Place 1 patch onto the skin daily.     Historical Provider, MD  tamsulosin (FLOMAX) 0.4 MG CAPS capsule Take 1 capsule (0.4 mg total) by mouth daily after supper. 02/05/15   Rod Can, MD  TOUJEO SOLOSTAR 300 UNIT/ML SOPN Inject 80  Units into the skin daily before breakfast.  04/18/14   Historical Provider, MD  vitamin E 400 UNIT capsule Take 400 Units by mouth daily.    Historical Provider, MD  zaleplon (SONATA) 10 MG capsule Take 10 mg by mouth at bedtime. May take an additional 10mg  by mouth if awakens during the night 03/21/14   Historical Provider, MD    ALLERGIES:  Allergies  Allergen Reactions  . Cyclobenzaprine Anaphylaxis    REACTION:  Not compatible with Emsam patch.  . Canagliflozin Itching and Other (See Comments)    Yeast infections   . Meperidine Rash    REACTION: not compatible with Emsam patch.  . Vicodin [Hydrocodone-Acetaminophen] Hives and Other (See Comments)    REACTION: pt turns red and has hot flashes   . Nsaids Diarrhea    Stomach Upset     SOCIAL HISTORY:  Social History  Substance Use Topics  . Smoking status: Former Smoker -- 1.00 packs/day    Quit date: 12/31/1985  . Smokeless tobacco: Not on file  . Alcohol Use: No    FAMILY HISTORY: Family History  Problem Relation Age of Onset  . Cancer Mother     EXAM: BP 169/76 mmHg  Pulse 96  Temp(Src) 98.7 F (37.1 C)  Resp 20  SpO2 92% CONSTITUTIONAL: Alert and oriented and responds appropriately to questions. Well-appearing; well-nourished, afebrile and in no distress HEAD: Normocephalic EYES: Conjunctivae clear, PERRL ENT: normal nose; no rhinorrhea; moist mucous membranes; pharynx without lesions noted NECK: Supple, no meningismus, no LAD  CARD: RRR; S1 and S2 appreciated; no murmurs, no clicks, no rubs, no gallops RESP: Normal chest excursion without splinting or tachypnea; breath sounds clear and equal bilaterally; no wheezes, no rhonchi, no rales, no hypoxia or respiratory distress, speaking full sentences ABD/GI: Normal bowel sounds; non-distended; soft, non-tender, no rebound, no guarding, no peritoneal signs. Indwelling foley catheter in place.  BACK:  The back appears normal and is appropriately TTP over the lumbar  region, there is no CVA tenderness. Large clean dressing from thoracic to lumbar, over left lower lumbar paraspinal muscle area, and steri strips over the incision in the left gluteal region with no drainage or bleeding, ecchymosis around the incision site of the left gluteal area and some erythema without warmth, induration, fluctuance EXT: Normal ROM in all joints; non-tender to palpation; no edema; normal capillary refill; no cyanosis, no calf tenderness or swelling    SKIN: Normal color for age and race; warm NEURO: Moves all extremities equally, sensation to light touch intact diffusely, cranial nerves II through XII intact. Normal gait. PSYCH: The patient's mood and manner are appropriate.  Grooming and personal hygiene are appropriate.  MEDICAL DECISION MAKING: Patient here with chronic back pain after having a spinal stimulant or place. He has no new neurologic deficits. States that he did not take any pain medication after being discharged today because he was not sure what he should take. It appears he is on MS Contin twice a day and Dilaudid every 6 hours per his report as needed. I do not feel he needs further emergent workup. I will give him a dose of IV Dilaudid in the emergency department but have advised him to start taking his pain medication at home. He needs emergent imaging at this time I do not feel orthopedic states to be contacted emergently. I recommend close follow-up with his orthopedic physician and have recommended that if he has questions that he can call the on-call phone number provided to him at discharge. I have also provided him again with this phone number as well. Discussed at length return precautions that would require him to come back to the emergency department. Have also recommended that he stay on a bowel regimen to keep from becoming constipated. Have discussed fully catheter care and provided this information and his discharge paperwork as well. He verbalizes  understanding and is comfortable with this plan.  I personally performed the services described in this documentation, which was scribed in my presence. The recorded information has been reviewed and is accurate.      [place], DO 02/05/15 2347

## 2015-02-05 NOTE — Progress Notes (Signed)
Physical Therapy Treatment Patient Details Name: Victor [name] MRN: [id number] DOB: 03/10/1960 Today's Date: 02/05/2015    History of Present Illness Pt adm for implant of spinal cord stimulator.    PT Comments    Pt with 10/10 pain, standing and walking is most comfortable position for him. He is hoping to DC home soon today. Answered all questions related to mobility. I have encouraged the patient to gradually increase activity daily to tolerance.    Follow Up Recommendations  Home health PT     Equipment Recommendations  Rolling walker with 5" wheels (Pt states his walker at home is a bit too narrow for him.  )    Recommendations for Other Services       Precautions / Restrictions Precautions Precautions: Back;Fall Precaution Comments: Discussed no lifting, bending Restrictions Weight Bearing Restrictions: No    Mobility  Bed Mobility Overal bed mobility: Needs Assistance Bed Mobility: Sit to Supine       Sit to supine: Min assist   General bed mobility comments: Needed assist to bring LLE into the bed  Transfers Overall transfer level: Needs assistance Equipment used: Rolling walker (2 wheeled) Transfers: Sit to/from Stand Sit to Stand: Supervision         General transfer comment: Used correct technique  Ambulation/Gait Ambulation/Gait assistance: Supervision Ambulation Distance (Feet): 120 Feet Assistive device: Rolling walker (2 wheeled) Gait Pattern/deviations: Step-to pattern;Decreased stride length Gait velocity: much slower than normal gait speed   General Gait Details: very slow, guarded gait. Pt reports he walks slow to prevent from injuring himslef.  Took 2 standing rest breaks while ambulating   Stairs Stairs:  (has already practiced stairs and has no questions)          Wheelchair Mobility    Modified Rankin (Stroke Patients Only)       Balance     Sitting balance-Leahy Scale: Fair       Standing balance-Leahy Scale:  Fair                      Cognition Arousal/Alertness: Awake/alert Behavior During Therapy: Anxious Overall Cognitive Status: Within Functional Limits for tasks assessed                      Exercises      General Comments General comments (skin integrity, edema, etc.): Pt was up standing by himself in room when PT arrived.  Pt reported that standing was the only place he couild get pain relief. No family present      Pertinent Vitals/Pain Pain Assessment: 0-10 Pain Score: 10-Worst pain ever Pain Location: back Pain Descriptors / Indicators: Constant Pain Intervention(s): Limited activity within patient's tolerance;Monitored during session;Premedicated before session;Repositioned    Home Living                      Prior Function            PT Goals (current goals can now be found in the care plan section) Acute Rehab PT Goals Patient Stated Goal: Go home today Progress towards PT goals: Progressing toward goals    Frequency  Min 5X/week    PT Plan Current plan remains appropriate    Co-evaluation             End of Session Equipment Utilized During Treatment: Gait belt Activity Tolerance: Patient limited by pain Patient left: in bed;with call bell/phone within reach     Time:  [id number] PT Time Calculation (min) (ACUTE ONLY): 28 min  Charges:  $Gait Training: 23-37 mins                    G Codes:      Melvern Banker Mar 02, 2015, 11:30 AM Lavonia Dana, PT  [phone] March 02, 2015

## 2015-02-05 NOTE — Discharge Instructions (Signed)
Please take your pain medication as prescribed. Instructions on how to take your pain medication would be on your prescription bottles. Please follow your discharge information provided by nursing staff. If you have further questions you may contact the orthopedic physician on call. If you have new or worsening back pain that is uncontrolled with your pain medication, fever, new numbness or weakness, please return to the emergency department.   Back Pain, Adult Back pain is very common in adults.The cause of back pain is rarely dangerous and the pain often gets better over time.The cause of your back pain may not be known. Some common causes of back pain include:  Strain of the muscles or ligaments supporting the spine.  Wear and tear (degeneration) of the spinal disks.  Arthritis.  Direct injury to the back. For many people, back pain may return. Since back pain is rarely dangerous, most people can learn to manage this condition on their own. HOME CARE INSTRUCTIONS Watch your back pain for any changes. The following actions may help to lessen any discomfort you are feeling:  Remain active. It is stressful on your back to sit or stand in one place for long periods of time. Do not sit, drive, or stand in one place for more than 30 minutes at a time. Take short walks on even surfaces as soon as you are able.Try to increase the length of time you walk each day.  Exercise regularly as directed by your health care provider. Exercise helps your back heal faster. It also helps avoid future injury by keeping your muscles strong and flexible.  Do not stay in bed.Resting more than 1-2 days can delay your recovery.  Pay attention to your body when you bend and lift. The most comfortable positions are those that put less stress on your recovering back. Always use proper lifting techniques, including:  Bending your knees.  Keeping the load close to your body.  Avoiding twisting.  Find a  comfortable position to sleep. Use a firm mattress and lie on your side with your knees slightly bent. If you lie on your back, put a pillow under your knees.  Avoid feeling anxious or stressed.Stress increases muscle tension and can worsen back pain.It is important to recognize when you are anxious or stressed and learn ways to manage it, such as with exercise.  Take medicines only as directed by your health care provider. Over-the-counter medicines to reduce pain and inflammation are often the most helpful.Your health care provider may prescribe muscle relaxant drugs.These medicines help dull your pain so you can more quickly return to your normal activities and healthy exercise.  Apply ice to the injured area:  Put ice in a plastic bag.  Place a towel between your skin and the bag.  Leave the ice on for 20 minutes, 2-3 times a day for the first 2-3 days. After that, ice and heat may be alternated to reduce pain and spasms.  Maintain a healthy weight. Excess weight puts extra stress on your back and makes it difficult to maintain good posture. SEEK MEDICAL CARE IF:  You have pain that is not relieved with rest or medicine.  You have increasing pain going down into the legs or buttocks.  You have pain that does not improve in one week.  You have night pain.  You lose weight.  You have a fever or chills. SEEK IMMEDIATE MEDICAL CARE IF:  1. You develop new bowel or bladder control problems. 2. You have unusual  weakness or numbness in your arms or legs. 3. You develop nausea or vomiting. 4. You develop abdominal pain. 5. You feel faint.   This information is not intended to replace advice given to you by your health care provider. Make sure you discuss any questions you have with your health care provider.   Document Released: 01/29/2005 Document Revised: 02/19/2014 Document Reviewed: 06/02/2013 Elsevier Interactive Patient Education 2016 [place], Adult A Foley catheter is a soft, flexible tube that is placed into the bladder to drain urine. A Foley catheter may be inserted if:  You leak urine or are not able to control when you urinate (urinary incontinence).  You are not able to urinate when you need to (urinary retention).  You had prostate surgery or surgery on the genitals.  You have certain medical conditions, such as multiple sclerosis, dementia, or a spinal cord injury. If you are going home with a Foley catheter in place, follow the instructions below. TAKING CARE OF THE CATHETER  Wash your hands with soap and water.  Using mild soap and warm water on a clean washcloth:  Clean the area on your body closest to the catheter insertion site using a circular motion, moving away from the catheter. Never wipe toward the catheter because this could sweep bacteria up into the urethra and cause infection.  Remove all traces of soap. Pat the area dry with a clean towel. For males, reposition the foreskin.  Attach the catheter to your leg so there is no tension on the catheter. Use adhesive tape or a leg strap. If you are using adhesive tape, remove any sticky residue left behind by the previous tape you used.  Keep the drainage bag below the level of the bladder, but keep it off the floor.  Check throughout the day to be sure the catheter is working and urine is draining freely. Make sure the tubing does not become kinked.  Do not pull on the catheter or try to remove it. Pulling could damage internal tissues. TAKING CARE OF THE DRAINAGE BAGS You will be given two drainage bags to take home. One is a large overnight drainage bag, and the other is a smaller leg bag that fits underneath clothing. You may wear the overnight bag at any time, but you should never wear the smaller leg bag at night. Follow the instructions below for how to empty, change, and clean your drainage bags. Emptying the Drainage Bag You must empty your  drainage bag when it is  - full or at least 2-3 times a day.  Wash your hands with soap and water.  Keep the drainage bag below your hips, below the level of your bladder. This stops urine from going back into the tubing and into your bladder.  Hold the dirty bag over the toilet or a clean container.  Open the pour spout at the bottom of the bag and empty the urine into the toilet or container. Do not let the pour spout touch the toilet, container, or any other surface. Doing so can place bacteria on the bag, which can cause an infection.  Clean the pour spout with a gauze pad or cotton ball that has rubbing alcohol on it.  Close the pour spout.  Attach the bag to your leg with adhesive tape or a leg strap.  Wash your hands well. Changing the Drainage Bag Change your drainage bag once a month or sooner if it starts to smell  bad or look dirty. Below are steps to follow when changing the drainage bag. 6. Wash your hands with soap and water. 7. Pinch off the rubber catheter so that urine does not spill out. 8. Disconnect the catheter tube from the drainage tube at the connection valve. Do not let the tubes touch any surface. 9. Clean the end of the catheter tube with an alcohol wipe. Use a different alcohol wipe to clean the end of the drainage tube. 10. Connect the catheter tube to the drainage tube of the clean drainage bag. 11. Attach the new bag to the leg with adhesive tape or a leg strap. Avoid attaching the new bag too tightly. 12. Wash your hands well. Cleaning the Drainage Bag 1. Wash your hands with soap and water. 2. Wash the bag in warm, soapy water. 3. Rinse the bag thoroughly with warm water. 4. Fill the bag with a solution of white vinegar and water (1 cup vinegar to 1 qt warm water [.2 L vinegar to 1 L warm water]). Close the bag and soak it for 30 minutes in the solution. 5. Rinse the bag with warm water. 6. Hang the bag to dry with the pour spout open and hanging  downward. 7. Store the clean bag (once it is dry) in a clean plastic bag. 8. Wash your hands well. PREVENTING INFECTION  Wash your hands before and after handling your catheter.  Take showers daily and wash the area where the catheter enters your body. Do not take baths. Replace wet leg straps with dry ones, if this applies.  Do not use powders, sprays, or lotions on the genital area. Only use creams, lotions, or ointments as directed by your caregiver.  For females, wipe from front to back after each bowel movement.  Drink enough fluids to keep your urine clear or pale yellow unless you have a fluid restriction.  Do not let the drainage bag or tubing touch or lie on the floor.  Wear cotton underwear to absorb moisture and to keep your skin drier. SEEK MEDICAL CARE IF:   Your urine is cloudy or smells unusually bad.  Your catheter becomes clogged.  You are not draining urine into the bag or your bladder feels full.  Your catheter starts to leak. SEEK IMMEDIATE MEDICAL CARE IF:   You have pain, swelling, redness, or pus where the catheter enters the body.  You have pain in the abdomen, legs, lower back, or bladder.  You have a fever.  You see blood fill the catheter, or your urine is pink or red.  You have nausea, vomiting, or chills.  Your catheter gets pulled out. MAKE SURE YOU:   Understand these instructions.  Will watch your condition.  Will get help right away if you are not doing well or get worse.   This information is not intended to replace advice given to you by your health care provider. Make sure you discuss any questions you have with your health care provider.   Document Released: 01/29/2005 Document Revised: 06/15/2013 Document Reviewed: 01/21/2012 Elsevier Interactive Patient Education 2016 [place].   Spinal Cord Stimulation Trial A spinal cord stimulation trial uses a spinal cord stimulator to see if your pain can be reduced. The spinal  cord simulator is a small device with wire (leads) that are placed in your back. The stimulator sends electrical pulses to the spinal cord. These electrical pulses block the nerve impulses that cause pain. If the spinal cord stimulation trial reduces  your pain, a permanent spinal cord stimulator may be placed (implanted). SPINAL CORD STIMULATOR PLACEMENT The spinal cord stimulator will be put in your back. Where the spinal cord stimulator is placed depends on where your pain is located.For a trial, the entire device is not put under your skin (implanted). Only the leads that go from the stimulator to the spinal cord are implanted. HOME CARE INSTRUCTIONS  You will be given information about the spinal cord stimulator. It will include:  How and when to use spinal cord stimulator.  How to care for the incision site and bandage.  What type of activity you can and cannot do.  What records to keep. You will have to keep a log of when you use it. You will need to say if your pain is less, the same or worse when the stimulator is in use. These records will show your caregiver whether the stimulator will work for you long-term.  In 3 to 5 days, you will need to come back to the hospital or clinic. You and your caregiver will discuss how the spinal cord stimulator worked for you and whether a permanent one should be placed.  Someone will need to drive you home after the spinal cord stimulator was placed.  Take medication as directed. Ask your caregiver if it is okay to take over-the-counter medicine. SEEK MEDICAL CARE IF:  The stimulator insertion site is red or swollen.  Blood or fluid leaks from the stimulator insertion site. SEEK IMMEDIATE MEDICAL CARE IF:   The stimulator leads come out.  The bandage that covers the stimulator leads comes off.  Your pain gets worse.  You develop numbness or weakness in your legs or you have trouble walking.  You have problems urinating or having a  bowel movement.  You have a fever or persistent symptoms for more than 2-3 days.  You have a fever and your symptoms suddenly get worse.   This information is not intended to replace advice given to you by your health care provider. Make sure you discuss any questions you have with your health care provider.   Document Released: 05/16/2010 Document Revised: 01/16/2012 Document Reviewed: 05/16/2010 Elsevier Interactive Patient Education [employer].

## 2015-02-05 NOTE — Progress Notes (Signed)
Pt discharging at this time with his wife taking all personal belongings. Surgical dressing changed clean dry and intact. Foley in place, instructed by Md to follow up with Urologist cath care provided with instructions and teach back. His wife will be caring for him at home pt already has a walker at home. Discharge instructions with  prescription provided with verbal understanding. IV discontinued, dry dressing applied. No noted distress.

## 2015-02-06 NOTE — ED Notes (Signed)
Pt attempting to contact wife for discharge.

## 2015-02-07 ENCOUNTER — Encounter (HOSPITAL_COMMUNITY): Payer: Self-pay | Admitting: Emergency Medicine

## 2015-02-07 ENCOUNTER — Emergency Department (HOSPITAL_COMMUNITY)
Admission: EM | Admit: 2015-02-07 | Discharge: 2015-02-07 | Disposition: A | Discharge status: Home / Self Care | Payer: BLUE CROSS/BLUE SHIELD | Attending: Emergency Medicine | Admitting: Emergency Medicine

## 2015-02-07 DIAGNOSIS — G8929 Other chronic pain: Secondary | ICD-10-CM

## 2015-02-07 DIAGNOSIS — Z87891 Personal history of nicotine dependence: Secondary | ICD-10-CM | POA: Insufficient documentation

## 2015-02-07 DIAGNOSIS — Z79899 Other long term (current) drug therapy: Secondary | ICD-10-CM | POA: Insufficient documentation

## 2015-02-07 DIAGNOSIS — E785 Hyperlipidemia, unspecified: Secondary | ICD-10-CM | POA: Insufficient documentation

## 2015-02-07 DIAGNOSIS — M549 Dorsalgia, unspecified: Secondary | ICD-10-CM

## 2015-02-07 DIAGNOSIS — Z8619 Personal history of other infectious and parasitic diseases: Secondary | ICD-10-CM | POA: Diagnosis not present

## 2015-02-07 DIAGNOSIS — K219 Gastro-esophageal reflux disease without esophagitis: Secondary | ICD-10-CM | POA: Insufficient documentation

## 2015-02-07 DIAGNOSIS — M545 Low back pain: Secondary | ICD-10-CM | POA: Insufficient documentation

## 2015-02-07 DIAGNOSIS — N189 Chronic kidney disease, unspecified: Secondary | ICD-10-CM | POA: Diagnosis not present

## 2015-02-07 DIAGNOSIS — E119 Type 2 diabetes mellitus without complications: Secondary | ICD-10-CM | POA: Diagnosis not present

## 2015-02-07 DIAGNOSIS — F329 Major depressive disorder, single episode, unspecified: Secondary | ICD-10-CM | POA: Insufficient documentation

## 2015-02-07 DIAGNOSIS — F419 Anxiety disorder, unspecified: Secondary | ICD-10-CM | POA: Diagnosis not present

## 2015-02-07 DIAGNOSIS — Z862 Personal history of diseases of the blood and blood-forming organs and certain disorders involving the immune mechanism: Secondary | ICD-10-CM | POA: Insufficient documentation

## 2015-02-07 DIAGNOSIS — I129 Hypertensive chronic kidney disease with stage 1 through stage 4 chronic kidney disease, or unspecified chronic kidney disease: Secondary | ICD-10-CM | POA: Insufficient documentation

## 2015-02-07 LAB — URINE MICROSCOPIC-ADD ON: Squamous Epithelial / LPF: NONE SEEN

## 2015-02-07 LAB — CBC WITH DIFFERENTIAL/PLATELET
Basophils Absolute: 0 10*3/uL (ref 0.0–0.1)
Basophils Relative: 0 %
Eosinophils Absolute: 0.1 10*3/uL (ref 0.0–0.7)
Eosinophils Relative: 4 %
HCT: 35.2 % — ABNORMAL LOW (ref 39.0–52.0)
Hemoglobin: 12 g/dL — ABNORMAL LOW (ref 13.0–17.0)
Lymphocytes Relative: 25 %
Lymphs Abs: 0.9 10*3/uL (ref 0.7–4.0)
MCH: 29.4 pg (ref 26.0–34.0)
MCHC: 34.1 g/dL (ref 30.0–36.0)
MCV: 86.3 fL (ref 78.0–100.0)
Monocytes Absolute: 0.5 10*3/uL (ref 0.1–1.0)
Monocytes Relative: 13 %
Neutro Abs: 2.1 10*3/uL (ref 1.7–7.7)
Neutrophils Relative %: 58 %
Platelets: 59 10*3/uL — ABNORMAL LOW (ref 150–400)
RBC: 4.08 MIL/uL — ABNORMAL LOW (ref 4.22–5.81)
RDW: 13.8 % (ref 11.5–15.5)
WBC: 3.7 10*3/uL — ABNORMAL LOW (ref 4.0–10.5)

## 2015-02-07 LAB — I-STAT CHEM 8, ED
BUN: 11 mg/dL (ref 6–20)
Calcium, Ion: 1.07 mmol/L — ABNORMAL LOW (ref 1.12–1.23)
Chloride: 94 mmol/L — ABNORMAL LOW (ref 101–111)
Creatinine, Ser: 0.7 mg/dL (ref 0.61–1.24)
Glucose, Bld: 188 mg/dL — ABNORMAL HIGH (ref 65–99)
HCT: 37 % — ABNORMAL LOW (ref 39.0–52.0)
Hemoglobin: 12.6 g/dL — ABNORMAL LOW (ref 13.0–17.0)
Potassium: 3.3 mmol/L — ABNORMAL LOW (ref 3.5–5.1)
Sodium: 138 mmol/L (ref 135–145)
TCO2: 30 mmol/L (ref 0–100)

## 2015-02-07 LAB — URINALYSIS, ROUTINE W REFLEX MICROSCOPIC
Glucose, UA: 500 mg/dL — AB
Ketones, ur: 15 mg/dL — AB
Nitrite: POSITIVE — AB
Protein, ur: 30 mg/dL — AB
Specific Gravity, Urine: 1.031 — ABNORMAL HIGH (ref 1.005–1.030)
pH: 6 (ref 5.0–8.0)

## 2015-02-07 MED ORDER — HYDROMORPHONE HCL 1 MG/ML IJ SOLN
2.0000 mg | Freq: Once | INTRAMUSCULAR | Status: AC
  Administered 2015-02-07: 2 mg via INTRAVENOUS
  Filled 2015-02-07: qty 2

## 2015-02-07 MED ORDER — MAGNESIUM CITRATE PO SOLN
0.5000 | Freq: Once | ORAL | Status: AC
  Administered 2015-02-07: 0.5 via ORAL
  Filled 2015-02-07: qty 296

## 2015-02-07 MED ORDER — POTASSIUM CHLORIDE CRYS ER 20 MEQ PO TBCR
40.0000 meq | EXTENDED_RELEASE_TABLET | Freq: Once | ORAL | Status: AC
  Administered 2015-02-07: 40 meq via ORAL
  Filled 2015-02-07: qty 2

## 2015-02-07 MED ORDER — FLEET ENEMA 7-19 GM/118ML RE ENEM
1.0000 | ENEMA | Freq: Once | RECTAL | Status: AC
  Administered 2015-02-07: 1 via RECTAL
  Filled 2015-02-07: qty 1

## 2015-02-07 NOTE — ED Notes (Signed)
PT completed assessment and social worker at bedside.

## 2015-02-07 NOTE — Progress Notes (Signed)
Call attempt to reach Dr Rolena Infante at office number Office closed Cm sent an in basket message about wife agreed to PDN services Left CM Mobile number for Dr Rolena Infante to return a call as needed

## 2015-02-07 NOTE — ED Notes (Signed)
[place] Case Manager at bedside.

## 2015-02-07 NOTE — Progress Notes (Signed)
ED CM consulted to assist with home health services for pt He and wife not able to afford private pay for snf at this time per [place] a list of PDN services to be given to pt & wife with fax confirmation time as 02/07/15 at 1332  CM reviewed EPIC notes Cm called pod E Rn and wife give a phone to speak with the CM When CM assessed pt and wife for d/c needs Wife states she does not need snf for a month and they could not afford snf for a month time out of pocket Wife states "need someone to come in to watch Kathy while I go out to the store or something"   Cm reviewed Private duty nursing (PDN) services with her and she agreed to only PDN Wife appreciative of services rendered and resources Teach back method used

## 2015-02-07 NOTE — ED Notes (Signed)
Patient recently had back surgery for a spinal cord stimulator by Dr Rolena Infante on 02/02/2015 discharged on 02/05/2015. Since the back surgery having constant back pain 10/10 sharp. Called Doctor sent to ED for evaluation today.

## 2015-02-07 NOTE — ED Notes (Signed)
Social worker at bedside.

## 2015-02-07 NOTE — ED Provider Notes (Signed)
CSN: [id number]     Arrival date & time 02/07/15  [id number] History   First MD Initiated Contact with Patient 02/07/15 [phone]     Chief Complaint  Patient presents with  . Back Pain  . Post-op Problem     (Consider location/radiation/quality/duration/timing/severity/associated sxs/prior Treatment) HPI Planes of severe low back pain for several years. Pain is not improved and has worsened since spinal cord stimulator was placed 5 days ago. He denies fever denies loss of bowel control. Pain is worse with changing positions improved with lying still. He treated himself with hydromorphone, last dose 10 PM yesterday. No other associated symptoms Past Medical History  Diagnosis Date  . Hepatitis B virus infection 03/2007  . Thrombocytopenia ([place])   . Diabetes mellitus     Type II  . Hyperlipidemia   . GERD (gastroesophageal reflux disease)   . Osteoarthritis   . Depression   . Recurrent upper respiratory infection (URI)     head cold last week- no fever- states resolved  . Blood transfusion     platelets  . Chronic kidney disease     kidney stones 2012  . Hepatitis     hepatitis b/ newly diagnosed with portal hypertension  . Splenomegaly     LOV Dr Lamonte Sakai  12/12 on chart  . Hypertension   . Anxiety     h/o panic attacks   . Obstructive sleep apnea (adult) (pediatric)     mild with AHI 11.61/hr now on CPAP at 15cm h2o, CPAP is used q night   . Sciatica     lumbar region - 2010, also has had numerous injections   . Blood dyscrasia     thrombocytopenia    Past Surgical History  Procedure Laterality Date  . Hand surgery      right hand  . Shoulder surgery      left shoulder  . Knee surgery      5 surgeries to right knee, 2 surgeries to left knee  . Lumbar laminectomy/decompression microdiscectomy  02/19/2011    Procedure: LUMBAR LAMINECTOMY/DECOMPRESSION MICRODISCECTOMY;  Surgeon: Johnn Hai;  Location: [facility];  Service: Orthopedics;  Laterality: Left;  decompression l5-s1 l4-5 on  left  . Kyphoplasty    . Spinal cord stimulator insertion N/A 02/02/2015    Procedure: LUMBAR SPINAL CORD STIMULATOR INSERTION;  Surgeon: Melina Schools, MD;  Location: [facility];  Service: Orthopedics;  Laterality: N/A;   Family History  Problem Relation Age of Onset  . Cancer Mother    Social History  Substance Use Topics  . Smoking status: Former Smoker -- 1.00 packs/day    Quit date: 12/31/1985  . Smokeless tobacco: None  . Alcohol Use: No    Review of Systems  Genitourinary:       Foley catheter in place  Musculoskeletal: Positive for back pain.  Allergic/Immunologic: Positive for immunocompromised state.       Diabetic  All other systems reviewed and are negative.     Allergies  Cyclobenzaprine; Canagliflozin; Meperidine; Vicodin; and Nsaids  Home Medications   Prior to Admission medications   Medication Sig Start Date End Date Taking? Authorizing Provider  Albiglutide (TANZEUM) 50 MG PEN Inject 50 mg into the skin once a week. 09/15/14   Historical Provider, MD  alprazolam Duanne Moron) 2 MG tablet Take 2 mg by mouth 3 (three) times daily as needed for anxiety.     Historical Provider, MD  amoxicillin (AMOXIL) 500 MG tablet Take 500 mg by mouth 4 (four)  times daily.    Historical Provider, MD  atenolol (TENORMIN) 25 MG tablet Take 25 mg by mouth every morning.    Historical Provider, MD  atorvastatin (LIPITOR) 40 MG tablet Take 40 mg by mouth daily before breakfast.     Historical Provider, MD  HYDROmorphone (DILAUDID) 8 MG tablet Take 8 mg by mouth every 6 (six) hours as needed for moderate pain or severe pain.    Historical Provider, MD  lisinopril (PRINIVIL,ZESTRIL) 2.5 MG tablet Take 2.5 mg by mouth daily.    Historical Provider, MD  metFORMIN (GLUCOPHAGE) 1000 MG tablet Take 1,000 mg by mouth 2 (two) times daily with a meal.     Historical Provider, MD  morphine (MS CONTIN) 30 MG 12 hr tablet Take 30 mg by mouth every 12 (twelve) hours.    Historical Provider, MD  omega-3  acid ethyl esters (LOVAZA) 1 G capsule Take 1 g by mouth daily before breakfast.     Historical Provider, MD  pantoprazole (PROTONIX) 40 MG tablet Take 40 mg by mouth daily before breakfast.     Historical Provider, MD  selegiline (EMSAM) 6 MG/24HR Place 1 patch onto the skin daily.     Historical Provider, MD  tamsulosin (FLOMAX) 0.4 MG CAPS capsule Take 1 capsule (0.4 mg total) by mouth daily after supper. 02/05/15   Rod Can, MD  TOUJEO SOLOSTAR 300 UNIT/ML SOPN Inject 80 Units into the skin daily before breakfast.  04/18/14   Historical Provider, MD  vitamin E 400 UNIT capsule Take 400 Units by mouth daily.    Historical Provider, MD  zaleplon (SONATA) 10 MG capsule Take 10 mg by mouth at bedtime. May take an additional 10mg  by mouth if awakens during the night 03/21/14   Historical Provider, MD   BP 153/75 mmHg  Pulse 102  Temp(Src) 98.6 F (37 C) (Oral)  Resp 18  Ht 6' (1.829 m)  Wt 280 lb (127.007 kg)  BMI 37.97 kg/m2  SpO2 95% Physical Exam  Constitutional: He is oriented to person, place, and time. He appears well-developed and well-nourished. He appears distressed.  Appears uncomfortable  HENT:  Head: Normocephalic and atraumatic.  Eyes: Conjunctivae are normal. Pupils are equal, round, and reactive to light.  Neck: Neck supple. No tracheal deviation present. No thyromegaly present.  Cardiovascular: Normal rate and regular rhythm.   No murmur heard. Pulmonary/Chest: Effort normal and breath sounds normal.  Abdominal: Soft. Bowel sounds are normal. He exhibits no distension. There is no tenderness.  Obese  Musculoskeletal: Normal range of motion. He exhibits no edema or tenderness.  No point tenderness. He has pain at lumbar air when he flexes at the waist  Neurological: He is alert and oriented to person, place, and time. No cranial nerve deficit. Coordination normal.  Gait normal, DTRs symmetric bilaterally at knee jerk and ankle jerk and biceps toes downward going  bilaterally  Skin: Skin is warm and dry. No rash noted.  Psychiatric: He has a normal mood and affect.  Nursing note and vitals reviewed.   ED Course  Procedures (including critical care time) Labs Review Labs Reviewed  CBC WITH DIFFERENTIAL/PLATELET    Imaging Review No results found. I have personally reviewed and evaluated these images and lab results as part of my medical decision-making.   EKG Interpretation None     9:55 AM pain improved after treatment with intravenous hydromorphone. Results for orders placed or performed during the hospital encounter of 02/07/15  CBC with Differential/Platelet  Result Value  Ref Range   WBC 3.7 (L) 4.0 - 10.5 K/uL   RBC 4.08 (L) 4.22 - 5.81 MIL/uL   Hemoglobin 12.0 (L) 13.0 - 17.0 g/dL   HCT 35.2 (L) 39.0 - 52.0 %   MCV 86.3 78.0 - 100.0 fL   MCH 29.4 26.0 - 34.0 pg   MCHC 34.1 30.0 - 36.0 g/dL   RDW 13.8 11.5 - 15.5 %   Platelets 59 (L) 150 - 400 K/uL   Neutrophils Relative % 58 %   Neutro Abs 2.1 1.7 - 7.7 K/uL   Lymphocytes Relative 25 %   Lymphs Abs 0.9 0.7 - 4.0 K/uL   Monocytes Relative 13 %   Monocytes Absolute 0.5 0.1 - 1.0 K/uL   Eosinophils Relative 4 %   Eosinophils Absolute 0.1 0.0 - 0.7 K/uL   Basophils Relative 0 %   Basophils Absolute 0.0 0.0 - 0.1 K/uL  Urinalysis, Routine w reflex microscopic (not at [facility])  Result Value Ref Range   Color, Urine ORANGE (A) YELLOW   APPearance TURBID (A) CLEAR   Specific Gravity, Urine 1.031 (H) 1.005 - 1.030   pH 6.0 5.0 - 8.0   Glucose, UA 500 (A) NEGATIVE mg/dL   Hgb urine dipstick LARGE (A) NEGATIVE   Bilirubin Urine SMALL (A) NEGATIVE   Ketones, ur 15 (A) NEGATIVE mg/dL   Protein, ur 30 (A) NEGATIVE mg/dL   Nitrite POSITIVE (A) NEGATIVE   Leukocytes, UA TRACE (A) NEGATIVE  Urine microscopic-add on  Result Value Ref Range   Squamous Epithelial / LPF NONE SEEN NONE SEEN   WBC, UA 0-5 0 - 5 WBC/hpf   RBC / HPF TOO NUMEROUS TO COUNT 0 - 5 RBC/hpf   Bacteria, UA FEW  (A) NONE SEEN   Urine-Other AMORPHOUS URATES/PHOSPHATES   I-stat chem 8, ed  Result Value Ref Range   Sodium 138 135 - 145 mmol/L   Potassium 3.3 (L) 3.5 - 5.1 mmol/L   Chloride 94 (L) 101 - 111 mmol/L   BUN 11 6 - 20 mg/dL   Creatinine, Ser 0.70 0.61 - 1.24 mg/dL   Glucose, Bld 188 (H) 65 - 99 mg/dL   Calcium, Ion 1.07 (L) 1.12 - 1.23 mmol/L   TCO2 30 0 - 100 mmol/L   Hemoglobin 12.6 (L) 13.0 - 17.0 g/dL   HCT 37.0 (L) 39.0 - 52.0 %   Dg Thoracolumabar Spine  02/02/2015  CLINICAL DATA:  Lumbar spinal cord stimulator placement. EXAM: DG C-ARM 61-120 MIN; THORACOLUMBAR SPINE - 2 VIEW COMPARISON:  None. FINDINGS: Two intraoperative spot images demonstrate spinal stimulator wires projecting over the lower thoracic spinal canal. IMPRESSION: Placement of spinal stimulator device in the lower thoracic region. Electronically Signed   By: Rolm Baptise M.D.   On: 02/02/2015 17:16   Dg C-arm 1-60 Min  02/02/2015  CLINICAL DATA:  Lumbar spinal cord stimulator placement. EXAM: DG C-ARM 61-120 MIN; THORACOLUMBAR SPINE - 2 VIEW COMPARISON:  None. FINDINGS: Two intraoperative spot images demonstrate spinal stimulator wires projecting over the lower thoracic spinal canal. IMPRESSION: Placement of spinal stimulator device in the lower thoracic region. Electronically Signed   By: Rolm Baptise M.D.   On: 02/02/2015 17:16    MDM  I consulted Dr. Rolena Infante, orthopedic surgeon who will come to emergency department to evaluate patient. Doubt UTI patient has indwelling Foley catheter. 0-5 white cells in urine few bacteria Final diagnoses:  None  Dr Rolena Infante evaluated pt and made arrangements for transfer to skilled nursing facility Diagnosis  chronic back pain #2 hyperglycemia #3Hypokalemia #4 hematuria #5 thrombocytopenia     Orlie Dakin, MD 02/07/15 1144

## 2015-02-07 NOTE — Progress Notes (Signed)
CSW engaged with Patient and his wife at Patient's bedside regarding SNF placement. Patient was accepted to [facility] as private pay only, however, Patient and his wife report that they are financially unable to pay for the facility at this time. CSW discussed outpatient rehab options with Patient and his wife. Patient's wife reports that she does not feel that would help at this time. CSW spoke with Joellyn Quails, Case Manager [phone]) regarding [place] options. Case Manager provided CSW with list of local [place] for the patient to receive further assistance in the home. CSW provided list to patient and his wife. Case Management to call Patient remotely to further discuss home health. CSW t/c'ed Dr. Rolena Infante to provide update on Patient's disposition. No further CSW needs identified at this time.  Clinical Social Worker will sign off for now as social work intervention is no longer needed. Please consult [place] again if new need arises.  Holly Bodily, [place]

## 2015-02-07 NOTE — Discharge Summary (Signed)
Patient ID: Victor [name] MRN: [id number] DOB/AGE: 52-Aug-1962 52 y.o.  Admit date: 02/07/2015 Discharge date: 02/07/2015  Admission Diagnoses:  Active Problems:   * No active hospital problems. *   Discharge Diagnoses:  Active Problems:   * No active hospital problems. *  status post   Past Medical History  Diagnosis Date  . Hepatitis B virus infection 03/2007  . Thrombocytopenia ([place])   . Diabetes mellitus     Type II  . Hyperlipidemia   . GERD (gastroesophageal reflux disease)   . Osteoarthritis   . Depression   . Recurrent upper respiratory infection (URI)     head cold last week- no fever- states resolved  . Blood transfusion     platelets  . Chronic kidney disease     kidney stones 2012  . Hepatitis     hepatitis b/ newly diagnosed with portal hypertension  . Splenomegaly     LOV Dr Lamonte Sakai  12/12 on chart  . Hypertension   . Anxiety     h/o panic attacks   . Obstructive sleep apnea (adult) (pediatric)     mild with AHI 11.61/hr now on CPAP at 15cm h2o, CPAP is used q night   . Sciatica     lumbar region - 2010, also has had numerous injections   . Blood dyscrasia     thrombocytopenia     Surgeries:  on * No surgery found *   Consultants:    Discharged Condition: Improved  Hospital Course: Victor [name] is an 54 y.o. male who was admitted 02/07/2015 for operative treatment of pain s/p SCS. Patient failed conservative treatments (please see the history and physical for the specifics) and had severe unremitting pain that affects sleep, daily activities and work/hobbies. After pre-op clearance, the patient was taken to the operating room on * No surgery found * and underwent  .    Patient was given perioperative antibiotics: Anti-infectives    None       Patient was given sequential compression devices and early ambulation to prevent DVT.   Patient benefited maximally from hospital stay and there were no complications. At the time of discharge, the  patient was urinating/moving their bowels without difficulty, tolerating a regular diet, pain is controlled with oral pain medications and they have been cleared by PT/OT.   Recent vital signs: Patient Vitals for the past 24 hrs:  BP Temp Temp src Pulse Resp SpO2 Height Weight  02/07/15 0917 153/75 mmHg 98.6 F (37 C) Oral 102 18 95 % 6' (1.829 m) 127.007 kg (280 lb)     Recent laboratory studies: No results for input(s): WBC, HGB, HCT, PLT, NA, K, CL, CO2, BUN, CREATININE, GLUCOSE, INR, CALCIUM in the last 72 hours.  Invalid input(s): PT, 2   Discharge Medications:     Medication List    ASK your doctor about these medications        alprazolam 2 MG tablet  Commonly known as:  XANAX  Take 2 mg by mouth 3 (three) times daily as needed for anxiety.     amoxicillin 500 MG tablet  Commonly known as:  AMOXIL  Take 500 mg by mouth 4 (four) times daily.     atenolol 25 MG tablet  Commonly known as:  TENORMIN  Take 25 mg by mouth every morning.     atorvastatin 40 MG tablet  Commonly known as:  LIPITOR  Take 40 mg by mouth daily before breakfast.  HYDROmorphone 8 MG tablet  Commonly known as:  DILAUDID  Take 8 mg by mouth every 6 (six) hours as needed for moderate pain or severe pain.     lisinopril 2.5 MG tablet  Commonly known as:  PRINIVIL,ZESTRIL  Take 2.5 mg by mouth daily.     metFORMIN 1000 MG tablet  Commonly known as:  GLUCOPHAGE  Take 1,000 mg by mouth 2 (two) times daily with a meal.     morphine 30 MG 12 hr tablet  Commonly known as:  MS CONTIN  Take 30 mg by mouth every 12 (twelve) hours.     omega-3 acid ethyl esters 1 G capsule  Commonly known as:  LOVAZA  Take 1 g by mouth daily before breakfast.     pantoprazole 40 MG tablet  Commonly known as:  PROTONIX  Take 40 mg by mouth daily before breakfast.     selegiline 6 MG/24HR  Commonly known as:  EMSAM  Place 1 patch onto the skin daily.     tamsulosin 0.4 MG Caps capsule  Commonly known as:   FLOMAX  Take 1 capsule (0.4 mg total) by mouth daily after supper.     TANZEUM 50 MG Pen  Generic drug:  Albiglutide  Inject 50 mg into the skin once a week.     TOUJEO SOLOSTAR 300 UNIT/ML Sopn  Generic drug:  Insulin Glargine  Inject 80 Units into the skin daily before breakfast.     vitamin E 400 UNIT capsule  Take 400 Units by mouth daily.     zaleplon 10 MG capsule  Commonly known as:  SONATA  Take 10 mg by mouth at bedtime. May take an additional 10mg  by mouth if awakens during the night        Diagnostic Studies: Dg Thoracolumabar Spine  02/02/2015  CLINICAL DATA:  Lumbar spinal cord stimulator placement. EXAM: DG C-ARM 61-120 MIN; THORACOLUMBAR SPINE - 2 VIEW COMPARISON:  None. FINDINGS: Two intraoperative spot images demonstrate spinal stimulator wires projecting over the lower thoracic spinal canal. IMPRESSION: Placement of spinal stimulator device in the lower thoracic region. Electronically Signed   By: Rolm Baptise M.D.   On: 02/02/2015 17:16   Dg C-arm 1-60 Min  02/02/2015  CLINICAL DATA:  Lumbar spinal cord stimulator placement. EXAM: DG C-ARM 61-120 MIN; THORACOLUMBAR SPINE - 2 VIEW COMPARISON:  None. FINDINGS: Two intraoperative spot images demonstrate spinal stimulator wires projecting over the lower thoracic spinal canal. IMPRESSION: Placement of spinal stimulator device in the lower thoracic region. Electronically Signed   By: Rolm Baptise M.D.   On: 02/02/2015 17:16        Discharge Plan:  discharge to SNF  Disposition: Patient stable neurologically at this time.  Unable to return home due to limited mobility, pain, and not enough assistance. 1. Pain control:   Ms Contin 30mg  BID    Dilaudid 8mg  po q6 prn 2. Mirilax as need for contipation 3. Flomax 1 tab po with supper. 4. Foley care per routine 5. Carolan Clines, MD - call for f/u in 5 days [phone] 6. CPAP per home regimen for OSA 7. Diabetic medication per  routine    Signed: [place] D for Dr. Melina Schools [facility] [phone] 02/07/2015, 10:10 AM

## 2015-02-07 NOTE — Clinical Social Work Note (Signed)
Clinical Social Work Assessment  Patient Details  Name: Victor [name] MRN: [id number] Date of Birth: [birth year]  Date of referral:  02/07/15               Reason for consult:  Facility Placement                Permission sought to share information with:  Chartered certified accountant granted to share information::  Yes, Verbal Permission Granted  Name::        Agency::     Relationship::     Contact Information:     Housing/Transportation Living arrangements for the past 2 months:  Single Family Home Source of Information:  Patient, Spouse Patient Interpreter Needed:  None Criminal Activity/Legal Involvement Pertinent to Current Situation/Hospitalization:  No - Comment as needed Significant Relationships:  Spouse Lives with:  Spouse Do you feel safe going back to the place where you live?  Yes Need for family participation in patient care:  No (Coment)  Care giving concerns: Patient reports concerns for his wife's ability to care for him .  He reports wanting to make it easier for his wife. Patient reports that his pain has become so bad that he cannot mobilize well and has to utilize a walker. Patient's wife reports that patient cannot get around very well. She reports that she has hurt his back trying to care for him.    Social Worker assessment / plan:  CSW engaged with patient at his bedside. Patient's wife present during assessment. Patient will be referred out to local SNF's for rehabilitation placement.   Employment status:  Retired Forensic scientist:    PT Recommendations:  Not assessed at this time Information / Referral to community resources:  [place]  Patient/Family's Response to care:  Patient and Patient's wife both express gratitude for CSW's efforts. Patient reports that he "just want to make it easier for my wife".   Patient/Family's Understanding of and Emotional Response to Diagnosis, Current Treatment, and Prognosis:  Patient  and Patient's wife express understanding. They deny any further questions or concerns at this time.   Emotional Assessment Appearance:  Appears stated age, Developmentally appropriate Attitude/Demeanor/Rapport:  Lethargic (Engaged; Cooperative; ) Affect (typically observed):  Accepting, Calm Orientation:  Oriented to Self, Oriented to Place, Oriented to  Time, Oriented to Situation Alcohol / Substance use:  Not Applicable Psych involvement (Current and /or in the community):  No (Comment)  Discharge Needs  Concerns to be addressed:  Care Coordination, Discharge Planning Concerns Readmission within the last 30 days:  Yes Current discharge risk:  Physical Impairment Barriers to Discharge:  No Barriers Identified   Judeth Horn, LCSW 02/07/2015, 12:30 PM

## 2015-02-07 NOTE — ED Notes (Signed)
PT arrived to assess patient.

## 2015-02-07 NOTE — Consult Note (Signed)
Victor Kroner, MD Chief Complaint: pain History: HPI: Victor [name] is a 54 y.o. male with history of sciatica and osteoarthritis who presents to the Emergency Department complaining of ongoing, 6/10 in severity, mid lower back pain since having a nerve stimulator on 02/02/15 by me. Pt states that he has not taken the MS Contin and Dilaudid that he was prescribed because he is not sure how or when to take it. Pt denies new numbness or tingling, bowel incontinence, fever, vomiting, and diarrhea. He has a urinary catheter in place. States he is having some discomfort from the catheter as well. States this is the same pain that he was discharged with this morning. They did not contact the physician on call for [place] prior to coming to the hospital on 02/05/15.  Patient contacted me today due to pain and instructed to come to ER for evaluation and SNF transfer.    Past Medical History  Diagnosis Date  . Hepatitis B virus infection 03/2007  . Thrombocytopenia ([place])   . Diabetes mellitus     Type II  . Hyperlipidemia   . GERD (gastroesophageal reflux disease)   . Osteoarthritis   . Depression   . Recurrent upper respiratory infection (URI)     head cold last week- no fever- states resolved  . Blood transfusion     platelets  . Chronic kidney disease     kidney stones 2012  . Hepatitis     hepatitis b/ newly diagnosed with portal hypertension  . Splenomegaly     LOV Dr Lamonte Sakai  12/12 on chart  . Hypertension   . Anxiety     h/o panic attacks   . Obstructive sleep apnea (adult) (pediatric)     mild with AHI 11.61/hr now on CPAP at 15cm h2o, CPAP is used q night   . Sciatica     lumbar region - 2010, also has had numerous injections   . Blood dyscrasia     thrombocytopenia     Allergies  Allergen Reactions  . Cyclobenzaprine Anaphylaxis    REACTION:  Not compatible with Emsam patch.  . Canagliflozin Itching and Other (See Comments)    Yeast infections   . Meperidine  Rash    REACTION: not compatible with Emsam patch.  . Vicodin [Hydrocodone-Acetaminophen] Hives and Other (See Comments)    REACTION: pt turns red and has hot flashes   . Nsaids Diarrhea    Stomach Upset     No current facility-administered medications on file prior to encounter.   Current Outpatient Prescriptions on File Prior to Encounter  Medication Sig Dispense Refill  . Albiglutide (TANZEUM) 50 MG PEN Inject 50 mg into the skin once a week.    Marland Kitchen alprazolam (XANAX) 2 MG tablet Take 2 mg by mouth 3 (three) times daily as needed for anxiety.     Marland Kitchen amoxicillin (AMOXIL) 500 MG tablet Take 500 mg by mouth 4 (four) times daily.    Marland Kitchen atenolol (TENORMIN) 25 MG tablet Take 25 mg by mouth every morning.    Marland Kitchen atorvastatin (LIPITOR) 40 MG tablet Take 40 mg by mouth daily before breakfast.     . HYDROmorphone (DILAUDID) 8 MG tablet Take 8 mg by mouth every 6 (six) hours as needed for moderate pain or severe pain.    Marland Kitchen lisinopril (PRINIVIL,ZESTRIL) 2.5 MG tablet Take 2.5 mg by mouth daily.    . metFORMIN (GLUCOPHAGE) 1000 MG tablet Take 1,000 mg by mouth 2 (two) times daily with  a meal.     . morphine (MS CONTIN) 30 MG 12 hr tablet Take 30 mg by mouth every 12 (twelve) hours.    Marland Kitchen omega-3 acid ethyl esters (LOVAZA) 1 G capsule Take 1 g by mouth daily before breakfast.     . pantoprazole (PROTONIX) 40 MG tablet Take 40 mg by mouth daily before breakfast.     . selegiline (EMSAM) 6 MG/24HR Place 1 patch onto the skin daily.     . tamsulosin (FLOMAX) 0.4 MG CAPS capsule Take 1 capsule (0.4 mg total) by mouth daily after supper. 30 capsule 0  . TOUJEO SOLOSTAR 300 UNIT/ML SOPN Inject 80 Units into the skin daily before breakfast.   0  . vitamin E 400 UNIT capsule Take 400 Units by mouth daily.    . zaleplon (SONATA) 10 MG capsule Take 10 mg by mouth at bedtime. May take an additional 10mg  by mouth if awakens during the night  0    Physical Exam: Filed Vitals:   02/07/15 0917  BP: 153/75  Pulse:  102  Temp: 98.6 F (37 C)  Resp: 18  A+O X3 NVI - 5/5 UE/LE bilaterally Sensation to light touch intact throughout  No sob/cp adb soft/nt Intact pulses in all 4 extremities No incontinence of bowel Foley in place - red tinged color   Image: Dg Thoracolumabar Spine  02/02/2015  CLINICAL DATA:  Lumbar spinal cord stimulator placement. EXAM: DG C-ARM 61-120 MIN; THORACOLUMBAR SPINE - 2 VIEW COMPARISON:  None. FINDINGS: Two intraoperative spot images demonstrate spinal stimulator wires projecting over the lower thoracic spinal canal. IMPRESSION: Placement of spinal stimulator device in the lower thoracic region. Electronically Signed   By: Rolm Baptise M.D.   On: 02/02/2015 17:16   Dg C-arm 1-60 Min  02/02/2015  CLINICAL DATA:  Lumbar spinal cord stimulator placement. EXAM: DG C-ARM 61-120 MIN; THORACOLUMBAR SPINE - 2 VIEW COMPARISON:  None. FINDINGS: Two intraoperative spot images demonstrate spinal stimulator wires projecting over the lower thoracic spinal canal. IMPRESSION: Placement of spinal stimulator device in the lower thoracic region. Electronically Signed   By: Rolm Baptise M.D.   On: 02/02/2015 17:16    A/P:  Patient 5 days s/p SCS placement.  Second trip to ER since discharge due to pain.  Patient on MS Contin and dilaudid for chronic pain. Post-op course complicated by urinary retention.  No evidence of cauda equina syndrome and so foley was placed, flomax started, and arranged urology f/u as outpatient.  Will also check UA - if positive will start oral Abxs  Clinical exam demonstrates no focal motor/sensory deficits.  Pain limiting ambulation but no evidence of neurologic deterioration.   Plan: spoke with social worker - will set up discharge to SNF from ER  Will continue current pain meds.  Due to sleep apnea I am concerned about increasing it.  Will monitor  Mobilization with assistance at the nursing home  F/U with Urology as arranged  F/U with me in 10 days Not a  candidate for [facility] PT given poor function and limited function His wife is unable to care for him at home

## 2015-02-07 NOTE — NC FL2 (Signed)
[place] LEVEL OF CARE SCREENING TOOL     IDENTIFICATION  Patient Name: Victor [name] Birthdate: 1960-10-13 Sex: male Admission Date (Current Location): 02/07/2015  [facility] and [place] Number:  Herbalist and Address:  [facility]. [facility], [place] [address], [place], [place] [address]      Provider Number: [id number]  Attending Physician Name and Address:  Orlie Dakin, MD  Relative Name and Phone Number:   Shaddai Puglia [phone])    Current Level of Care: Hospital Recommended Level of Care: [place] Prior Approval Number:    Date Approved/Denied:   PASRR Number: [id number]  Discharge Plan: SNF    Current Diagnoses: Patient Active Problem List   Diagnosis Date Noted  . Chronic pain 02/02/2015  . Hypertension   . Obstructive sleep apnea 02/04/2013  . Obesity 02/04/2013  . Splenomegaly   . Hepatitis B virus infection   . Thrombocytopenia ([place])   . Sciatica   . Diabetes mellitus   . Hyperlipidemia     Orientation RESPIRATION BLADDER Height & Weight    Self, Time, Situation, Place  Normal Continent 6' (182.9 cm) 280 lbs.  BEHAVIORAL SYMPTOMS/MOOD NEUROLOGICAL BOWEL NUTRITION STATUS      Continent Diet  AMBULATORY STATUS COMMUNICATION OF NEEDS Skin   Limited Assist Verbally Normal                       Personal Care Assistance Level of Assistance  Total care       Total Care Assistance: Limited assistance   Functional Limitations Info             SPECIAL CARE FACTORS FREQUENCY  PT (By licensed PT), OT (By licensed OT)     PT Frequency: 5x/week OT Frequency: 5x/week            Contractures Contractures Info: Not present    Additional Factors Info                  Current Medications (02/07/2015):  This is the current hospital active medication list Current Facility-Administered Medications  Medication Dose Route Frequency Provider Last Rate Last Dose  .  potassium chloride SA (K-DUR,KLOR-CON) CR tablet 40 mEq  40 mEq Oral Once Orlie Dakin, MD       Current Outpatient Prescriptions  Medication Sig Dispense Refill  . Albiglutide (TANZEUM) 50 MG PEN Inject 50 mg into the skin once a week.    Marland Kitchen alprazolam (XANAX) 2 MG tablet Take 2 mg by mouth 3 (three) times daily as needed for anxiety.     Marland Kitchen amoxicillin (AMOXIL) 500 MG tablet Take 500 mg by mouth 4 (four) times daily.    Marland Kitchen atenolol (TENORMIN) 25 MG tablet Take 25 mg by mouth every morning.    Marland Kitchen atorvastatin (LIPITOR) 40 MG tablet Take 40 mg by mouth daily before breakfast.     . HYDROmorphone (DILAUDID) 8 MG tablet Take 8 mg by mouth every 6 (six) hours as needed for moderate pain or severe pain.    Marland Kitchen lisinopril (PRINIVIL,ZESTRIL) 2.5 MG tablet Take 2.5 mg by mouth daily.    . metFORMIN (GLUCOPHAGE) 1000 MG tablet Take 1,000 mg by mouth 2 (two) times daily with a meal.     . morphine (MS CONTIN) 30 MG 12 hr tablet Take 30 mg by mouth every 12 (twelve) hours.    Marland Kitchen omega-3 acid ethyl esters (LOVAZA) 1 G capsule Take 1 g by mouth  daily before breakfast.     . pantoprazole (PROTONIX) 40 MG tablet Take 40 mg by mouth daily before breakfast.     . selegiline (EMSAM) 6 MG/24HR Place 1 patch onto the skin daily.     . tamsulosin (FLOMAX) 0.4 MG CAPS capsule Take 1 capsule (0.4 mg total) by mouth daily after supper. 30 capsule 0  . TOUJEO SOLOSTAR 300 UNIT/ML SOPN Inject 80 Units into the skin daily before breakfast.   0  . vitamin E 400 UNIT capsule Take 400 Units by mouth daily.    . zaleplon (SONATA) 10 MG capsule Take 10 mg by mouth at bedtime. May take an additional 10mg  by mouth if awakens during the night  0     Discharge Medications: Please see discharge summary for a list of discharge medications.  Relevant Imaging Results:  Relevant Lab Results:   Additional Information [id number]   Patient is [place]   Judeth Horn, [place]

## 2015-02-07 NOTE — ED Notes (Signed)
Social worker returned and at bedside.

## 2015-02-07 NOTE — Evaluation (Signed)
Physical Therapy Evaluation Patient Details Name: Victor [name] MRN: [id number] DOB: 10/28/1960 Today's Date: 02/07/2015   History of Present Illness  Pt admitted to ER due to pain and difficulty mobilizing  Clinical Impression  Pt presents to PT able to mobilize at a supervision level and is at the same functional level now as when he was last treated by PT on 02/05/15. Do not feel pt requires ongoing PT as when his pain is under control he is able to mobilize safely, though at a slow and guarded pace. Pt in agreement that he does not need HHPT follow-up at this point. Encouraged pt to continue to ambulate as pain allows.  Educated pt to sit in chairs with armrests to make transfers easier.  Findings discussed with CSW.  Pt/wife with no further questions/concerns for PT at this time.        Follow Up Recommendations No PT follow up    Equipment Recommendations       Recommendations for Other Services       Precautions / Restrictions Precautions Precautions: Back;Fall Restrictions Weight Bearing Restrictions: No      Mobility  Bed Mobility Overal bed mobility:  (NT, pt standing when PT arrived)                Transfers Overall transfer level: Needs assistance Equipment used: Rolling walker (2 wheeled) Transfers: Sit to/from Stand Sit to Stand: Supervision         General transfer comment: Used correct technique. Performed twice  Ambulation/Gait Ambulation/Gait assistance: Supervision Ambulation Distance (Feet): 30 Feet (performed twice) Assistive device: Rolling walker (2 wheeled) Gait Pattern/deviations: Decreased stride length     General Gait Details: very slow and gairded gait  Stairs            Wheelchair Mobility    Modified Rankin (Stroke Patients Only)       Balance                                             Pertinent Vitals/Pain Pain Assessment: 0-10 Pain Score: 8  Pain Location: back Pain Descriptors /  Indicators: Constant Pain Intervention(s): Limited activity within patient's tolerance;Monitored during session;Premedicated before session    Home Living Family/patient expects to be discharged to:: Private residence Living Arrangements: Spouse/significant other Available Help at Discharge: Family;Available 24 hours/day Type of Home: House Home Access: Stairs to enter Entrance Stairs-Rails: Right;Left;Can reach both Entrance Stairs-Number of Steps: 6 Home Layout: Two level Home Equipment: Walker - 2 wheels;Cane - single point      Prior Function Level of Independence: Needs assistance   Gait / Transfers Assistance Needed: Ambulates with walking stick or RW           Hand Dominance   Dominant Hand: Right    Extremity/Trunk Assessment   Upper Extremity Assessment: Overall WFL for tasks assessed           Lower Extremity Assessment: Generalized weakness      Cervical / Trunk Assessment: Normal  Communication   Communication: No difficulties  Cognition Arousal/Alertness: Awake/alert Behavior During Therapy: Flat affect Overall Cognitive Status: Within Functional Limits for tasks assessed                      General Comments General comments (skin integrity, edema, etc.): Wife present. Pt reports he xis going home as he  can not afford to pay to go to a NH.     Exercises        Assessment/Plan    PT Assessment Patent does not need any further PT services  PT Diagnosis Difficulty walking;Acute pain   PT Problem List    PT Treatment Interventions     PT Goals (Current goals can be found in the Care Plan section)      Frequency     Barriers to discharge        Co-evaluation               End of Session Equipment Utilized During Treatment: Gait belt Activity Tolerance: Patient tolerated treatment well Patient left: in chair;with call bell/phone within reach;with family/visitor present Nurse Communication: Mobility status     Functional Assessment Tool Used: clinical judgement Functional Limitation: Mobility: Walking and moving around Mobility: Walking and Moving Around Current Status [id number]): At least 1 percent but less than 20 percent impaired, limited or restricted Mobility: Walking and Moving Around Goal Status [phone]): At least 1 percent but less than 20 percent impaired, limited or restricted Mobility: Walking and Moving Around Discharge Status [phone]): At least 1 percent but less than 20 percent impaired, limited or restricted    Time: 1343-1405 PT Time Calculation (min) (ACUTE ONLY): 22 min   Charges:   PT Evaluation $Initial PT Evaluation Tier I: 1 Procedure     PT G Codes:   PT G-Codes **NOT FOR INPATIENT CLASS** Functional Assessment Tool Used: clinical judgement Functional Limitation: Mobility: Walking and moving around Mobility: Walking and Moving Around Current Status [id number]): At least 1 percent but less than 20 percent impaired, limited or restricted Mobility: Walking and Moving Around Goal Status [phone]): At least 1 percent but less than 20 percent impaired, limited or restricted Mobility: Walking and Moving Around Discharge Status [phone]): At least 1 percent but less than 20 percent impaired, limited or restricted    Melvern Banker 02/07/2015, 2:20 PM  Lavonia Dana, [place] 02/07/2015

## 2015-02-07 NOTE — Progress Notes (Signed)
Entered in d/c instructions  Please use the list of private duty nursing provided to you by case manager to assist with CNA, sitter as needed Call on 02/07/2015 As needed If you have further questions case manager can be reached at [phone]

## 2015-02-07 NOTE — ED Notes (Signed)
Wife at bedside talking to case manager on the phone

## 2015-02-07 NOTE — ED Notes (Signed)
Dr. Brooks at bedside.

## 2015-02-07 NOTE — ED Notes (Signed)
Patient drinking medication then will administer enema. Wife at bedside.

## 2015-02-07 NOTE — ED Notes (Signed)
Patient assisted to bedside commode in an attempt to have a bowel movement.

## 2015-02-17 NOTE — Discharge Summary (Signed)
Physician Discharge Summary  Patient ID: Victor [name] MRN: [id number] DOB/AGE: 1960/11/26 55 y.o.  Admit date: 02/02/2015 Discharge date: 02/17/2015  Admission Diagnoses:  LBP and radicular leg pain  Discharge Diagnoses:  Active Problems:   Chronic pain   Past Medical History  Diagnosis Date  . Hepatitis B virus infection 03/2007  . Thrombocytopenia ([place])   . Diabetes mellitus     Type II  . Hyperlipidemia   . GERD (gastroesophageal reflux disease)   . Osteoarthritis   . Depression   . Recurrent upper respiratory infection (URI)     head cold last week- no fever- states resolved  . Blood transfusion     platelets  . Chronic kidney disease     kidney stones 2012  . Hepatitis     hepatitis b/ newly diagnosed with portal hypertension  . Splenomegaly     LOV Dr Lamonte Sakai  12/12 on chart  . Hypertension   . Anxiety     h/o panic attacks   . Obstructive sleep apnea (adult) (pediatric)     mild with AHI 11.61/hr now on CPAP at 15cm h2o, CPAP is used q night   . Sciatica     lumbar region - 2010, also has had numerous injections   . Blood dyscrasia     thrombocytopenia     Surgeries: Procedure(s): LUMBAR SPINAL CORD STIMULATOR INSERTION on 02/02/2015   Consultants (if any): Treatment Team:  Carolan Clines, MD  Discharged Condition: Improved  Hospital Course: Victor [name] is an 55 y.o. male who was admitted 02/02/2015 with a diagnosis of LBP and radicular leg pain.  Pt went to the operating room on 02/02/2015 and underwent the above named procedures.  The pt is on chronic narcotics.  Pain was controlled while inpatient.  Foley was reinserted because of difficulty with urination.  Urology was consulted.  Pt was discharged on 02/05/15.  He was given perioperative antibiotics:  Anti-infectives    Start     Dose/Rate Route Frequency Ordered Stop   02/02/15 2230  ceFAZolin (ANCEF) IVPB 1 g/50 mL premix     1 g 100 mL/hr over 30 Minutes Intravenous Every 8 hours 02/02/15  2004 02/03/15 0639   02/02/15 2200  amoxicillin (AMOXIL) capsule 500 mg  Status:  Discontinued     500 mg Oral 4 times daily 02/02/15 2004 02/05/15 1623   02/02/15 1200  ceFAZolin (ANCEF) 3 g in dextrose 5 % 50 mL IVPB     3 g 160 mL/hr over 30 Minutes Intravenous To [facility] 02/01/15 0808 02/02/15 1437    .  He was given sequential compression devices, early ambulation, and TED for DVT prophylaxis.  He benefited maximally from the hospital stay and there were no complications.    Recent vital signs:  Filed Vitals:   02/05/15 0555 02/05/15 1010  BP: 128/76 122/62  Pulse: 82 93  Temp: 98.3 F (36.8 C)   Resp: 18 20    Recent laboratory studies:  Lab Results  Component Value Date   HGB 12.6* 02/07/2015   HGB 12.0* 02/07/2015   HGB 14.2 01/31/2015   Lab Results  Component Value Date   WBC 3.7* 02/07/2015   PLT 59* 02/07/2015   Lab Results  Component Value Date   INR 1.15 05/27/2012   Lab Results  Component Value Date   NA 138 02/07/2015   K 3.3* 02/07/2015   CL 94* 02/07/2015   CO2 27 01/31/2015   BUN 11 02/07/2015  CREATININE 0.70 02/07/2015   GLUCOSE 188* 02/07/2015    Discharge Medications:     Medication List    TAKE these medications        alprazolam 2 MG tablet  Commonly known as:  XANAX  Take 2 mg by mouth 3 (three) times daily as needed for anxiety.     amoxicillin 500 MG tablet  Commonly known as:  AMOXIL  Take 500 mg by mouth 4 (four) times daily.     atenolol 25 MG tablet  Commonly known as:  TENORMIN  Take 25 mg by mouth every morning.     atorvastatin 40 MG tablet  Commonly known as:  LIPITOR  Take 40 mg by mouth daily before breakfast.     HYDROmorphone 8 MG tablet  Commonly known as:  DILAUDID  Take 8 mg by mouth every 6 (six) hours as needed for moderate pain or severe pain.     lisinopril 2.5 MG tablet  Commonly known as:  PRINIVIL,ZESTRIL  Take 2.5 mg by mouth daily.     metFORMIN 1000 MG tablet  Commonly  known as:  GLUCOPHAGE  Take 1,000 mg by mouth 2 (two) times daily with a meal.     morphine 30 MG 12 hr tablet  Commonly known as:  MS CONTIN  Take 30 mg by mouth every 12 (twelve) hours.     omega-3 acid ethyl esters 1 g capsule  Commonly known as:  LOVAZA  Take 1 g by mouth daily before breakfast.     pantoprazole 40 MG tablet  Commonly known as:  PROTONIX  Take 40 mg by mouth daily before breakfast.     selegiline 6 MG/24HR  Commonly known as:  EMSAM  Place 1 patch onto the skin daily.     tamsulosin 0.4 MG Caps capsule  Commonly known as:  FLOMAX  Take 1 capsule (0.4 mg total) by mouth daily after supper.     TANZEUM 50 MG Pen  Generic drug:  Albiglutide  Inject 50 mg into the skin once a week.     TOUJEO SOLOSTAR 300 UNIT/ML Sopn  Generic drug:  Insulin Glargine  Inject 80 Units into the skin daily before breakfast.     vitamin E 400 UNIT capsule  Take 400 Units by mouth daily.     zaleplon 10 MG capsule  Commonly known as:  SONATA  Take 10 mg by mouth at bedtime. May take an additional 10mg  by mouth if awakens during the night        Diagnostic Studies: Dg Thoracolumabar Spine  02/02/2015  CLINICAL DATA:  Lumbar spinal cord stimulator placement. EXAM: DG C-ARM 61-120 MIN; THORACOLUMBAR SPINE - 2 VIEW COMPARISON:  None. FINDINGS: Two intraoperative spot images demonstrate spinal stimulator wires projecting over the lower thoracic spinal canal. IMPRESSION: Placement of spinal stimulator device in the lower thoracic region. Electronically Signed   By: Rolm Baptise M.D.   On: 02/02/2015 17:16   Dg C-arm 1-60 Min  02/02/2015  CLINICAL DATA:  Lumbar spinal cord stimulator placement. EXAM: DG C-ARM 61-120 MIN; THORACOLUMBAR SPINE - 2 VIEW COMPARISON:  None. FINDINGS: Two intraoperative spot images demonstrate spinal stimulator wires projecting over the lower thoracic spinal canal. IMPRESSION: Placement of spinal stimulator device in the lower thoracic region.  Electronically Signed   By: Rolm Baptise M.D.   On: 02/02/2015 17:16    Disposition: 01-Home or Self Care        Follow-up Information    Follow up with  Dahlia Bailiff, MD.   Specialty:  Orthopedic Surgery   Why:  [place] scheduled appt. for 02/25/15 at 11:15am   Contact information:   [address] [place] [address] [id number]        Signed: Valinda Hoar 02/17/2015, 10:09 AM

## 2015-03-16 DIAGNOSIS — A0472 Enterocolitis due to Clostridium difficile, not specified as recurrent: Secondary | ICD-10-CM

## 2015-03-16 HISTORY — DX: Enterocolitis due to Clostridium difficile, not specified as recurrent: A04.72

## 2015-04-06 ENCOUNTER — Ambulatory Visit: Payer: Self-pay | Admitting: General Surgery

## 2015-04-06 MED ORDER — SODIUM CHLORIDE 0.9 % IV SOLN: Freq: Once | INTRAVENOUS | Status: AC

## 2015-04-06 NOTE — H&P (Signed)
History of Present Illness Victor Ok MD; 04/06/2015 1:53 PM) Patient words: Eval. gallbladder.  The patient is a 55 year old male who presents for evaluation of gall stones. The patient is a 55 year old male who is referred by Dr. Antony Contras for in evaluation of gallstones and biliary pancreatitis. Patient states he was aware of gallstones. He states that last week he was hospitalized secondary to nausea and vomiting and was shown to have biliary pancreatitis secondary to CT scan which revealed fluid around the pancreas.  Patient states he began with abdominal pain near the right upper quadrant area. He states this is approximate 2 AM. This followed nausea vomiting continued pain. Patient was evaluated year thereafter.  Patient has history of back pain and is currently on a pain contract. He recently had a back similar placed by Dr. Rolena Infante. Patient does have a history of thrombocytopenia. Patient has had platelets on hold for surgery previous operations.   Other Problems Lars Mage Spillers, CMA; 04/06/2015 1:35 PM) Anxiety Disorder Arthritis Back Pain Cholelithiasis Diabetes Mellitus Gastroesophageal Reflux Disease Hepatitis High blood pressure Hypercholesterolemia Kidney Stone Pancreatitis Sleep Apnea  Past Surgical History Lars Mage Spillers, CMA; 04/06/2015 1:35 PM) Knee Surgery Bilateral. Shoulder Surgery Left.  Diagnostic Studies History Illene Regulus, CMA; 04/06/2015 1:35 PM) Colonoscopy 1-5 years ago  Allergies Lars Mage Spillers, CMA; 04/06/2015 1:37 PM) Cyclobenzaprine *CHEMICALS* Meperidine HCl *ANALGESICS - OPIOID* Vicodin *ANALGESICS - OPIOID*  Medication History (Alisha Spillers, CMA; 04/06/2015 1:43 PM) ALPRAZolam (2MG  Tablet, Oral) Active. Atenolol (25MG  Tablet, Oral) Active. Atorvastatin Calcium (40MG  Tablet, Oral) Active. Lisinopril (2.5MG  Tablet, Oral) Active. MetFORMIN HCl (1000MG  Tablet, Oral) Active. Pantoprazole Sodium  (40MG  Tablet DR, Oral) Active. Emsam (6MG /24HR Patch 24HR, Transdermal) Active. Zaleplon (10MG  Capsule, Oral) Active. Toujeo SoloStar (300UNIT/ML Soln Pen-inj, Subcutaneous) Active. Fish Oil (1000MG  Capsule DR, Oral) Active. Vitamin E (400UNIT Tablet, Oral) Active. Medications Reconciled  Social History Illene Regulus, CMA; 04/06/2015 1:35 PM) Caffeine use Carbonated beverages. No alcohol use No drug use Tobacco use Former smoker.  Family History Illene Regulus, CMA; 04/06/2015 1:35 PM) Alcohol Abuse Brother, Father, Sister. Cerebrovascular Accident Father. Colon Cancer Father. Hypertension Father. Respiratory Condition Mother.    Review of Systems Lars Mage Spillers CMA; 04/06/2015 1:35 PM) General Present- Appetite Loss, Chills and Fatigue. Not Present- Fever, Night Sweats, Weight Gain and Weight Loss. Skin Not Present- Change in Wart/Mole, Dryness, Hives, Jaundice, New Lesions, Non-Healing Wounds, Rash and Ulcer. HEENT Present- Wears glasses/contact lenses. Not Present- Earache, Hearing Loss, Hoarseness, Nose Bleed, Oral Ulcers, Ringing in the Ears, Seasonal Allergies, Sinus Pain, Sore Throat, Visual Disturbances and Yellow Eyes. Respiratory Not Present- Bloody sputum, Chronic Cough, Difficulty Breathing, Snoring and Wheezing. Breast Not Present- Breast Mass, Breast Pain, Nipple Discharge and Skin Changes. Cardiovascular Not Present- Chest Pain, Difficulty Breathing Lying Down, Leg Cramps, Palpitations, Rapid Heart Rate, Shortness of Breath and Swelling of Extremities. Gastrointestinal Present- Abdominal Pain and Bloating. Not Present- Bloody Stool, Change in Bowel Habits, Chronic diarrhea, Constipation, Difficulty Swallowing, Excessive gas, Gets full quickly at meals, Hemorrhoids, Indigestion, Nausea, Rectal Pain and Vomiting. Male Genitourinary Not Present- Blood in Urine, Change in Urinary Stream, Frequency, Impotence, Nocturia, Painful Urination, Urgency and Urine  Leakage. Musculoskeletal Present- Back Pain, Joint Pain and Muscle Pain. Not Present- Joint Stiffness, Muscle Weakness and Swelling of Extremities. Neurological Not Present- Decreased Memory, Fainting, Headaches, Numbness, Seizures, Tingling, Tremor, Trouble walking and Weakness. Psychiatric Present- Anxiety. Not Present- Bipolar, Change in Sleep Pattern, Depression, Fearful and Frequent crying. Endocrine Present- New Diabetes. Not Present- Cold Intolerance, Excessive Hunger,  Hair Changes, Heat Intolerance and Hot flashes. Hematology Not Present- Easy Bruising, Excessive bleeding, Gland problems, HIV and Persistent Infections.  Vitals (Alisha Spillers CMA; 04/06/2015 1:36 PM) 04/06/2015 1:36 PM Weight: 283 lb Height: 72in Body Surface Area: 2.47 m Body Mass Index: 38.38 kg/m  Pulse: 76 (Regular)  BP: 140/82 (Sitting, Left Arm, Standard)       Physical Exam Victor Ok, MD; 04/06/2015 1:54 PM) General Mental Status-Alert. General Appearance-Consistent with stated age. Hydration-Well hydrated. Voice-Normal.  Head and Neck Head-normocephalic, atraumatic with no lesions or palpable masses.  Eye Eyeball - Bilateral-Extraocular movements intact. Sclera/Conjunctiva - Bilateral-No scleral icterus.  Chest and Lung Exam Chest and lung exam reveals -quiet, even and easy respiratory effort with no use of accessory muscles. Inspection Chest Wall - Normal. Back - normal.  Cardiovascular Cardiovascular examination reveals -normal heart sounds, regular rate and rhythm with no murmurs.  Abdomen Inspection Normal Exam - No Hernias. Palpation/Percussion Normal exam - Soft, Non Tender, No Rebound tenderness, No Rigidity (guarding) and No hepatosplenomegaly. Auscultation Normal exam - Bowel sounds normal.  Neurologic Neurologic evaluation reveals -alert and oriented x 3 with no impairment of recent or remote memory. Mental  Status-Normal.  Musculoskeletal Normal Exam - Left-Upper Extremity Strength Normal and Lower Extremity Strength Normal. Normal Exam - Right-Upper Extremity Strength Normal, Lower Extremity Weakness.    Assessment & Plan Victor Ok MD; 04/06/2015 1:53 PM) SYMPTOMATIC CHOLELITHIASIS (K80.20) Impression: 55 year old male with cholelithiasis and subsequently biliary pancreatitis.  1. We will proceed to the operating room for a laparoscopic cholecystectomy. We will have platelets on standby prior to surgery. 2. Risks and benefits were discussed with the patient to generally include, but not limited to: infection, bleeding, possible need for post op ERCP, damage to the bile ducts, bile leak, and possible need for further surgery. Alternatives were offered and described. All questions were answered and the patient voiced understanding of the procedure and wishes to proceed at this point with a laparoscopic cholecystectomy

## 2015-04-22 ENCOUNTER — Encounter (HOSPITAL_COMMUNITY): Payer: Self-pay

## 2015-04-22 ENCOUNTER — Encounter (HOSPITAL_COMMUNITY)
Admission: RE | Admit: 2015-04-22 | Discharge: 2015-04-22 | Disposition: A | Discharge status: Home / Self Care | Payer: BLUE CROSS/BLUE SHIELD | Source: Ambulatory Visit | Attending: General Surgery | Admitting: General Surgery

## 2015-04-22 DIAGNOSIS — F329 Major depressive disorder, single episode, unspecified: Secondary | ICD-10-CM | POA: Insufficient documentation

## 2015-04-22 DIAGNOSIS — F419 Anxiety disorder, unspecified: Secondary | ICD-10-CM | POA: Diagnosis not present

## 2015-04-22 DIAGNOSIS — Z79899 Other long term (current) drug therapy: Secondary | ICD-10-CM | POA: Insufficient documentation

## 2015-04-22 DIAGNOSIS — K8020 Calculus of gallbladder without cholecystitis without obstruction: Secondary | ICD-10-CM | POA: Insufficient documentation

## 2015-04-22 DIAGNOSIS — E785 Hyperlipidemia, unspecified: Secondary | ICD-10-CM | POA: Insufficient documentation

## 2015-04-22 DIAGNOSIS — I10 Essential (primary) hypertension: Secondary | ICD-10-CM | POA: Insufficient documentation

## 2015-04-22 DIAGNOSIS — Z01812 Encounter for preprocedural laboratory examination: Secondary | ICD-10-CM | POA: Diagnosis not present

## 2015-04-22 DIAGNOSIS — Z7984 Long term (current) use of oral hypoglycemic drugs: Secondary | ICD-10-CM | POA: Diagnosis not present

## 2015-04-22 DIAGNOSIS — D696 Thrombocytopenia, unspecified: Secondary | ICD-10-CM | POA: Insufficient documentation

## 2015-04-22 DIAGNOSIS — G4733 Obstructive sleep apnea (adult) (pediatric): Secondary | ICD-10-CM | POA: Insufficient documentation

## 2015-04-22 DIAGNOSIS — Z01818 Encounter for other preprocedural examination: Secondary | ICD-10-CM | POA: Insufficient documentation

## 2015-04-22 DIAGNOSIS — K219 Gastro-esophageal reflux disease without esophagitis: Secondary | ICD-10-CM | POA: Diagnosis not present

## 2015-04-22 DIAGNOSIS — K766 Portal hypertension: Secondary | ICD-10-CM | POA: Insufficient documentation

## 2015-04-22 DIAGNOSIS — Z87891 Personal history of nicotine dependence: Secondary | ICD-10-CM | POA: Insufficient documentation

## 2015-04-22 DIAGNOSIS — E119 Type 2 diabetes mellitus without complications: Secondary | ICD-10-CM | POA: Insufficient documentation

## 2015-04-22 DIAGNOSIS — R161 Splenomegaly, not elsewhere classified: Secondary | ICD-10-CM | POA: Insufficient documentation

## 2015-04-22 HISTORY — DX: Acute pancreatitis without necrosis or infection, unspecified: K85.90

## 2015-04-22 HISTORY — DX: Personal history of other infectious and parasitic diseases: Z86.19

## 2015-04-22 HISTORY — DX: Enterocolitis due to Clostridium difficile, not specified as recurrent: A04.72

## 2015-04-22 LAB — BASIC METABOLIC PANEL
Anion gap: 10 (ref 5–15)
BUN: 11 mg/dL (ref 6–20)
CO2: 23 mmol/L (ref 22–32)
Calcium: 9.4 mg/dL (ref 8.9–10.3)
Chloride: 108 mmol/L (ref 101–111)
Creatinine, Ser: 0.76 mg/dL (ref 0.61–1.24)
GFR calc Af Amer: >60 mL/min (ref >60)
GFR calc non Af Amer: >60 mL/min (ref >60)
Glucose, Bld: 116 mg/dL — ABNORMAL HIGH (ref 65–99)
Potassium: 3.9 mmol/L (ref 3.5–5.1)
Sodium: 141 mmol/L (ref 135–145)

## 2015-04-22 LAB — CBC WITH DIFFERENTIAL/PLATELET
Basophils Absolute: 0 10*3/uL (ref 0.0–0.1)
Basophils Relative: 0 %
Eosinophils Absolute: 0.1 10*3/uL (ref 0.0–0.7)
Eosinophils Relative: 4 %
HCT: 38 % — ABNORMAL LOW (ref 39.0–52.0)
Hemoglobin: 12.6 g/dL — ABNORMAL LOW (ref 13.0–17.0)
Lymphocytes Relative: 35 %
Lymphs Abs: 1.2 10*3/uL (ref 0.7–4.0)
MCH: 27.9 pg (ref 26.0–34.0)
MCHC: 33.2 g/dL (ref 30.0–36.0)
MCV: 84.3 fL (ref 78.0–100.0)
Monocytes Absolute: 0.3 10*3/uL (ref 0.1–1.0)
Monocytes Relative: 10 %
Neutro Abs: 1.8 10*3/uL (ref 1.7–7.7)
Neutrophils Relative %: 51 %
Platelets: 54 10*3/uL — ABNORMAL LOW (ref 150–400)
RBC: 4.51 MIL/uL (ref 4.22–5.81)
RDW: 14.6 % (ref 11.5–15.5)
WBC: 3.5 10*3/uL — ABNORMAL LOW (ref 4.0–10.5)

## 2015-04-22 LAB — GLUCOSE, CAPILLARY: Glucose-Capillary: 98 mg/dL (ref 65–99)

## 2015-04-22 MED ORDER — CHLORHEXIDINE GLUCONATE 4 % EX LIQD: 1.0000 "application " | Freq: Once | CUTANEOUS | Status: DC

## 2015-04-22 NOTE — Pre-Procedure Instructions (Signed)
PRINSTON PIANKA  04/22/2015      [facility], [place] - [place] [address] Phone: [phone] Fax: [phone]    Your procedure is scheduled on Tues, Mar 14 @ 10:55 AM  Report to [facility] Admitting at 8:45 AM  Call this number if you have problems the morning of surgery:  [phone]   Remember:  Do not eat food or drink liquids after midnight.  Take these medicines the morning of surgery with A SIP OF WATER Alprazolam(Xanax),Atenolol(Tenormin),Pain Pill(if needed),and Pantoprazole(Protonix)               Stop taking your Lovaza. No Goody's,BC's,Aleve,Aspirin,Ibuprofen,Motrin,Advil,Fish Oil,or any Herbal Medications.   How to Manage Your Diabetes Before Surgery   Why is it important to control my blood sugar before and after surgery?   Improving blood sugar levels before and after surgery helps healing and can limit problems.  A way of improving blood sugar control is eating a healthy diet by:  - Eating less sugar and carbohydrates  - Increasing activity/exercise  - Talk with your doctor about reaching your blood sugar goals  High blood sugars (greater than 180 mg/dL) can raise your risk of infections and slow down your recovery so you will need to focus on controlling your diabetes during the weeks before surgery.  Make sure that the doctor who takes care of your diabetes knows about your planned surgery including the date and location.  How do I manage my blood sugars before surgery?   Check your blood sugar at least 4 times a day, 2 days before surgery to make sure that they are not too high or low.   Check your blood sugar the morning of your surgery when you wake up and every 2               hours until you get to the Short-Stay unit.  If your blood sugar is less than 70 mg/dL, you will need to treat for low blood sugar by:  Treat a low blood sugar (less than 70  mg/dL) with 1/2 cup of clear juice (cranberry or apple), 4 glucose tablets, OR glucose gel.  Recheck blood sugar in 15 minutes after treatment (to make sure it is greater than 70 mg/dL).  If blood sugar is not greater than 70 mg/dL on re-check, call [phone] for further instructions.   Report your blood sugar to the Short-Stay nurse when you get to Short-Stay.  References:  [facility], 2007 "How to Manage your Diabetes Before and After Surgery".  What do I do about my diabetes medications?   Do not take oral diabetes medicines (pills) the morning of surgery.      THE MORNING OF SURGERY, take 40 units of Toujeo Insulin.    Do not take other diabetes injectables the day of surgery including Byetta, Victoza, Bydureon, and Trulicity.    If your CBG is greater than 220 mg/dL, you may take 1/2 of your sliding scale (correction) dose of insulin.   For patients with "Insulin Pumps":  Contact your diabetes doctor for specific instructions before surgery.   Decrease basal insulin rates by 20% at midnight the night before surgery.  Note that if your surgery is planned to be longer than 2 hours, your insulin pump will be removed and intravenous (IV) insulin will be started and managed by the nurses and anesthesiologist.  You will  be able to restart your insulin pump once you are awake and able to manage it.  Make sure to bring insulin pump supplies to the hospital with you in case your site needs to be changed.         Do not wear jewelry.  Do not wear lotions, powders, or colognes.  You may wear deodorant.  Men may shave face and neck.  Do not bring valuables to the hospital.  [facility] is not responsible for any belongings or valuables.  Contacts, dentures or bridgework may not be worn into surgery.  Leave your suitcase in the car.  After surgery it may be brought to your room.  For patients admitted to the hospital, discharge time will be  determined by your treatment team.  Patients discharged the day of surgery will not be allowed to drive home.    Special instructions:  [facility] - Preparing for Surgery  Before surgery, you can play an important role.  Because skin is not sterile, your skin needs to be as free of germs as possible.  You can reduce the number of germs on you skin by washing with CHG (chlorahexidine gluconate) soap before surgery.  CHG is an antiseptic cleaner which kills germs and bonds with the skin to continue killing germs even after washing.  Please DO NOT use if you have an allergy to CHG or antibacterial soaps.  If your skin becomes reddened/irritated stop using the CHG and inform your nurse when you arrive at Short Stay.  Do not shave (including legs and underarms) for at least 48 hours prior to the first CHG shower.  You may shave your face.  Please follow these instructions carefully:   1.  Shower with CHG Soap the night before surgery and the                                morning of Surgery.  2.  If you choose to wash your hair, wash your hair first as usual with your       normal shampoo.  3.  After you shampoo, rinse your hair and body thoroughly to remove the                      Shampoo.  4.  Use CHG as you would any other liquid soap.  You can apply chg directly       to the skin and wash gently with scrungie or a clean washcloth.  5.  Apply the CHG Soap to your body ONLY FROM THE NECK DOWN.        Do not use on open wounds or open sores.  Avoid contact with your eyes,       ears, mouth and genitals (private parts).  Wash genitals (private parts)       with your normal soap.  6.  Wash thoroughly, paying special attention to the area where your surgery        will be performed.  7.  Thoroughly rinse your body with warm water from the neck down.  8.  DO NOT shower/wash with your normal soap after using and rinsing off       the CHG Soap.  9.  Pat yourself dry with a clean towel.            10.   Wear clean pajamas.  11.  Place clean sheets on your bed the night of your first shower and do not        sleep with pets.  Day of Surgery  Do not apply any lotions/deoderants the morning of surgery.  Please wear clean clothes to the hospital/surgery center.    Please read over the following fact sheets that you were given. Pain Booklet, Coughing and Deep Breathing and Surgical Site Infection Prevention

## 2015-04-22 NOTE — Progress Notes (Addendum)
Cardiologist is Dr.Traci Turner,last visit in epic from 11-29-14  Echo denies  Stress test denies  Heart cath denies  EKG in epic from 05-19-14  CXR denies in past yr  Medical Md is Dr.David Oceanographer

## 2015-04-23 LAB — HEMOGLOBIN A1C
Hgb A1c MFr Bld: 7.3 % — ABNORMAL HIGH (ref 4.8–5.6)
Mean Plasma Glucose: 163 mg/dL

## 2015-04-25 MED ORDER — DEXTROSE 5 % IV SOLN
3.0000 g | INTRAVENOUS | Status: AC
  Administered 2015-04-26: 3 g via INTRAVENOUS
  Filled 2015-04-25 (×2): qty 3000

## 2015-04-25 NOTE — Progress Notes (Signed)
Per Amy, LPN at Dr. Rosendo Gros office patient needs to have 2 units of platelets on standby order placed for same.

## 2015-04-25 NOTE — Progress Notes (Signed)
Anesthesia Chart Review: Pt is 55 year old male scheduled for laparoscopic cholecystectomy on 04/26/2015 by Dr. Rosendo Gros.  PMH includes:hospitalization [facility], [place]) acute pancreatitis (possible gallstone related) 03/2015, HTN, DM2, hyperlipidemia, hepatits B, portal hypertension with splenomegaly and thrombocytopenia, OSA (on CPAP), splenomegaly, GERD, anxiety, depression,  C. Difficile colitis 03/2015, former smoker, L5-S1 microdiscectomy and L5 hemilaminectomy '13, spinal cord stimulator 02/02/15. BMI 38, consistent with obesity.   PCP is Dr. Antony Contras. Sees cardiologist Dr. Fransico Him for OSA and HTN. Endocrinologist is Dr. Francetta Found ([facility]), last visit 04/19/15.   Medications include: Xanax, atenolol, Lipitor, hydromorphone, lisinopril, metformin, MS Contin, Lovaza, Protonix, selegiline 6mg /24 hr patch (MAO inhibitor), Flomax, Toujeo, Sonata. (Prior to his 01/2015 surgery, Dr. Jenita Seashore had patient keep on his old selegiline patch for the day of surgery and replace with a new patch after his surgery is over.)  EKG 05/19/14 ([facility]): unusual P axis, probably ectopic atrial rhythm, normal axis, no ST changes.   Preoperative labs noted. A1c 7.3. Cr 0.76. HFP note done (will plan for DOS due to history of hepatitis B). H/H 12.6/38.0. PLT count 54K (previously 59K 02/07/15, 56K 02/10/15, 58K 05/27/12). Reported that Dr. Moreen Fowler monitors his thrombocytopenia. Needs T&S and HFP on the day of surgery. PLT count called to [facility] at Dr. Rosendo Gros' office. Amy returned my call. She said Dr. Rosendo Gros is planning to have 2 Units platelets on standby. I also asked that she make sure he is aware that patient is on a MAO inhibitor. (FYI: For his 02/02/15 surgery, he was given 1 Unit PLT in Holding and another unit intra-operatively.)  Above reviewed with anesthesiologist Dr. Smith Robert. Okay to continue selegiline patch instructions as done in 01/2015 (patient notified). He would also recommend  repeating CBC tomorrow as well to help determine if PLT will need to be transfused. Henrine Screws, RN contacted Dr. Gevena Cotton' office for platelet order.)  Myra Gianotti, [facility] Short Stay Center/Anesthesiology Phone [phone] 04/25/2015 2:51 PM

## 2015-04-25 NOTE — Anesthesia Preprocedure Evaluation (Addendum)
Anesthesia Evaluation  Patient identified by MRN, date of birth, ID band Patient awake    Reviewed: Allergy & Precautions, NPO status , Patient's Chart, lab work & pertinent test results  Airway Mallampati: III  TM Distance: >3 FB Neck ROM: Full    Dental   Pulmonary sleep apnea and Continuous Positive Airway Pressure Ventilation , former smoker,    Pulmonary exam normal breath sounds clear to auscultation       Cardiovascular hypertension, Pt. on medications Normal cardiovascular exam Rhythm:Regular Rate:Normal     Neuro/Psych Anxiety Depression  Neuromuscular disease    GI/Hepatic (+) Hepatitis -, B  Endo/Other  diabetes  Renal/GU      Musculoskeletal  (+) Arthritis ,   Abdominal   Peds  Hematology  (+) Blood dyscrasia, , thrombocytopenia   Anesthesia Other Findings Does not feel his spinal cord stimulator and is unsure if it is working properly, does not have remote to de-activate device if it was on.. Recommend short intermittent use of bipolar electrocautery to not affect device intraoperatively  Patient has significant pain requirements of PO dilaudid 8mg  QID in addition to MS contin 30mg  BID  Reproductive/Obstetrics                          Anesthesia Physical Anesthesia Plan  ASA: III  Anesthesia Plan: General   Post-op Pain Management:    Induction: Intravenous  Airway Management Planned: Oral ETT and Video Laryngoscope Planned  Additional Equipment:   Intra-op Plan:   Post-operative Plan: Extubation in OR  Informed Consent: I have reviewed the patients History and Physical, chart, labs and discussed the procedure including the risks, benefits and alternatives for the proposed anesthesia with the patient or authorized representative who has indicated his/her understanding and acceptance.   Dental advisory given  Plan Discussed with: Anesthesiologist, CRNA and  Surgeon  Anesthesia Plan Comments: (See my anesthesia note. Known hepatitis B with thrombocytopenia (PLT 50-60K). F/U labs ordered for 04/26/15. Surgeon is ordering platelets on standby. PATIENT ALSO ON A MAO INHIBITOR (selegiline patch). Myra Gianotti, PA-C  Will continue patch and avoid medications that can increase risk of serotonin syndrome like tramadol and meperidine in periop period Plan GA with ETT, spinal cord stimulator will be turned off for procedure to decrease risk of interference from electrocautery PLTs on standby, will see what platelet count is today to decide need to transfuse prior to surgery  Ketamine 0.5mg /kg on induction)     Anesthesia Quick Evaluation                                   Anesthesia Evaluation  Patient identified by MRN, date of birth, ID band Patient awake    Reviewed: Allergy & Precautions, NPO status , Patient's Chart, lab work & pertinent test results  Airway Mallampati: III  TM Distance: >3 FB Neck ROM: Full    Dental  (+) Teeth Intact   Pulmonary sleep apnea , former smoker,    breath sounds clear to auscultation       Cardiovascular hypertension, Pt. on medications and Pt. on home beta blockers  Rhythm:Regular Rate:Normal     Neuro/Psych PSYCHIATRIC DISORDERS Anxiety Depression  Neuromuscular disease    GI/Hepatic GERD  Medicated,(+) Hepatitis -, B  Endo/Other  diabetes, Type 2, Oral Hypoglycemic Agents  Renal/GU CRFRenal disease  negative genitourinary   Musculoskeletal  (+) Arthritis , Osteoarthritis,  Abdominal   Peds negative pediatric ROS (+)  Hematology negative hematology ROS (+)   Anesthesia Other Findings - Thrombocytopenia  Reproductive/Obstetrics negative OB ROS                           Lab Results  Component Value Date   WBC 3.5* 04/22/2015   HGB 12.6* 04/22/2015   HCT 38.0* 04/22/2015   MCV 84.3 04/22/2015   PLT 54* 04/22/2015   Lab Results  Component  Value Date   CREATININE 0.76 04/22/2015   BUN 11 04/22/2015   NA 141 04/22/2015   K 3.9 04/22/2015   CL 108 04/22/2015   CO2 23 04/22/2015   Lab Results  Component Value Date   INR 1.15 05/27/2012   INR 1.24 03/11/2012   INR 1.17 02/19/2011   05/2014: EKG: normal sinus rhythm, frequent PVC's noted.   Anesthesia Physical Anesthesia Plan  ASA: III  Anesthesia Plan: General   Post-op Pain Management:    Induction: Intravenous  Airway Management Planned: Oral ETT  Additional Equipment:   Intra-op Plan:   Post-operative Plan: Extubation in OR  Informed Consent: I have reviewed the patients History and Physical, chart, labs and discussed the procedure including the risks, benefits and alternatives for the proposed anesthesia with the patient or authorized representative who has indicated his/her understanding and acceptance.   Dental advisory given  Plan Discussed with: CRNA  Anesthesia Plan Comments:        Anesthesia Quick Evaluation                                    Anesthesia Evaluation  Patient identified by MRN, date of birth, ID band Patient awake    Reviewed: Allergy & Precautions, NPO status , Patient's Chart, lab work & pertinent test results  Airway Mallampati: III  TM Distance: >3 FB Neck ROM: Full    Dental  (+) Teeth Intact   Pulmonary sleep apnea , former smoker,    breath sounds clear to auscultation       Cardiovascular hypertension, Pt. on medications and Pt. on home beta blockers  Rhythm:Regular Rate:Normal     Neuro/Psych PSYCHIATRIC DISORDERS Anxiety Depression  Neuromuscular disease    GI/Hepatic GERD  Medicated,(+) Hepatitis -, B  Endo/Other  diabetes, Type 2, Oral Hypoglycemic Agents  Renal/GU CRFRenal disease  negative genitourinary   Musculoskeletal  (+) Arthritis , Osteoarthritis,    Abdominal   Peds negative pediatric ROS (+)  Hematology negative hematology ROS (+)   Anesthesia Other  Findings - Thrombocytopenia  Reproductive/Obstetrics negative OB ROS                           Lab Results  Component Value Date   WBC 3.5* 04/22/2015   HGB 12.6* 04/22/2015   HCT 38.0* 04/22/2015   MCV 84.3 04/22/2015   PLT 54* 04/22/2015   Lab Results  Component Value Date   CREATININE 0.76 04/22/2015   BUN 11 04/22/2015   NA 141 04/22/2015   K 3.9 04/22/2015   CL 108 04/22/2015   CO2 23 04/22/2015   Lab Results  Component Value Date   INR 1.15 05/27/2012   INR 1.24 03/11/2012   INR 1.17 02/19/2011   05/2014: EKG: normal sinus rhythm, frequent PVC's noted.   Anesthesia Physical Anesthesia Plan  ASA: III  Anesthesia  Plan: General   Post-op Pain Management:    Induction: Intravenous  Airway Management Planned: Oral ETT  Additional Equipment:   Intra-op Plan:   Post-operative Plan: Extubation in OR  Informed Consent: I have reviewed the patients History and Physical, chart, labs and discussed the procedure including the risks, benefits and alternatives for the proposed anesthesia with the patient or authorized representative who has indicated his/her understanding and acceptance.   Dental advisory given  Plan Discussed with: CRNA  Anesthesia Plan Comments:        Anesthesia Quick Evaluation

## 2015-04-26 ENCOUNTER — Ambulatory Visit (HOSPITAL_COMMUNITY): Payer: BLUE CROSS/BLUE SHIELD | Admitting: Vascular Surgery

## 2015-04-26 ENCOUNTER — Ambulatory Visit (HOSPITAL_COMMUNITY)
Admission: RE | Admit: 2015-04-26 | Discharge: 2015-04-26 | Disposition: A | Discharge status: Home / Self Care | Payer: BLUE CROSS/BLUE SHIELD | Source: Ambulatory Visit | Attending: General Surgery | Admitting: General Surgery

## 2015-04-26 ENCOUNTER — Encounter (HOSPITAL_COMMUNITY)
Admission: RE | Disposition: A | Discharge status: Home / Self Care | Payer: Self-pay | Source: Ambulatory Visit | Attending: General Surgery

## 2015-04-26 ENCOUNTER — Ambulatory Visit (HOSPITAL_COMMUNITY): Payer: BLUE CROSS/BLUE SHIELD | Admitting: Certified Registered Nurse Anesthetist

## 2015-04-26 ENCOUNTER — Encounter (HOSPITAL_COMMUNITY): Payer: Self-pay | Admitting: *Deleted

## 2015-04-26 DIAGNOSIS — G4730 Sleep apnea, unspecified: Secondary | ICD-10-CM | POA: Insufficient documentation

## 2015-04-26 DIAGNOSIS — E119 Type 2 diabetes mellitus without complications: Secondary | ICD-10-CM | POA: Insufficient documentation

## 2015-04-26 DIAGNOSIS — K7689 Other specified diseases of liver: Secondary | ICD-10-CM | POA: Insufficient documentation

## 2015-04-26 DIAGNOSIS — Z87891 Personal history of nicotine dependence: Secondary | ICD-10-CM | POA: Diagnosis not present

## 2015-04-26 DIAGNOSIS — K859 Acute pancreatitis without necrosis or infection, unspecified: Secondary | ICD-10-CM | POA: Diagnosis not present

## 2015-04-26 DIAGNOSIS — K8010 Calculus of gallbladder with chronic cholecystitis without obstruction: Secondary | ICD-10-CM | POA: Diagnosis present

## 2015-04-26 DIAGNOSIS — I10 Essential (primary) hypertension: Secondary | ICD-10-CM | POA: Insufficient documentation

## 2015-04-26 HISTORY — PX: CHOLECYSTECTOMY: SHX55

## 2015-04-26 LAB — HEPATIC FUNCTION PANEL
ALT: 25 U/L (ref 17–63)
AST: 27 U/L (ref 15–41)
Albumin: 3.6 g/dL (ref 3.5–5.0)
Alkaline Phosphatase: 65 U/L (ref 38–126)
Bilirubin, Direct: 0.2 mg/dL (ref 0.1–0.5)
Indirect Bilirubin: 0.7 mg/dL (ref 0.3–0.9)
Total Bilirubin: 0.9 mg/dL (ref 0.3–1.2)
Total Protein: 6.8 g/dL (ref 6.5–8.1)

## 2015-04-26 LAB — PREPARE PLATELET PHERESIS
Unit division: 0
Unit division: 0

## 2015-04-26 LAB — GLUCOSE, CAPILLARY
Glucose-Capillary: 115 mg/dL — ABNORMAL HIGH (ref 65–99)
Glucose-Capillary: 131 mg/dL — ABNORMAL HIGH (ref 65–99)

## 2015-04-26 LAB — TYPE AND SCREEN
ABO/RH(D): A POS
Antibody Screen: NEGATIVE

## 2015-04-26 LAB — CBC
HCT: 38.3 % — ABNORMAL LOW (ref 39.0–52.0)
Hemoglobin: 12.5 g/dL — ABNORMAL LOW (ref 13.0–17.0)
MCH: 27.7 pg (ref 26.0–34.0)
MCHC: 32.6 g/dL (ref 30.0–36.0)
MCV: 84.9 fL (ref 78.0–100.0)
Platelets: 61 10*3/uL — ABNORMAL LOW (ref 150–400)
RBC: 4.51 MIL/uL (ref 4.22–5.81)
RDW: 14.7 % (ref 11.5–15.5)
WBC: 4.2 10*3/uL (ref 4.0–10.5)

## 2015-04-26 SURGERY — LAPAROSCOPIC CHOLECYSTECTOMY
Anesthesia: General | Site: Abdomen

## 2015-04-26 MED ORDER — KETAMINE HCL 100 MG/ML IJ SOLN
INTRAMUSCULAR | Status: AC
  Filled 2015-04-26: qty 1

## 2015-04-26 MED ORDER — PROMETHAZINE HCL 25 MG/ML IJ SOLN: 6.2500 mg | INTRAMUSCULAR | Status: DC | PRN

## 2015-04-26 MED ORDER — PROPOFOL 10 MG/ML IV BOLUS
INTRAVENOUS | Status: DC | PRN
  Administered 2015-04-26: 200 mg via INTRAVENOUS

## 2015-04-26 MED ORDER — PROPOFOL 10 MG/ML IV BOLUS
INTRAVENOUS | Status: AC
  Filled 2015-04-26: qty 20

## 2015-04-26 MED ORDER — 0.9 % SODIUM CHLORIDE (POUR BTL) OPTIME
TOPICAL | Status: DC | PRN
  Administered 2015-04-26: 1000 mL

## 2015-04-26 MED ORDER — HYDROMORPHONE HCL 2 MG PO TABS
8.0000 mg | ORAL_TABLET | Freq: Once | ORAL | Status: AC
  Administered 2015-04-26: 8 mg via ORAL

## 2015-04-26 MED ORDER — SUGAMMADEX SODIUM 200 MG/2ML IV SOLN
INTRAVENOUS | Status: DC | PRN
  Administered 2015-04-26: 200 mg via INTRAVENOUS

## 2015-04-26 MED ORDER — LACTATED RINGERS IV SOLN
INTRAVENOUS | Status: DC
  Administered 2015-04-26 (×2): via INTRAVENOUS

## 2015-04-26 MED ORDER — PHENYLEPHRINE HCL 10 MG/ML IJ SOLN
INTRAMUSCULAR | Status: DC | PRN
  Administered 2015-04-26: 120 ug via INTRAVENOUS
  Administered 2015-04-26: 160 ug via INTRAVENOUS
  Administered 2015-04-26: 120 ug via INTRAVENOUS
  Administered 2015-04-26: 40 ug via INTRAVENOUS
  Administered 2015-04-26: 120 ug via INTRAVENOUS
  Administered 2015-04-26: 160 ug via INTRAVENOUS

## 2015-04-26 MED ORDER — HYDROMORPHONE HCL 2 MG PO TABS
ORAL_TABLET | ORAL | Status: AC
  Filled 2015-04-26: qty 4

## 2015-04-26 MED ORDER — LIDOCAINE HCL (CARDIAC) 20 MG/ML IV SOLN
INTRAVENOUS | Status: AC
  Filled 2015-04-26: qty 5

## 2015-04-26 MED ORDER — SODIUM CHLORIDE 0.9% FLUSH: 3.0000 mL | INTRAVENOUS | Status: DC | PRN

## 2015-04-26 MED ORDER — SODIUM CHLORIDE 0.9 % IR SOLN
Status: DC | PRN
  Administered 2015-04-26: 1000 mL

## 2015-04-26 MED ORDER — HYDROMORPHONE HCL 1 MG/ML IJ SOLN: 0.5000 mg | INTRAMUSCULAR | Status: DC | PRN

## 2015-04-26 MED ORDER — ONDANSETRON HCL 4 MG/2ML IJ SOLN
INTRAMUSCULAR | Status: DC | PRN
  Administered 2015-04-26: 4 mg via INTRAVENOUS

## 2015-04-26 MED ORDER — OXYCODONE HCL 5 MG PO TABS: 5.0000 mg | ORAL_TABLET | ORAL | Status: DC | PRN

## 2015-04-26 MED ORDER — SODIUM CHLORIDE 0.9 % IV SOLN: 250.0000 mL | INTRAVENOUS | Status: DC | PRN

## 2015-04-26 MED ORDER — ACETAMINOPHEN 650 MG RE SUPP
650.0000 mg | RECTAL | Status: DC | PRN
  Filled 2015-04-26: qty 1

## 2015-04-26 MED ORDER — HYDROMORPHONE HCL 1 MG/ML IJ SOLN
INTRAMUSCULAR | Status: DC | PRN
  Administered 2015-04-26: 1 mg via INTRAVENOUS

## 2015-04-26 MED ORDER — MIDAZOLAM HCL 2 MG/2ML IJ SOLN
INTRAMUSCULAR | Status: AC
  Filled 2015-04-26: qty 2

## 2015-04-26 MED ORDER — BUPIVACAINE HCL 0.25 % IJ SOLN
INTRAMUSCULAR | Status: DC | PRN
Start: 2015-04-26 — End: 2015-04-26
  Administered 2015-04-26: 6 mL

## 2015-04-26 MED ORDER — ACETAMINOPHEN 325 MG PO TABS
650.0000 mg | ORAL_TABLET | ORAL | Status: DC | PRN
  Filled 2015-04-26: qty 2

## 2015-04-26 MED ORDER — LIDOCAINE HCL (CARDIAC) 20 MG/ML IV SOLN
INTRAVENOUS | Status: DC | PRN
  Administered 2015-04-26: 100 mg via INTRAVENOUS

## 2015-04-26 MED ORDER — FENTANYL CITRATE (PF) 250 MCG/5ML IJ SOLN
INTRAMUSCULAR | Status: AC
  Filled 2015-04-26: qty 5

## 2015-04-26 MED ORDER — FENTANYL CITRATE (PF) 100 MCG/2ML IJ SOLN
INTRAMUSCULAR | Status: DC | PRN
  Administered 2015-04-26: 100 ug via INTRAVENOUS

## 2015-04-26 MED ORDER — SODIUM CHLORIDE 0.9% FLUSH: 3.0000 mL | Freq: Two times a day (BID) | INTRAVENOUS | Status: DC

## 2015-04-26 MED ORDER — MIDAZOLAM HCL 5 MG/5ML IJ SOLN
INTRAMUSCULAR | Status: DC | PRN
  Administered 2015-04-26: 2 mg via INTRAVENOUS

## 2015-04-26 MED ORDER — ACETAMINOPHEN 10 MG/ML IV SOLN
INTRAVENOUS | Status: DC | PRN
  Administered 2015-04-26: 1000 mg via INTRAVENOUS

## 2015-04-26 MED ORDER — ROCURONIUM BROMIDE 100 MG/10ML IV SOLN
INTRAVENOUS | Status: DC | PRN
  Administered 2015-04-26: 30 mg via INTRAVENOUS
  Administered 2015-04-26: 50 mg via INTRAVENOUS

## 2015-04-26 MED ORDER — GLYCOPYRROLATE 0.2 MG/ML IJ SOLN
INTRAMUSCULAR | Status: DC | PRN
  Administered 2015-04-26 (×2): 0.1 mg via INTRAVENOUS

## 2015-04-26 MED ORDER — KETAMINE HCL 10 MG/ML IJ SOLN
INTRAMUSCULAR | Status: DC | PRN
  Administered 2015-04-26: 50 mg via INTRAVENOUS

## 2015-04-26 MED ORDER — MORPHINE SULFATE (PF) 2 MG/ML IV SOLN: 2.0000 mg | INTRAVENOUS | Status: DC | PRN

## 2015-04-26 SURGICAL SUPPLY — 43 items
BENZOIN TINCTURE PRP APPL 2/3 (GAUZE/BANDAGES/DRESSINGS) ×2 IMPLANT
CANISTER SUCTION 2500CC (MISCELLANEOUS) ×2 IMPLANT
CHLORAPREP W/TINT 26ML (MISCELLANEOUS) ×2 IMPLANT
CLIP LIGATING HEMO O LOK GREEN (MISCELLANEOUS) ×4 IMPLANT
CLSR STERI-STRIP ANTIMIC 1/2X4 (GAUZE/BANDAGES/DRESSINGS) ×2 IMPLANT
COVER SURGICAL LIGHT HANDLE (MISCELLANEOUS) ×2 IMPLANT
COVER TRANSDUCER ULTRASND (DRAPES) IMPLANT
DEVICE TROCAR PUNCTURE CLOSURE (ENDOMECHANICALS) IMPLANT
DRSG TELFA 3X8 NADH (GAUZE/BANDAGES/DRESSINGS) ×2 IMPLANT
ELECT REM PT RETURN 9FT ADLT (ELECTROSURGICAL) ×2
ELECTRODE REM PT RTRN 9FT ADLT (ELECTROSURGICAL) ×1 IMPLANT
GAUZE SPONGE 2X2 8PLY STRL LF (GAUZE/BANDAGES/DRESSINGS) ×1 IMPLANT
GLOVE BIO SURGEON STRL SZ7 (GLOVE) ×2 IMPLANT
GLOVE BIO SURGEON STRL SZ7.5 (GLOVE) ×2 IMPLANT
GLOVE BIOGEL PI IND STRL 6.5 (GLOVE) ×1 IMPLANT
GLOVE BIOGEL PI IND STRL 7.0 (GLOVE) ×2 IMPLANT
GLOVE BIOGEL PI INDICATOR 6.5 (GLOVE) ×1
GLOVE BIOGEL PI INDICATOR 7.0 (GLOVE) ×2
GLOVE SURG SS PI 6.5 STRL IVOR (GLOVE) ×2 IMPLANT
GOWN STRL REUS W/ TWL LRG LVL3 (GOWN DISPOSABLE) ×2 IMPLANT
GOWN STRL REUS W/ TWL XL LVL3 (GOWN DISPOSABLE) ×1 IMPLANT
GOWN STRL REUS W/TWL LRG LVL3 (GOWN DISPOSABLE) ×2
GOWN STRL REUS W/TWL XL LVL3 (GOWN DISPOSABLE) ×1
GRASPER SUT TROCAR 14GX15 (MISCELLANEOUS) ×2 IMPLANT
KIT BASIN OR (CUSTOM PROCEDURE TRAY) ×2 IMPLANT
KIT ROOM TURNOVER OR (KITS) ×2 IMPLANT
NEEDLE INSUFFLATION 14GA 120MM (NEEDLE) ×2 IMPLANT
NS IRRIG 1000ML POUR BTL (IV SOLUTION) ×2 IMPLANT
PAD ARMBOARD 7.5X6 YLW CONV (MISCELLANEOUS) ×4 IMPLANT
POUCH RETRIEVAL ECOSAC 10 (ENDOMECHANICALS) ×1 IMPLANT
POUCH RETRIEVAL ECOSAC 10MM (ENDOMECHANICALS) ×1
SCISSORS LAP 5X35 DISP (ENDOMECHANICALS) ×2 IMPLANT
SET IRRIG TUBING LAPAROSCOPIC (IRRIGATION / IRRIGATOR) ×2 IMPLANT
SLEEVE ENDOPATH XCEL 5M (ENDOMECHANICALS) ×2 IMPLANT
SPECIMEN JAR SMALL (MISCELLANEOUS) ×2 IMPLANT
SPONGE GAUZE 2X2 STER 10/PKG (GAUZE/BANDAGES/DRESSINGS) ×1
SUT MNCRL AB 3-0 PS2 18 (SUTURE) ×2 IMPLANT
TOWEL OR 17X24 6PK STRL BLUE (TOWEL DISPOSABLE) ×2 IMPLANT
TOWEL OR 17X26 10 PK STRL BLUE (TOWEL DISPOSABLE) IMPLANT
TRAY LAPAROSCOPIC MC (CUSTOM PROCEDURE TRAY) ×2 IMPLANT
TROCAR XCEL NON-BLD 11X100MML (ENDOMECHANICALS) ×2 IMPLANT
TROCAR XCEL NON-BLD 5MMX100MML (ENDOMECHANICALS) ×2 IMPLANT
TUBING INSUFFLATION (TUBING) ×2 IMPLANT

## 2015-04-26 NOTE — Discharge Instructions (Signed)
[facility] ______[facility] °LAPAROSCOPIC SURGERY: POST OP INSTRUCTIONS °Always review your discharge instruction sheet given to you by the facility where your surgery was performed. °IF YOU HAVE DISABILITY OR FAMILY LEAVE FORMS, YOU MUST BRING THEM TO THE OFFICE FOR PROCESSING.   °DO NOT GIVE THEM TO YOUR DOCTOR. ° °1. A prescription for pain medication may be given to you upon discharge.  Take your pain medication as prescribed, if needed.  If narcotic pain medicine is not needed, then you may take acetaminophen (Tylenol) or ibuprofen (Advil) as needed. °2. Take your usually prescribed medications unless otherwise directed. °3. If you need a refill on your pain medication, please contact your pharmacy.  They will contact our office to request authorization. Prescriptions will not be filled after 5pm or on week-ends. °4. You should follow a light diet the first few days after arrival home, such as soup and crackers, etc.  Be sure to include lots of fluids daily. °5. Most patients will experience some swelling and bruising in the area of the incisions.  Ice packs will help.  Swelling and bruising can take several days to resolve.  °6. It is common to experience some constipation if taking pain medication after surgery.  Increasing fluid intake and taking a stool softener (such as Colace) will usually help or prevent this problem from occurring.  A mild laxative (Milk of Magnesia or Miralax) should be taken according to package instructions if there are no bowel movements after 48 hours. °7. Unless discharge instructions indicate otherwise, you may remove your bandages 24-48 hours after surgery, and you may shower at that time.  You may have steri-strips (small skin tapes) in place directly over the incision.  These strips should be left on the skin for 7-10 days.  If your surgeon used skin glue on the incision, you may shower in 24 hours.  The glue will flake off over the next 2-3 weeks.  Any sutures or  staples will be removed at the office during your follow-up visit. °8. ACTIVITIES:  You may resume regular (light) daily activities beginning the next day--such as daily self-care, walking, climbing stairs--gradually increasing activities as tolerated.  You may have sexual intercourse when it is comfortable.  Refrain from any heavy lifting or straining until approved by your doctor. °a. You may drive when you are no longer taking prescription pain medication, you can comfortably wear a seatbelt, and you can safely maneuver your car and apply brakes. °b. RETURN TO WORK:  __________________________________________________________ °9. You should see your doctor in the office for a follow-up appointment approximately 2-3 weeks after your surgery.  Make sure that you call for this appointment within a day or two after you arrive home to insure a convenient appointment time. °10. OTHER INSTRUCTIONS: __________________________________________________________________________________________________________________________ __________________________________________________________________________________________________________________________ °WHEN TO CALL YOUR DOCTOR: °1. Fever over 101.0 °2. Inability to urinate °3. Continued bleeding from incision. °4. Increased pain, redness, or drainage from the incision. °5. Increasing abdominal pain ° °The clinic staff is available to answer your questions during regular business hours.  Please don’t hesitate to call and ask to speak to one of the nurses for clinical concerns.  If you have a medical emergency, go to the nearest emergency room or call 911.  A surgeon from [facility] is always on call at the hospital. °[address], [address], [place], [place]  [address] ? [address] [address], [place],    [address] °[phone] ? [phone] ? FAX [phone] °Web site:   [email or web address] °

## 2015-04-26 NOTE — Anesthesia Postprocedure Evaluation (Signed)
Anesthesia Post Note  Patient: Victor [name]  Procedure(s) Performed: Procedure(s) (LRB): LAPAROSCOPIC CHOLECYSTECTOMY (N/A)  Patient location during evaluation: PACU Anesthesia Type: General Level of consciousness: awake and alert Pain management: pain level controlled Vital Signs Assessment: post-procedure vital signs reviewed and stable Respiratory status: spontaneous breathing, nonlabored ventilation, respiratory function stable and patient connected to nasal cannula oxygen Cardiovascular status: blood pressure returned to baseline and stable Postop Assessment: no signs of nausea or vomiting Anesthetic complications: no    Last Vitals:  Filed Vitals:   04/26/15 0928 04/26/15 1139  BP: 114/67 112/62  Pulse: 72 73  Temp: 37.1 C 36.9 C  Resp: 18 15    Last Pain:  Filed Vitals:   04/26/15 1141  PainSc: Asleep                 Zenaida Deed

## 2015-04-26 NOTE — Op Note (Addendum)
04/26/2015  11:23 AM  PATIENT:  Victor [name]  55 y.o. male  PRE-OPERATIVE DIAGNOSIS:  Gallstones  POST-OPERATIVE DIAGNOSIS:  Cirrhotic appearing liver, chronic cholecystitis, Gallstones  PROCEDURE:  Procedure(s): LAPAROSCOPIC CHOLECYSTECTOMY (N/A) CORE NEEDLE BIOPSY OF LIVER  SURGEON:  Surgeon(s) and Role:    * Ralene Ok, MD - Primary  ANESTHESIA:   local and general  EBL:  Total I/O In: 1000 [I.V.:1000] Out: 20 [Blood:20]  BLOOD ADMINISTERED:none  DRAINS: none   LOCAL MEDICATIONS USED:  BUPIVICAINE   SPECIMEN:  Source of Specimen:  gallbladder  DISPOSITION OF SPECIMEN:  PATHOLOGY  COUNTS:  YES  TOURNIQUET:  * No tourniquets in log *  DICTATION: .Dragon Dictation  EBL: [id number]   Complications: none   Counts: reported as correct x 2   Findings:chronic inflammation of the gallbladder and cirrhotic appearing liver  Indications for procedure: Pt is a 55 y/o M with RUQ pain and seen to have gallstones.   Details of the procedure: The patient was taken to the operating and placed in the supine position with bilateral SCDs in place. A time out was called and all facts were verified. A pneumoperitoneum was obtained via A Veress needle technique to a pressure of 104mm of mercury. A 25mm trochar was then placed in the right upper quadrant under visualization, and there were no injuries to any abdominal organs. A 11 mm port was then placed in the umbilical region after infiltrating with local anesthesia under direct visualization. A second epigastric port was placed under direct visualization.   The liver was cirrhotic appearing throughout.  The gallbladder was identified and retracted, the peritoneum was then sharply dissected from the gallbladder and this dissection was carried down to Calot's triangle. The cystic duct was identified and stripped away circumferentially and seen going into the gallbladder 360, the critical angle was obtained.    2 clips were placed  proximally one distally and the cystic duct transected. The cystic artery was identified and 2 clips placed proximally and one distally and transected. We then proceeded to remove the gallbladder off the hepatic fossa with Bovie cautery. A retrieval bag was then placed in the abdomen and gallbladder placed in the bag. The hepatic fossa was then reexamined and hemostasis was achieved with Bovie cautery and was excellent at this portion of the case.  A core-needle bx was used to obtain a liver biopsy with two passes.  Cautery was used to obtain hemostasis at the biopsy sites. The subhepatic fossa and perihepatic fossa was then irrigated until the effluent was clear. The specimen bag and specimen were removed from the abdominal cavity.  The 11 mm trocar fascia was reapproximated with the Endo Close #1 Vicryl x1. The pneumoperitoneum was evacuated and all trochars removed under direct visulalization. The skin was then closed with 4-0 Monocryl and the skin dressed with Steri-Strips, gauze, and tape. The patient was awaken from general anesthesia and taken to the recovery room in stable condition.    PLAN OF CARE: Discharge to home after PACU  PATIENT DISPOSITION:  PACU - hemodynamically stable.   Delay start of Pharmacological VTE agent (>24hrs) due to surgical blood loss or risk of bleeding: not applicable

## 2015-04-26 NOTE — Transfer of Care (Signed)
Immediate Anesthesia Transfer of Care Note  Patient: Victor [name]  Procedure(s) Performed: Procedure(s): LAPAROSCOPIC CHOLECYSTECTOMY (N/A)  Patient Location: PACU  Anesthesia Type:General  Level of Consciousness: awake, alert , oriented and patient cooperative  Airway & Oxygen Therapy: Patient Spontanous Breathing and Patient connected to nasal cannula oxygen  Post-op Assessment: Report given to RN, Post -op Vital signs reviewed and stable and Patient moving all extremities X 4  Post vital signs: Reviewed and stable  Last Vitals:  Filed Vitals:   04/26/15 0928 04/26/15 1139  BP: 114/67 112/62  Pulse: 72 73  Temp: 37.1 C 36.9 C  Resp: 18 15    Complications: No apparent anesthesia complications

## 2015-04-26 NOTE — Anesthesia Procedure Notes (Signed)
Procedure Name: Intubation Date/Time: 04/26/2015 10:34 AM Performed by: Layla Maw Pre-anesthesia Checklist: Patient identified, Timeout performed, Emergency Drugs available, Suction available and Patient being monitored Patient Re-evaluated:Patient Re-evaluated prior to inductionOxygen Delivery Method: Circle system utilized Preoxygenation: Pre-oxygenation with 100% oxygen Intubation Type: IV induction Ventilation: Mask ventilation without difficulty Laryngoscope Size: Mac and 4 Grade View: Grade I Tube type: Oral Tube size: 7.0 mm Number of attempts: 1 Airway Equipment and Method: Video-laryngoscopy and Stylet Placement Confirmation: ETT inserted through vocal cords under direct vision,  positive ETCO2 and breath sounds checked- equal and bilateral Secured at: 22 cm Tube secured with: Tape Dental Injury: Teeth and Oropharynx as per pre-operative assessment  Difficulty Due To: Difficulty was anticipated, Difficult Airway- due to large tongue, Difficult Airway- due to limited oral opening and Difficult Airway- due to dentition

## 2015-04-26 NOTE — H&P (View-Only) (Signed)
History of Present Illness Ralene Ok MD; 04/06/2015 1:53 PM) Patient words: Eval. gallbladder.  The patient is a 55 year old male who presents for evaluation of gall stones. The patient is a 55 year old male who is referred by Dr. Antony Contras for in evaluation of gallstones and biliary pancreatitis. Patient states he was aware of gallstones. He states that last week he was hospitalized secondary to nausea and vomiting and was shown to have biliary pancreatitis secondary to CT scan which revealed fluid around the pancreas.  Patient states he began with abdominal pain near the right upper quadrant area. He states this is approximate 2 AM. This followed nausea vomiting continued pain. Patient was evaluated year thereafter.  Patient has history of back pain and is currently on a pain contract. He recently had a back similar placed by Dr. Rolena Infante. Patient does have a history of thrombocytopenia. Patient has had platelets on hold for surgery previous operations.   Other Problems Lars Mage Spillers, CMA; 04/06/2015 1:35 PM) Anxiety Disorder Arthritis Back Pain Cholelithiasis Diabetes Mellitus Gastroesophageal Reflux Disease Hepatitis High blood pressure Hypercholesterolemia Kidney Stone Pancreatitis Sleep Apnea  Past Surgical History Lars Mage Spillers, CMA; 04/06/2015 1:35 PM) Knee Surgery Bilateral. Shoulder Surgery Left.  Diagnostic Studies History Illene Regulus, CMA; 04/06/2015 1:35 PM) Colonoscopy 1-5 years ago  Allergies Lars Mage Spillers, CMA; 04/06/2015 1:37 PM) Cyclobenzaprine *CHEMICALS* Meperidine HCl *ANALGESICS - OPIOID* Vicodin *ANALGESICS - OPIOID*  Medication History (Alisha Spillers, CMA; 04/06/2015 1:43 PM) ALPRAZolam (2MG  Tablet, Oral) Active. Atenolol (25MG  Tablet, Oral) Active. Atorvastatin Calcium (40MG  Tablet, Oral) Active. Lisinopril (2.5MG  Tablet, Oral) Active. MetFORMIN HCl (1000MG  Tablet, Oral) Active. Pantoprazole Sodium  (40MG  Tablet DR, Oral) Active. Emsam (6MG /24HR Patch 24HR, Transdermal) Active. Zaleplon (10MG  Capsule, Oral) Active. Toujeo SoloStar (300UNIT/ML Soln Pen-inj, Subcutaneous) Active. Fish Oil (1000MG  Capsule DR, Oral) Active. Vitamin E (400UNIT Tablet, Oral) Active. Medications Reconciled  Social History Illene Regulus, CMA; 04/06/2015 1:35 PM) Caffeine use Carbonated beverages. No alcohol use No drug use Tobacco use Former smoker.  Family History Illene Regulus, CMA; 04/06/2015 1:35 PM) Alcohol Abuse Brother, Father, Sister. Cerebrovascular Accident Father. Colon Cancer Father. Hypertension Father. Respiratory Condition Mother.    Review of Systems Lars Mage Spillers CMA; 04/06/2015 1:35 PM) General Present- Appetite Loss, Chills and Fatigue. Not Present- Fever, Night Sweats, Weight Gain and Weight Loss. Skin Not Present- Change in Wart/Mole, Dryness, Hives, Jaundice, New Lesions, Non-Healing Wounds, Rash and Ulcer. HEENT Present- Wears glasses/contact lenses. Not Present- Earache, Hearing Loss, Hoarseness, Nose Bleed, Oral Ulcers, Ringing in the Ears, Seasonal Allergies, Sinus Pain, Sore Throat, Visual Disturbances and Yellow Eyes. Respiratory Not Present- Bloody sputum, Chronic Cough, Difficulty Breathing, Snoring and Wheezing. Breast Not Present- Breast Mass, Breast Pain, Nipple Discharge and Skin Changes. Cardiovascular Not Present- Chest Pain, Difficulty Breathing Lying Down, Leg Cramps, Palpitations, Rapid Heart Rate, Shortness of Breath and Swelling of Extremities. Gastrointestinal Present- Abdominal Pain and Bloating. Not Present- Bloody Stool, Change in Bowel Habits, Chronic diarrhea, Constipation, Difficulty Swallowing, Excessive gas, Gets full quickly at meals, Hemorrhoids, Indigestion, Nausea, Rectal Pain and Vomiting. Male Genitourinary Not Present- Blood in Urine, Change in Urinary Stream, Frequency, Impotence, Nocturia, Painful Urination, Urgency and Urine  Leakage. Musculoskeletal Present- Back Pain, Joint Pain and Muscle Pain. Not Present- Joint Stiffness, Muscle Weakness and Swelling of Extremities. Neurological Not Present- Decreased Memory, Fainting, Headaches, Numbness, Seizures, Tingling, Tremor, Trouble walking and Weakness. Psychiatric Present- Anxiety. Not Present- Bipolar, Change in Sleep Pattern, Depression, Fearful and Frequent crying. Endocrine Present- New Diabetes. Not Present- Cold Intolerance, Excessive Hunger,  Hair Changes, Heat Intolerance and Hot flashes. Hematology Not Present- Easy Bruising, Excessive bleeding, Gland problems, HIV and Persistent Infections.  Vitals (Alisha Spillers CMA; 04/06/2015 1:36 PM) 04/06/2015 1:36 PM Weight: 283 lb Height: 72in Body Surface Area: 2.47 m Body Mass Index: 38.38 kg/m  Pulse: 76 (Regular)  BP: 140/82 (Sitting, Left Arm, Standard)       Physical Exam Ralene Ok, MD; 04/06/2015 1:54 PM) General Mental Status-Alert. General Appearance-Consistent with stated age. Hydration-Well hydrated. Voice-Normal.  Head and Neck Head-normocephalic, atraumatic with no lesions or palpable masses.  Eye Eyeball - Bilateral-Extraocular movements intact. Sclera/Conjunctiva - Bilateral-No scleral icterus.  Chest and Lung Exam Chest and lung exam reveals -quiet, even and easy respiratory effort with no use of accessory muscles. Inspection Chest Wall - Normal. Back - normal.  Cardiovascular Cardiovascular examination reveals -normal heart sounds, regular rate and rhythm with no murmurs.  Abdomen Inspection Normal Exam - No Hernias. Palpation/Percussion Normal exam - Soft, Non Tender, No Rebound tenderness, No Rigidity (guarding) and No hepatosplenomegaly. Auscultation Normal exam - Bowel sounds normal.  Neurologic Neurologic evaluation reveals -alert and oriented x 3 with no impairment of recent or remote memory. Mental  Status-Normal.  Musculoskeletal Normal Exam - Left-Upper Extremity Strength Normal and Lower Extremity Strength Normal. Normal Exam - Right-Upper Extremity Strength Normal, Lower Extremity Weakness.    Assessment & Plan Ralene Ok MD; 04/06/2015 1:53 PM) SYMPTOMATIC CHOLELITHIASIS (K80.20) Impression: 55 year old male with cholelithiasis and subsequently biliary pancreatitis.  1. We will proceed to the operating room for a laparoscopic cholecystectomy. We will have platelets on standby prior to surgery. 2. Risks and benefits were discussed with the patient to generally include, but not limited to: infection, bleeding, possible need for post op ERCP, damage to the bile ducts, bile leak, and possible need for further surgery. Alternatives were offered and described. All questions were answered and the patient voiced understanding of the procedure and wishes to proceed at this point with a laparoscopic cholecystectomy

## 2015-04-26 NOTE — Interval H&P Note (Signed)
History and Physical Interval Note:  04/26/2015 9:56 AM  Victor [name]  has presented today for surgery, with the diagnosis of Gallstones  The various methods of treatment have been discussed with the patient and family. After consideration of risks, benefits and other options for treatment, the patient has consented to  Procedure(s): LAPAROSCOPIC CHOLECYSTECTOMY (N/A) as a surgical intervention .  The patient's history has been reviewed, patient examined, no change in status, stable for surgery.  I have reviewed the patient's chart and labs.  Questions were answered to the patient's satisfaction.     Rosario Jacks., Anne Hahn

## 2015-04-27 ENCOUNTER — Encounter (HOSPITAL_COMMUNITY): Payer: Self-pay | Admitting: General Surgery

## 2015-10-20 ENCOUNTER — Encounter (HOSPITAL_COMMUNITY): Payer: Self-pay | Admitting: Psychiatry

## 2015-10-20 ENCOUNTER — Ambulatory Visit (INDEPENDENT_AMBULATORY_CARE_PROVIDER_SITE_OTHER): Payer: BLUE CROSS/BLUE SHIELD | Admitting: Psychiatry

## 2015-10-20 VITALS — BP 130/84 | HR 69 | Ht 72.0 in | Wt 279.0 lb

## 2015-10-20 DIAGNOSIS — F411 Generalized anxiety disorder: Secondary | ICD-10-CM

## 2015-10-20 DIAGNOSIS — F0630 Mood disorder due to known physiological condition, unspecified: Secondary | ICD-10-CM | POA: Diagnosis not present

## 2015-10-20 DIAGNOSIS — G4700 Insomnia, unspecified: Secondary | ICD-10-CM | POA: Diagnosis not present

## 2015-10-20 DIAGNOSIS — F324 Major depressive disorder, single episode, in partial remission: Secondary | ICD-10-CM

## 2015-10-20 NOTE — Progress Notes (Deleted)
Psychiatric Initial Adult Assessment   Patient Identification: Victor [name] MRN:  [id number] Date of Evaluation:  10/20/2015 Referral Source:  Chief Complaint:   Chief Complaint    Advice Only     Visit Diagnosis:    ICD-9-CM ICD-10-CM   1. Major depressive disorder with single episode, in partial remission ([place]) 296.25 F32.4   2. GAD (generalized anxiety disorder) 300.02 F41.1   3. Mood disorder in conditions classified elsewhere 293.83 F06.30   4. Insomnia 780.52 G47.00     History of Present Illness:  ***  Associated Signs/Symptoms: Depression Symptoms:  {DEPRESSION SYMPTOMS:20000} (Hypo) Manic Symptoms:  {BHH MANIC SYMPTOMS:22872} Anxiety Symptoms:  {BHH ANXIETY SYMPTOMS:22873} Psychotic Symptoms:  {BHH PSYCHOTIC SYMPTOMS:22874} PTSD Symptoms: {BHH PTSD SYMPTOMS:22875}  Past Psychiatric History: ***  Previous Psychotropic Medications: {YES/NO:21197}  Substance Abuse History in the last 12 months:  {yes no:314532}  Consequences of Substance Abuse: {BHH CONSEQUENCES OF SUBSTANCE ABUSE:22880}  Past Medical History:  Past Medical History:  Diagnosis Date  . Anxiety    takes Xanax daily as needed  . Blood dyscrasia    thrombocytopenia   . Blood transfusion    platelets  . C. difficile colitis 03/2015  . Depression    Emsam patch daily  . Diabetes mellitus    Toujeo daily as well as Metformin  . GERD (gastroesophageal reflux disease)    takes Protonix daily  . Hepatitis    hepatitis b/ newly diagnosed with portal hypertension  . Hepatitis B virus infection 03/2007  . History of shingles   . Hyperlipidemia    takes AtorvaStatin daily  . Hypertension    takes Atenolol and Lisinopril daily  . Obstructive sleep apnea (adult) (pediatric)    mild with AHI 11.61/hr now on CPAP at 15cm h2o, CPAP is used q night   . Osteoarthritis   . Pancreatitis   . Sciatica    lumbar region - 2010, also has had numerous injections   . Splenomegaly    LOV Dr Lamonte Sakai  12/12 on  chart  . Thrombocytopenia ([place])     Past Surgical History:  Procedure Laterality Date  . CHOLECYSTECTOMY N/A 04/26/2015   Procedure: LAPAROSCOPIC CHOLECYSTECTOMY;  Surgeon: Ralene Ok, MD;  Location: [facility];  Service: General;  Laterality: N/A;  . HAND SURGERY     right hand  . KNEE SURGERY     5 surgeries to right knee, 2 surgeries to left knee  . KYPHOPLASTY    . LUMBAR LAMINECTOMY/DECOMPRESSION MICRODISCECTOMY  02/19/2011   Procedure: LUMBAR LAMINECTOMY/DECOMPRESSION MICRODISCECTOMY;  Surgeon: Johnn Hai;  Location: [facility];  Service: Orthopedics;  Laterality: Left;  decompression l5-s1 l4-5 on left  . SHOULDER SURGERY     left shoulder  . SPINAL CORD STIMULATOR INSERTION N/A 02/02/2015   Procedure: LUMBAR SPINAL CORD STIMULATOR INSERTION;  Surgeon: Melina Schools, MD;  Location: [facility];  Service: Orthopedics;  Laterality: N/A;    Family Psychiatric History: ***  Family History:  Family History  Problem Relation Age of Onset  . Cancer Mother     Social History:   Social History   Social History  . Marital status: Married    Spouse name: N/A  . Number of children: N/A  . Years of education: N/A   Social History Main Topics  . Smoking status: Former Smoker    Packs/day: 1.00    Quit date: 12/31/1985  . Smokeless tobacco: Never Used  . Alcohol use No  . Drug use: No  . Sexual activity: Not  Asked   Other Topics Concern  . None   Social History Narrative  . None    Additional Social History: ***  Allergies:   Allergies  Allergen Reactions  . Cyclobenzaprine Anaphylaxis and Other (See Comments)    REACTION:  Not compatible with Emsam patch.  Julaine Fusi [Albiglutide] Other (See Comments)    PANCREATITIS  . Canagliflozin Itching and Other (See Comments)    Yeast infections   . Meperidine Rash and Other (See Comments)    REACTION: not compatible with Emsam patch.  . Vicodin [Hydrocodone-Acetaminophen] Hives and Other (See Comments)    REACTION: pt turns  red and has hot flashes   . Nsaids Diarrhea and Other (See Comments)    Stomach Upset     Metabolic Disorder Labs: Lab Results  Component Value Date   HGBA1C 7.3 (H) 04/22/2015   MPG 163 04/22/2015   MPG 240 01/31/2015   No results found for: PROLACTIN No results found for: CHOL, TRIG, HDL, CHOLHDL, VLDL, LDLCALC   Current Medications: Current Outpatient Prescriptions  Medication Sig Dispense Refill  . alprazolam (XANAX) 2 MG tablet Take 2 mg by mouth 3 (three) times daily as needed for anxiety.     Marland Kitchen atenolol (TENORMIN) 25 MG tablet Take 25 mg by mouth every morning.    Marland Kitchen atorvastatin (LIPITOR) 40 MG tablet Take 40 mg by mouth daily before breakfast.     . Canagliflozin-Metformin HCl [phone] MG TABS Take by mouth.    Marland Kitchen lisinopril (PRINIVIL,ZESTRIL) 2.5 MG tablet Take 2.5 mg by mouth daily.    Marland Kitchen morphine (MS CONTIN) 30 MG 12 hr tablet Take 30 mg by mouth every 12 (twelve) hours.    Marland Kitchen omega-3 acid ethyl esters (LOVAZA) 1 G capsule Take 1 g by mouth daily before breakfast.     . oxyCODONE (ROXICODONE) 5 MG immediate release tablet Take 1 tablet (5 mg total) by mouth every 4 (four) hours as needed for severe pain. 30 tablet 0  . pantoprazole (PROTONIX) 40 MG tablet Take 40 mg by mouth daily before breakfast.     . selegiline (EMSAM) 6 MG/24HR Place 1 patch onto the skin daily.     Nelva Nay SOLOSTAR 300 UNIT/ML SOPN Inject 80 Units into the skin daily before breakfast.   0  . vitamin E 400 UNIT capsule Take 400 Units by mouth daily.    . zaleplon (SONATA) 10 MG capsule Take 10 mg by mouth at bedtime. May take an additional 10mg  by mouth if awakens during the night  0  . HYDROmorphone (DILAUDID) 8 MG tablet Take 8 mg by mouth every 6 (six) hours as needed for moderate pain or severe pain.    . metFORMIN (GLUCOPHAGE) 1000 MG tablet Take 1,000 mg by mouth 2 (two) times daily with a meal.      No current facility-administered medications for this visit.    Facility-Administered  Medications Ordered in Other Visits  Medication Dose Route Frequency Provider Last Rate Last Dose  . 0.9 %  sodium chloride infusion   Intravenous Once Ralene Ok, MD        Neurologic: Headache: {BHH YES OR NO:22294} Seizure: {BHH YES OR NO:22294} Paresthesias:{BHH YES OR JH:3695533  Musculoskeletal: Strength & Muscle Tone: {desc; muscle tone:32375} Gait & Station: {PE GAIT ED EF:6704556 Patient leans: {Patient Leans:21022755}  Psychiatric Specialty Exam: ROS  Blood pressure 130/84, pulse 69, height 6' (1.829 m), weight 279 lb (126.6 kg), SpO2 97 %.Body mass index is 37.84 kg/m.  General  Appearance: {Appearance:22683}  Eye Contact:  {BHH EYE CONTACT:22684}  Speech:  {Speech:22685}  Volume:  {Volume (PAA):22686}  Mood:  {BHH MOOD:22306}  Affect:  {Affect (PAA):22687}  Thought Process:  {Thought Process (PAA):22688}  Orientation:  {BHH ORIENTATION (PAA):22689}  Thought Content:  {Thought Content:22690}  Suicidal Thoughts:  {ST/HT (PAA):22692}  Homicidal Thoughts:  {ST/HT (PAA):22692}  Memory:  {BHH MEMORY:22881}  Judgement:  {Judgement (PAA):22694}  Insight:  {Insight (PAA):22695}  Psychomotor Activity:  {Psychomotor (PAA):22696}  Concentration:  {Concentration:21399}  Recall:  {BHH GOOD/FAIR/POOR:22877}  Fund of Knowledge:{BHH GOOD/FAIR/POOR:22877}  Language: {BHH GOOD/FAIR/POOR:22877}  Akathisia:  {BHH YES OR NO:22294}  Handed:  {Handed:22697}  AIMS (if indicated):  ***  Assets:  {Assets (PAA):22698}  ADL's:  {BHH XO:4411959  Cognition: {chl bhh cognition:304700322}  Sleep:  ***    Treatment Plan Summary: {CHL AMB BH MD TX ME:[id number]   Merian Capron, MD 9/7/201711:28 AM
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
## 2015-10-20 NOTE — Patient Instructions (Signed)
Follow up with [facility] where he is being prescribed xanax. He has appointment with them next week. Has filled his meds and has enough till his appointment.

## 2015-11-15 ENCOUNTER — Encounter: Payer: Self-pay | Admitting: Interventional Cardiology

## 2015-11-29 ENCOUNTER — Ambulatory Visit: Payer: BLUE CROSS/BLUE SHIELD | Admitting: Cardiology

## 2016-01-19 ENCOUNTER — Encounter: Payer: Self-pay | Admitting: Cardiology

## 2016-01-20 ENCOUNTER — Encounter (INDEPENDENT_AMBULATORY_CARE_PROVIDER_SITE_OTHER): Payer: Self-pay

## 2016-01-20 ENCOUNTER — Ambulatory Visit: Payer: BLUE CROSS/BLUE SHIELD | Admitting: Physician Assistant

## 2016-01-20 ENCOUNTER — Ambulatory Visit (INDEPENDENT_AMBULATORY_CARE_PROVIDER_SITE_OTHER): Payer: BLUE CROSS/BLUE SHIELD | Admitting: Cardiology

## 2016-01-20 ENCOUNTER — Encounter: Payer: Self-pay | Admitting: Cardiology

## 2016-01-20 VITALS — BP 146/82 | HR 64 | Ht 72.0 in | Wt 278.0 lb

## 2016-01-20 DIAGNOSIS — E6609 Other obesity due to excess calories: Secondary | ICD-10-CM

## 2016-01-20 DIAGNOSIS — G4733 Obstructive sleep apnea (adult) (pediatric): Secondary | ICD-10-CM

## 2016-01-20 DIAGNOSIS — I10 Essential (primary) hypertension: Secondary | ICD-10-CM | POA: Diagnosis not present

## 2016-01-20 NOTE — Progress Notes (Signed)
Cardiology Office Note    Date:  01/20/2016   ID:  Victor [name], DOB [birth year], MRN [id number]  PCP:  [name], MD  Cardiologist:  Fransico Him, MD   Chief Complaint  Patient presents with  . Sleep Apnea  . Hypertension    History of Present Illness:  Victor [name] is a 55 y.o. male with a history of OSA, obesity and HTN who presents today for followup. He is doing well today. He tolerates his CPAP therapy well. He uses a nasal mask with no chin strap and tolerates the mask. He feels tthat the pressure is ok. He feels rested in the am if he has slept well but has to get up several times nightly to use the bathroom. He usually naps for about an hour around lunch daily. He occasionally will have some dry mouth which has gotten worse.  He does not snore.      Past Medical History:  Diagnosis Date  . Anxiety    takes Xanax daily as needed  . Blood dyscrasia    thrombocytopenia   . Blood transfusion    platelets  . C. difficile colitis 03/2015  . Depression    Emsam patch daily  . Diabetes mellitus    Toujeo daily as well as Metformin  . GERD (gastroesophageal reflux disease)    takes Protonix daily  . Hepatitis    hepatitis b/ newly diagnosed with portal hypertension  . Hepatitis B virus infection 03/2007  . History of shingles   . Hyperlipidemia    takes AtorvaStatin daily  . Hypertension    takes Atenolol and Lisinopril daily  . Obstructive sleep apnea (adult) (pediatric)    mild with AHI 11.61/hr now on CPAP at 15cm h2o, CPAP is used q night   . Osteoarthritis   . Pancreatitis   . Sciatica    lumbar region - 2010, also has had numerous injections   . Splenomegaly    LOV Dr Lamonte Sakai  12/12 on chart  . Thrombocytopenia ([place])     Past Surgical History:  Procedure Laterality Date  . CHOLECYSTECTOMY N/A 04/26/2015   Procedure: LAPAROSCOPIC CHOLECYSTECTOMY;  Surgeon: Ralene Ok, MD;  Location: [facility];  Service: General;  Laterality: N/A;  . HAND SURGERY     right hand  . KNEE SURGERY     5 surgeries to right knee, 2 surgeries to left knee  . KYPHOPLASTY    . LUMBAR LAMINECTOMY/DECOMPRESSION MICRODISCECTOMY  02/19/2011   Procedure: LUMBAR LAMINECTOMY/DECOMPRESSION MICRODISCECTOMY;  Surgeon: Johnn Hai;  Location: [facility];  Service: Orthopedics;  Laterality: Left;  decompression l5-s1 l4-5 on left  . SHOULDER SURGERY     left shoulder  . SPINAL CORD STIMULATOR INSERTION N/A 02/02/2015   Procedure: LUMBAR SPINAL CORD STIMULATOR INSERTION;  Surgeon: Melina Schools, MD;  Location: [facility];  Service: Orthopedics;  Laterality: N/A;    Current Medications: Outpatient Medications Prior to Visit  Medication Sig Dispense Refill  . atenolol (TENORMIN) 25 MG tablet Take 25 mg by mouth every morning.    Marland Kitchen atorvastatin (LIPITOR) 40 MG tablet Take 40 mg by mouth daily before breakfast.     . Canagliflozin-Metformin HCl [phone] MG TABS Take 2 tablets by mouth daily.     Marland Kitchen HYDROmorphone (DILAUDID) 8 MG tablet Take 8 mg by mouth every 6 (six) hours as needed for moderate pain or severe pain.    Marland Kitchen lisinopril (PRINIVIL,ZESTRIL) 2.5 MG tablet Take 2.5 mg by mouth daily.    Marland Kitchen  TOUJEO SOLOSTAR 300 UNIT/ML SOPN Inject 80 Units into the skin daily before breakfast.   0  . vitamin E 400 UNIT capsule Take 400 Units by mouth daily.    . zaleplon (SONATA) 10 MG capsule Take 10 mg by mouth at bedtime. May take an additional 10mg  by mouth if awakens during the night  0  . alprazolam (XANAX) 2 MG tablet Take 2 mg by mouth 3 (three) times daily as needed for anxiety.     . metFORMIN (GLUCOPHAGE) 1000 MG tablet Take 1,000 mg by mouth 2 (two) times daily with a meal.     . morphine (MS CONTIN) 30 MG 12 hr tablet Take 30 mg by mouth every 12 (twelve) hours.    Marland Kitchen omega-3 acid ethyl esters (LOVAZA) 1 G capsule Take 1 g by mouth daily before breakfast.     . oxyCODONE (ROXICODONE) 5 MG immediate release tablet Take 1 tablet (5 mg total) by mouth every 4 (four) hours as needed for  severe pain. 30 tablet 0  . pantoprazole (PROTONIX) 40 MG tablet Take 40 mg by mouth daily before breakfast.     . selegiline (EMSAM) 6 MG/24HR Place 1 patch onto the skin daily.      Facility-Administered Medications Prior to Visit  Medication Dose Route Frequency Provider Last Rate Last Dose  . 0.9 %  sodium chloride infusion   Intravenous Once Ralene Ok, MD         Allergies:   Cyclobenzaprine; Tanzeum [albiglutide]; Canagliflozin; Meperidine; Vicodin [hydrocodone-acetaminophen]; and Nsaids   Social History   Social History  . Marital status: Married    Spouse name: N/A  . Number of children: N/A  . Years of education: N/A   Social History Main Topics  . Smoking status: Former Smoker    Packs/day: 1.00    Quit date: 12/31/1985  . Smokeless tobacco: Never Used  . Alcohol use No  . Drug use: No  . Sexual activity: Not Asked   Other Topics Concern  . None   Social History Narrative  . None     Family History:  The patient's family history includes Cancer in his mother.   ROS:   Please see the history of present illness.    ROS All other systems reviewed and are negative.  No flowsheet data found.     PHYSICAL EXAM:   VS:  BP (!) 146/82   Pulse 64   Ht 6' (1.829 m)   Wt 278 lb (126.1 kg)   BMI 37.70 kg/m    GEN: Well nourished, well developed, in no acute distress  HEENT: normal  Neck: no JVD, carotid bruits, or masses Cardiac: RRR; no murmurs, rubs, or gallops,no edema.  Intact distal pulses bilaterally.  Respiratory:  clear to auscultation bilaterally, normal work of breathing GI: soft, nontender, nondistended, + BS MS: no deformity or atrophy  Skin: warm and dry, no rash Neuro:  Alert and Oriented x 3, Strength and sensation are intact Psych: euthymic mood, full affect  Wt Readings from Last 3 Encounters:  01/20/16 278 lb (126.1 kg)  04/26/15 279 lb (126.6 kg)  04/22/15 279 lb 14.4 oz (127 kg)      Studies/Labs Reviewed:   EKG:  EKG is  not ordered today.   Recent Labs: 04/22/2015: BUN 11; Creatinine, Ser 0.76; Potassium 3.9; Sodium 141 04/26/2015: ALT 25; Hemoglobin 12.5; Platelets 61   Lipid Panel No results found for: CHOL, TRIG, HDL, CHOLHDL, VLDL, LDLCALC, LDLDIRECT  Additional studies/ records  that were reviewed today include:  nond    ASSESSMENT:    1. Obstructive sleep apnea   2. Essential hypertension   3. Class 1 obesity due to excess calories with serious comorbidity in adult, unspecified BMI      PLAN:  In order of problems listed above:  OSA - the patient is tolerating PAP therapy well without any problems. The PAP download was reviewed today and showed an AHI of 0.2/hr on 15 cm H2O with 100% compliance in using more than 4 hours nightly.  The patient has been using and benefiting from CPAP use and will continue to benefit from therapy. I have encouraged him to increase the humidity on his machine to help with dry nose.  I have encouraged him to use his chin strap for the dry mouth.  His AHI is so low that I think we may be able to take his pressure down some.  I will get a 2 week autotitration from 4 to 18cm H2O and see if we can lower his pressure which may help with dryness as well.  HTN - BP controlled on current meds. 3.   Obesity - I have encouraged him to get into a routine exercise program and cut back on carbs and portions.     Medication Adjustments/Labs and Tests Ordered: Current medicines are reviewed at length with the patient today.  Concerns regarding medicines are outlined above.  Medication changes, Labs and Tests ordered today are listed in the Patient Instructions below.  There are no Patient Instructions on file for this visit.   Signed, Fransico Him, MD  01/20/2016 11:09 AM    [address] [place], [place], [place]  [address] Phone: [phone]; Fax: [phone]

## 2016-01-20 NOTE — Patient Instructions (Signed)
Medication Instructions:  Your physician recommends that you continue on your current medications as directed. Please refer to the Current Medication list given to you today.   Labwork: None  Testing/Procedures: Dr. Radford Pax has ordered for you a 2 week auto-titration.  Follow-Up: Your physician wants you to follow-up in: 1 year with Dr. Radford Pax. You will receive a reminder letter in the mail two months in advance. If you don't receive a letter, please call our office to schedule the follow-up appointment.   Any Other Special Instructions Will Be Listed Below (If Applicable).     If you need a refill on your cardiac medications before your next appointment, please call your pharmacy.

## 2016-01-24 ENCOUNTER — Ambulatory Visit: Payer: BLUE CROSS/BLUE SHIELD | Admitting: Cardiology

## 2016-02-14 ENCOUNTER — Encounter: Payer: Self-pay | Admitting: Cardiology

## 2016-02-20 DIAGNOSIS — F331 Major depressive disorder, recurrent, moderate: Secondary | ICD-10-CM | POA: Diagnosis not present

## 2016-02-21 DIAGNOSIS — M961 Postlaminectomy syndrome, not elsewhere classified: Secondary | ICD-10-CM | POA: Diagnosis not present

## 2016-02-21 DIAGNOSIS — M546 Pain in thoracic spine: Secondary | ICD-10-CM | POA: Diagnosis not present

## 2016-02-21 DIAGNOSIS — G8929 Other chronic pain: Secondary | ICD-10-CM | POA: Diagnosis not present

## 2016-02-21 DIAGNOSIS — M5442 Lumbago with sciatica, left side: Secondary | ICD-10-CM | POA: Diagnosis not present

## 2016-02-23 ENCOUNTER — Telehealth: Payer: Self-pay | Admitting: Cardiology

## 2016-02-23 NOTE — Telephone Encounter (Signed)
Called the patient back to see what he needed and he wanted to know the result of his titration, I explained to him that dr Radford Pax has not sent me any results on it yet and that when she does I will give him a call. He verbalized understanding and was agreeable.

## 2016-02-23 NOTE — Telephone Encounter (Signed)
New message  Pt call requesting to speak with RN. Pt states he was to receive a call back from the office after his office visit on 12/8. Please call back to discuss

## 2016-03-05 ENCOUNTER — Telehealth: Payer: Self-pay | Admitting: Cardiology

## 2016-03-05 NOTE — Telephone Encounter (Signed)
NEw message  Pt call requesting to speak with RN. Pt states he was to receive a call back on his results for his machine. Please call back to discuss

## 2016-03-06 ENCOUNTER — Other Ambulatory Visit: Payer: Self-pay | Admitting: *Deleted

## 2016-03-06 DIAGNOSIS — G4733 Obstructive sleep apnea (adult) (pediatric): Secondary | ICD-10-CM

## 2016-03-13 ENCOUNTER — Encounter (HOSPITAL_COMMUNITY): Payer: Self-pay | Admitting: *Deleted

## 2016-03-14 ENCOUNTER — Encounter (HOSPITAL_COMMUNITY): Payer: Self-pay | Admitting: Certified Registered Nurse Anesthetist

## 2016-03-14 ENCOUNTER — Encounter (HOSPITAL_COMMUNITY)
Admission: RE | Disposition: A | Discharge status: Home / Self Care | Payer: Self-pay | Source: Ambulatory Visit | Attending: Orthopedic Surgery

## 2016-03-14 ENCOUNTER — Ambulatory Visit (HOSPITAL_COMMUNITY): Payer: Medicare Other | Admitting: Certified Registered Nurse Anesthetist

## 2016-03-14 ENCOUNTER — Ambulatory Visit (HOSPITAL_COMMUNITY)
Admission: RE | Admit: 2016-03-14 | Discharge: 2016-03-14 | Disposition: A | Discharge status: Home / Self Care | Payer: Medicare Other | Source: Ambulatory Visit | Attending: Orthopedic Surgery | Admitting: Orthopedic Surgery

## 2016-03-14 DIAGNOSIS — Y793 Surgical instruments, materials and orthopedic devices (including sutures) associated with adverse incidents: Secondary | ICD-10-CM | POA: Diagnosis not present

## 2016-03-14 DIAGNOSIS — T85192A Other mechanical complication of implanted electronic neurostimulator (electrode) of spinal cord, initial encounter: Secondary | ICD-10-CM | POA: Diagnosis not present

## 2016-03-14 DIAGNOSIS — Z8619 Personal history of other infectious and parasitic diseases: Secondary | ICD-10-CM | POA: Diagnosis not present

## 2016-03-14 DIAGNOSIS — F419 Anxiety disorder, unspecified: Secondary | ICD-10-CM | POA: Insufficient documentation

## 2016-03-14 DIAGNOSIS — Z87442 Personal history of urinary calculi: Secondary | ICD-10-CM | POA: Insufficient documentation

## 2016-03-14 DIAGNOSIS — Z794 Long term (current) use of insulin: Secondary | ICD-10-CM | POA: Insufficient documentation

## 2016-03-14 DIAGNOSIS — T85848A Pain due to other internal prosthetic devices, implants and grafts, initial encounter: Secondary | ICD-10-CM | POA: Diagnosis present

## 2016-03-14 DIAGNOSIS — T85890A Other specified complication of nervous system prosthetic devices, implants and grafts, initial encounter: Secondary | ICD-10-CM | POA: Diagnosis not present

## 2016-03-14 DIAGNOSIS — Z79891 Long term (current) use of opiate analgesic: Secondary | ICD-10-CM | POA: Diagnosis not present

## 2016-03-14 DIAGNOSIS — F329 Major depressive disorder, single episode, unspecified: Secondary | ICD-10-CM | POA: Diagnosis not present

## 2016-03-14 DIAGNOSIS — K219 Gastro-esophageal reflux disease without esophagitis: Secondary | ICD-10-CM | POA: Diagnosis not present

## 2016-03-14 DIAGNOSIS — E119 Type 2 diabetes mellitus without complications: Secondary | ICD-10-CM | POA: Diagnosis not present

## 2016-03-14 DIAGNOSIS — T85193A Other mechanical complication of implanted electronic neurostimulator, generator, initial encounter: Secondary | ICD-10-CM | POA: Diagnosis not present

## 2016-03-14 DIAGNOSIS — L918 Other hypertrophic disorders of the skin: Secondary | ICD-10-CM | POA: Diagnosis not present

## 2016-03-14 DIAGNOSIS — M545 Low back pain: Secondary | ICD-10-CM | POA: Diagnosis not present

## 2016-03-14 DIAGNOSIS — M961 Postlaminectomy syndrome, not elsewhere classified: Secondary | ICD-10-CM | POA: Diagnosis not present

## 2016-03-14 DIAGNOSIS — Z888 Allergy status to other drugs, medicaments and biological substances status: Secondary | ICD-10-CM | POA: Insufficient documentation

## 2016-03-14 DIAGNOSIS — G894 Chronic pain syndrome: Secondary | ICD-10-CM | POA: Diagnosis not present

## 2016-03-14 DIAGNOSIS — Z885 Allergy status to narcotic agent status: Secondary | ICD-10-CM | POA: Diagnosis not present

## 2016-03-14 DIAGNOSIS — M5136 Other intervertebral disc degeneration, lumbar region: Secondary | ICD-10-CM | POA: Diagnosis not present

## 2016-03-14 DIAGNOSIS — E7800 Pure hypercholesterolemia, unspecified: Secondary | ICD-10-CM | POA: Insufficient documentation

## 2016-03-14 DIAGNOSIS — Z87891 Personal history of nicotine dependence: Secondary | ICD-10-CM | POA: Diagnosis not present

## 2016-03-14 HISTORY — PX: SPINAL CORD STIMULATOR BATTERY EXCHANGE: SHX6202

## 2016-03-14 HISTORY — DX: Personal history of urinary calculi: Z87.442

## 2016-03-14 LAB — COMPREHENSIVE METABOLIC PANEL
ALT: 29 U/L (ref 17–63)
AST: 38 U/L (ref 15–41)
Albumin: 3.7 g/dL (ref 3.5–5.0)
Alkaline Phosphatase: 65 U/L (ref 38–126)
Anion gap: 12 (ref 5–15)
BUN: 10 mg/dL (ref 6–20)
CO2: 21 mmol/L — ABNORMAL LOW (ref 22–32)
Calcium: 9.7 mg/dL (ref 8.9–10.3)
Chloride: 104 mmol/L (ref 101–111)
Creatinine, Ser: 0.87 mg/dL (ref 0.61–1.24)
GFR calc Af Amer: >60 mL/min (ref >60)
GFR calc non Af Amer: >60 mL/min (ref >60)
Glucose, Bld: 122 mg/dL — ABNORMAL HIGH (ref 65–99)
Potassium: 3.8 mmol/L (ref 3.5–5.1)
Sodium: 137 mmol/L (ref 135–145)
Total Bilirubin: 1.6 mg/dL — ABNORMAL HIGH (ref 0.3–1.2)
Total Protein: 6.9 g/dL (ref 6.5–8.1)

## 2016-03-14 LAB — CBC
HCT: 43 % (ref 39.0–52.0)
Hemoglobin: 14.6 g/dL (ref 13.0–17.0)
MCH: 28.6 pg (ref 26.0–34.0)
MCHC: 34 g/dL (ref 30.0–36.0)
MCV: 84.1 fL (ref 78.0–100.0)
Platelets: 49 10*3/uL — ABNORMAL LOW (ref 150–400)
RBC: 5.11 MIL/uL (ref 4.22–5.81)
RDW: 14.6 % (ref 11.5–15.5)
WBC: 3.8 10*3/uL — ABNORMAL LOW (ref 4.0–10.5)

## 2016-03-14 LAB — GLUCOSE, CAPILLARY
Glucose-Capillary: 122 mg/dL — ABNORMAL HIGH (ref 65–99)
Glucose-Capillary: 99 mg/dL (ref 65–99)

## 2016-03-14 SURGERY — SPINAL CORD STIMULATOR BATTERY EXCHANGE
Anesthesia: General

## 2016-03-14 MED ORDER — DEXAMETHASONE SODIUM PHOSPHATE 10 MG/ML IJ SOLN
INTRAMUSCULAR | Status: DC | PRN
  Administered 2016-03-14: 4 mg via INTRAVENOUS

## 2016-03-14 MED ORDER — MEPERIDINE HCL 25 MG/ML IJ SOLN: 6.2500 mg | INTRAMUSCULAR | Status: DC | PRN

## 2016-03-14 MED ORDER — BUPIVACAINE HCL (PF) 0.25 % IJ SOLN
INTRAMUSCULAR | Status: AC
  Filled 2016-03-14: qty 30

## 2016-03-14 MED ORDER — LACTATED RINGERS IV SOLN
INTRAVENOUS | Status: DC
  Administered 2016-03-14 (×3): via INTRAVENOUS

## 2016-03-14 MED ORDER — HYDROMORPHONE HCL 1 MG/ML IJ SOLN
INTRAMUSCULAR | Status: DC
Start: 2016-03-14 — End: 2016-03-14
  Filled 2016-03-14: qty 1

## 2016-03-14 MED ORDER — BUPIVACAINE HCL (PF) 0.5 % IJ SOLN
INTRAMUSCULAR | Status: AC
  Filled 2016-03-14: qty 30

## 2016-03-14 MED ORDER — HYDROMORPHONE HCL 1 MG/ML IJ SOLN
INTRAMUSCULAR | Status: AC
  Filled 2016-03-14: qty 1

## 2016-03-14 MED ORDER — MIDAZOLAM HCL 5 MG/5ML IJ SOLN
INTRAMUSCULAR | Status: DC | PRN
  Administered 2016-03-14: 2 mg via INTRAVENOUS

## 2016-03-14 MED ORDER — PHENYLEPHRINE 40 MCG/ML (10ML) SYRINGE FOR IV PUSH (FOR BLOOD PRESSURE SUPPORT)
PREFILLED_SYRINGE | INTRAVENOUS | Status: AC
  Filled 2016-03-14: qty 10

## 2016-03-14 MED ORDER — PROMETHAZINE HCL 25 MG/ML IJ SOLN: 6.2500 mg | INTRAMUSCULAR | Status: DC | PRN

## 2016-03-14 MED ORDER — SUGAMMADEX SODIUM 500 MG/5ML IV SOLN
INTRAVENOUS | Status: DC | PRN
  Administered 2016-03-14: 300 mg via INTRAVENOUS
  Administered 2016-03-14: 100 mg via INTRAVENOUS

## 2016-03-14 MED ORDER — ONDANSETRON HCL 4 MG/2ML IJ SOLN
INTRAMUSCULAR | Status: AC
  Filled 2016-03-14: qty 2

## 2016-03-14 MED ORDER — PROPOFOL 10 MG/ML IV BOLUS
INTRAVENOUS | Status: AC
  Filled 2016-03-14: qty 20

## 2016-03-14 MED ORDER — DEXAMETHASONE SODIUM PHOSPHATE 10 MG/ML IJ SOLN
INTRAMUSCULAR | Status: AC
  Filled 2016-03-14: qty 1

## 2016-03-14 MED ORDER — HYDROMORPHONE HCL 1 MG/ML IJ SOLN
0.2500 mg | INTRAMUSCULAR | Status: DC | PRN
  Administered 2016-03-14 (×4): 0.5 mg via INTRAVENOUS

## 2016-03-14 MED ORDER — FENTANYL CITRATE (PF) 100 MCG/2ML IJ SOLN
INTRAMUSCULAR | Status: DC | PRN
  Administered 2016-03-14 (×4): 50 ug via INTRAVENOUS

## 2016-03-14 MED ORDER — BUPIVACAINE LIPOSOME 1.3 % IJ SUSP
INTRAMUSCULAR | Status: DC | PRN
  Administered 2016-03-14: 16:00:00

## 2016-03-14 MED ORDER — CEFAZOLIN SODIUM 1 G IJ SOLR
INTRAMUSCULAR | Status: DC | PRN
  Administered 2016-03-14: 3 g via INTRAMUSCULAR

## 2016-03-14 MED ORDER — MIDAZOLAM HCL 2 MG/2ML IJ SOLN: 0.5000 mg | Freq: Once | INTRAMUSCULAR | Status: DC | PRN

## 2016-03-14 MED ORDER — 0.9 % SODIUM CHLORIDE (POUR BTL) OPTIME
TOPICAL | Status: DC | PRN
  Administered 2016-03-14: 1000 mL

## 2016-03-14 MED ORDER — ONDANSETRON HCL 4 MG PO TABS: 4.0000 mg | ORAL_TABLET | Freq: Three times a day (TID) | ORAL | 0 refills | Status: DC | PRN

## 2016-03-14 MED ORDER — FENTANYL CITRATE (PF) 100 MCG/2ML IJ SOLN
INTRAMUSCULAR | Status: AC
  Filled 2016-03-14: qty 4

## 2016-03-14 MED ORDER — BUPIVACAINE-EPINEPHRINE 0.25% -1:200000 IJ SOLN
INTRAMUSCULAR | Status: DC | PRN
  Administered 2016-03-14: 30 mL

## 2016-03-14 MED ORDER — ROCURONIUM BROMIDE 100 MG/10ML IV SOLN
INTRAVENOUS | Status: DC | PRN
  Administered 2016-03-14: 50 mg via INTRAVENOUS

## 2016-03-14 MED ORDER — EPHEDRINE SULFATE 50 MG/ML IJ SOLN
INTRAMUSCULAR | Status: DC | PRN
  Administered 2016-03-14 (×2): 10 mg via INTRAVENOUS

## 2016-03-14 MED ORDER — ONDANSETRON HCL 4 MG/2ML IJ SOLN
INTRAMUSCULAR | Status: DC | PRN
  Administered 2016-03-14: 4 mg via INTRAVENOUS

## 2016-03-14 MED ORDER — BUPIVACAINE LIPOSOME 1.3 % IJ SUSP
20.0000 mL | INTRAMUSCULAR | Status: DC
  Filled 2016-03-14: qty 20

## 2016-03-14 MED ORDER — SUGAMMADEX SODIUM 500 MG/5ML IV SOLN
INTRAVENOUS | Status: AC
  Filled 2016-03-14: qty 5

## 2016-03-14 MED ORDER — METHOCARBAMOL 500 MG PO TABS: 500.0000 mg | ORAL_TABLET | Freq: Three times a day (TID) | ORAL | 0 refills | Status: DC | PRN

## 2016-03-14 MED ORDER — PROPOFOL 10 MG/ML IV BOLUS
INTRAVENOUS | Status: DC | PRN
  Administered 2016-03-14: 200 mg via INTRAVENOUS

## 2016-03-14 MED ORDER — LIDOCAINE HCL (CARDIAC) 20 MG/ML IV SOLN
INTRAVENOUS | Status: DC | PRN
  Administered 2016-03-14: 50 mg via INTRAVENOUS

## 2016-03-14 MED ORDER — MIDAZOLAM HCL 2 MG/2ML IJ SOLN
INTRAMUSCULAR | Status: AC
  Filled 2016-03-14: qty 2

## 2016-03-14 MED ORDER — EPINEPHRINE PF 1 MG/ML IJ SOLN
INTRAMUSCULAR | Status: AC
  Filled 2016-03-14: qty 1

## 2016-03-14 SURGICAL SUPPLY — 51 items
CANISTER SUCTION 2500CC (MISCELLANEOUS) ×2 IMPLANT
CLSR STERI-STRIP ANTIMIC 1/2X4 (GAUZE/BANDAGES/DRESSINGS) ×2 IMPLANT
CORDS BIPOLAR (ELECTRODE) ×2 IMPLANT
DECANTER SPIKE VIAL GLASS SM (MISCELLANEOUS) ×2 IMPLANT
DRAPE INCISE IOBAN 66X45 STRL (DRAPES) ×2 IMPLANT
DRAPE LAPAROTOMY 100X72 PEDS (DRAPES) ×2 IMPLANT
DRAPE PED LAPAROTOMY (DRAPES) ×2 IMPLANT
DRAPE POUCH INSTRU U-SHP 10X18 (DRAPES) ×2 IMPLANT
DRAPE SURG 17X23 STRL (DRAPES) ×2 IMPLANT
DRAPE U-SHAPE 47X51 STRL (DRAPES) ×2 IMPLANT
DRSG AQUACEL AG ADV 3.5X 6 (GAUZE/BANDAGES/DRESSINGS) ×2 IMPLANT
DURAPREP 26ML APPLICATOR (WOUND CARE) ×2 IMPLANT
ELECT CAUTERY BLADE 6.4 (BLADE) ×2 IMPLANT
ELECT PENCIL ROCKER SW 15FT (MISCELLANEOUS) ×2 IMPLANT
ELECT REM PT RETURN 9FT ADLT (ELECTROSURGICAL) ×2
ELECTRODE REM PT RTRN 9FT ADLT (ELECTROSURGICAL) ×1 IMPLANT
GENERATOR PULSE PROCLAIM 5ELIT (Neuro Prosthesis/Implant) ×1 IMPLANT
GLOVE BIO SURGEON STRL SZ 6.5 (GLOVE) ×2 IMPLANT
GLOVE BIOGEL PI IND STRL 6.5 (GLOVE) ×1 IMPLANT
GLOVE BIOGEL PI IND STRL 8.5 (GLOVE) ×1 IMPLANT
GLOVE BIOGEL PI INDICATOR 6.5 (GLOVE) ×1
GLOVE BIOGEL PI INDICATOR 8.5 (GLOVE) ×1
GLOVE SS BIOGEL STRL SZ 8.5 (GLOVE) ×2 IMPLANT
GLOVE SUPERSENSE BIOGEL SZ 8.5 (GLOVE) ×2
GOWN STRL REUS W/ TWL LRG LVL3 (GOWN DISPOSABLE) ×2 IMPLANT
GOWN STRL REUS W/TWL 2XL LVL3 (GOWN DISPOSABLE) ×2 IMPLANT
GOWN STRL REUS W/TWL LRG LVL3 (GOWN DISPOSABLE) ×2
KIT BASIN OR (CUSTOM PROCEDURE TRAY) ×2 IMPLANT
KIT ROOM TURNOVER OR (KITS) ×2 IMPLANT
NDL SUT 6 .5 CRC .975X.05 MAYO (NEEDLE) ×1 IMPLANT
NEEDLE 22X1 1/2 (OR ONLY) (NEEDLE) IMPLANT
NEEDLE MAYO TAPER (NEEDLE) ×1
NS IRRIG 1000ML POUR BTL (IV SOLUTION) ×2 IMPLANT
PACK GENERAL/GYN (CUSTOM PROCEDURE TRAY) ×2 IMPLANT
PACK UNIVERSAL I (CUSTOM PROCEDURE TRAY) ×2 IMPLANT
PAD ARMBOARD 7.5X6 YLW CONV (MISCELLANEOUS) ×4 IMPLANT
PULSE GENERATOR PROCLAIM 5ELIT (Neuro Prosthesis/Implant) ×2 IMPLANT
SPONGE LAP 4X18 X RAY DECT (DISPOSABLE) ×2 IMPLANT
STAPLER VISISTAT 35W (STAPLE) IMPLANT
STRIP CLOSURE SKIN 1/2X4 (GAUZE/BANDAGES/DRESSINGS) ×2 IMPLANT
SUT ETHIBOND NAB CT1 #1 30IN (SUTURE) ×4 IMPLANT
SUT MON AB 3-0 SH 27 (SUTURE) ×1
SUT MON AB 3-0 SH27 (SUTURE) ×1 IMPLANT
SUT VIC AB 1 CT1 18XCR BRD 8 (SUTURE) ×1 IMPLANT
SUT VIC AB 1 CT1 8-18 (SUTURE) ×1
SUT VIC AB 2-0 CT1 18 (SUTURE) IMPLANT
SYR BULB IRRIGATION 50ML (SYRINGE) ×2 IMPLANT
SYR CONTROL 10ML LL (SYRINGE) IMPLANT
TOWEL OR 17X24 6PK STRL BLUE (TOWEL DISPOSABLE) ×4 IMPLANT
TOWEL OR 17X26 10 PK STRL BLUE (TOWEL DISPOSABLE) ×2 IMPLANT
WATER STERILE IRR 1000ML POUR (IV SOLUTION) IMPLANT

## 2016-03-14 NOTE — Progress Notes (Signed)
Patient ambulated around PACU X1 tolerated it well. Discharge instructions reviewed with patient and wife. No questions at this time, prescriptions given to wife.

## 2016-03-14 NOTE — Transfer of Care (Signed)
Immediate Anesthesia Transfer of Care Note  Patient: Victor [name]  Procedure(s) Performed: Procedure(s) with comments: Revision of spinal cord stimulator battery (N/A) - Requests 1 hour  Patient Location: PACU  Anesthesia Type:General  Level of Consciousness: awake and alert   Airway & Oxygen Therapy: Patient Spontanous Breathing and Patient connected to face mask oxygen  Post-op Assessment: Report given to RN, Post -op Vital signs reviewed and stable and Patient moving all extremities X 4  Post vital signs: Reviewed and stable  Last Vitals:  Vitals:   03/14/16 1344 03/14/16 1703  BP: (!) 151/66   Pulse:    Resp:    Temp:  (P) 36.4 C    Last Pain:  Vitals:   03/14/16 1325  TempSrc: Oral  PainSc:       Patients Stated Pain Goal: 4 ([id number])  Complications: No apparent anesthesia complications

## 2016-03-14 NOTE — Anesthesia Postprocedure Evaluation (Addendum)
Anesthesia Post Note  Patient: Victor [name]  Procedure(s) Performed: Procedure(s) (LRB): Revision of spinal cord stimulator battery (N/A)  Patient location during evaluation: PACU Anesthesia Type: General Level of consciousness: awake, awake and alert and oriented Pain management: pain level controlled Vital Signs Assessment: post-procedure vital signs reviewed and stable Respiratory status: spontaneous breathing, nonlabored ventilation and respiratory function stable Cardiovascular status: blood pressure returned to baseline Anesthetic complications: no       Last Vitals:  Vitals:   03/14/16 1730 03/14/16 1733  BP:  (!) 168/92  Pulse: 97 93  Resp: 19 15  Temp:      Last Pain:  Vitals:   03/14/16 1725  TempSrc:   PainSc: 7     LLE Motor Response: Purposeful movement;Responds to commands (03/14/16 1733) LLE Sensation: Full sensation;No numbness;No pain;No tingling (03/14/16 1733) RLE Motor Response: Purposeful movement;Responds to commands (03/14/16 1733) RLE Sensation: Full sensation;No numbness;No pain;No tingling (03/14/16 1733)      Byanca Kasper COKER

## 2016-03-14 NOTE — H&P (Signed)
History of Present Illness  The patient is a 56 year old male who presents for a follow-up for H & P. The patient is scheduled for a revision of SCS battery to be performed by Dr. Duane Lope D. Rolena Infante, MD at [place] on 03-12-16 . Please see the hospital record for complete dictated history and physical. The pt has DM2. the pt reports his last A1c was 7.8. His medication has been adjusted since then. the pt reports having sleep apnea.  Problem List/Past Medical Chronic pain syndrome   Lumbar/Lumbosacral Disc Degeneration  Encounter for long-term use of opiate analgesic  Foreign body of right hand, initial encounter   Postlaminectomy syndrome of lumbar region   Thoracic back pain Encounter for other orthopedic aftercare  Chronic bilateral low back pain with left-sided sciatica Problems Reconciled   Allergies  Flexeril *MUSCULOSKELETAL THERAPY AGENTS*  Vicodin *ANALGESICS - OPIOID*  Tanzeum *ANTIDIABETICS*  Allergies Reconciled   Family History  Hypertension  father Cancer  First Degree Relatives. mother Cerebrovascular Accident  Father. father  Social History Tobacco use  Former smoker. 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 Invokamet (150-1000MG  Tablet, Oral) Active. Amoxicillin (500MG  Capsule, Oral) Active. (qid following a dental procedure) Vitamin C (1000MG  Tablet,  Oral) Active. (400 units qd) Zaleplon (10MG  Capsule, Oral) Active. (1-2 qhs) Toujeo SoloStar (300UNIT/ML Soln Pen-inj, Subcutaneous) Active. (80 units qd) Pantoprazole Sodium (40MG  Tablet DR, Oral) Active. (qd) Emsam (6MG /24HR Patch 24HR, Transdermal) Active. (qd) Atorvastatin Calcium (40MG  Tablet, Oral) Active. (qd) Flomax (0.4MG  Capsule ER, Oral) Active. NexIUM (40MG  Packet, Oral) Active. Xanax (2MG  Tablet, Oral) Active. (1-2 qd prn) Atenolol (25MG  Tablet, Oral) Active. (qd) Fish Oil (1000MG  Capsule, Oral) Active. (qd) Lisinopril (2.5MG  Tablet, Oral) Active. (qd) Medications Reconciled  Past Surgical History  Other Orthopaedic Surgery  Lumbar Surgery 02/19/11 Other Surgery  RT HAND LIGIMATION Rotator Cuff Repair  left Arthroscopy of Knee  bilateral  Other Problems  Depression  Gastroesophageal Reflux Disease  Chronic Pain  Liver disease  Diabetes Mellitus, Type II  Anxiety Disorder  Kidney Stone  Hypercholesterolemia  Hepatitis B  High blood pressure   Vitals  03/06/2016 10:16 AM Weight: 280 lb Height: 72in Body Surface Area: 2.46 m Body Mass Index: 37.97 kg/m  Temp.: 98.15F(Oral)  Pulse: 60 (Regular)  BP: 146/79 (Sitting, Right Arm, Standard)  General General Appearance-Not in acute distress. Orientation-Oriented X3. Build & Nutrition-Well nourished and Well developed.  Integumentary General Characteristics Surgical Scars - no surgical scar evidence of previous lumbar surgery. Lumbar Spine-Skin examination of the lumbar spine is without deformity, skin lesions, lacerations or abrasions.  Chest and Lung Exam Auscultation Breath sounds - Normal and Clear.  Cardiovascular Auscultation Rhythm - Regular rate and rhythm.  Abdomen Palpation/Percussion Palpation and Percussion of the abdomen reveal - Soft, Non Tender and No Rebound tenderness.  Peripheral Vascular Lower Extremity Palpation - Posterior tibial pulse  - Bilateral - 2+. Dorsalis pedis  pulse - Bilateral - 2+.  Neurologic Sensation Lower Extremity - Bilateral - sensation is intact in the lower extremity. Reflexes Patellar Reflex - Bilateral - 2+. Achilles Reflex - Bilateral - 2+. Clonus - Bilateral - clonus not present. Hoffman's Sign - Bilateral - Hoffman's sign not present. Testing Seated Straight Leg Raise - Bilateral - Seated straight leg raise negative.  Musculoskeletal Spine/Ribs/Pelvis  Lumbosacral Spine: Inspection and Palpation - Tenderness - left lumbar paraspinals tender to palpation and right lumbar paraspinals tender to palpation. Strength and Tone: Strength - Hip Flexion - Bilateral - 5/5. Knee Extension - Bilateral - 5/5. Knee Flexion - Bilateral - 5/5. Ankle Dorsiflexion - Bilateral - 5/5. Ankle Plantarflexion - Bilateral - 5/5. Heel walk - Bilateral - able to heel walk without difficulty. Toe Walk - Bilateral - able to walk on toes without difficulty. Heel-Toe Walk - Bilateral - able to heel-toe walk without difficulty. ROM - Flexion - moderately decreased range of motion and painful. Extension - moderately decreased range of motion and painful. Left Lateral Bending - moderately decreased range of motion and painful. Right Lateral Bending - moderately decreased range of motion and painful. Right Rotation - moderately decreased range of motion and painful. Left Rotation - moderately decreased range of motion and painful. Pain - neither flexion or extension is more painful than the other. Lumbosacral Spine - Waddell's Signs - no Waddell's signs present. Lower Extremity Range of Motion - No true hip, knee or ankle pain with range of motion. Gait and Station - [employer] - cane.  He returns today for followup. He states over the last three months he has been having significant left battery site pain. Clinically he has pain with palpation of battery. It does not seem to be rotated. He has pain when he sits directly down on it or  lays down on that left side. He has no discomfort when he is standing. He is neurologically intact. He states the battery is working. He can feel the relief that it provide. It is just the battery site is tender and painful over the last three months. Will plan on relocating the battery for pain relief.  Reviewed all risks and benefits.

## 2016-03-14 NOTE — Brief Op Note (Signed)
03/14/2016  4:44 PM  PATIENT:  Victor [name]  55 y.o. male  PRE-OPERATIVE DIAGNOSIS:  Symptomatic spinal cord stimulator battery  POST-OPERATIVE DIAGNOSIS:  Symptomatic spinal cord stimulator battery  PROCEDURE:  Procedure(s) with comments: Revision of spinal cord stimulator battery (N/A) - Requests 1 hour  SURGEON:  Surgeon(s) and Role:    * Melina Schools, MD - Primary  PHYSICIAN ASSISTANT:   ASSISTANTS: Carmen Mayo   ANESTHESIA:   general  EBL:  Total I/O In: 1300 [I.V.:1300] Out: 10 [Blood:10]  BLOOD ADMINISTERED:none  DRAINS: none   LOCAL MEDICATIONS USED:  MARCAINE    and OTHER exparil  SPECIMEN:  No Specimen  DISPOSITION OF SPECIMEN:  N/A  COUNTS:  YES  TOURNIQUET:  * No tourniquets in log *  DICTATION: .Other Dictation: Dictation Number [id number]  PLAN OF CARE: Discharge to home after PACU  PATIENT DISPOSITION:  PACU - hemodynamically stable.

## 2016-03-14 NOTE — Anesthesia Preprocedure Evaluation (Addendum)
Anesthesia Evaluation  Patient identified by MRN, date of birth, ID band Patient awake    Reviewed: Allergy & Precautions, NPO status , Patient's Chart, lab work & pertinent test results, reviewed documented beta blocker date and time   History of Anesthesia Complications Negative for: history of anesthetic complications  Airway Mallampati: II  TM Distance: >3 FB Neck ROM: Full    Dental  (+) Poor Dentition, Missing, Chipped, Dental Advisory Given   Pulmonary sleep apnea and Continuous Positive Airway Pressure Ventilation , former smoker,    breath sounds clear to auscultation       Cardiovascular hypertension, Pt. on medications and Pt. on home beta blockers (-) angina Rhythm:Regular Rate:Normal     Neuro/Psych Anxiety Chronic back pain    GI/Hepatic GERD  Medicated and Controlled,(+) Hepatitis -, B  Endo/Other  diabetes (glu 122), Insulin DependentMorbid obesity  Renal/GU negative Renal ROS     Musculoskeletal  (+) Arthritis , Osteoarthritis,    Abdominal (+) + obese,   Peds  Hematology  (+) Blood dyscrasia (thrombocytopenia: plt 49k), ,   Anesthesia Other Findings   Reproductive/Obstetrics                            Anesthesia Physical Anesthesia Plan  ASA: III  Anesthesia Plan: General   Post-op Pain Management:    Induction:   Airway Management Planned: Oral ETT  Additional Equipment:   Intra-op Plan:   Post-operative Plan: Extubation in OR  Informed Consent: I have reviewed the patients History and Physical, chart, labs and discussed the procedure including the risks, benefits and alternatives for the proposed anesthesia with the patient or authorized representative who has indicated his/her understanding and acceptance.   Dental advisory given  Plan Discussed with: CRNA and Surgeon  Anesthesia Plan Comments: (Plan routine monitors, GETA)        Anesthesia  Quick Evaluation

## 2016-03-14 NOTE — Discharge Instructions (Signed)
1. Continue current pain medical management 2. Ok to shower in 5 days 3. Contact pain management doctor for refill of pain medications 4. Call to set up follow up appointment in 2 weeks 5. Contact [employer] rep (Maggie) to adjust SCS program

## 2016-03-14 NOTE — Anesthesia Procedure Notes (Signed)
Procedure Name: Intubation Date/Time: 03/14/2016 3:58 PM Performed by: Rejeana Brock L Pre-anesthesia Checklist: Patient identified, Emergency Drugs available, Suction available and Patient being monitored Patient Re-evaluated:Patient Re-evaluated prior to inductionOxygen Delivery Method: Circle System Utilized Preoxygenation: Pre-oxygenation with 100% oxygen Intubation Type: IV induction Ventilation: Mask ventilation without difficulty and Oral airway inserted - appropriate to patient size Laryngoscope Size: Glidescope and 4 Grade View: Grade I Tube type: Oral Tube size: 7.5 mm Number of attempts: 1 Airway Equipment and Method: Stylet and Oral airway Placement Confirmation: ETT inserted through vocal cords under direct vision,  positive ETCO2 and breath sounds checked- equal and bilateral Secured at: 22 cm Tube secured with: Tape Dental Injury: Teeth and Oropharynx as per pre-operative assessment

## 2016-03-15 ENCOUNTER — Encounter (HOSPITAL_COMMUNITY): Payer: Self-pay | Admitting: Orthopedic Surgery

## 2016-03-15 NOTE — Op Note (Signed)
NAME:  CLOISE, CAMBARERI NO.:  [id number]  MEDICAL RECORD NO.:  [id number]  LOCATION:                                 FACILITY:  PHYSICIAN:  Babe Anthis D. Rolena Infante, M.D. DATE OF BIRTH:  1960-09-21  DATE OF PROCEDURE:  03/14/2016 DATE OF DISCHARGE:                              OPERATIVE REPORT   PREOPERATIVE DIAGNOSIS:  Symptomatic hardware.  POSTOPERATIVE DIAGNOSIS:  Symptomatic hardware.  OPERATIVE PROCEDURE:  Removal and implantation of new spinal cord stimulator battery.  COMPLICATIONS:  None.  CONDITION:  Stable.  FIRST ASSISTANT:  Ronette Deter, [place].  HISTORY:  This is a very pleasant 56 year old gentleman, who about a year and a half ago had a spinal cord stimulator placed.  The battery began giving him some issues including significant pain.  He felt as though he was not getting adequate coverage from the battery and so he presented to have the battery repositioned and replaced.  After discussing all appropriate risks, benefits, and alternatives, we elected to proceed with the aforementioned case.  OPERATIVE NOTE:  The patient was brought to the operating room and placed supine on the operating table.  After successful induction of general anesthesia and endotracheal intubation, TEDs and SCDs were applied.  He was turned prone onto the Santiago frame.  Prior to intubation, he did request that a small skin tag be removed from the right buttocks and I did agree.  The back was prepped and draped in a standard fashion.  A time-out was taken confirming patient, procedure, and all other pertinent important data.  The skin tag was identified and removed with Bovie.  The previous incision site from his battery was infiltrated with a total of 30 mL of Marcaine mixed with Exparel.  I then re-incised and sharply dissected down to the deep fascia.  I identified the battery and brought it out of the body.  I disconnected the leads, obtained a new battery and connected  it up, and torqued the locking nuts.  I then created a new cavity going more superior and placed the battery into the cavity.  I then used #2, #1 Ethibond sutures to secure to the deep fascia.  Care was taken not to violate or compress the wires.  I irrigated the wound copiously with normal saline and then we tested the battery.  According to the rep, it was functioning without any issues.  After copiously irrigating again, I then closed the deep fascia with running interrupted #1 Vicryl sutures, then a layer of 2-0 Vicryl sutures, and 3-0 Monocryl.  Steri-Strips and a dry dressing were applied, and another 20 mL of Marcaine mixed with Exparel was injected for postoperative analgesia.  A dry dressing was applied.  The patient was extubated and transferred to the PACU without difficulty.     Matheson Vandehei D. Rolena Infante, M.D.   ______________________________ Blake Divine. Rolena Infante, M.D.    DDB/MEDQ  D:  03/14/2016  T:  03/15/2016  Job:  [id number]

## 2016-03-23 ENCOUNTER — Telehealth: Payer: Self-pay | Admitting: *Deleted

## 2016-03-23 NOTE — Telephone Encounter (Signed)
-----   Message from Sueanne Margarita, MD sent at 03/22/2016  3:31 PM EST ----- Good AHI and compliance.  Continue current CPAP settings.

## 2016-03-23 NOTE — Telephone Encounter (Signed)
Called the patient and gave him his results, he verbalized understanding and agreed 

## 2016-03-26 DIAGNOSIS — Z4789 Encounter for other orthopedic aftercare: Secondary | ICD-10-CM | POA: Diagnosis not present

## 2016-03-26 DIAGNOSIS — Z9889 Other specified postprocedural states: Secondary | ICD-10-CM | POA: Diagnosis not present

## 2016-04-17 DIAGNOSIS — E785 Hyperlipidemia, unspecified: Secondary | ICD-10-CM | POA: Diagnosis not present

## 2016-04-17 DIAGNOSIS — Z794 Long term (current) use of insulin: Secondary | ICD-10-CM | POA: Diagnosis not present

## 2016-04-17 DIAGNOSIS — I10 Essential (primary) hypertension: Secondary | ICD-10-CM | POA: Diagnosis not present

## 2016-04-17 DIAGNOSIS — E1140 Type 2 diabetes mellitus with diabetic neuropathy, unspecified: Secondary | ICD-10-CM | POA: Diagnosis not present

## 2016-04-17 DIAGNOSIS — E1165 Type 2 diabetes mellitus with hyperglycemia: Secondary | ICD-10-CM | POA: Diagnosis not present

## 2016-04-17 DIAGNOSIS — E1159 Type 2 diabetes mellitus with other circulatory complications: Secondary | ICD-10-CM | POA: Diagnosis not present

## 2016-05-08 DIAGNOSIS — G8929 Other chronic pain: Secondary | ICD-10-CM | POA: Diagnosis not present

## 2016-05-08 DIAGNOSIS — Z4789 Encounter for other orthopedic aftercare: Secondary | ICD-10-CM | POA: Diagnosis not present

## 2016-05-08 DIAGNOSIS — M5442 Lumbago with sciatica, left side: Secondary | ICD-10-CM | POA: Diagnosis not present

## 2016-05-15 DIAGNOSIS — G894 Chronic pain syndrome: Secondary | ICD-10-CM | POA: Diagnosis not present

## 2016-06-01 DIAGNOSIS — M7541 Impingement syndrome of right shoulder: Secondary | ICD-10-CM | POA: Diagnosis not present

## 2016-06-01 DIAGNOSIS — M25511 Pain in right shoulder: Secondary | ICD-10-CM | POA: Diagnosis not present

## 2016-06-04 DIAGNOSIS — E1165 Type 2 diabetes mellitus with hyperglycemia: Secondary | ICD-10-CM | POA: Diagnosis not present

## 2016-06-04 DIAGNOSIS — Z794 Long term (current) use of insulin: Secondary | ICD-10-CM | POA: Diagnosis not present

## 2016-06-04 DIAGNOSIS — E1140 Type 2 diabetes mellitus with diabetic neuropathy, unspecified: Secondary | ICD-10-CM | POA: Diagnosis not present

## 2016-06-04 DIAGNOSIS — I10 Essential (primary) hypertension: Secondary | ICD-10-CM | POA: Diagnosis not present

## 2016-07-16 DIAGNOSIS — Z794 Long term (current) use of insulin: Secondary | ICD-10-CM | POA: Diagnosis not present

## 2016-07-16 DIAGNOSIS — E1159 Type 2 diabetes mellitus with other circulatory complications: Secondary | ICD-10-CM | POA: Diagnosis not present

## 2016-07-16 DIAGNOSIS — I10 Essential (primary) hypertension: Secondary | ICD-10-CM | POA: Diagnosis not present

## 2016-07-16 DIAGNOSIS — E1169 Type 2 diabetes mellitus with other specified complication: Secondary | ICD-10-CM | POA: Diagnosis not present

## 2016-07-16 DIAGNOSIS — E1165 Type 2 diabetes mellitus with hyperglycemia: Secondary | ICD-10-CM | POA: Diagnosis not present

## 2016-07-16 DIAGNOSIS — E785 Hyperlipidemia, unspecified: Secondary | ICD-10-CM | POA: Diagnosis not present

## 2016-07-16 DIAGNOSIS — E1140 Type 2 diabetes mellitus with diabetic neuropathy, unspecified: Secondary | ICD-10-CM | POA: Diagnosis not present

## 2016-08-02 DIAGNOSIS — E782 Mixed hyperlipidemia: Secondary | ICD-10-CM | POA: Diagnosis not present

## 2016-08-02 DIAGNOSIS — E1165 Type 2 diabetes mellitus with hyperglycemia: Secondary | ICD-10-CM | POA: Diagnosis not present

## 2016-08-02 DIAGNOSIS — G4700 Insomnia, unspecified: Secondary | ICD-10-CM | POA: Diagnosis not present

## 2016-08-02 DIAGNOSIS — Z7984 Long term (current) use of oral hypoglycemic drugs: Secondary | ICD-10-CM | POA: Diagnosis not present

## 2016-08-02 DIAGNOSIS — K219 Gastro-esophageal reflux disease without esophagitis: Secondary | ICD-10-CM | POA: Diagnosis not present

## 2016-08-02 DIAGNOSIS — I10 Essential (primary) hypertension: Secondary | ICD-10-CM | POA: Diagnosis not present

## 2016-08-02 DIAGNOSIS — Z6841 Body Mass Index (BMI) 40.0 and over, adult: Secondary | ICD-10-CM | POA: Diagnosis not present

## 2016-08-02 DIAGNOSIS — E781 Pure hyperglyceridemia: Secondary | ICD-10-CM | POA: Diagnosis not present

## 2016-08-02 DIAGNOSIS — F411 Generalized anxiety disorder: Secondary | ICD-10-CM | POA: Diagnosis not present

## 2016-08-02 DIAGNOSIS — D696 Thrombocytopenia, unspecified: Secondary | ICD-10-CM | POA: Diagnosis not present

## 2016-08-06 DIAGNOSIS — F331 Major depressive disorder, recurrent, moderate: Secondary | ICD-10-CM | POA: Diagnosis not present

## 2016-08-21 ENCOUNTER — Other Ambulatory Visit: Payer: Self-pay | Admitting: Physical Medicine and Rehabilitation

## 2016-08-21 DIAGNOSIS — M5442 Lumbago with sciatica, left side: Secondary | ICD-10-CM | POA: Diagnosis not present

## 2016-08-21 DIAGNOSIS — M7541 Impingement syndrome of right shoulder: Secondary | ICD-10-CM

## 2016-08-21 DIAGNOSIS — G8929 Other chronic pain: Secondary | ICD-10-CM | POA: Diagnosis not present

## 2016-08-21 DIAGNOSIS — M961 Postlaminectomy syndrome, not elsewhere classified: Secondary | ICD-10-CM | POA: Diagnosis not present

## 2016-08-23 ENCOUNTER — Ambulatory Visit
Admission: RE | Admit: 2016-08-23 | Discharge: 2016-08-23 | Disposition: A | Discharge status: Home / Self Care | Payer: Medicare Other | Source: Ambulatory Visit | Attending: Physical Medicine and Rehabilitation | Admitting: Physical Medicine and Rehabilitation

## 2016-08-23 DIAGNOSIS — M7541 Impingement syndrome of right shoulder: Secondary | ICD-10-CM

## 2016-08-29 DIAGNOSIS — M7501 Adhesive capsulitis of right shoulder: Secondary | ICD-10-CM | POA: Diagnosis not present

## 2016-08-29 DIAGNOSIS — E11618 Type 2 diabetes mellitus with other diabetic arthropathy: Secondary | ICD-10-CM | POA: Diagnosis not present

## 2016-08-29 DIAGNOSIS — M7541 Impingement syndrome of right shoulder: Secondary | ICD-10-CM | POA: Diagnosis not present

## 2016-08-30 DIAGNOSIS — M7541 Impingement syndrome of right shoulder: Secondary | ICD-10-CM | POA: Diagnosis not present

## 2016-09-06 DIAGNOSIS — M25511 Pain in right shoulder: Secondary | ICD-10-CM | POA: Diagnosis not present

## 2016-09-10 DIAGNOSIS — M25511 Pain in right shoulder: Secondary | ICD-10-CM | POA: Diagnosis not present

## 2016-09-13 DIAGNOSIS — M25511 Pain in right shoulder: Secondary | ICD-10-CM | POA: Diagnosis not present

## 2016-09-25 DIAGNOSIS — M25511 Pain in right shoulder: Secondary | ICD-10-CM | POA: Diagnosis not present

## 2016-09-27 DIAGNOSIS — M25511 Pain in right shoulder: Secondary | ICD-10-CM | POA: Diagnosis not present

## 2016-10-04 NOTE — Addendum Note (Signed)
Addendum  created 10/04/16 1026 by Roberts Gaudy, MD   Sign clinical note

## 2016-10-05 DIAGNOSIS — M25511 Pain in right shoulder: Secondary | ICD-10-CM | POA: Diagnosis not present

## 2016-10-08 DIAGNOSIS — M25511 Pain in right shoulder: Secondary | ICD-10-CM | POA: Diagnosis not present

## 2016-10-16 DIAGNOSIS — Z794 Long term (current) use of insulin: Secondary | ICD-10-CM | POA: Diagnosis not present

## 2016-10-16 DIAGNOSIS — I10 Essential (primary) hypertension: Secondary | ICD-10-CM | POA: Diagnosis not present

## 2016-10-16 DIAGNOSIS — E1140 Type 2 diabetes mellitus with diabetic neuropathy, unspecified: Secondary | ICD-10-CM | POA: Diagnosis not present

## 2016-10-16 DIAGNOSIS — E1169 Type 2 diabetes mellitus with other specified complication: Secondary | ICD-10-CM | POA: Diagnosis not present

## 2016-10-16 DIAGNOSIS — E785 Hyperlipidemia, unspecified: Secondary | ICD-10-CM | POA: Diagnosis not present

## 2016-10-16 DIAGNOSIS — E1159 Type 2 diabetes mellitus with other circulatory complications: Secondary | ICD-10-CM | POA: Diagnosis not present

## 2016-10-16 DIAGNOSIS — E1165 Type 2 diabetes mellitus with hyperglycemia: Secondary | ICD-10-CM | POA: Diagnosis not present

## 2016-11-07 DIAGNOSIS — E11618 Type 2 diabetes mellitus with other diabetic arthropathy: Secondary | ICD-10-CM | POA: Diagnosis not present

## 2016-11-07 DIAGNOSIS — M7501 Adhesive capsulitis of right shoulder: Secondary | ICD-10-CM | POA: Diagnosis not present

## 2016-11-07 DIAGNOSIS — M7541 Impingement syndrome of right shoulder: Secondary | ICD-10-CM | POA: Diagnosis not present

## 2016-11-13 DIAGNOSIS — Z23 Encounter for immunization: Secondary | ICD-10-CM | POA: Diagnosis not present

## 2016-11-13 DIAGNOSIS — G8929 Other chronic pain: Secondary | ICD-10-CM | POA: Diagnosis not present

## 2016-11-13 DIAGNOSIS — M25511 Pain in right shoulder: Secondary | ICD-10-CM | POA: Diagnosis not present

## 2016-11-14 DIAGNOSIS — E11618 Type 2 diabetes mellitus with other diabetic arthropathy: Secondary | ICD-10-CM | POA: Diagnosis not present

## 2016-11-14 DIAGNOSIS — M7541 Impingement syndrome of right shoulder: Secondary | ICD-10-CM | POA: Diagnosis not present

## 2016-11-14 DIAGNOSIS — M7501 Adhesive capsulitis of right shoulder: Secondary | ICD-10-CM | POA: Diagnosis not present

## 2016-11-21 DIAGNOSIS — G8929 Other chronic pain: Secondary | ICD-10-CM | POA: Diagnosis not present

## 2016-11-21 DIAGNOSIS — M546 Pain in thoracic spine: Secondary | ICD-10-CM | POA: Diagnosis not present

## 2016-11-21 DIAGNOSIS — Z79891 Long term (current) use of opiate analgesic: Secondary | ICD-10-CM | POA: Diagnosis not present

## 2016-11-21 DIAGNOSIS — M961 Postlaminectomy syndrome, not elsewhere classified: Secondary | ICD-10-CM | POA: Diagnosis not present

## 2016-11-21 DIAGNOSIS — M25511 Pain in right shoulder: Secondary | ICD-10-CM | POA: Diagnosis not present

## 2016-11-29 ENCOUNTER — Encounter (HOSPITAL_COMMUNITY): Payer: Self-pay | Admitting: *Deleted

## 2016-11-29 NOTE — Progress Notes (Signed)
Victor [name] has Type II DM, he reports that am CBG was 165.  Patient had  A1C drawn on 10/16/16 at Dr Hartford Poli, his endocrinologist office. Patient reports that 2 weeks ago he started on Novolin Insulin  Because he is now on Medicare and it will not pay for Trujeo. I instructed patient to not take Metformin in am and to take 1/2 scheduled Novolog tonight and in am, hold am Novolog if CBG , 70.   I instructed patient to check CBG after awaking and every 2 hours until arrival  to the hospital.  I Instructed patient if CBG is less than 70 to drink 1/2 cup of a clear juice. Recheck CBG in 15 minutes then call pre- op desk at [phone] for further instructions. If scheduled to receive Insulin, do not take Insulin

## 2016-11-29 NOTE — Progress Notes (Signed)
Anesthesia Chart Review: SAME DAY WORK-UP.  Patient is a 56 year old male scheduled for right shoulder arthroscopy, extensive debridement and subacromial decompression on 11/30/16 by Dr. Victorino December.     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 Difficile colitis 03/2015, nephrolithiasis, former smoker, L5-S1 microdiscectomy and L5 hemilaminectomy '13, spinal cord stimulator 02/02/15 with revision 03/14/16, cholecystectomy 04/26/15, obesity.    - PCP is Dr. Antony Contras. Reportedly, he monitors patient's thrombocytopenia. - Cardiologist is Dr. Fransico Him (sees for OSA and HTN). Last visit 01/20/16. - Endocrinologist is Dr. Francetta Found [facility]), last visit 10/16/16.   Medications include: Xanax, atenolol, Lipitor, hydromorphone, lisinopril, insulin NPH, metformin, fish oil, Protonix, trazodone, vitamin E.   EKG 03/14/16: NSR. Inverted P waves II, aVF. Non-specific inferior T wave abnormality. Borderline prolonged PR interval. I think EKG appears stable when compared to 05/19/14 tracing.   He will need labs on arrival. A1c on 10/16/16 was 6.7 [facility], [facility]). Patient with known liver disease with chronic thrombocytopenia (primarily in the 50-60K range since 2013, last 49K on 03/14/16 prior to spinal cord stimulator revision.). I will add T&S and HFP to his morning labs given his history. Defer decision for perioperatively platelets to surgeon and/or anesthesiologist. (Voice message regarding thrombocytopenia history left with Judeen Hammans at Dr. Dennie Maizes office--although should be a known problem since he has had surgery through this practice in the recent past.)  [facility]/Anesthesiology Phone [phone] 11/29/2016 12:40 PM

## 2016-11-30 ENCOUNTER — Encounter (HOSPITAL_COMMUNITY)
Admission: RE | Disposition: A | Discharge status: Home / Self Care | Payer: Self-pay | Source: Ambulatory Visit | Attending: Orthopedic Surgery

## 2016-11-30 ENCOUNTER — Ambulatory Visit (HOSPITAL_COMMUNITY): Payer: Medicare Other | Admitting: Vascular Surgery

## 2016-11-30 ENCOUNTER — Observation Stay (HOSPITAL_COMMUNITY)
Admission: RE | Admit: 2016-11-30 | Discharge: 2016-12-01 | Disposition: A | Discharge status: Home / Self Care | Payer: Medicare Other | Source: Ambulatory Visit | Attending: Orthopedic Surgery | Admitting: Orthopedic Surgery

## 2016-11-30 ENCOUNTER — Encounter (HOSPITAL_COMMUNITY): Payer: Self-pay | Admitting: *Deleted

## 2016-11-30 DIAGNOSIS — Z87891 Personal history of nicotine dependence: Secondary | ICD-10-CM | POA: Insufficient documentation

## 2016-11-30 DIAGNOSIS — Z809 Family history of malignant neoplasm, unspecified: Secondary | ICD-10-CM | POA: Diagnosis not present

## 2016-11-30 DIAGNOSIS — F329 Major depressive disorder, single episode, unspecified: Secondary | ICD-10-CM | POA: Diagnosis not present

## 2016-11-30 DIAGNOSIS — M7521 Bicipital tendinitis, right shoulder: Secondary | ICD-10-CM | POA: Diagnosis not present

## 2016-11-30 DIAGNOSIS — F419 Anxiety disorder, unspecified: Secondary | ICD-10-CM | POA: Diagnosis not present

## 2016-11-30 DIAGNOSIS — M25511 Pain in right shoulder: Secondary | ICD-10-CM

## 2016-11-30 DIAGNOSIS — D696 Thrombocytopenia, unspecified: Secondary | ICD-10-CM | POA: Insufficient documentation

## 2016-11-30 DIAGNOSIS — I10 Essential (primary) hypertension: Secondary | ICD-10-CM | POA: Diagnosis not present

## 2016-11-30 DIAGNOSIS — M19011 Primary osteoarthritis, right shoulder: Secondary | ICD-10-CM | POA: Diagnosis not present

## 2016-11-30 DIAGNOSIS — Z885 Allergy status to narcotic agent status: Secondary | ICD-10-CM | POA: Insufficient documentation

## 2016-11-30 DIAGNOSIS — G8918 Other acute postprocedural pain: Secondary | ICD-10-CM | POA: Diagnosis not present

## 2016-11-30 DIAGNOSIS — Z79899 Other long term (current) drug therapy: Secondary | ICD-10-CM | POA: Insufficient documentation

## 2016-11-30 DIAGNOSIS — M7541 Impingement syndrome of right shoulder: Secondary | ICD-10-CM | POA: Insufficient documentation

## 2016-11-30 DIAGNOSIS — K219 Gastro-esophageal reflux disease without esophagitis: Secondary | ICD-10-CM | POA: Insufficient documentation

## 2016-11-30 DIAGNOSIS — Z794 Long term (current) use of insulin: Secondary | ICD-10-CM | POA: Insufficient documentation

## 2016-11-30 DIAGNOSIS — Z8619 Personal history of other infectious and parasitic diseases: Secondary | ICD-10-CM | POA: Diagnosis not present

## 2016-11-30 DIAGNOSIS — Z886 Allergy status to analgesic agent status: Secondary | ICD-10-CM | POA: Insufficient documentation

## 2016-11-30 DIAGNOSIS — M75101 Unspecified rotator cuff tear or rupture of right shoulder, not specified as traumatic: Secondary | ICD-10-CM | POA: Diagnosis not present

## 2016-11-30 DIAGNOSIS — G4733 Obstructive sleep apnea (adult) (pediatric): Secondary | ICD-10-CM | POA: Diagnosis not present

## 2016-11-30 DIAGNOSIS — E119 Type 2 diabetes mellitus without complications: Secondary | ICD-10-CM | POA: Insufficient documentation

## 2016-11-30 DIAGNOSIS — Z888 Allergy status to other drugs, medicaments and biological substances status: Secondary | ICD-10-CM | POA: Insufficient documentation

## 2016-11-30 DIAGNOSIS — M7501 Adhesive capsulitis of right shoulder: Secondary | ICD-10-CM | POA: Diagnosis not present

## 2016-11-30 DIAGNOSIS — E785 Hyperlipidemia, unspecified: Secondary | ICD-10-CM | POA: Insufficient documentation

## 2016-11-30 DIAGNOSIS — G8929 Other chronic pain: Secondary | ICD-10-CM | POA: Diagnosis present

## 2016-11-30 DIAGNOSIS — Z6837 Body mass index (BMI) 37.0-37.9, adult: Secondary | ICD-10-CM | POA: Diagnosis not present

## 2016-11-30 HISTORY — PX: SHOULDER ARTHROSCOPY WITH SUBACROMIAL DECOMPRESSION: SHX5684

## 2016-11-30 LAB — CBC
HCT: 40.4 % (ref 39.0–52.0)
Hemoglobin: 14.9 g/dL (ref 13.0–17.0)
MCH: 31.2 pg (ref 26.0–34.0)
MCHC: 36.9 g/dL — ABNORMAL HIGH (ref 30.0–36.0)
MCV: 84.7 fL (ref 78.0–100.0)
Platelets: 47 10*3/uL — ABNORMAL LOW (ref 150–400)
RBC: 4.77 MIL/uL (ref 4.22–5.81)
RDW: 14.2 % (ref 11.5–15.5)
WBC: 3.8 10*3/uL — ABNORMAL LOW (ref 4.0–10.5)

## 2016-11-30 LAB — COMPREHENSIVE METABOLIC PANEL
ALT: 23 U/L (ref 17–63)
AST: 27 U/L (ref 15–41)
Albumin: 3.5 g/dL (ref 3.5–5.0)
Alkaline Phosphatase: 68 U/L (ref 38–126)
Anion gap: 8 (ref 5–15)
BUN: 9 mg/dL (ref 6–20)
CO2: 23 mmol/L (ref 22–32)
Calcium: 9.3 mg/dL (ref 8.9–10.3)
Chloride: 105 mmol/L (ref 101–111)
Creatinine, Ser: 0.83 mg/dL (ref 0.61–1.24)
GFR calc Af Amer: >60 mL/min (ref >60)
GFR calc non Af Amer: >60 mL/min (ref >60)
Glucose, Bld: 189 mg/dL — ABNORMAL HIGH (ref 65–99)
Potassium: 3.9 mmol/L (ref 3.5–5.1)
Sodium: 136 mmol/L (ref 135–145)
Total Bilirubin: 1.5 mg/dL — ABNORMAL HIGH (ref 0.3–1.2)
Total Protein: 6.5 g/dL (ref 6.5–8.1)

## 2016-11-30 LAB — GLUCOSE, CAPILLARY
Glucose-Capillary: 183 mg/dL — ABNORMAL HIGH (ref 65–99)
Glucose-Capillary: 167 mg/dL — ABNORMAL HIGH (ref 65–99)
Glucose-Capillary: 158 mg/dL — ABNORMAL HIGH (ref 65–99)
Glucose-Capillary: 173 mg/dL — ABNORMAL HIGH (ref 65–99)
Glucose-Capillary: 278 mg/dL — ABNORMAL HIGH (ref 65–99)

## 2016-11-30 LAB — PROTIME-INR
INR: 1.23
Prothrombin Time: 15.4 seconds — ABNORMAL HIGH (ref 11.4–15.2)

## 2016-11-30 LAB — APTT: aPTT: 34 seconds (ref 24–36)

## 2016-11-30 LAB — TYPE AND SCREEN
ABO/RH(D): A POS
Antibody Screen: NEGATIVE

## 2016-11-30 LAB — HEMOGLOBIN A1C
Hgb A1c MFr Bld: 7.7 % — ABNORMAL HIGH (ref 4.8–5.6)
Mean Plasma Glucose: 174.29 mg/dL

## 2016-11-30 SURGERY — SHOULDER ARTHROSCOPY WITH SUBACROMIAL DECOMPRESSION
Anesthesia: Regional | Site: Shoulder | Laterality: Right

## 2016-11-30 MED ORDER — OXYCODONE HCL 5 MG/5ML PO SOLN: 5.0000 mg | Freq: Once | ORAL | Status: DC | PRN

## 2016-11-30 MED ORDER — FENTANYL CITRATE (PF) 100 MCG/2ML IJ SOLN
100.0000 ug | Freq: Once | INTRAMUSCULAR | Status: AC
Start: 2016-11-30 — End: 2016-11-30
  Administered 2016-11-30: 100 ug via INTRAVENOUS

## 2016-11-30 MED ORDER — TRAZODONE HCL 50 MG PO TABS
50.0000 mg | ORAL_TABLET | Freq: Every day | ORAL | Status: DC
  Administered 2016-11-30: 50 mg via ORAL
  Filled 2016-11-30: qty 2

## 2016-11-30 MED ORDER — FENTANYL CITRATE (PF) 100 MCG/2ML IJ SOLN
INTRAMUSCULAR | Status: DC | PRN
  Administered 2016-11-30: 100 ug via INTRAVENOUS

## 2016-11-30 MED ORDER — MIDAZOLAM HCL 2 MG/2ML IJ SOLN
2.0000 mg | Freq: Once | INTRAMUSCULAR | Status: AC
  Administered 2016-11-30: 2 mg via INTRAVENOUS

## 2016-11-30 MED ORDER — LACTATED RINGERS IV SOLN
INTRAVENOUS | Status: DC | PRN
  Administered 2016-11-30 (×2): via INTRAVENOUS

## 2016-11-30 MED ORDER — METHOCARBAMOL 1000 MG/10ML IJ SOLN
500.0000 mg | Freq: Four times a day (QID) | INTRAVENOUS | Status: DC | PRN
  Filled 2016-11-30: qty 5

## 2016-11-30 MED ORDER — ACETAMINOPHEN 325 MG PO TABS
650.0000 mg | ORAL_TABLET | Freq: Four times a day (QID) | ORAL | Status: DC | PRN
  Administered 2016-11-30: 650 mg via ORAL
  Filled 2016-11-30: qty 2

## 2016-11-30 MED ORDER — PROMETHAZINE HCL 25 MG/ML IJ SOLN: 6.2500 mg | INTRAMUSCULAR | Status: DC | PRN

## 2016-11-30 MED ORDER — IBUPROFEN 200 MG PO TABS: 400.0000 mg | ORAL_TABLET | Freq: Three times a day (TID) | ORAL | Status: DC | PRN

## 2016-11-30 MED ORDER — EPHEDRINE SULFATE-NACL 50-0.9 MG/10ML-% IV SOSY
PREFILLED_SYRINGE | INTRAVENOUS | Status: DC | PRN
  Administered 2016-11-30 (×2): 10 mg via INTRAVENOUS

## 2016-11-30 MED ORDER — HYDROMORPHONE HCL 1 MG/ML IJ SOLN: 0.2500 mg | INTRAMUSCULAR | Status: DC | PRN

## 2016-11-30 MED ORDER — LIDOCAINE 2% (20 MG/ML) 5 ML SYRINGE
INTRAMUSCULAR | Status: DC | PRN
  Administered 2016-11-30: 80 mg via INTRAVENOUS

## 2016-11-30 MED ORDER — SUGAMMADEX SODIUM 200 MG/2ML IV SOLN
INTRAVENOUS | Status: DC | PRN
  Administered 2016-11-30: 300 mg via INTRAVENOUS

## 2016-11-30 MED ORDER — MIDAZOLAM HCL 5 MG/5ML IJ SOLN
INTRAMUSCULAR | Status: DC | PRN
  Administered 2016-11-30: 2 mg via INTRAVENOUS

## 2016-11-30 MED ORDER — MIDAZOLAM HCL 2 MG/2ML IJ SOLN
INTRAMUSCULAR | Status: AC
  Filled 2016-11-30: qty 2

## 2016-11-30 MED ORDER — INSULIN ASPART 100 UNIT/ML ~~LOC~~ SOLN
0.0000 [IU] | Freq: Three times a day (TID) | SUBCUTANEOUS | Status: DC
  Administered 2016-11-30: 3 [IU] via SUBCUTANEOUS
  Administered 2016-12-01 (×2): 5 [IU] via SUBCUTANEOUS

## 2016-11-30 MED ORDER — ATENOLOL 50 MG PO TABS
25.0000 mg | ORAL_TABLET | Freq: Every day | ORAL | Status: DC
  Administered 2016-12-01: 25 mg via ORAL
  Filled 2016-11-30: qty 1

## 2016-11-30 MED ORDER — ACETAMINOPHEN 650 MG RE SUPP: 650.0000 mg | Freq: Four times a day (QID) | RECTAL | Status: DC | PRN

## 2016-11-30 MED ORDER — CHLORHEXIDINE GLUCONATE 4 % EX LIQD: 60.0000 mL | Freq: Once | CUTANEOUS | Status: DC

## 2016-11-30 MED ORDER — LACTATED RINGERS IV SOLN
INTRAVENOUS | Status: DC
  Administered 2016-11-30: 11:00:00 via INTRAVENOUS

## 2016-11-30 MED ORDER — LISINOPRIL 5 MG PO TABS
5.0000 mg | ORAL_TABLET | Freq: Every day | ORAL | Status: DC
  Administered 2016-12-01: 5 mg via ORAL
  Filled 2016-11-30: qty 1

## 2016-11-30 MED ORDER — ROPIVACAINE HCL 5 MG/ML IJ SOLN
INTRAMUSCULAR | Status: DC | PRN
  Administered 2016-11-30: 30 mL via PERINEURAL

## 2016-11-30 MED ORDER — PROPOFOL 10 MG/ML IV BOLUS
INTRAVENOUS | Status: DC | PRN
  Administered 2016-11-30: 180 mg via INTRAVENOUS

## 2016-11-30 MED ORDER — HYDROMORPHONE HCL 8 MG PO TABS
8.0000 mg | ORAL_TABLET | ORAL | 0 refills | Status: DC | PRN
Start: 2016-11-30 — End: 2022-04-27

## 2016-11-30 MED ORDER — ACETAMINOPHEN 325 MG PO TABS
ORAL_TABLET | ORAL | Status: AC
  Administered 2016-11-30: 650 mg via ORAL
  Filled 2016-11-30: qty 2

## 2016-11-30 MED ORDER — ONDANSETRON HCL 4 MG/2ML IJ SOLN
INTRAMUSCULAR | Status: AC
  Filled 2016-11-30: qty 2

## 2016-11-30 MED ORDER — OXYCODONE HCL 5 MG PO TABS: 5.0000 mg | ORAL_TABLET | Freq: Once | ORAL | Status: DC | PRN

## 2016-11-30 MED ORDER — MIDAZOLAM HCL 2 MG/2ML IJ SOLN
INTRAMUSCULAR | Status: AC
  Administered 2016-11-30: 2 mg via INTRAVENOUS
  Filled 2016-11-30: qty 2

## 2016-11-30 MED ORDER — DEXTROSE 5 % IV SOLN
3.0000 g | INTRAVENOUS | Status: AC
  Administered 2016-11-30: 3 g via INTRAVENOUS
  Filled 2016-11-30: qty 3000

## 2016-11-30 MED ORDER — EPHEDRINE 5 MG/ML INJ
INTRAVENOUS | Status: AC
  Filled 2016-11-30: qty 10

## 2016-11-30 MED ORDER — DEXAMETHASONE SODIUM PHOSPHATE 4 MG/ML IJ SOLN
INTRAMUSCULAR | Status: DC | PRN
  Administered 2016-11-30: 8 mg via INTRAVENOUS

## 2016-11-30 MED ORDER — SUCCINYLCHOLINE CHLORIDE 200 MG/10ML IV SOSY
PREFILLED_SYRINGE | INTRAVENOUS | Status: AC
  Filled 2016-11-30: qty 10

## 2016-11-30 MED ORDER — ROCURONIUM BROMIDE 100 MG/10ML IV SOLN
INTRAVENOUS | Status: DC | PRN
  Administered 2016-11-30: 20 mg via INTRAVENOUS
  Administered 2016-11-30: 10 mg via INTRAVENOUS
  Administered 2016-11-30: 40 mg via INTRAVENOUS

## 2016-11-30 MED ORDER — ONDANSETRON HCL 4 MG/2ML IJ SOLN
INTRAMUSCULAR | Status: DC | PRN
  Administered 2016-11-30: 4 mg via INTRAVENOUS

## 2016-11-30 MED ORDER — ALPRAZOLAM 0.5 MG PO TABS
2.0000 mg | ORAL_TABLET | Freq: Three times a day (TID) | ORAL | Status: DC | PRN
  Administered 2016-11-30 – 2016-12-01 (×2): 2 mg via ORAL
  Filled 2016-11-30 (×2): qty 4

## 2016-11-30 MED ORDER — FENTANYL CITRATE (PF) 250 MCG/5ML IJ SOLN
INTRAMUSCULAR | Status: AC
  Filled 2016-11-30: qty 5

## 2016-11-30 MED ORDER — DEXAMETHASONE SODIUM PHOSPHATE 10 MG/ML IJ SOLN
INTRAMUSCULAR | Status: AC
  Filled 2016-11-30: qty 1

## 2016-11-30 MED ORDER — BUPIVACAINE-EPINEPHRINE (PF) 0.25% -1:200000 IJ SOLN
INTRAMUSCULAR | Status: AC
  Filled 2016-11-30: qty 30

## 2016-11-30 MED ORDER — PHENYLEPHRINE 40 MCG/ML (10ML) SYRINGE FOR IV PUSH (FOR BLOOD PRESSURE SUPPORT)
PREFILLED_SYRINGE | INTRAVENOUS | Status: AC
Start: 2016-11-30 — End: 2016-11-30
  Filled 2016-11-30: qty 10

## 2016-11-30 MED ORDER — ONDANSETRON HCL 4 MG/2ML IJ SOLN: 4.0000 mg | Freq: Four times a day (QID) | INTRAMUSCULAR | Status: DC | PRN

## 2016-11-30 MED ORDER — METFORMIN HCL ER 500 MG PO TB24
1000.0000 mg | ORAL_TABLET | Freq: Two times a day (BID) | ORAL | Status: DC
  Administered 2016-12-01: 1000 mg via ORAL
  Filled 2016-11-30: qty 2

## 2016-11-30 MED ORDER — DEXTROSE 5 % IV SOLN
INTRAVENOUS | Status: DC | PRN
  Administered 2016-11-30: 30 ug/min via INTRAVENOUS

## 2016-11-30 MED ORDER — 0.9 % SODIUM CHLORIDE (POUR BTL) OPTIME
TOPICAL | Status: DC | PRN
  Administered 2016-11-30: 1000 mL

## 2016-11-30 MED ORDER — ROCURONIUM BROMIDE 10 MG/ML (PF) SYRINGE
PREFILLED_SYRINGE | INTRAVENOUS | Status: AC
  Filled 2016-11-30: qty 5

## 2016-11-30 MED ORDER — ONDANSETRON HCL 4 MG PO TABS: 4.0000 mg | ORAL_TABLET | Freq: Four times a day (QID) | ORAL | Status: DC | PRN

## 2016-11-30 MED ORDER — HYDROMORPHONE HCL 2 MG PO TABS
8.0000 mg | ORAL_TABLET | ORAL | Status: DC | PRN
  Administered 2016-12-01 (×2): 8 mg via ORAL
  Filled 2016-11-30 (×2): qty 4

## 2016-11-30 MED ORDER — MEPERIDINE HCL 25 MG/ML IJ SOLN: 6.2500 mg | INTRAMUSCULAR | Status: DC | PRN

## 2016-11-30 MED ORDER — ATORVASTATIN CALCIUM 40 MG PO TABS
40.0000 mg | ORAL_TABLET | Freq: Every day | ORAL | Status: DC
  Administered 2016-12-01: 40 mg via ORAL
  Filled 2016-11-30: qty 1

## 2016-11-30 MED ORDER — SUGAMMADEX SODIUM 200 MG/2ML IV SOLN
INTRAVENOUS | Status: AC
  Filled 2016-11-30: qty 2

## 2016-11-30 MED ORDER — METHOCARBAMOL 500 MG PO TABS
500.0000 mg | ORAL_TABLET | Freq: Four times a day (QID) | ORAL | Status: DC | PRN
  Administered 2016-12-01: 500 mg via ORAL
  Filled 2016-11-30: qty 1

## 2016-11-30 MED ORDER — PANTOPRAZOLE SODIUM 40 MG PO TBEC
40.0000 mg | DELAYED_RELEASE_TABLET | Freq: Every day | ORAL | Status: DC
  Administered 2016-12-01: 40 mg via ORAL
  Filled 2016-11-30: qty 1

## 2016-11-30 MED ORDER — HYDROMORPHONE HCL 1 MG/ML IJ SOLN
1.0000 mg | INTRAMUSCULAR | Status: DC | PRN
  Administered 2016-11-30 – 2016-12-01 (×4): 1 mg via INTRAVENOUS
  Filled 2016-11-30 (×4): qty 1

## 2016-11-30 MED ORDER — LIDOCAINE 2% (20 MG/ML) 5 ML SYRINGE
INTRAMUSCULAR | Status: AC
  Filled 2016-11-30: qty 5

## 2016-11-30 MED ORDER — PROPOFOL 10 MG/ML IV BOLUS
INTRAVENOUS | Status: AC
  Filled 2016-11-30: qty 20

## 2016-11-30 MED ORDER — INSULIN ASPART 100 UNIT/ML ~~LOC~~ SOLN
0.0000 [IU] | Freq: Every day | SUBCUTANEOUS | Status: DC
  Administered 2016-11-30: 3 [IU] via SUBCUTANEOUS

## 2016-11-30 MED ORDER — SODIUM CHLORIDE 0.9 % IR SOLN
Status: DC | PRN
  Administered 2016-11-30 (×2): 3000 mL

## 2016-11-30 MED ORDER — ARTIFICIAL TEARS OPHTHALMIC OINT
TOPICAL_OINTMENT | OPHTHALMIC | Status: DC | PRN
  Administered 2016-11-30: 1 via OPHTHALMIC

## 2016-11-30 MED ORDER — FENTANYL CITRATE (PF) 100 MCG/2ML IJ SOLN
INTRAMUSCULAR | Status: AC
  Administered 2016-11-30: 100 ug via INTRAVENOUS
  Filled 2016-11-30: qty 2

## 2016-11-30 MED ORDER — PHENYLEPHRINE 40 MCG/ML (10ML) SYRINGE FOR IV PUSH (FOR BLOOD PRESSURE SUPPORT)
PREFILLED_SYRINGE | INTRAVENOUS | Status: DC | PRN
  Administered 2016-11-30: 80 ug via INTRAVENOUS
  Administered 2016-11-30: 40 ug via INTRAVENOUS

## 2016-11-30 MED ORDER — SUCCINYLCHOLINE CHLORIDE 20 MG/ML IJ SOLN
INTRAMUSCULAR | Status: DC | PRN
  Administered 2016-11-30: 140 mg via INTRAVENOUS

## 2016-11-30 SURGICAL SUPPLY — 38 items
BURR OVAL 8 FLU 4.0X13 (MISCELLANEOUS) ×2 IMPLANT
CHLORAPREP W/TINT 26ML (MISCELLANEOUS) ×2 IMPLANT
COVER SURGICAL LIGHT HANDLE (MISCELLANEOUS) ×2 IMPLANT
CUTTER BONE 4.0MM X 13CM (MISCELLANEOUS) ×2 IMPLANT
DRAPE IMP U-DRAPE 54X76 (DRAPES) ×4 IMPLANT
DRAPE ORTHO SPLIT 77X108 STRL (DRAPES) ×2
DRAPE STERI 35X30 U-POUCH (DRAPES) ×2 IMPLANT
DRAPE SURG ORHT 6 SPLT 77X108 (DRAPES) ×2 IMPLANT
ELECT REM PT RETURN 9FT ADLT (ELECTROSURGICAL) ×2
ELECTRODE REM PT RTRN 9FT ADLT (ELECTROSURGICAL) ×1 IMPLANT
GLOVE BIO SURGEON STRL SZ7.5 (GLOVE) ×2 IMPLANT
GLOVE BIOGEL PI IND STRL 8 (GLOVE) ×1 IMPLANT
GLOVE BIOGEL PI INDICATOR 8 (GLOVE) ×1
GOWN STRL REUS W/ TWL LRG LVL3 (GOWN DISPOSABLE) ×1 IMPLANT
GOWN STRL REUS W/ TWL XL LVL3 (GOWN DISPOSABLE) ×2 IMPLANT
GOWN STRL REUS W/TWL LRG LVL3 (GOWN DISPOSABLE) ×1
GOWN STRL REUS W/TWL XL LVL3 (GOWN DISPOSABLE) ×2
KIT BASIN OR (CUSTOM PROCEDURE TRAY) ×2 IMPLANT
KIT ROOM TURNOVER OR (KITS) ×2 IMPLANT
MANIFOLD NEPTUNE II (INSTRUMENTS) ×2 IMPLANT
NS IRRIG 1000ML POUR BTL (IV SOLUTION) ×2 IMPLANT
PACK SHOULDER (CUSTOM PROCEDURE TRAY) ×2 IMPLANT
PAD ABD 8X10 STRL (GAUZE/BANDAGES/DRESSINGS) ×2 IMPLANT
PAD ARMBOARD 7.5X6 YLW CONV (MISCELLANEOUS) ×4 IMPLANT
PROBE BIPOLAR ATHRO 135MM 90D (MISCELLANEOUS) ×2 IMPLANT
SLEEVE ARM SUSPENSION SYSTEM (MISCELLANEOUS) ×2 IMPLANT
SLING ARM FOAM STRAP LRG (SOFTGOODS) ×2 IMPLANT
SLING ARM IMMOBILIZER XL (CAST SUPPLIES) ×2 IMPLANT
SLING S3 LATERAL DISP (MISCELLANEOUS) ×2 IMPLANT
SPONGE LAP 4X18 X RAY DECT (DISPOSABLE) ×2 IMPLANT
STRIP CLOSURE SKIN 1/2X4 (GAUZE/BANDAGES/DRESSINGS) ×2 IMPLANT
SUT MNCRL AB 3-0 PS2 18 (SUTURE) ×2 IMPLANT
TAPE CLOTH SURG 6X10 WHT LF (GAUZE/BANDAGES/DRESSINGS) ×2 IMPLANT
TOWEL OR 17X24 6PK STRL BLUE (TOWEL DISPOSABLE) ×2 IMPLANT
TOWEL OR 17X26 10 PK STRL BLUE (TOWEL DISPOSABLE) ×2 IMPLANT
TUBE CONNECTING 12X1/4 (SUCTIONS) ×2 IMPLANT
TUBING ARTHROSCOPY IRRIG 16FT (MISCELLANEOUS) ×2 IMPLANT
WATER STERILE IRR 1000ML POUR (IV SOLUTION) ×2 IMPLANT

## 2016-11-30 NOTE — Anesthesia Preprocedure Evaluation (Signed)
Anesthesia Evaluation  Patient identified by MRN, date of birth, ID band Patient awake    Reviewed: Allergy & Precautions, NPO status , Patient's Chart, lab work & pertinent test results, reviewed documented beta blocker date and time   History of Anesthesia Complications Negative for: history of anesthetic complications  Airway Mallampati: II  TM Distance: >3 FB Neck ROM: Full    Dental  (+) Poor Dentition, Missing, Chipped, Dental Advisory Given   Pulmonary sleep apnea and Continuous Positive Airway Pressure Ventilation , former smoker,    breath sounds clear to auscultation       Cardiovascular hypertension, Pt. on medications and Pt. on home beta blockers (-) angina Rhythm:Regular Rate:Normal     Neuro/Psych Anxiety Depression Chronic back pain    GI/Hepatic GERD  Medicated and Controlled,(+) Hepatitis -, B  Endo/Other  diabetes, Type 2, Insulin Dependent, Oral Hypoglycemic AgentsMorbid obesity  Renal/GU negative Renal ROS     Musculoskeletal  (+) Arthritis , Osteoarthritis,    Abdominal (+) + obese,   Peds  Hematology  (+) Blood dyscrasia (thrombocytopenia: plt 49k), ,   Anesthesia Other Findings   Reproductive/Obstetrics                             Anesthesia Physical  Anesthesia Plan  ASA: III  Anesthesia Plan: General   Post-op Pain Management: GA combined w/ Regional for post-op pain   Induction: Intravenous  PONV Risk Score and Plan: 2 and Ondansetron and Midazolam  Airway Management Planned: Oral ETT  Additional Equipment:   Intra-op Plan:   Post-operative Plan: Extubation in OR  Informed Consent: I have reviewed the patients History and Physical, chart, labs and discussed the procedure including the risks, benefits and alternatives for the proposed anesthesia with the patient or authorized representative who has indicated his/her understanding and acceptance.    Dental advisory given  Plan Discussed with: CRNA and Surgeon  Anesthesia Plan Comments: (Plan routine monitors, GETA)        Anesthesia Quick Evaluation

## 2016-11-30 NOTE — Brief Op Note (Signed)
11/30/2016  2:33 PM  PATIENT:  [name]  56 y.o. male  PRE-OPERATIVE DIAGNOSIS:  Right shoulder impingement, biceps tendonitis  POST-OPERATIVE DIAGNOSIS:  Right shoulder impingement, biceps tendonitis  PROCEDURE:  Procedure(s) with comments: Right shoulder arthroscopy, extensive debridement and subacromial decompression (Right) - 80  SURGEON:  Surgeon(s) and Role:    Stann Mainland, Elly Modena, MD - Primary  PHYSICIAN ASSISTANT:   ASSISTANTS: none   ANESTHESIA:   regional and general  EBL: 30 cc  BLOOD ADMINISTERED:none  DRAINS: none   LOCAL MEDICATIONS USED:  NONE  SPECIMEN:  No Specimen  DISPOSITION OF SPECIMEN:  N/A  COUNTS:  YES  TOURNIQUET:  * No tourniquets in log *  DICTATION: .Note written in EPIC  PLAN OF CARE: Admit for overnight observation  PATIENT DISPOSITION:  PACU - hemodynamically stable.   Delay start of Pharmacological VTE agent (>24hrs) due to surgical blood loss or risk of bleeding: not applicable

## 2016-11-30 NOTE — Consult Note (Addendum)
ORTHOPAEDIC H and P  REQUESTING PHYSICIAN: Nicholes Stairs, MD  PCP:  Antony Contras, MD  Chief Complaint: Right shoulder pain  HPI: Victor [name] is a 56 y.o. male who complains of chronic recalcitrant right shoulder pain.  He has had multiple steroid injections as well as therapy for the right shoulder. Continues to have recalcitrant pain worse with abduction and overhead activities. Since causing him some difficulties with activities of daily living. He is also under the care of my partners for his cervical and lumbar spine issues. He does have a spinal cord stimulator. We discussed to my office moving forward with right shoulder arthroscopy withsubacromial decompression, extensive debridement, and distal clavicle resection based off a findings from a CT scan.  He has provided informed consent prior to arrival today.  Past Medical History:  Diagnosis Date  . Anxiety    takes Xanax daily as needed  . Blood dyscrasia    thrombocytopenia   . Blood transfusion    platelets  . C. difficile colitis 03/2015  . Depression    Emsam patch daily  . Diabetes mellitus     Type II   . GERD (gastroesophageal reflux disease)    takes Protonix daily  . Hepatitis    hepatitis b/ newly diagnosed with portal hypertension  . Hepatitis B virus infection 03/2007  . History of kidney stones    passed- x 2  . History of shingles   . Hyperlipidemia    takes AtorvaStatin daily  . Hypertension    takes Atenolol and Lisinopril daily  . Obstructive sleep apnea (adult) (pediatric)    mild with AHI 11.61/hr now on CPAP at 15cm h2o, CPAP is used q night   . Osteoarthritis   . Pancreatitis   . Sciatica    lumbar region - 2010, also has had numerous injections   . Splenomegaly    LOV Dr Lamonte Sakai  12/12 on chart  . Thrombocytopenia ([place])    Past Surgical History:  Procedure Laterality Date  . CHOLECYSTECTOMY N/A 04/26/2015   Procedure: LAPAROSCOPIC CHOLECYSTECTOMY;  Surgeon: Ralene Ok, MD;   Location: [place];  Service: General;  Laterality: N/A;  . COLONOSCOPY W/ POLYPECTOMY    . HAND SURGERY Right    right hand x 2- injured  . KNEE SURGERY     5 surgeries to right knee, 3 surgeries to left knee  . KYPHOPLASTY    . LUMBAR LAMINECTOMY/DECOMPRESSION MICRODISCECTOMY  02/19/2011   Procedure: LUMBAR LAMINECTOMY/DECOMPRESSION MICRODISCECTOMY;  Surgeon: Johnn Hai;  Location: [facility];  Service: Orthopedics;  Laterality: Left;  decompression l5-s1 l4-5 on left  . SHOULDER SURGERY Left    left shoulder, Rotar Cuff  . SPINAL CORD STIMULATOR BATTERY EXCHANGE N/A 03/14/2016   Procedure: Revision of spinal cord stimulator battery;  Surgeon: Melina Schools, MD;  Location: [place];  Service: Orthopedics;  Laterality: N/A;  Requests 1 hour  . SPINAL CORD STIMULATOR INSERTION N/A 02/02/2015   Procedure: LUMBAR SPINAL CORD STIMULATOR INSERTION;  Surgeon: Melina Schools, MD;  Location: [place];  Service: Orthopedics;  Laterality: N/A;   Social History   Social History  . Marital status: Married    Spouse name: N/A  . Number of children: N/A  . Years of education: N/A   Social History Main Topics  . Smoking status: Former Smoker    Packs/day: 1.00    Years: 15.00    Quit date: 12/31/1985  . Smokeless tobacco: Never Used  . Alcohol use No  .  Drug use: No  . Sexual activity: Not Asked   Other Topics Concern  . None   Social History Narrative  . None   Family History  Problem Relation Age of Onset  . Cancer Mother    Allergies  Allergen Reactions  . Canagliflozin Itching and Other (See Comments)    Yeast infections  pancreatitis  . Cyclobenzaprine Anaphylaxis and Other (See Comments)    REACTION:  Not compatible with Emsam patch---not taking this patch any longer   . Gabapentin Other (See Comments)    Other reaction(s): shortness of breath/tardive dyskinesia  . Tanzeum [Albiglutide] Other (See Comments)    PANCREATITIS  . Vicodin [Hydrocodone-Acetaminophen] Hives and Other  (See Comments)    Other reaction(s): hives/itching (although he is able to tolerate Percocet) REACTION: pt turns red and has hot flashes   . Nsaids Diarrhea and Other (See Comments)    Other reaction(s): stomach upset Stomach Upset    Prior to Admission medications   Medication Sig Start Date End Date Taking? Authorizing Provider  alprazolam Duanne Moron) 2 MG tablet Take 2 mg by mouth every 8 (eight) hours as needed (for anxiety/sleep).  01/13/16  Yes [provider]  atenolol (TENORMIN) 25 MG tablet Take 25 mg by mouth daily.    Yes [provider]  atorvastatin (LIPITOR) 40 MG tablet Take 40 mg by mouth daily before breakfast.    Yes [provider]  HYDROmorphone (DILAUDID) 8 MG tablet Take 8 mg by mouth every 4 (four) hours as needed for moderate pain or severe pain.    Yes [provider]  ibuprofen (ADVIL,MOTRIN) 200 MG tablet Take 400 mg by mouth every 8 (eight) hours as needed (for pain.).   Yes [provider]  insulin NPH Human (HUMULIN N,NOVOLIN N) 100 UNIT/ML injection Inject 40 Units into the skin 2 (two) times daily.   Yes [provider]  lisinopril (PRINIVIL,ZESTRIL) 5 MG tablet Take 5 mg by mouth daily. 02/04/16  Yes [provider]  metFORMIN (GLUCOPHAGE-XR) 500 MG 24 hr tablet Take 1,000 mg by mouth 2 (two) times daily. 11/08/16  Yes [provider]  Omega-3 Fatty Acids (FISH OIL) 1000 MG CAPS Take 1,000 mg by mouth daily.   Yes [provider]  pantoprazole (PROTONIX) 40 MG tablet Take 40 mg by mouth daily before breakfast.  11/30/15  Yes [provider]  traZODone (DESYREL) 50 MG tablet Take 50-100 mg by mouth at bedtime. 11/14/16  Yes [provider]  vitamin E 400 UNIT capsule Take 400 Units by mouth daily.   Yes [provider]   No results found.  Positive ROS: All other systems have been reviewed and were otherwise negative with the exception of those mentioned in  the HPI and as above.  Physical Exam: General: Alert, no acute distress Cardiovascular: No pedal edema Respiratory: No cyanosis, no use of accessory musculature GI: No organomegaly, abdomen is soft and non-tender Skin: No lesions in the area of chief complaint Neurologic: Sensation intact distally Psychiatric: Patient is competent for consent with normal mood and affect Lymphatic: No axillary or cervical lymphadenopathy  MUSCULOSKELETAL:  Right shoulder exam: no open wounds or overlying skin changes. He does have tenderness at the before meals joint and a positive cross body adduction and exam. Otherwise good preservation of rotator cuff strength. Positive impingement signs. Neurovascularly intact throughout.  Assessment: 1. Chronic right shoulder pain. 2. Subacromial impingement right shoulder. 3. 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  Plan: - we  discussed moving forward today with right shoulder arthroscopy including extensive debridement,subacromial decompression, and distal clavicle resection.  We reviewed the risk, benefits, and indications of this procedure at length. All questions were solicited and answered to his satisfaction.  - risks include but are not limited to bleeding, infection, damage to surrounding neurovascular structures, continued pain, need for future surgery, development of blood clots, and the risk of anesthesia. - Due to his chronic thrombocytopenia we will keep a close eye on that intraoperatively and plan on admitting him postoperatively for close monitoring as well as pain control. - Plan for discharge tomorrow morning when stable.    Nicholes Stairs, MD Cell [phone]    11/30/2016 12:18 PM

## 2016-11-30 NOTE — Anesthesia Procedure Notes (Addendum)
Procedure Name: Intubation Date/Time: 11/30/2016 1:14 PM Performed by: Orlie Dakin Pre-anesthesia Checklist: Patient identified, Emergency Drugs available, Suction available, Patient being monitored and Timeout performed Patient Re-evaluated:Patient Re-evaluated prior to induction Oxygen Delivery Method: Circle system utilized Preoxygenation: Pre-oxygenation with 100% oxygen Induction Type: IV induction Ventilation: Two handed mask ventilation required Laryngoscope Size: Miller and 3 Grade View: Grade I Tube type: Oral Number of attempts: 1 Airway Equipment and Method: Stylet Placement Confirmation: ETT inserted through vocal cords under direct vision,  breath sounds checked- equal and bilateral and positive ETCO2 Secured at: 24 cm Tube secured with: Tape Dental Injury: Teeth and Oropharynx as per pre-operative assessment  Comments: Very poor dentition noted pre-op, many missing, broken off, none loose per patient report.  4x4s bite block used.

## 2016-11-30 NOTE — Anesthesia Procedure Notes (Signed)
Anesthesia Regional Block: Interscalene brachial plexus block   Pre-Anesthetic Checklist: ,, timeout performed, Correct Patient, Correct Site, Correct Laterality, Correct Procedure, Correct Position, site marked, Risks and benefits discussed,  Surgical consent,  Pre-op evaluation,  At surgeon's request and post-op pain management  Laterality: Right  Prep: chloraprep       Needles:  Injection technique: Single-shot  Needle Type: Stimiplex     Needle Length: 9cm  Needle Gauge: 21     Additional Needles:   Procedures:,,,, ultrasound used (permanent image in chart),,,,  Narrative:  Start time: 11/30/2016 11:41 AM End time: 11/30/2016 11:46 AM Injection made incrementally with aspirations every 5 mL.  Performed by: Personally  Anesthesiologist: Candida Peeling RAY

## 2016-11-30 NOTE — Transfer of Care (Signed)
Immediate Anesthesia Transfer of Care Note  Patient: Victor [name]  Procedure(s) Performed: Right shoulder arthroscopy, extensive debridement and subacromial decompression (Right Shoulder)  Patient Location: PACU  Anesthesia Type:General and Regional  Level of Consciousness: awake, oriented and patient cooperative  Airway & Oxygen Therapy: Patient Spontanous Breathing and Patient connected to face mask oxygen  Post-op Assessment: Report given to RN and Post -op Vital signs reviewed and stable  Post vital signs: Reviewed and stable  Last Vitals:  Vitals:   11/30/16 1145 11/30/16 1441  BP:    Pulse: 69   Resp: 13   Temp:  (!) (P) 36.4 C  SpO2: 98%     Last Pain:  Vitals:   11/30/16 1441  TempSrc:   PainSc: (P) 0-No pain         Complications: No apparent anesthesia complications

## 2016-11-30 NOTE — Op Note (Signed)
Date of procedure: 11/30/2016.  INDICATIONS: Victor [name] is a 56 y.o.-year-old male with a right Shoulder partial rotator cuff tear, subacromial impingement, AC joint arthritis and biceps tendinitis.  He has failed extensive conservative management including multiple injections in multiple locations.  He has had recalcitrant pain to those measures as well as oral medications and therapy.  He is indicated for operative intervention given his failure of treatment effect from the above measures.  We did discuss the risk benefits and indications of the procedure including but not limited to Bleeding, infection, Damage to neurovascular structures, persistent pain, development of arthritis, need for future surgery, risk of anesthesia, and the Development of potential blood clots.;  The patient did consent to the procedure after discussion of the risks and benefits.  PREOPERATIVE DIAGNOSIS: 1. Right shoulder biceps tendinitis 2.  Right shoulder subacromial impingement 3.  Right shoulder partial articular sided rotator cuff tear. 4.  Right acromioclavicular joint arthritis.  POSTOPERATIVE DIAGNOSIS: Same.  PROCEDURE:1. Right shoulder arthroscopy with extensive debridement 2. Distal clavicle resection section measured 1.0 cm. 2.Subacromial decompression with partial resection of the coracoacromial ligament  SURGEON: Geralynn Rile, M.D.  ASSIST: None.  ANESTHESIA:  general, And regional  IV FLUIDS AND URINE: See anesthesia.  ESTIMATED BLOOD LOSS: 10 mL.  IMPLANTS: None  DRAINS: None  COMPLICATIONS: None.  DESCRIPTION OF PROCEDURE: The patient was brought to the operating room and placed Supine on the operating table.  The patient had been signed prior to the procedure and this was documented. The patient had the anesthesia placed by the anesthesiologist.  A time-out was performed to confirm that this was the correct patient, site, side and location. The patient did receive  antibiotics prior to the incision and was re-dosed during the procedure as needed at indicated intervals.   He was then placed in the Left lateral decubitus position with the Right arm up. The patient had the operative extremity prepped and draped in the standard surgical fashion.      After obtaining informed consent the patient was brought to the operating table and underwent satisfactory anesthesia. An exam under anesthesia revealed full range of motion. He was placed in the lateral decubitus position with an axillary roll and all bony prominences properly padded. A standard surgical timeout was performed. He was placed in gentle in-line suspension.  Standard posterior and anterior superior portals were established. A diagnostic evaluation of the glenohumeral joint was performed. The biceps tendon was markedly synovitic and partially torn. It was tenotomized. An extensive debridement was performed of the superior anterior and posterior labrum, As well as undersurface of the supraspinatus rotator cuff partial tear, and extensively in the rotator interval. The articular surfaces revealed no significant chondromalacia . The subscapularis was intact, There was some upper border subscap partial tearing. The supraspinatus had a partial articular sided tear, This tear was less than 30%.  The infraspinatus, and teres minor were intact with no partial tearing.  There were no loose bodies noted in the glenohumeral joint.  The arthroscope was inserted in the subacromial space and an additional lateral portal was established. An acromioplasty performed nicely decompressing the subacromial space with a motorized burr. Bursitis in the subacromial space was removed.    We next turned attention to the distal clavicle.  We performed a distal clavicle resection utilizing the motorized shaver and motor bur.  We resected approximately once in a meter of distal clavicle as well as 2-3 mm of the medial acromion.  Care was  taken to preserve the superior and posterior capsular ligaments to ensure AC joint stability.    The arthroscope was then removed and portals closed with 4-0 Monocryl in standard fashion followed by a sterile occlusive dressing Polar Care ice sleeve and a sling. The patient was sent to recovery in stable condition and tolerated the procedure well There were no immediate complications, and all counts were correct 2.  POSTOPERATIVE PLAN: Mr. Verde will be admitted for postoperative extended recovery due to his complex medical comorbid profile.  He may be weightbearing as tolerated to the Right arm and utilize the sling on an as-needed basis.  We will discharge him home in the morning with 2 week follow-up in the office for wound check.  We will begin physical therapy this week.

## 2016-11-30 NOTE — Anesthesia Postprocedure Evaluation (Signed)
Anesthesia Post Note  Patient: INDALECIO MALMSTROM  Procedure(s) Performed: Right shoulder arthroscopy, extensive debridement and subacromial decompression (Right Shoulder)     Patient location during evaluation: PACU Anesthesia Type: Regional and General Level of consciousness: awake Pain management: pain level controlled Vital Signs Assessment: post-procedure vital signs reviewed and stable Respiratory status: spontaneous breathing Cardiovascular status: stable Anesthetic complications: no    Last Vitals:  Vitals:   11/30/16 1541 11/30/16 1601  BP: 125/73 134/67  Pulse: 66 65  Resp: 20 18  Temp:  (!) 36.4 C  SpO2: 94% 96%    Last Pain:  Vitals:   11/30/16 1601  TempSrc: Oral  PainSc:                  Marion Rosenberry

## 2016-11-30 NOTE — H&P (Signed)
ORTHOPAEDIC H and P  REQUESTING PHYSICIAN: Nicholes Stairs, MD  PCP:  Antony Contras, MD  Chief Complaint: Right shoulder pain  HPI: Victor [name] is a 56 y.o. male who complains of chronic recalcitrant right shoulder pain.  He has had multiple steroid injections as well as therapy for the right shoulder. Continues to have recalcitrant pain worse with abduction and overhead activities. Since causing him some difficulties with activities of daily living. He is also under the care of my partners for his cervical and lumbar spine issues. He does have a spinal cord stimulator. We discussed to my office moving forward with right shoulder arthroscopy withsubacromial decompression, extensive debridement, and distal clavicle resection based off a findings from a CT scan.  He has provided informed consent prior to arrival today.  Past Medical History:  Diagnosis Date  . Anxiety    takes Xanax daily as needed  . Blood dyscrasia    thrombocytopenia   . Blood transfusion    platelets  . C. difficile colitis 03/2015  . Depression    Emsam patch daily  . Diabetes mellitus     Type II   . GERD (gastroesophageal reflux disease)    takes Protonix daily  . Hepatitis    hepatitis b/ newly diagnosed with portal hypertension  . Hepatitis B virus infection 03/2007  . History of kidney stones    passed- x 2  . History of shingles   . Hyperlipidemia    takes AtorvaStatin daily  . Hypertension    takes Atenolol and Lisinopril daily  . Obstructive sleep apnea (adult) (pediatric)    mild with AHI 11.61/hr now on CPAP at 15cm h2o, CPAP is used q night   . Osteoarthritis   . Pancreatitis   . Sciatica    lumbar region - 2010, also has had numerous injections   . Splenomegaly    LOV Dr Lamonte Sakai  12/12 on chart  . Thrombocytopenia ([place])    Past Surgical History:  Procedure Laterality Date  . CHOLECYSTECTOMY N/A 04/26/2015   Procedure: LAPAROSCOPIC CHOLECYSTECTOMY;  Surgeon: Ralene Ok, MD;   Location: [place];  Service: General;  Laterality: N/A;  . COLONOSCOPY W/ POLYPECTOMY    . HAND SURGERY Right    right hand x 2- injured  . KNEE SURGERY     5 surgeries to right knee, 3 surgeries to left knee  . KYPHOPLASTY    . LUMBAR LAMINECTOMY/DECOMPRESSION MICRODISCECTOMY  02/19/2011   Procedure: LUMBAR LAMINECTOMY/DECOMPRESSION MICRODISCECTOMY;  Surgeon: Johnn Hai;  Location: [facility];  Service: Orthopedics;  Laterality: Left;  decompression l5-s1 l4-5 on left  . SHOULDER SURGERY Left    left shoulder, Rotar Cuff  . SPINAL CORD STIMULATOR BATTERY EXCHANGE N/A 03/14/2016   Procedure: Revision of spinal cord stimulator battery;  Surgeon: Melina Schools, MD;  Location: [place];  Service: Orthopedics;  Laterality: N/A;  Requests 1 hour  . SPINAL CORD STIMULATOR INSERTION N/A 02/02/2015   Procedure: LUMBAR SPINAL CORD STIMULATOR INSERTION;  Surgeon: Melina Schools, MD;  Location: [place];  Service: Orthopedics;  Laterality: N/A;   Social History   Social History  . Marital status: Married    Spouse name: N/A  . Number of children: N/A  . Years of education: N/A   Social History Main Topics  . Smoking status: Former Smoker    Packs/day: 1.00    Years: 15.00    Quit date: 12/31/1985  . Smokeless tobacco: Never Used  . Alcohol use No  .  Drug use: No  . Sexual activity: Not Asked   Other Topics Concern  . None   Social History Narrative  . None   Family History  Problem Relation Age of Onset  . Cancer Mother    Allergies  Allergen Reactions  . Canagliflozin Itching and Other (See Comments)    Yeast infections  pancreatitis  . Cyclobenzaprine Anaphylaxis and Other (See Comments)    REACTION:  Not compatible with Emsam patch---not taking this patch any longer   . Gabapentin Other (See Comments)    Other reaction(s): shortness of breath/tardive dyskinesia  . Tanzeum [Albiglutide] Other (See Comments)    PANCREATITIS  . Vicodin [Hydrocodone-Acetaminophen] Hives and Other  (See Comments)    Other reaction(s): hives/itching (although he is able to tolerate Percocet) REACTION: pt turns red and has hot flashes   . Nsaids Diarrhea and Other (See Comments)    Other reaction(s): stomach upset Stomach Upset    Prior to Admission medications   Medication Sig Start Date End Date Taking? Authorizing Provider  alprazolam Duanne Moron) 2 MG tablet Take 2 mg by mouth every 8 (eight) hours as needed (for anxiety/sleep).  01/13/16  Yes [provider]  atenolol (TENORMIN) 25 MG tablet Take 25 mg by mouth daily.    Yes [provider]  atorvastatin (LIPITOR) 40 MG tablet Take 40 mg by mouth daily before breakfast.    Yes [provider]  HYDROmorphone (DILAUDID) 8 MG tablet Take 8 mg by mouth every 4 (four) hours as needed for moderate pain or severe pain.    Yes [provider]  ibuprofen (ADVIL,MOTRIN) 200 MG tablet Take 400 mg by mouth every 8 (eight) hours as needed (for pain.).   Yes [provider]  insulin NPH Human (HUMULIN N,NOVOLIN N) 100 UNIT/ML injection Inject 40 Units into the skin 2 (two) times daily.   Yes [provider]  lisinopril (PRINIVIL,ZESTRIL) 5 MG tablet Take 5 mg by mouth daily. 02/04/16  Yes [provider]  metFORMIN (GLUCOPHAGE-XR) 500 MG 24 hr tablet Take 1,000 mg by mouth 2 (two) times daily. 11/08/16  Yes [provider]  Omega-3 Fatty Acids (FISH OIL) 1000 MG CAPS Take 1,000 mg by mouth daily.   Yes [provider]  pantoprazole (PROTONIX) 40 MG tablet Take 40 mg by mouth daily before breakfast.  11/30/15  Yes [provider]  traZODone (DESYREL) 50 MG tablet Take 50-100 mg by mouth at bedtime. 11/14/16  Yes [provider]  vitamin E 400 UNIT capsule Take 400 Units by mouth daily.   Yes [provider]   No results found.  Positive ROS: All other systems have been reviewed and were otherwise negative with the exception of those mentioned in  the HPI and as above.  Physical Exam: General: Alert, no acute distress Cardiovascular: No pedal edema Respiratory: No cyanosis, no use of accessory musculature GI: No organomegaly, abdomen is soft and non-tender Skin: No lesions in the area of chief complaint Neurologic: Sensation intact distally Psychiatric: Patient is competent for consent with normal mood and affect Lymphatic: No axillary or cervical lymphadenopathy  MUSCULOSKELETAL:  Right shoulder exam: no open wounds or overlying skin changes. He does have tenderness at the before meals joint and a positive cross body adduction and exam. Otherwise good preservation of rotator cuff strength. Positive impingement signs. Neurovascularly intact throughout.  Assessment: 1. Chronic right shoulder pain. 2. Subacromial impingement right shoulder. 3. 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  Plan: - we  discussed moving forward today with right shoulder arthroscopy including extensive debridement,subacromial decompression, and distal clavicle resection.  We reviewed the risk, benefits, and indications of this procedure at length. All questions were solicited and answered to his satisfaction.  - risks include but are not limited to bleeding, infection, damage to surrounding neurovascular structures, continued pain, need for future surgery, development of blood clots, and the risk of anesthesia. - Due to his chronic thrombocytopenia we will keep a close eye on that intraoperatively and plan on admitting him postoperatively for close monitoring as well as pain control. - Plan for discharge tomorrow morning when stable.    Nicholes Stairs, MD Cell [phone]    11/30/2016 12:18 PM

## 2016-12-01 ENCOUNTER — Encounter (HOSPITAL_COMMUNITY): Payer: Self-pay | Admitting: Orthopedic Surgery

## 2016-12-01 DIAGNOSIS — I10 Essential (primary) hypertension: Secondary | ICD-10-CM | POA: Diagnosis not present

## 2016-12-01 DIAGNOSIS — Z794 Long term (current) use of insulin: Secondary | ICD-10-CM | POA: Diagnosis not present

## 2016-12-01 DIAGNOSIS — E119 Type 2 diabetes mellitus without complications: Secondary | ICD-10-CM | POA: Diagnosis not present

## 2016-12-01 DIAGNOSIS — M19011 Primary osteoarthritis, right shoulder: Secondary | ICD-10-CM | POA: Diagnosis not present

## 2016-12-01 DIAGNOSIS — M75101 Unspecified rotator cuff tear or rupture of right shoulder, not specified as traumatic: Secondary | ICD-10-CM | POA: Diagnosis not present

## 2016-12-01 DIAGNOSIS — M7541 Impingement syndrome of right shoulder: Secondary | ICD-10-CM | POA: Diagnosis not present

## 2016-12-01 LAB — CBC
HCT: 37.6 % — ABNORMAL LOW (ref 39.0–52.0)
Hemoglobin: 12.7 g/dL — ABNORMAL LOW (ref 13.0–17.0)
MCH: 28.9 pg (ref 26.0–34.0)
MCHC: 33.8 g/dL (ref 30.0–36.0)
MCV: 85.5 fL (ref 78.0–100.0)
Platelets: 57 10*3/uL — ABNORMAL LOW (ref 150–400)
RBC: 4.4 MIL/uL (ref 4.22–5.81)
RDW: 14.4 % (ref 11.5–15.5)
WBC: 4.4 10*3/uL (ref 4.0–10.5)

## 2016-12-01 LAB — HIV ANTIBODY (ROUTINE TESTING W REFLEX): HIV Screen 4th Generation wRfx: NONREACTIVE

## 2016-12-01 LAB — GLUCOSE, CAPILLARY
Glucose-Capillary: 233 mg/dL — ABNORMAL HIGH (ref 65–99)
Glucose-Capillary: 220 mg/dL — ABNORMAL HIGH (ref 65–99)

## 2016-12-01 NOTE — Discharge Summary (Signed)
Physician Discharge Summary  Patient ID: Victor [name] MRN: [id number] DOB/AGE: 06/09/1960 56 y.o.  Admit date: 11/30/2016 Discharge date: 12/01/2016  Admission Diagnoses:  Osteoarthritis of right acromioclavicular joint  Discharge Diagnoses:  Principal Problem:   Osteoarthritis of right acromioclavicular joint Active Problems:   Chronic pain in right shoulder   Subacromial impingement of right shoulder   Chronic right shoulder pain   Past Medical History:  Diagnosis Date  . Anxiety    takes Xanax daily as needed  . Blood dyscrasia    thrombocytopenia   . Blood transfusion    platelets  . C. difficile colitis 03/2015  . Depression    Emsam patch daily  . Diabetes mellitus     Type II   . GERD (gastroesophageal reflux disease)    takes Protonix daily  . Hepatitis    hepatitis b/ newly diagnosed with portal hypertension  . Hepatitis B virus infection 03/2007  . History of kidney stones    passed- x 2  . History of shingles   . Hyperlipidemia    takes AtorvaStatin daily  . Hypertension    takes Atenolol and Lisinopril daily  . Obstructive sleep apnea (adult) (pediatric)    mild with AHI 11.61/hr now on CPAP at 15cm h2o, CPAP is used q night   . Osteoarthritis   . Pancreatitis   . Sciatica    lumbar region - 2010, also has had numerous injections   . Splenomegaly    LOV Dr Lamonte Sakai  12/12 on chart  . Thrombocytopenia ([place])     Surgeries: Procedure(s): Right shoulder arthroscopy, extensive debridement and subacromial decompression on 11/30/2016   Consultants (if any):   Discharged Condition: Improved  Hospital Course: Victor [name] is an 56 y.o. male who was admitted 11/30/2016 with a diagnosis of Osteoarthritis of right acromioclavicular joint and went to the operating room on 11/30/2016 and underwent the above named procedures.    He was given perioperative antibiotics:  Anti-infectives    Start     Dose/Rate Route Frequency Ordered Stop   11/30/16 1030   ceFAZolin (ANCEF) 3 g in dextrose 5 % 50 mL IVPB     3 g 130 mL/hr over 30 Minutes Intravenous On call to O.R. 11/30/16 1024 11/30/16 1339    .  He was given sequential compression devices and early ambulation for DVT prophylaxis.  He benefited maximally from the hospital stay and there were no complications.    Recent vital signs:  Vitals:   11/30/16 2100 12/01/16 0700  BP: (!) 152/71 (!) 144/70  Pulse: 96 83  Resp:  16  Temp: 98.5 F (36.9 C) 97.9 F (36.6 C)  SpO2: 98% 97%    Recent laboratory studies:  Lab Results  Component Value Date   HGB 12.7 (L) 12/01/2016   HGB 14.9 11/30/2016   HGB 14.6 03/14/2016   Lab Results  Component Value Date   WBC 4.4 12/01/2016   PLT 57 (L) 12/01/2016   Lab Results  Component Value Date   INR 1.23 11/30/2016   Lab Results  Component Value Date   NA 136 11/30/2016   K 3.9 11/30/2016   CL 105 11/30/2016   CO2 23 11/30/2016   BUN 9 11/30/2016   CREATININE 0.83 11/30/2016   GLUCOSE 189 (H) 11/30/2016    Discharge Medications:   Allergies as of 12/01/2016      Reactions   Canagliflozin Itching, Other (See Comments)   Yeast infections  pancreatitis   Cyclobenzaprine Anaphylaxis,  Other (See Comments)   REACTION:  Not compatible with Emsam patch---not taking this patch any longer   Gabapentin Other (See Comments)   Other reaction(s): shortness of breath/tardive dyskinesia   Tanzeum [albiglutide] Other (See Comments)   PANCREATITIS   Vicodin [hydrocodone-acetaminophen] Hives, Other (See Comments)   Other reaction(s): hives/itching (although he is able to tolerate Percocet) REACTION: pt turns red and has hot flashes   Nsaids Diarrhea, Other (See Comments)   Other reaction(s): stomach upset Stomach Upset      Medication List    TAKE these medications   alprazolam 2 MG tablet Commonly known as:  XANAX Take 2 mg by mouth every 8 (eight) hours as needed (for anxiety/sleep).   atenolol 25 MG tablet Commonly known as:   TENORMIN Take 25 mg by mouth daily.   atorvastatin 40 MG tablet Commonly known as:  LIPITOR Take 40 mg by mouth daily before breakfast.   Fish Oil 1000 MG Caps Take 1,000 mg by mouth daily.   HYDROmorphone 8 MG tablet Commonly known as:  DILAUDID Take 1 tablet (8 mg total) by mouth every 4 (four) hours as needed for moderate pain or severe pain.   ibuprofen 200 MG tablet Commonly known as:  ADVIL,MOTRIN Take 400 mg by mouth every 8 (eight) hours as needed (for pain.).   insulin NPH Human 100 UNIT/ML injection Commonly known as:  HUMULIN N,NOVOLIN N Inject 40 Units into the skin 2 (two) times daily.   lisinopril 5 MG tablet Commonly known as:  PRINIVIL,ZESTRIL Take 5 mg by mouth daily.   metFORMIN 500 MG 24 hr tablet Commonly known as:  GLUCOPHAGE-XR Take 1,000 mg by mouth 2 (two) times daily.   pantoprazole 40 MG tablet Commonly known as:  PROTONIX Take 40 mg by mouth daily before breakfast.   traZODone 50 MG tablet Commonly known as:  DESYREL Take 50-100 mg by mouth at bedtime.   vitamin E 400 UNIT capsule Take 400 Units by mouth daily.       Diagnostic Studies: No results found.  Disposition: 01-Home or Self Care  Discharge Instructions    Call MD / Call 911    Complete by:  As directed    If you experience chest pain or shortness of breath, CALL 911 and be transported to the hospital emergency room.  If you develope a fever above 101 F, pus (white drainage) or increased drainage or redness at the wound, or calf pain, call your surgeon's office.   Constipation Prevention    Complete by:  As directed    Drink plenty of fluids.  Prune juice may be helpful.  You may use a stool softener, such as Colace (over the counter) 100 mg twice a day.  Use MiraLax (over the counter) for constipation as needed.   Diet - low sodium heart healthy    Complete by:  As directed    Discharge instructions    Complete by:  As directed    See Dr. Stann Mainland' discharge instructions    Increase activity slowly as tolerated    Complete by:  As directed       Follow-up Information    Nicholes Stairs, MD. Call in 2 week(s).   Specialty:  Orthopedic Surgery Contact information: [address] [place] [address] [phone]            Signed: Elie Goody 12/01/2016, 11:02 AM

## 2016-12-01 NOTE — Progress Notes (Signed)
   Subjective:  Patient reports pain as mild to moderate.  No c/o. Wants to go home.  Objective:   VITALS:   Vitals:   11/30/16 1541 11/30/16 1601 11/30/16 2100 12/01/16 0700  BP: 125/73 134/67 (!) 152/71 (!) 144/70  Pulse: 66 65 96 83  Resp: 20 18  16   Temp:  (!) 97.5 F (36.4 C) 98.5 F (36.9 C) 97.9 F (36.6 C)  TempSrc:  Oral Oral Oral  SpO2: 94% 96% 98% 97%  Weight:      Height:        NAD ABD soft Sensation intact distally Intact pulses distally Incision: dressing C/D/I (+) AIN, PIN, U SILT 2+ radial  Sling intact  Lab Results  Component Value Date   WBC 4.4 12/01/2016   HGB 12.7 (L) 12/01/2016   HCT 37.6 (L) 12/01/2016   MCV 85.5 12/01/2016   PLT 57 (L) 12/01/2016   BMET    Component Value Date/Time   NA 136 11/30/2016 1030   K 3.9 11/30/2016 1030   CL 105 11/30/2016 1030   CO2 23 11/30/2016 1030   GLUCOSE 189 (H) 11/30/2016 1030   BUN 9 11/30/2016 1030   CREATININE 0.83 11/30/2016 1030   CALCIUM 9.3 11/30/2016 1030   GFRNONAA >60 11/30/2016 1030   GFRAA >60 11/30/2016 1030     Assessment/Plan: 1 Day Post-Op   Principal Problem:   Osteoarthritis of right acromioclavicular joint Active Problems:   Chronic pain in right shoulder   Subacromial impingement of right shoulder   Chronic right shoulder pain   WBAT RUE Sling for comfort PO pain control D/C home today Begin outpatient PT this week   Efren Kross, Horald Pollen 12/01/2016, 10:59 AM   Rod Can, MD Cell [phone]

## 2016-12-07 DIAGNOSIS — M25511 Pain in right shoulder: Secondary | ICD-10-CM | POA: Diagnosis not present

## 2016-12-10 DIAGNOSIS — Z4789 Encounter for other orthopedic aftercare: Secondary | ICD-10-CM | POA: Diagnosis not present

## 2016-12-11 DIAGNOSIS — M25511 Pain in right shoulder: Secondary | ICD-10-CM | POA: Diagnosis not present

## 2016-12-13 DIAGNOSIS — M25511 Pain in right shoulder: Secondary | ICD-10-CM | POA: Diagnosis not present

## 2016-12-18 DIAGNOSIS — M25511 Pain in right shoulder: Secondary | ICD-10-CM | POA: Diagnosis not present

## 2016-12-20 DIAGNOSIS — M25511 Pain in right shoulder: Secondary | ICD-10-CM | POA: Diagnosis not present

## 2016-12-25 DIAGNOSIS — M25511 Pain in right shoulder: Secondary | ICD-10-CM | POA: Diagnosis not present

## 2016-12-27 DIAGNOSIS — M25511 Pain in right shoulder: Secondary | ICD-10-CM | POA: Diagnosis not present

## 2017-01-01 DIAGNOSIS — M25511 Pain in right shoulder: Secondary | ICD-10-CM | POA: Diagnosis not present

## 2017-01-10 DIAGNOSIS — M25511 Pain in right shoulder: Secondary | ICD-10-CM | POA: Diagnosis not present

## 2017-01-10 DIAGNOSIS — Z4789 Encounter for other orthopedic aftercare: Secondary | ICD-10-CM | POA: Diagnosis not present

## 2017-01-15 DIAGNOSIS — J209 Acute bronchitis, unspecified: Secondary | ICD-10-CM | POA: Diagnosis not present

## 2017-01-18 IMAGING — RF DG THORACOLUMBAR SPINE 2V
1 series · 2 of 2 positions shown · non-contrast
Comparison: None.

CLINICAL DATA: Lumbar spinal cord stimulator placement.

EXAM:
DG C-ARM 61-120 MIN; THORACOLUMBAR SPINE - 2 VIEW

[Series 1: run · 2 of 2 slices shown]
[im 1/2]
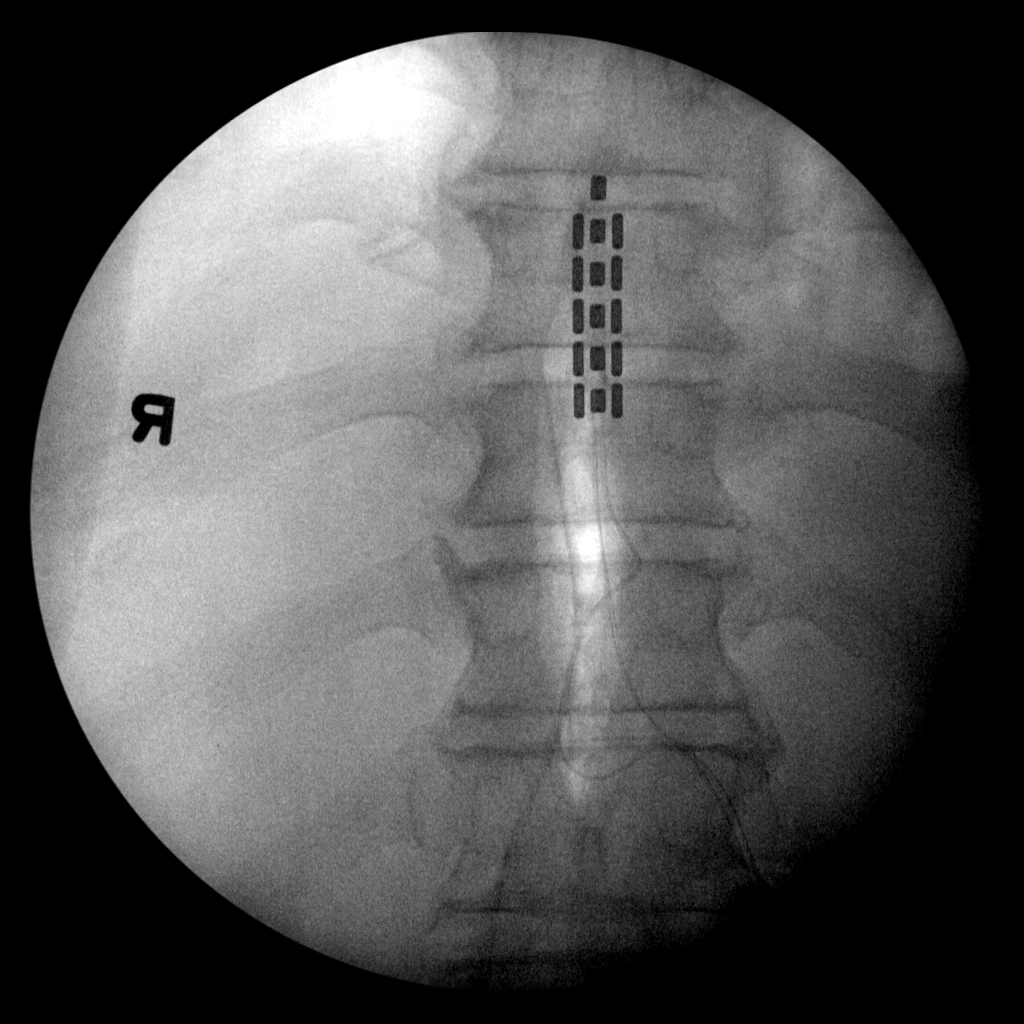
[im 2/2]
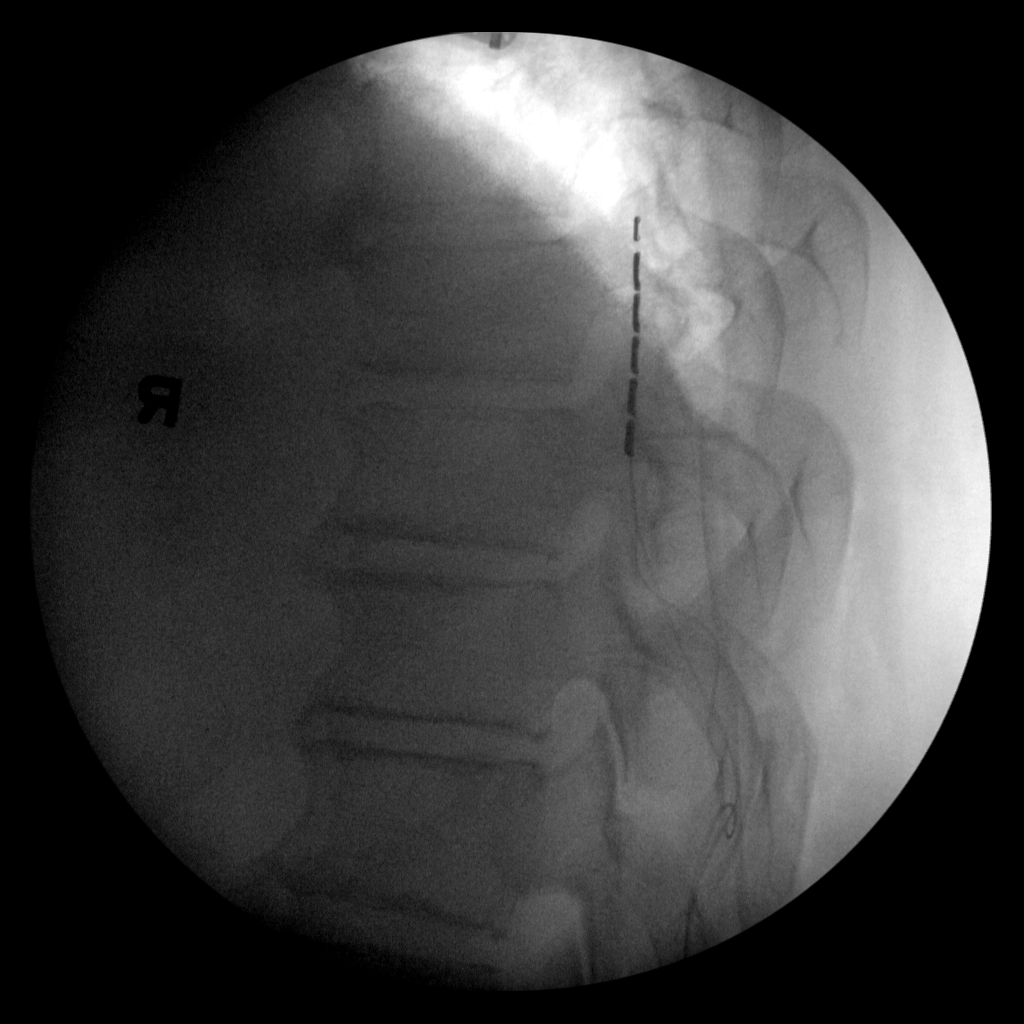

[2 of 2 positions shown; findings below may reference images not displayed]

FINDINGS: Two intraoperative spot images demonstrate spinal stimulator wires
projecting over the lower thoracic spinal canal.
IMPRESSION: Placement of spinal stimulator device in the lower thoracic region.

## 2017-01-22 DIAGNOSIS — M25511 Pain in right shoulder: Secondary | ICD-10-CM | POA: Diagnosis not present

## 2017-01-24 ENCOUNTER — Telehealth: Payer: Self-pay | Admitting: Cardiology

## 2017-01-24 NOTE — Telephone Encounter (Signed)
New Message     1) What problem are you experiencing?  Has been trying to get his cpap supplies and [facility] said they are not getting a response back for supplies   2) Who is your medical equipment company? [facility]    Please route to the sleep study assistant.

## 2017-01-28 DIAGNOSIS — E782 Mixed hyperlipidemia: Secondary | ICD-10-CM | POA: Diagnosis not present

## 2017-01-28 DIAGNOSIS — Z6839 Body mass index (BMI) 39.0-39.9, adult: Secondary | ICD-10-CM | POA: Diagnosis not present

## 2017-01-28 DIAGNOSIS — Z794 Long term (current) use of insulin: Secondary | ICD-10-CM | POA: Diagnosis not present

## 2017-01-28 DIAGNOSIS — F411 Generalized anxiety disorder: Secondary | ICD-10-CM | POA: Diagnosis not present

## 2017-01-28 DIAGNOSIS — E1165 Type 2 diabetes mellitus with hyperglycemia: Secondary | ICD-10-CM | POA: Diagnosis not present

## 2017-01-28 DIAGNOSIS — K219 Gastro-esophageal reflux disease without esophagitis: Secondary | ICD-10-CM | POA: Diagnosis not present

## 2017-01-28 DIAGNOSIS — G4700 Insomnia, unspecified: Secondary | ICD-10-CM | POA: Diagnosis not present

## 2017-01-28 DIAGNOSIS — Z7984 Long term (current) use of oral hypoglycemic drugs: Secondary | ICD-10-CM | POA: Diagnosis not present

## 2017-01-28 DIAGNOSIS — M545 Low back pain: Secondary | ICD-10-CM | POA: Diagnosis not present

## 2017-01-28 DIAGNOSIS — I10 Essential (primary) hypertension: Secondary | ICD-10-CM | POA: Diagnosis not present

## 2017-01-28 DIAGNOSIS — D696 Thrombocytopenia, unspecified: Secondary | ICD-10-CM | POA: Diagnosis not present

## 2017-01-28 NOTE — Telephone Encounter (Signed)
Sleep assistant called the patient to see what she could help him with and the patient stated he needed his supplies. Sleep assistant reached out to [facility] to see what she needed to do to get the patient his supplies. [facility] states they sent over a form to be sighed for the patient. The form has been signed and waiting to be faxed. Patient has been notified.

## 2017-01-29 DIAGNOSIS — M25511 Pain in right shoulder: Secondary | ICD-10-CM | POA: Diagnosis not present

## 2017-02-07 ENCOUNTER — Ambulatory Visit: Payer: Medicare Other | Admitting: Cardiology

## 2017-02-07 DIAGNOSIS — M25511 Pain in right shoulder: Secondary | ICD-10-CM | POA: Diagnosis not present

## 2017-02-21 DIAGNOSIS — M961 Postlaminectomy syndrome, not elsewhere classified: Secondary | ICD-10-CM | POA: Diagnosis not present

## 2017-02-21 DIAGNOSIS — G894 Chronic pain syndrome: Secondary | ICD-10-CM | POA: Diagnosis not present

## 2017-02-21 DIAGNOSIS — Z79899 Other long term (current) drug therapy: Secondary | ICD-10-CM | POA: Diagnosis not present

## 2017-02-21 DIAGNOSIS — M545 Low back pain: Secondary | ICD-10-CM | POA: Diagnosis not present

## 2017-02-26 DIAGNOSIS — F331 Major depressive disorder, recurrent, moderate: Secondary | ICD-10-CM | POA: Diagnosis not present

## 2017-02-27 NOTE — Progress Notes (Signed)
Cardiology Office Note:    Date:  02/28/2017   ID:  Victor [name], DOB 1960-08-20, MRN [id number]  PCP:  Antony Contras, MD  Cardiologist:  No primary care provider on file.    Referring MD: Antony Contras, MD   Chief Complaint  Patient presents with  . Sleep Apnea  . Hypertension    History of Present Illness:    Victor [name] is a 57 y.o. male with a hx of OSA, obesity and HTN .  He has a history of mild OSA with an AHI of 11.61/hr and now on CPAP at 15cm H2O.  He is doing well with his CPAP device.  He tolerates the nasal mask and feels the pressure is adequate. He says that he does not sleep well at night due to shoulder pain and therefore does not feel rested in the am and has to nap during the day.  He denies any significant mouth or nasal dryness or nasal congestion.  He does not think that he snores.     Past Medical History:  Diagnosis Date  . Anxiety    takes Xanax daily as needed  . Blood dyscrasia    thrombocytopenia   . Blood transfusion    platelets  . C. difficile colitis 03/2015  . Depression    Emsam patch daily  . Diabetes mellitus     Type II   . GERD (gastroesophageal reflux disease)    takes Protonix daily  . Hepatitis    hepatitis b/ newly diagnosed with portal hypertension  . Hepatitis B virus infection 03/2007  . History of kidney stones    passed- x 2  . History of shingles   . Hyperlipidemia    takes AtorvaStatin daily  . Hypertension    takes Atenolol and Lisinopril daily  . Obstructive sleep apnea (adult) (pediatric)    mild with AHI 11.61/hr now on CPAP at 15cm h2o, CPAP is used q night   . Osteoarthritis   . Pancreatitis   . Sciatica    lumbar region - 2010, also has had numerous injections   . Splenomegaly    LOV Dr Lamonte Sakai  12/12 on chart  . Thrombocytopenia ([place])     Past Surgical History:  Procedure Laterality Date  . CHOLECYSTECTOMY N/A 04/26/2015   Procedure: LAPAROSCOPIC CHOLECYSTECTOMY;  Surgeon: Ralene Ok, MD;  Location:  [place];  Service: General;  Laterality: N/A;  . COLONOSCOPY W/ POLYPECTOMY    . HAND SURGERY Right    right hand x 2- injured  . KNEE SURGERY     5 surgeries to right knee, 3 surgeries to left knee  . KYPHOPLASTY    . LUMBAR LAMINECTOMY/DECOMPRESSION MICRODISCECTOMY  02/19/2011   Procedure: LUMBAR LAMINECTOMY/DECOMPRESSION MICRODISCECTOMY;  Surgeon: Johnn Hai;  Location: [facility];  Service: Orthopedics;  Laterality: Left;  decompression l5-s1 l4-5 on left  . SHOULDER ARTHROSCOPY WITH SUBACROMIAL DECOMPRESSION Right 11/30/2016   Procedure: Right shoulder arthroscopy, extensive debridement and subacromial decompression;  Surgeon: Nicholes Stairs, MD;  Location: [facility];  Service: Orthopedics;  Laterality: Right;  80  . SHOULDER SURGERY Left    left shoulder, Rotar Cuff  . SPINAL CORD STIMULATOR BATTERY EXCHANGE N/A 03/14/2016   Procedure: Revision of spinal cord stimulator battery;  Surgeon: Melina Schools, MD;  Location: [place];  Service: Orthopedics;  Laterality: N/A;  Requests 1 hour  . SPINAL CORD STIMULATOR INSERTION N/A 02/02/2015   Procedure: LUMBAR SPINAL CORD STIMULATOR INSERTION;  Surgeon: Melina Schools, MD;  Location: [place];  Service: Orthopedics;  Laterality: N/A;    Current Medications: Current Meds  Medication Sig  . alprazolam (XANAX) 2 MG tablet Take 2 mg by mouth every 8 (eight) hours as needed (for anxiety/sleep).   Marland Kitchen atenolol (TENORMIN) 25 MG tablet Take 25 mg by mouth daily.   Marland Kitchen atorvastatin (LIPITOR) 40 MG tablet Take 40 mg by mouth daily before breakfast.   . HYDROmorphone (DILAUDID) 8 MG tablet Take 1 tablet (8 mg total) by mouth every 4 (four) hours as needed for moderate pain or severe pain.  Marland Kitchen insulin NPH Human (HUMULIN N,NOVOLIN N) 100 UNIT/ML injection Inject 40 Units into the skin 2 (two) times daily.  Marland Kitchen lisinopril (PRINIVIL,ZESTRIL) 5 MG tablet Take 5 mg by mouth daily.  . Omega-3 Fatty Acids (FISH OIL) 1000 MG CAPS Take 1,000 mg by mouth daily.  .  pantoprazole (PROTONIX) 40 MG tablet Take 40 mg by mouth daily before breakfast.   . traZODone (DESYREL) 50 MG tablet Take 50-100 mg by mouth at bedtime.  . vitamin E 400 UNIT capsule Take 400 Units by mouth daily.     Allergies:   Canagliflozin; Cyclobenzaprine; Gabapentin; Tanzeum [albiglutide]; Hydrocodone-acetaminophen; Metformin and related; Nsaids; and Vicodin [hydrocodone-acetaminophen]   Social History   Socioeconomic History  . Marital status: Married    Spouse name: None  . Number of children: None  . Years of education: None  . Highest education level: None  Social Needs  . Financial resource strain: None  . Food insecurity - worry: None  . Food insecurity - inability: None  . Transportation needs - medical: None  . Transportation needs - non-medical: None  Occupational History  . None  Tobacco Use  . Smoking status: Former Smoker    Packs/day: 1.00    Years: 15.00    Pack years: 15.00    Last attempt to quit: 12/31/1985    Years since quitting: 31.1  . Smokeless tobacco: Never Used  Substance and Sexual Activity  . Alcohol use: No  . Drug use: No  . Sexual activity: None  Other Topics Concern  . None  Social History Narrative  . None     Family History: The patient's family history includes Cancer in his mother.  ROS:   Please see the history of present illness.    ROS  All other systems reviewed and negative.   EKGs/Labs/Other Studies Reviewed:    The following studies were reviewed today: CPAP download  EKG:  EKG is not ordered today.   Recent Labs: 11/30/2016: ALT 23; BUN 9; Creatinine, Ser 0.83; Potassium 3.9; Sodium 136 12/01/2016: Hemoglobin 12.7; Platelets 57   Recent Lipid Panel No results found for: CHOL, TRIG, HDL, CHOLHDL, VLDL, LDLCALC, LDLDIRECT  Physical Exam:    VS:  BP 126/73   Pulse 71   Ht 6' (1.829 m)   Wt 273 lb (123.8 kg)   BMI 37.03 kg/m     Wt Readings from Last 3 Encounters:  02/28/17 273 lb (123.8 kg)    11/30/16 277 lb 12.5 oz (126 kg)  03/14/16 280 lb (127 kg)     GEN:  Well nourished, well developed in no acute distress HEENT: Normal NECK: No JVD; No carotid bruits LYMPHATICS: No lymphadenopathy CARDIAC: RRR, no murmurs, rubs, gallops RESPIRATORY:  Clear to auscultation without rales, wheezing or rhonchi  ABDOMEN: Soft, non-tender, non-distended MUSCULOSKELETAL:  No edema; No deformity  SKIN: Warm and dry NEUROLOGIC:  Alert and oriented x 3 PSYCHIATRIC:  Normal  affect   ASSESSMENT:    1. Obstructive sleep apnea   2. Essential hypertension   3. Class 2 severe obesity due to excess calories with serious comorbidity in adult, unspecified BMI ([place])    PLAN:    In order of problems listed above:  1.  OSA - the patient is tolerating PAP therapy well without any problems. The PAP download was reviewed today and showed an AHI of 0.7/hr on 7 cm H2O with 80% compliance in using more than 4 hours nightly.  The patient has been using and benefiting from PAP use and will continue to benefit from therapy.   2.  HTN - BP is well controlled on exam today.  He will continue on Atenolol 25mg  daily and lisinopril 5mg  daily.    3.  Obesity - His exercise is limited by significant orthopedic problems.     Medication Adjustments/Labs and Tests Ordered: Current medicines are reviewed at length with the patient today.  Concerns regarding medicines are outlined above.  No orders of the defined types were placed in this encounter.  No orders of the defined types were placed in this encounter.   Signed, Fransico Him, MD  02/28/2017 9:59 AM    [place]

## 2017-02-28 ENCOUNTER — Encounter: Payer: Self-pay | Admitting: Cardiology

## 2017-02-28 ENCOUNTER — Ambulatory Visit (INDEPENDENT_AMBULATORY_CARE_PROVIDER_SITE_OTHER): Payer: Medicare Other | Admitting: Cardiology

## 2017-02-28 VITALS — BP 126/73 | HR 71 | Ht 72.0 in | Wt 273.0 lb

## 2017-02-28 DIAGNOSIS — I10 Essential (primary) hypertension: Secondary | ICD-10-CM

## 2017-02-28 DIAGNOSIS — G4733 Obstructive sleep apnea (adult) (pediatric): Secondary | ICD-10-CM

## 2017-02-28 DIAGNOSIS — M7501 Adhesive capsulitis of right shoulder: Secondary | ICD-10-CM | POA: Diagnosis not present

## 2017-02-28 DIAGNOSIS — M7541 Impingement syndrome of right shoulder: Secondary | ICD-10-CM | POA: Diagnosis not present

## 2017-02-28 NOTE — Patient Instructions (Signed)

## 2017-03-08 DIAGNOSIS — E785 Hyperlipidemia, unspecified: Secondary | ICD-10-CM | POA: Diagnosis not present

## 2017-03-08 DIAGNOSIS — Z794 Long term (current) use of insulin: Secondary | ICD-10-CM | POA: Diagnosis not present

## 2017-03-08 DIAGNOSIS — E1159 Type 2 diabetes mellitus with other circulatory complications: Secondary | ICD-10-CM | POA: Diagnosis not present

## 2017-03-08 DIAGNOSIS — E1165 Type 2 diabetes mellitus with hyperglycemia: Secondary | ICD-10-CM | POA: Diagnosis not present

## 2017-03-08 DIAGNOSIS — E1140 Type 2 diabetes mellitus with diabetic neuropathy, unspecified: Secondary | ICD-10-CM | POA: Diagnosis not present

## 2017-03-08 DIAGNOSIS — E1169 Type 2 diabetes mellitus with other specified complication: Secondary | ICD-10-CM | POA: Diagnosis not present

## 2017-03-08 DIAGNOSIS — I10 Essential (primary) hypertension: Secondary | ICD-10-CM | POA: Diagnosis not present

## 2017-05-21 DIAGNOSIS — R7309 Other abnormal glucose: Secondary | ICD-10-CM | POA: Diagnosis not present

## 2017-05-21 DIAGNOSIS — E1165 Type 2 diabetes mellitus with hyperglycemia: Secondary | ICD-10-CM | POA: Diagnosis not present

## 2017-05-21 DIAGNOSIS — G894 Chronic pain syndrome: Secondary | ICD-10-CM | POA: Diagnosis not present

## 2017-05-21 DIAGNOSIS — M545 Low back pain: Secondary | ICD-10-CM | POA: Diagnosis not present

## 2017-05-22 DIAGNOSIS — E1140 Type 2 diabetes mellitus with diabetic neuropathy, unspecified: Secondary | ICD-10-CM | POA: Diagnosis not present

## 2017-05-22 DIAGNOSIS — Z8719 Personal history of other diseases of the digestive system: Secondary | ICD-10-CM | POA: Diagnosis not present

## 2017-05-22 DIAGNOSIS — E119 Type 2 diabetes mellitus without complications: Secondary | ICD-10-CM | POA: Diagnosis not present

## 2017-05-22 DIAGNOSIS — E1165 Type 2 diabetes mellitus with hyperglycemia: Secondary | ICD-10-CM | POA: Diagnosis not present

## 2017-05-22 DIAGNOSIS — R0789 Other chest pain: Secondary | ICD-10-CM | POA: Diagnosis not present

## 2017-05-22 DIAGNOSIS — D696 Thrombocytopenia, unspecified: Secondary | ICD-10-CM | POA: Diagnosis not present

## 2017-05-22 DIAGNOSIS — Z79899 Other long term (current) drug therapy: Secondary | ICD-10-CM | POA: Diagnosis not present

## 2017-05-22 DIAGNOSIS — I10 Essential (primary) hypertension: Secondary | ICD-10-CM | POA: Diagnosis not present

## 2017-05-22 DIAGNOSIS — Z862 Personal history of diseases of the blood and blood-forming organs and certain disorders involving the immune mechanism: Secondary | ICD-10-CM | POA: Diagnosis not present

## 2017-05-22 DIAGNOSIS — R079 Chest pain, unspecified: Secondary | ICD-10-CM | POA: Diagnosis not present

## 2017-05-22 DIAGNOSIS — K760 Fatty (change of) liver, not elsewhere classified: Secondary | ICD-10-CM | POA: Diagnosis not present

## 2017-05-22 DIAGNOSIS — E785 Hyperlipidemia, unspecified: Secondary | ICD-10-CM | POA: Diagnosis not present

## 2017-05-22 DIAGNOSIS — E669 Obesity, unspecified: Secondary | ICD-10-CM | POA: Diagnosis not present

## 2017-05-22 DIAGNOSIS — Z87891 Personal history of nicotine dependence: Secondary | ICD-10-CM | POA: Diagnosis not present

## 2017-05-22 DIAGNOSIS — K219 Gastro-esophageal reflux disease without esophagitis: Secondary | ICD-10-CM | POA: Diagnosis not present

## 2017-05-22 DIAGNOSIS — E871 Hypo-osmolality and hyponatremia: Secondary | ICD-10-CM | POA: Diagnosis not present

## 2017-05-22 DIAGNOSIS — Z794 Long term (current) use of insulin: Secondary | ICD-10-CM | POA: Diagnosis not present

## 2017-06-03 DIAGNOSIS — Z6838 Body mass index (BMI) 38.0-38.9, adult: Secondary | ICD-10-CM | POA: Diagnosis not present

## 2017-06-03 DIAGNOSIS — D696 Thrombocytopenia, unspecified: Secondary | ICD-10-CM | POA: Diagnosis not present

## 2017-06-03 DIAGNOSIS — Z09 Encounter for follow-up examination after completed treatment for conditions other than malignant neoplasm: Secondary | ICD-10-CM | POA: Diagnosis not present

## 2017-06-03 DIAGNOSIS — F411 Generalized anxiety disorder: Secondary | ICD-10-CM | POA: Diagnosis not present

## 2017-06-03 DIAGNOSIS — R0789 Other chest pain: Secondary | ICD-10-CM | POA: Diagnosis not present

## 2017-06-03 DIAGNOSIS — E669 Obesity, unspecified: Secondary | ICD-10-CM | POA: Diagnosis not present

## 2017-06-10 DIAGNOSIS — E785 Hyperlipidemia, unspecified: Secondary | ICD-10-CM | POA: Diagnosis not present

## 2017-06-10 DIAGNOSIS — E1165 Type 2 diabetes mellitus with hyperglycemia: Secondary | ICD-10-CM | POA: Diagnosis not present

## 2017-06-10 DIAGNOSIS — Z794 Long term (current) use of insulin: Secondary | ICD-10-CM | POA: Diagnosis not present

## 2017-06-10 DIAGNOSIS — I10 Essential (primary) hypertension: Secondary | ICD-10-CM | POA: Diagnosis not present

## 2017-06-10 DIAGNOSIS — E1140 Type 2 diabetes mellitus with diabetic neuropathy, unspecified: Secondary | ICD-10-CM | POA: Diagnosis not present

## 2017-06-10 DIAGNOSIS — E1159 Type 2 diabetes mellitus with other circulatory complications: Secondary | ICD-10-CM | POA: Diagnosis not present

## 2017-06-10 DIAGNOSIS — E1169 Type 2 diabetes mellitus with other specified complication: Secondary | ICD-10-CM | POA: Diagnosis not present

## 2017-08-13 DIAGNOSIS — F331 Major depressive disorder, recurrent, moderate: Secondary | ICD-10-CM | POA: Diagnosis not present

## 2017-08-30 DIAGNOSIS — E1165 Type 2 diabetes mellitus with hyperglycemia: Secondary | ICD-10-CM | POA: Diagnosis not present

## 2017-08-30 DIAGNOSIS — E782 Mixed hyperlipidemia: Secondary | ICD-10-CM | POA: Diagnosis not present

## 2017-08-30 DIAGNOSIS — I10 Essential (primary) hypertension: Secondary | ICD-10-CM | POA: Diagnosis not present

## 2017-08-30 DIAGNOSIS — D696 Thrombocytopenia, unspecified: Secondary | ICD-10-CM | POA: Diagnosis not present

## 2017-08-30 DIAGNOSIS — Z0000 Encounter for general adult medical examination without abnormal findings: Secondary | ICD-10-CM | POA: Diagnosis not present

## 2017-08-30 DIAGNOSIS — Z1211 Encounter for screening for malignant neoplasm of colon: Secondary | ICD-10-CM | POA: Diagnosis not present

## 2017-08-30 DIAGNOSIS — K219 Gastro-esophageal reflux disease without esophagitis: Secondary | ICD-10-CM | POA: Diagnosis not present

## 2017-08-30 DIAGNOSIS — G4700 Insomnia, unspecified: Secondary | ICD-10-CM | POA: Diagnosis not present

## 2017-08-30 DIAGNOSIS — M545 Low back pain: Secondary | ICD-10-CM | POA: Diagnosis not present

## 2017-08-30 DIAGNOSIS — F411 Generalized anxiety disorder: Secondary | ICD-10-CM | POA: Diagnosis not present

## 2017-08-30 DIAGNOSIS — Z125 Encounter for screening for malignant neoplasm of prostate: Secondary | ICD-10-CM | POA: Diagnosis not present

## 2017-09-09 DIAGNOSIS — I10 Essential (primary) hypertension: Secondary | ICD-10-CM | POA: Diagnosis not present

## 2017-09-09 DIAGNOSIS — E1140 Type 2 diabetes mellitus with diabetic neuropathy, unspecified: Secondary | ICD-10-CM | POA: Diagnosis not present

## 2017-09-09 DIAGNOSIS — Z794 Long term (current) use of insulin: Secondary | ICD-10-CM | POA: Diagnosis not present

## 2017-09-09 DIAGNOSIS — E1165 Type 2 diabetes mellitus with hyperglycemia: Secondary | ICD-10-CM | POA: Diagnosis not present

## 2017-09-09 DIAGNOSIS — E1159 Type 2 diabetes mellitus with other circulatory complications: Secondary | ICD-10-CM | POA: Diagnosis not present

## 2017-09-30 DIAGNOSIS — Z79891 Long term (current) use of opiate analgesic: Secondary | ICD-10-CM | POA: Diagnosis not present

## 2017-09-30 DIAGNOSIS — G894 Chronic pain syndrome: Secondary | ICD-10-CM | POA: Diagnosis not present

## 2017-09-30 DIAGNOSIS — G4700 Insomnia, unspecified: Secondary | ICD-10-CM | POA: Diagnosis not present

## 2017-11-14 ENCOUNTER — Other Ambulatory Visit: Payer: Self-pay

## 2017-11-14 MED ORDER — MIRTAZAPINE 7.5 MG PO TABS: 7.5000 mg | ORAL_TABLET | Freq: Every day | ORAL | 0 refills | Status: DC

## 2017-11-14 NOTE — Progress Notes (Signed)
Sent patient a refill for 90day supply to pharmacy

## 2017-11-19 ENCOUNTER — Telehealth: Payer: Self-pay | Admitting: Cardiology

## 2017-11-19 NOTE — Telephone Encounter (Signed)
New Message       Patient is calling today to get a new Rx for a pap machine. Patient states it's giving him trouble and he has had it for more than 5 years.

## 2017-11-20 DIAGNOSIS — Z23 Encounter for immunization: Secondary | ICD-10-CM | POA: Diagnosis not present

## 2017-11-20 NOTE — Telephone Encounter (Signed)
Patient called stating he needs a new PAP device.  His is > 55 years old.  Please find out if insurance requires OV prior to ordering new device

## 2017-11-20 NOTE — Telephone Encounter (Signed)
Patient last OV was 02/28/17. Patient states his cpap works sometimes and sometimes it does not. Patient wants a new one.

## 2017-11-21 NOTE — Telephone Encounter (Signed)
Reached out to [name]) who says yes the patient does need a recent office visit within 6 months before he can receive  a new unit.  Appointment scheduled for 01/06/18 at 11:40.

## 2017-12-03 DIAGNOSIS — M545 Low back pain: Secondary | ICD-10-CM | POA: Diagnosis not present

## 2017-12-03 DIAGNOSIS — M961 Postlaminectomy syndrome, not elsewhere classified: Secondary | ICD-10-CM | POA: Diagnosis not present

## 2017-12-12 ENCOUNTER — Encounter: Payer: Self-pay | Admitting: Cardiology

## 2018-01-06 ENCOUNTER — Ambulatory Visit (INDEPENDENT_AMBULATORY_CARE_PROVIDER_SITE_OTHER): Payer: Medicare Other | Admitting: Cardiology

## 2018-01-06 ENCOUNTER — Encounter: Payer: Self-pay | Admitting: Cardiology

## 2018-01-06 VITALS — BP 116/58 | HR 60 | Ht 72.0 in | Wt 285.8 lb

## 2018-01-06 DIAGNOSIS — I10 Essential (primary) hypertension: Secondary | ICD-10-CM | POA: Diagnosis not present

## 2018-01-06 DIAGNOSIS — G4733 Obstructive sleep apnea (adult) (pediatric): Secondary | ICD-10-CM | POA: Diagnosis not present

## 2018-01-06 NOTE — Patient Instructions (Signed)
Medication Instructions:  Your physician recommends that you continue on your current medications as directed. Please refer to the Current Medication list given to you today.  If you need a refill on your cardiac medications before your next appointment, please call your pharmacy.   Lab work:  If you have labs (blood work) drawn today and your tests are completely normal, you will receive your results only by: Marland Kitchen MyChart Message (if you have MyChart) OR . A paper copy in the mail If you have any lab test that is abnormal or we need to change your treatment, we will call you to review the results.  Follow-Up: 10 weeks after your receive your new CPAP Device.

## 2018-01-06 NOTE — Progress Notes (Signed)
Cardiology Office Note:    Date:  01/06/2018   ID:  Victor [name], DOB 1960-07-02, MRN [id number]  PCP:  Antony Contras, MD  Cardiologist:  No primary care provider on file.    Referring MD: Antony Contras, MD   Chief Complaint  Patient presents with  . Sleep Apnea  . Hypertension    History of Present Illness:    Victor [name] is a 57 y.o. male with a hx of OSA, obesity and HTN .  He has a history of mild OSA with an AHI of 11.61/hr and now on CPAP at 15cm H2O. He is doing well with his CPAP device.  He tolerates the mask and feels the pressure is adequate.  Since going on CPAP he feels rested in the am and has no significant daytime sleepiness.  He denies any significant mouth or nasal dryness or nasal congestion.  He does not think that he snores.  Recently he has had some problems with his machine getting water in it.  He has tried to adjust the humidity down but he still gets significant water in the device and now the device is shutting off during the night.  [facility] talk [facility] who said he was due to get a new device and he is here for follow-up so that he can get a new CPAP ordered.   Past Medical History:  Diagnosis Date  . Anxiety    takes Xanax daily as needed  . Blood dyscrasia    thrombocytopenia   . Blood transfusion    platelets  . C. difficile colitis 03/2015  . Depression    Emsam patch daily  . Diabetes mellitus     Type II   . GERD (gastroesophageal reflux disease)    takes Protonix daily  . Hepatitis    hepatitis b/ newly diagnosed with portal hypertension  . Hepatitis B virus infection 03/2007  . History of kidney stones    passed- x 2  . History of shingles   . Hyperlipidemia    takes AtorvaStatin daily  . Hypertension    takes Atenolol and Lisinopril daily  . Obstructive sleep apnea (adult) (pediatric)    mild with AHI 11.61/hr now on CPAP at 15cm h2o, CPAP is used q night   . Osteoarthritis   . Pancreatitis   . Sciatica    lumbar  region - 2010, also has had numerous injections   . Splenomegaly    LOV Dr Lamonte Sakai  12/12 on chart  . Thrombocytopenia ([place])     Past Surgical History:  Procedure Laterality Date  . CHOLECYSTECTOMY N/A 04/26/2015   Procedure: LAPAROSCOPIC CHOLECYSTECTOMY;  Surgeon: Ralene Ok, MD;  Location: [place];  Service: General;  Laterality: N/A;  . COLONOSCOPY W/ POLYPECTOMY    . HAND SURGERY Right    right hand x 2- injured  . KNEE SURGERY     5 surgeries to right knee, 3 surgeries to left knee  . KYPHOPLASTY    . LUMBAR LAMINECTOMY/DECOMPRESSION MICRODISCECTOMY  02/19/2011   Procedure: LUMBAR LAMINECTOMY/DECOMPRESSION MICRODISCECTOMY;  Surgeon: Johnn Hai;  Location: [facility];  Service: Orthopedics;  Laterality: Left;  decompression l5-s1 l4-5 on left  . SHOULDER ARTHROSCOPY WITH SUBACROMIAL DECOMPRESSION Right 11/30/2016   Procedure: Right shoulder arthroscopy, extensive debridement and subacromial decompression;  Surgeon: Nicholes Stairs, MD;  Location: [place];  Service: Orthopedics;  Laterality: Right;  80  . SHOULDER SURGERY Left    left shoulder, Rotar Cuff  .  SPINAL CORD STIMULATOR BATTERY EXCHANGE N/A 03/14/2016   Procedure: Revision of spinal cord stimulator battery;  Surgeon: Melina Schools, MD;  Location: [facility];  Service: Orthopedics;  Laterality: N/A;  Requests 1 hour  . SPINAL CORD STIMULATOR INSERTION N/A 02/02/2015   Procedure: LUMBAR SPINAL CORD STIMULATOR INSERTION;  Surgeon: Melina Schools, MD;  Location: [place];  Service: Orthopedics;  Laterality: N/A;    Current Medications: Current Meds  Medication Sig  . alprazolam (XANAX) 2 MG tablet Take 2 mg by mouth every 8 (eight) hours as needed (for anxiety/sleep).   Marland Kitchen atenolol (TENORMIN) 25 MG tablet Take 25 mg by mouth daily.   Marland Kitchen atorvastatin (LIPITOR) 40 MG tablet Take 40 mg by mouth daily before breakfast.   . HYDROmorphone (DILAUDID) 8 MG tablet Take 1 tablet (8 mg total) by mouth every 4 (four) hours as needed for moderate  pain or severe pain.  Marland Kitchen insulin NPH Human (HUMULIN N,NOVOLIN N) 100 UNIT/ML injection Inject 40 Units into the skin 2 (two) times daily.  Marland Kitchen lisinopril (PRINIVIL,ZESTRIL) 5 MG tablet Take 5 mg by mouth daily.  . pantoprazole (PROTONIX) 40 MG tablet Take 40 mg by mouth daily before breakfast.   . vitamin E 400 UNIT capsule Take 400 Units by mouth daily.     Allergies:   Canagliflozin; Cyclobenzaprine; Gabapentin; Tanzeum [albiglutide]; Hydrocodone-acetaminophen; Metformin and related; Nsaids; and Vicodin [hydrocodone-acetaminophen]   Social History   Socioeconomic History  . Marital status: Married    Spouse name: Not on file  . Number of children: Not on file  . Years of education: Not on file  . Highest education level: Not on file  Occupational History  . Not on file  Social Needs  . Financial resource strain: Not on file  . Food insecurity:    Worry: Not on file    Inability: Not on file  . Transportation needs:    Medical: Not on file    Non-medical: Not on file  Tobacco Use  . Smoking status: Former Smoker    Packs/day: 1.00    Years: 15.00    Pack years: 15.00    Last attempt to quit: 12/31/1985    Years since quitting: 32.0  . Smokeless tobacco: Never Used  Substance and Sexual Activity  . Alcohol use: No  . Drug use: No  . Sexual activity: Not on file  Lifestyle  . Physical activity:    Days per week: Not on file    Minutes per session: Not on file  . Stress: Not on file  Relationships  . Social connections:    Talks on phone: Not on file    Gets together: Not on file    Attends religious service: Not on file    Active member of club or organization: Not on file    Attends meetings of clubs or organizations: Not on file    Relationship status: Not on file  Other Topics Concern  . Not on file  Social History Narrative  . Not on file     Family History: The patient's family history includes Cancer in his mother.  ROS:   Please see the history of  present illness.    ROS  All other systems reviewed and negative.   EKGs/Labs/Other Studies Reviewed:    The following studies were reviewed today: PAP download  EKG:  EKG is not ordered today.  Recent Labs: No results found for requested labs within last 8760 hours.   Recent Lipid Panel No results  found for: CHOL, TRIG, HDL, CHOLHDL, VLDL, LDLCALC, LDLDIRECT  Physical Exam:    VS:  BP (!) 116/58   Pulse 60   Ht 6' (1.829 m)   Wt 285 lb 12.8 oz (129.6 kg)   SpO2 97%   BMI 38.76 kg/m     Wt Readings from Last 3 Encounters:  01/06/18 285 lb 12.8 oz (129.6 kg)  02/28/17 273 lb (123.8 kg)  11/30/16 277 lb 12.5 oz (126 kg)     GEN:  Well nourished, well developed in no acute distress HEENT: Normal NECK: No JVD; No carotid bruits LYMPHATICS: No lymphadenopathy CARDIAC: RRR, no murmurs, rubs, gallops RESPIRATORY:  Clear to auscultation without rales, wheezing or rhonchi  ABDOMEN: Soft, non-tender, non-distended MUSCULOSKELETAL:  No edema; No deformity  SKIN: Warm and dry NEUROLOGIC:  Alert and oriented x 3 PSYCHIATRIC:  Normal affect   ASSESSMENT:    1. Obstructive sleep apnea   2. Essential hypertension   3. Morbid obesity ([place])    PLAN:    In order of problems listed above:  1.  OSA - the patient is tolerating PAP therapy well without any problems. The PAP download was reviewed today and showed an AHI of 0.6/hr on 7 cm H2O with 83% compliance in using more than 4 hours nightly.  The patient has been using and benefiting from PAP use and will continue to benefit from therapy.  I will go ahead and order him a new CPAP device with a ResMed CPAP on 7 cm H2O with heated humidity and mask of choice as well as CPAP supplies.  He will follow-up with me in 10 weeks per insurance requirements to document compliance.  2.  HTN -BP is well controlled on exam today.  He will continue on atenolol 25 mg daily and lisinopril 5 mg daily.  His creatinine was 0.93 potassium 4  08/30/2017.  3.  Morbid Obesity -his BMI is 38 but he has significant comorbidities including hypertension hyperlipidemia and diabetes.  I have encouraged him to get into a routine exercise program and cut back on carbs and portions.    Medication Adjustments/Labs and Tests Ordered: Current medicines are reviewed at length with the patient today.  Concerns regarding medicines are outlined above.  No orders of the defined types were placed in this encounter.  No orders of the defined types were placed in this encounter.   Signed, Fransico Him, MD  01/06/2018 11:59 AM    [place]

## 2018-01-14 ENCOUNTER — Encounter: Payer: Self-pay | Admitting: Emergency Medicine

## 2018-01-14 DIAGNOSIS — F411 Generalized anxiety disorder: Secondary | ICD-10-CM | POA: Insufficient documentation

## 2018-01-14 DIAGNOSIS — G4700 Insomnia, unspecified: Secondary | ICD-10-CM | POA: Insufficient documentation

## 2018-01-28 ENCOUNTER — Ambulatory Visit (INDEPENDENT_AMBULATORY_CARE_PROVIDER_SITE_OTHER): Payer: Medicare Other | Admitting: Psychiatry

## 2018-01-28 ENCOUNTER — Encounter: Payer: Self-pay | Admitting: Psychiatry

## 2018-01-28 DIAGNOSIS — F3341 Major depressive disorder, recurrent, in partial remission: Secondary | ICD-10-CM | POA: Diagnosis not present

## 2018-01-28 DIAGNOSIS — F5105 Insomnia due to other mental disorder: Secondary | ICD-10-CM

## 2018-01-28 DIAGNOSIS — F4001 Agoraphobia with panic disorder: Secondary | ICD-10-CM | POA: Diagnosis not present

## 2018-01-28 DIAGNOSIS — F411 Generalized anxiety disorder: Secondary | ICD-10-CM

## 2018-01-28 MED ORDER — TRAZODONE HCL 100 MG PO TABS: 200.0000 mg | ORAL_TABLET | Freq: Every day | ORAL | 1 refills | Status: DC

## 2018-01-28 MED ORDER — DOXEPIN HCL 50 MG PO CAPS: 50.0000 mg | ORAL_CAPSULE | Freq: Every day | ORAL | 0 refills | Status: DC

## 2018-01-28 NOTE — Progress Notes (Signed)
Victor [name] [id number] May 19, 1960 56 y.o.  Subjective:   Patient ID:  Victor [name] is a 57 y.o. (DOB 10-01-1960) male.  Chief Complaint:  Chief Complaint  Patient presents with  . Follow-up    Medication Management  . Sleeping Problem  . Anxiety    HPI Victor [name] presents to the office today for follow-up of anxiety and sleep.  Lost benefit from trazodone.  Typical to bed 1130, lay 2-3 hours before sleep, gets up about 5:30.  Try nap but can't.  Back bothers him for 3 mos and has to lay down but can't sleep.  No med changes.  Anxiety about the same.  Tried doxepin 20 and mirtazapine 7.5 mg without benefit.  Uses CPAP.  New machine 3 weeks without changes.  Gets tired and drowsy in the day without sleep.  No caffeine after 9 pm.    Patient reports stable mood and denies depressed or irritable moods.  Patient denies any recent difficulty with anxiety.  Denies appetite disturbance.  Patient reports that energy and motivation have been good.  Patient denies any difficulty with concentration.  Patient denies any suicidal ideation.  Review of Systems:  Review of Systems  Musculoskeletal: Positive for back pain.  Psychiatric/Behavioral: Positive for behavioral problems. Negative for agitation, confusion, decreased concentration, dysphoric mood, hallucinations, self-injury, sleep disturbance and suicidal ideas. The patient is not nervous/anxious and is not hyperactive.     Medications: I have reviewed the patient's current medications.  Current Outpatient Medications  Medication Sig Dispense Refill  . alprazolam (XANAX) 2 MG tablet Take 2 mg by mouth every 8 (eight) hours as needed (for anxiety/sleep).   0  . atenolol (TENORMIN) 25 MG tablet Take 25 mg by mouth daily.     Marland Kitchen atorvastatin (LIPITOR) 40 MG tablet Take 40 mg by mouth daily before breakfast.     . HYDROmorphone (DILAUDID) 8 MG tablet Take 1 tablet (8 mg total) by mouth every 4 (four) hours as needed for moderate pain or  severe pain. 40 tablet 0  . insulin NPH Human (HUMULIN N,NOVOLIN N) 100 UNIT/ML injection Inject 40 Units into the skin 2 (two) times daily.    Marland Kitchen lisinopril (PRINIVIL,ZESTRIL) 5 MG tablet Take 5 mg by mouth daily.  0  . pantoprazole (PROTONIX) 40 MG tablet Take 40 mg by mouth daily before breakfast.   0  . vitamin E 400 UNIT capsule Take 400 Units by mouth daily.    Marland Kitchen doxepin (SINEQUAN) 50 MG capsule Take 1-2 capsules (50-100 mg total) by mouth at bedtime. 60 capsule 0  . mirtazapine (REMERON) 7.5 MG tablet Take 7.5 mg by mouth at bedtime.    . traZODone (DESYREL) 100 MG tablet Take 2 tablets (200 mg total) by mouth at bedtime. 60 tablet 1  . traZODone (DESYREL) 50 MG tablet Take 50 mg by mouth at bedtime as needed for sleep.     No current facility-administered medications for this visit.    Facility-Administered Medications Ordered in Other Visits  Medication Dose Route Frequency Provider Last Rate Last Dose  . 0.9 %  sodium chloride infusion   Intravenous Once Ralene Ok, MD        Medication Side Effects: None  Allergies:  Allergies  Allergen Reactions  . Canagliflozin Itching and Other (See Comments)    Yeast infections  pancreatitis Yeast infections  pancreatitis Yeast infections  . Cyclobenzaprine Anaphylaxis and Other (See Comments)    REACTION:  Not compatible with Emsam patch---not taking  this patch any longer   . Gabapentin Other (See Comments)    Other reaction(s): shortness of breath/tardive dyskinesia  . Tanzeum [Albiglutide] Other (See Comments)    PANCREATITIS  . Hydrocodone-Acetaminophen Hives    REACTION: pt turns red and has hot flashes   . Metformin And Related Diarrhea  . Nsaids Diarrhea and Other (See Comments)    Other reaction(s): stomach upset Stomach Upset  Other reaction(s): stomach upset Stomach Upset Stomach Upset  . Vicodin [Hydrocodone-Acetaminophen] Hives and Other (See Comments)    Other reaction(s): hives/itching (although he is  able to tolerate Percocet) REACTION: pt turns red and has hot flashes     Past Medical History:  Diagnosis Date  . Anxiety    takes Xanax daily as needed  . Blood dyscrasia    thrombocytopenia   . Blood transfusion    platelets  . C. difficile colitis 03/2015  . Depression    Emsam patch daily  . Diabetes mellitus     Type II   . GERD (gastroesophageal reflux disease)    takes Protonix daily  . Hepatitis    hepatitis b/ newly diagnosed with portal hypertension  . Hepatitis B virus infection 03/2007  . History of kidney stones    passed- x 2  . History of shingles   . Hyperlipidemia    takes AtorvaStatin daily  . Hypertension    takes Atenolol and Lisinopril daily  . Obstructive sleep apnea (adult) (pediatric)    mild with AHI 11.61/hr now on CPAP at 15cm h2o, CPAP is used q night   . Osteoarthritis   . Pancreatitis   . Sciatica    lumbar region - 2010, also has had numerous injections   . Splenomegaly    LOV Dr Lamonte Sakai  12/12 on chart  . Thrombocytopenia ([place])     Family History  Problem Relation Age of Onset  . Cancer Mother     Social History   Socioeconomic History  . Marital status: Married    Spouse name: Not on file  . Number of children: Not on file  . Years of education: Not on file  . Highest education level: Not on file  Occupational History  . Not on file  Social Needs  . Financial resource strain: Not on file  . Food insecurity:    Worry: Not on file    Inability: Not on file  . Transportation needs:    Medical: Not on file    Non-medical: Not on file  Tobacco Use  . Smoking status: Former Smoker    Packs/day: 1.00    Years: 15.00    Pack years: 15.00    Last attempt to quit: 12/31/1985    Years since quitting: 32.0  . Smokeless tobacco: Never Used  Substance and Sexual Activity  . Alcohol use: No  . Drug use: No  . Sexual activity: Not on file  Lifestyle  . Physical activity:    Days per week: Not on file    Minutes per session:  Not on file  . Stress: Not on file  Relationships  . Social connections:    Talks on phone: Not on file    Gets together: Not on file    Attends religious service: Not on file    Active member of club or organization: Not on file    Attends meetings of clubs or organizations: Not on file    Relationship status: Not on file  . Intimate partner violence:  Fear of current or ex partner: Not on file    Emotionally abused: Not on file    Physically abused: Not on file    Forced sexual activity: Not on file  Other Topics Concern  . Not on file  Social History Narrative  . Not on file    Past Medical History, Surgical history, Social history, and Family history were reviewed and updated as appropriate.   Please see review of systems for further details on the patient's review from today.   Objective:   Physical Exam:  There were no vitals taken for this visit.  Physical Exam Constitutional:      General: He is not in acute distress.    Appearance: He is well-developed. He is obese.  Musculoskeletal:        General: No deformity.  Neurological:     Mental Status: He is alert and oriented to person, place, and time.     Motor: No tremor.     Coordination: Coordination normal.     Gait: Gait abnormal.     Comments: Uses cane  Psychiatric:        Attention and Perception: Attention and perception normal.        Mood and Affect: Mood is not anxious or depressed. Affect is not labile, blunt, angry or inappropriate.        Speech: Speech normal.        Behavior: Behavior normal.        Thought Content: Thought content normal. Thought content does not include homicidal or suicidal ideation. Thought content does not include homicidal or suicidal plan.        Cognition and Memory: Cognition normal.        Judgment: Judgment normal.     Comments: Insight intact. No auditory or visual hallucinations. No delusions.      Lab Review:     Component Value Date/Time   NA 136  11/30/2016 1030   K 3.9 11/30/2016 1030   CL 105 11/30/2016 1030   CO2 23 11/30/2016 1030   GLUCOSE 189 (H) 11/30/2016 1030   BUN 9 11/30/2016 1030   CREATININE 0.83 11/30/2016 1030   CALCIUM 9.3 11/30/2016 1030   PROT 6.5 11/30/2016 1030   ALBUMIN 3.5 11/30/2016 1030   AST 27 11/30/2016 1030   ALT 23 11/30/2016 1030   ALKPHOS 68 11/30/2016 1030   BILITOT 1.5 (H) 11/30/2016 1030   GFRNONAA >60 11/30/2016 1030   GFRAA >60 11/30/2016 1030       Component Value Date/Time   WBC 4.4 12/01/2016 0636   RBC 4.40 12/01/2016 0636   HGB 12.7 (L) 12/01/2016 0636   HGB 13.7 01/22/2011 1348   HCT 37.6 (L) 12/01/2016 0636   HCT 40.1 01/22/2011 1348   PLT 57 (L) 12/01/2016 0636   PLT 59 (L) 01/22/2011 1348   MCV 85.5 12/01/2016 0636   MCV 87.9 01/22/2011 1348   MCH 28.9 12/01/2016 0636   MCHC 33.8 12/01/2016 0636   RDW 14.4 12/01/2016 0636   RDW 13.9 01/22/2011 1348   LYMPHSABS 1.2 04/22/2015 1423   LYMPHSABS 1.1 01/22/2011 1348   MONOABS 0.3 04/22/2015 1423   MONOABS 0.3 01/22/2011 1348   EOSABS 0.1 04/22/2015 1423   EOSABS 0.1 01/22/2011 1348   BASOSABS 0.0 04/22/2015 1423   BASOSABS 0.0 01/22/2011 1348    No results found for: POCLITH, LITHIUM   No results found for: PHENYTOIN, PHENOBARB, VALPROATE, CBMZ   .res Assessment: Plan:    Insomnia due  to mental condition  Generalized anxiety disorder  Panic disorder with agoraphobia  Recurrent major depression in partial remission ([place])   No caffeine after 3 pm.  Sleep hygiene.  First try trazodone 200 mg at night.    If that fails, increase doxepin in it's place to 50 mg capsule 1 or 2 at night.  Do not combine doxepin and trazodone as  that would not be safe.  We discussed the short-term risks associated with benzodiazepines including sedation and increased fall risk among others.  Discussed long-term side effect risk including dependence, potential withdrawal symptoms, and the potential eventual dose-related risk of  dementia.  FU 6 mos  Lynder Parents, MD, DFAPA    Please see After Visit Summary for patient specific instructions.  No future appointments.  No orders of the defined types were placed in this encounter.     -------------------------------

## 2018-01-28 NOTE — Patient Instructions (Addendum)
No caffeine after 3 pm.  First try trazodone 200 mg at night.    If that fails, increase doxepin in it's place to 50 mg capsule 1 or 2 at night.  Do not combine doxepin and trazodone as  that would not be safe.

## 2018-01-29 ENCOUNTER — Other Ambulatory Visit: Payer: Self-pay | Admitting: Psychiatry

## 2018-01-30 ENCOUNTER — Other Ambulatory Visit: Payer: Self-pay

## 2018-01-30 DIAGNOSIS — M961 Postlaminectomy syndrome, not elsewhere classified: Secondary | ICD-10-CM | POA: Diagnosis not present

## 2018-01-30 DIAGNOSIS — G894 Chronic pain syndrome: Secondary | ICD-10-CM | POA: Diagnosis not present

## 2018-01-30 DIAGNOSIS — Z79891 Long term (current) use of opiate analgesic: Secondary | ICD-10-CM | POA: Diagnosis not present

## 2018-01-30 MED ORDER — DOXEPIN HCL 50 MG PO CAPS: 50.0000 mg | ORAL_CAPSULE | Freq: Every day | ORAL | 0 refills | Status: DC

## 2018-02-17 DIAGNOSIS — E1140 Type 2 diabetes mellitus with diabetic neuropathy, unspecified: Secondary | ICD-10-CM | POA: Diagnosis not present

## 2018-02-17 DIAGNOSIS — Z794 Long term (current) use of insulin: Secondary | ICD-10-CM | POA: Diagnosis not present

## 2018-02-17 DIAGNOSIS — E1159 Type 2 diabetes mellitus with other circulatory complications: Secondary | ICD-10-CM | POA: Diagnosis not present

## 2018-02-17 DIAGNOSIS — I10 Essential (primary) hypertension: Secondary | ICD-10-CM | POA: Diagnosis not present

## 2018-02-17 DIAGNOSIS — E1169 Type 2 diabetes mellitus with other specified complication: Secondary | ICD-10-CM | POA: Diagnosis not present

## 2018-02-17 DIAGNOSIS — E1165 Type 2 diabetes mellitus with hyperglycemia: Secondary | ICD-10-CM | POA: Diagnosis not present

## 2018-02-17 DIAGNOSIS — E785 Hyperlipidemia, unspecified: Secondary | ICD-10-CM | POA: Diagnosis not present

## 2018-02-19 ENCOUNTER — Other Ambulatory Visit: Payer: Self-pay | Admitting: Psychiatry

## 2018-02-20 ENCOUNTER — Other Ambulatory Visit: Payer: Self-pay | Admitting: Psychiatry

## 2018-02-20 MED ORDER — TRAZODONE HCL 100 MG PO TABS: 200.0000 mg | ORAL_TABLET | Freq: Every day | ORAL | 0 refills | Status: DC

## 2018-02-20 NOTE — Progress Notes (Signed)
Trazodone refill sent again.

## 2018-02-28 ENCOUNTER — Other Ambulatory Visit: Payer: Self-pay | Admitting: Psychiatry

## 2018-02-28 NOTE — Telephone Encounter (Signed)
Pt left v-mail. Pharm advised he needs Xanax Rx no more refills.     [phone] pt #

## 2018-03-03 ENCOUNTER — Ambulatory Visit (INDEPENDENT_AMBULATORY_CARE_PROVIDER_SITE_OTHER): Payer: Medicare Other | Admitting: Cardiology

## 2018-03-03 ENCOUNTER — Encounter: Payer: Self-pay | Admitting: Cardiology

## 2018-03-03 VITALS — BP 130/68 | HR 68 | Ht 72.0 in | Wt 276.8 lb

## 2018-03-03 DIAGNOSIS — D696 Thrombocytopenia, unspecified: Secondary | ICD-10-CM | POA: Diagnosis not present

## 2018-03-03 DIAGNOSIS — M545 Low back pain: Secondary | ICD-10-CM | POA: Diagnosis not present

## 2018-03-03 DIAGNOSIS — G4700 Insomnia, unspecified: Secondary | ICD-10-CM | POA: Diagnosis not present

## 2018-03-03 DIAGNOSIS — I10 Essential (primary) hypertension: Secondary | ICD-10-CM | POA: Diagnosis not present

## 2018-03-03 DIAGNOSIS — L989 Disorder of the skin and subcutaneous tissue, unspecified: Secondary | ICD-10-CM | POA: Diagnosis not present

## 2018-03-03 DIAGNOSIS — G4733 Obstructive sleep apnea (adult) (pediatric): Secondary | ICD-10-CM | POA: Diagnosis not present

## 2018-03-03 DIAGNOSIS — K219 Gastro-esophageal reflux disease without esophagitis: Secondary | ICD-10-CM | POA: Diagnosis not present

## 2018-03-03 DIAGNOSIS — F411 Generalized anxiety disorder: Secondary | ICD-10-CM | POA: Diagnosis not present

## 2018-03-03 DIAGNOSIS — E1169 Type 2 diabetes mellitus with other specified complication: Secondary | ICD-10-CM | POA: Diagnosis not present

## 2018-03-03 DIAGNOSIS — E782 Mixed hyperlipidemia: Secondary | ICD-10-CM | POA: Diagnosis not present

## 2018-03-03 NOTE — Patient Instructions (Signed)
Medication Instructions:  Your physician recommends that you continue on your current medications as directed. Please refer to the Current Medication list given to you today.  If you need a refill on your cardiac medications before your next appointment, please call your pharmacy.   Lab work: None If you have labs (blood work) drawn today and your tests are completely normal, you will receive your results only by: . MyChart Message (if you have MyChart) OR . A paper copy in the mail If you have any lab test that is abnormal or we need to change your treatment, we will call you to review the results.  Testing/Procedures: None  Follow-Up: At [facility], you and your health needs are our priority.  As part of our continuing mission to provide you with exceptional heart care, we have created designated Provider Care Teams.  These Care Teams include your primary Cardiologist (physician) and Advanced Practice Providers (APPs -  Physician Assistants and Nurse Practitioners) who all work together to provide you with the care you need, when you need it. You will need a follow up appointment in 1 years.  Please call our office 2 months in advance to schedule this appointment.  You may see Dr. Turner or one of the following Advanced Practice Providers on your designated Care Team:     

## 2018-03-03 NOTE — Progress Notes (Signed)
Cardiology Office Note:    Date:  03/03/2018   ID:  Victor [name], DOB 1960/10/05, MRN [id number]  PCP:  Antony Contras, MD  Cardiologist:  No primary care provider on file.    Referring MD: Antony Contras, MD   Chief Complaint  Patient presents with  . Sleep Apnea  . Hypertension    History of Present Illness:    Victor [name] is a 58 y.o. male with a hx of OSA, obesity and HTN. He has a history of mild OSA with an AHI of 11.61/hr and now on CPAP at 15cm H2O.  At his last OV with me he was having problems with his PAP device which was old and he wanted a new one.  He is now here for followup per insurance requirements after receiving new device.  He is doing well with his CPAP device and thinks that he has gotten used to it.  He tolerates the mask and feels the pressure is adequate.  Since going on CPAP he feels rested in the am and has no significant daytime sleepiness.  He denies any significant mouth or nasal dryness or nasal congestion.  He does not think that he snores.     Past Medical History:  Diagnosis Date  . Anxiety    takes Xanax daily as needed  . Blood dyscrasia    thrombocytopenia   . Blood transfusion    platelets  . C. difficile colitis 03/2015  . Depression    Emsam patch daily  . Diabetes mellitus     Type II   . GERD (gastroesophageal reflux disease)    takes Protonix daily  . Hepatitis    hepatitis b/ newly diagnosed with portal hypertension  . Hepatitis B virus infection 03/2007  . History of kidney stones    passed- x 2  . History of shingles   . Hyperlipidemia    takes AtorvaStatin daily  . Hypertension    takes Atenolol and Lisinopril daily  . Obstructive sleep apnea (adult) (pediatric)    mild with AHI 11.61/hr now on CPAP at 15cm h2o, CPAP is used q night   . Osteoarthritis   . Pancreatitis   . Sciatica    lumbar region - 2010, also has had numerous injections   . Splenomegaly    LOV Dr Lamonte Sakai  12/12 on chart  . Thrombocytopenia ([place])      Past Surgical History:  Procedure Laterality Date  . CHOLECYSTECTOMY N/A 04/26/2015   Procedure: LAPAROSCOPIC CHOLECYSTECTOMY;  Surgeon: Ralene Ok, MD;  Location: [facility];  Service: General;  Laterality: N/A;  . COLONOSCOPY W/ POLYPECTOMY    . HAND SURGERY Right    right hand x 2- injured  . KNEE SURGERY     5 surgeries to right knee, 3 surgeries to left knee  . KYPHOPLASTY    . LUMBAR LAMINECTOMY/DECOMPRESSION MICRODISCECTOMY  02/19/2011   Procedure: LUMBAR LAMINECTOMY/DECOMPRESSION MICRODISCECTOMY;  Surgeon: Johnn Hai;  Location: [facility];  Service: Orthopedics;  Laterality: Left;  decompression l5-s1 l4-5 on left  . SHOULDER ARTHROSCOPY WITH SUBACROMIAL DECOMPRESSION Right 11/30/2016   Procedure: Right shoulder arthroscopy, extensive debridement and subacromial decompression;  Surgeon: Nicholes Stairs, MD;  Location: [facility];  Service: Orthopedics;  Laterality: Right;  80  . SHOULDER SURGERY Left    left shoulder, Rotar Cuff  . SPINAL CORD STIMULATOR BATTERY EXCHANGE N/A 03/14/2016   Procedure: Revision of spinal cord stimulator battery;  Surgeon: Melina Schools, MD;  Location: [facility];  Service: Orthopedics;  Laterality: N/A;  Requests 1 hour  . SPINAL CORD STIMULATOR INSERTION N/A 02/02/2015   Procedure: LUMBAR SPINAL CORD STIMULATOR INSERTION;  Surgeon: Melina Schools, MD;  Location: [facility];  Service: Orthopedics;  Laterality: N/A;    Current Medications: Current Meds  Medication Sig  . alprazolam (XANAX) 2 MG tablet TAKE 1 TABLET BY MOUTH 3 TIMES A DAY  . atenolol (TENORMIN) 25 MG tablet Take 25 mg by mouth daily.   Marland Kitchen atorvastatin (LIPITOR) 40 MG tablet Take 40 mg by mouth daily before breakfast.   . HYDROmorphone (DILAUDID) 8 MG tablet Take 1 tablet (8 mg total) by mouth every 4 (four) hours as needed for moderate pain or severe pain.  Marland Kitchen insulin NPH Human (HUMULIN N,NOVOLIN N) 100 UNIT/ML injection Inject 50 Units into the skin 2 (two) times daily.   Marland Kitchen lisinopril  (PRINIVIL,ZESTRIL) 5 MG tablet Take 5 mg by mouth daily.  . pantoprazole (PROTONIX) 40 MG tablet Take 40 mg by mouth daily before breakfast.   . RELION INSULIN SYRINGE 31G X 15/64" 1 ML MISC USE 1 TWICE DAILY  . tiZANidine (ZANAFLEX) 4 MG tablet Take 4 mg by mouth 3 (three) times daily as needed.  . traZODone (DESYREL) 100 MG tablet Take 2 tablets (200 mg total) by mouth at bedtime.  . vitamin E 400 UNIT capsule Take 400 Units by mouth daily.     Allergies:   Canagliflozin; Cyclobenzaprine; Gabapentin; Tanzeum [albiglutide]; Hydrocodone-acetaminophen; Metformin and related; Nsaids; and Vicodin [hydrocodone-acetaminophen]   Social History   Socioeconomic History  . Marital status: Married    Spouse name: Not on file  . Number of children: Not on file  . Years of education: Not on file  . Highest education level: Not on file  Occupational History  . Not on file  Social Needs  . Financial resource strain: Not on file  . Food insecurity:    Worry: Not on file    Inability: Not on file  . Transportation needs:    Medical: Not on file    Non-medical: Not on file  Tobacco Use  . Smoking status: Former Smoker    Packs/day: 1.00    Years: 15.00    Pack years: 15.00    Last attempt to quit: 12/31/1985    Years since quitting: 32.1  . Smokeless tobacco: Never Used  Substance and Sexual Activity  . Alcohol use: No  . Drug use: No  . Sexual activity: Not on file  Lifestyle  . Physical activity:    Days per week: Not on file    Minutes per session: Not on file  . Stress: Not on file  Relationships  . Social connections:    Talks on phone: Not on file    Gets together: Not on file    Attends religious service: Not on file    Active member of club or organization: Not on file    Attends meetings of clubs or organizations: Not on file    Relationship status: Not on file  Other Topics Concern  . Not on file  Social History Narrative  . Not on file     Family History: The  patient's family history includes Cancer in his mother.  ROS:   Please see the history of present illness.    ROS  All other systems reviewed and negative.   EKGs/Labs/Other Studies Reviewed:    The following studies were reviewed today: PAP download  EKG:  EKG is not ordered today.  Recent Labs: No results found for requested labs within last 8760 hours.   Recent Lipid Panel No results found for: CHOL, TRIG, HDL, CHOLHDL, VLDL, LDLCALC, LDLDIRECT  Physical Exam:    VS:  BP 130/68   Pulse 68   Ht 6' (1.829 m)   Wt 276 lb 12.8 oz (125.6 kg)   SpO2 95%   BMI 37.54 kg/m     Wt Readings from Last 3 Encounters:  03/03/18 276 lb 12.8 oz (125.6 kg)  01/06/18 285 lb 12.8 oz (129.6 kg)  02/28/17 273 lb (123.8 kg)     GEN:  Well nourished, well developed in no acute distress HEENT: Normal NECK: No JVD; No carotid bruits LYMPHATICS: No lymphadenopathy CARDIAC: RRR, no murmurs, rubs, gallops RESPIRATORY:  Clear to auscultation without rales, wheezing or rhonchi  ABDOMEN: Soft, non-tender, non-distended MUSCULOSKELETAL:  No edema; No deformity  SKIN: Warm and dry NEUROLOGIC:  Alert and oriented x 3 PSYCHIATRIC:  Normal affect   ASSESSMENT:    1. Obstructive sleep apnea   2. Essential hypertension   3. Morbid obesity ([place])    PLAN:    In order of problems listed above:  1.  OSA - the patient is tolerating PAP therapy well without any problems. The PAP download was reviewed today and showed an AHI of 0.3/hr on 7 cm H2O with 100% compliance in using more than 4 hours nightly.  The patient has been using and benefiting from PAP use and will continue to benefit from therapy.   2.  HTN - BP is controlled on current meds.  He will continue on atenolol 25 mg daily and lisinopril 5 mg daily.  3.  Obesity - I have encouraged him to get into a routine exercise program and cut back on carbs and portions.      Medication Adjustments/Labs and Tests Ordered: Current  medicines are reviewed at length with the patient today.  Concerns regarding medicines are outlined above.  No orders of the defined types were placed in this encounter.  No orders of the defined types were placed in this encounter.   Signed, Fransico Him, MD  03/03/2018 8:25 AM    [facility]

## 2018-03-17 ENCOUNTER — Other Ambulatory Visit: Payer: Self-pay | Admitting: Gastroenterology

## 2018-03-17 DIAGNOSIS — E1165 Type 2 diabetes mellitus with hyperglycemia: Secondary | ICD-10-CM | POA: Diagnosis not present

## 2018-03-17 DIAGNOSIS — Z86010 Personal history of colonic polyps: Secondary | ICD-10-CM | POA: Diagnosis not present

## 2018-03-17 DIAGNOSIS — K760 Fatty (change of) liver, not elsewhere classified: Secondary | ICD-10-CM | POA: Diagnosis not present

## 2018-03-17 DIAGNOSIS — R1084 Generalized abdominal pain: Secondary | ICD-10-CM

## 2018-03-17 DIAGNOSIS — Z8619 Personal history of other infectious and parasitic diseases: Secondary | ICD-10-CM | POA: Diagnosis not present

## 2018-03-17 DIAGNOSIS — D696 Thrombocytopenia, unspecified: Secondary | ICD-10-CM | POA: Diagnosis not present

## 2018-03-17 DIAGNOSIS — M545 Low back pain: Secondary | ICD-10-CM | POA: Diagnosis not present

## 2018-03-17 DIAGNOSIS — G8929 Other chronic pain: Secondary | ICD-10-CM | POA: Diagnosis not present

## 2018-03-17 DIAGNOSIS — K219 Gastro-esophageal reflux disease without esophagitis: Secondary | ICD-10-CM | POA: Diagnosis not present

## 2018-03-17 DIAGNOSIS — Z794 Long term (current) use of insulin: Secondary | ICD-10-CM | POA: Diagnosis not present

## 2018-03-21 ENCOUNTER — Ambulatory Visit
Admission: RE | Admit: 2018-03-21 | Discharge: 2018-03-21 | Disposition: A | Discharge status: Home / Self Care | Payer: Medicare Other | Source: Ambulatory Visit | Attending: Gastroenterology | Admitting: Gastroenterology

## 2018-03-21 DIAGNOSIS — E1169 Type 2 diabetes mellitus with other specified complication: Secondary | ICD-10-CM | POA: Diagnosis not present

## 2018-03-21 DIAGNOSIS — Z87891 Personal history of nicotine dependence: Secondary | ICD-10-CM | POA: Diagnosis not present

## 2018-03-21 DIAGNOSIS — Z9989 Dependence on other enabling machines and devices: Secondary | ICD-10-CM | POA: Diagnosis not present

## 2018-03-21 DIAGNOSIS — R1084 Generalized abdominal pain: Secondary | ICD-10-CM

## 2018-03-21 DIAGNOSIS — D649 Anemia, unspecified: Secondary | ICD-10-CM | POA: Diagnosis not present

## 2018-03-21 DIAGNOSIS — E785 Hyperlipidemia, unspecified: Secondary | ICD-10-CM | POA: Diagnosis not present

## 2018-03-21 DIAGNOSIS — K7460 Unspecified cirrhosis of liver: Secondary | ICD-10-CM | POA: Diagnosis not present

## 2018-03-21 DIAGNOSIS — D696 Thrombocytopenia, unspecified: Secondary | ICD-10-CM | POA: Diagnosis not present

## 2018-03-21 DIAGNOSIS — I10 Essential (primary) hypertension: Secondary | ICD-10-CM | POA: Diagnosis not present

## 2018-03-21 DIAGNOSIS — E538 Deficiency of other specified B group vitamins: Secondary | ICD-10-CM | POA: Diagnosis not present

## 2018-03-21 DIAGNOSIS — R5383 Other fatigue: Secondary | ICD-10-CM | POA: Diagnosis not present

## 2018-03-21 DIAGNOSIS — G4733 Obstructive sleep apnea (adult) (pediatric): Secondary | ICD-10-CM | POA: Diagnosis not present

## 2018-03-21 DIAGNOSIS — E611 Iron deficiency: Secondary | ICD-10-CM | POA: Diagnosis not present

## 2018-03-21 DIAGNOSIS — D72819 Decreased white blood cell count, unspecified: Secondary | ICD-10-CM | POA: Diagnosis not present

## 2018-03-21 MED ORDER — IOHEXOL 300 MG/ML  SOLN
125.0000 mL | Freq: Once | INTRAMUSCULAR | Status: AC | PRN
  Administered 2018-03-21: 125 mL via INTRAVENOUS

## 2018-04-07 DIAGNOSIS — R161 Splenomegaly, not elsewhere classified: Secondary | ICD-10-CM | POA: Diagnosis not present

## 2018-04-07 DIAGNOSIS — D696 Thrombocytopenia, unspecified: Secondary | ICD-10-CM | POA: Diagnosis not present

## 2018-04-07 DIAGNOSIS — K7460 Unspecified cirrhosis of liver: Secondary | ICD-10-CM | POA: Diagnosis not present

## 2018-04-11 DIAGNOSIS — D696 Thrombocytopenia, unspecified: Secondary | ICD-10-CM | POA: Diagnosis not present

## 2018-04-11 DIAGNOSIS — D649 Anemia, unspecified: Secondary | ICD-10-CM | POA: Diagnosis not present

## 2018-04-11 DIAGNOSIS — E538 Deficiency of other specified B group vitamins: Secondary | ICD-10-CM | POA: Diagnosis not present

## 2018-04-11 DIAGNOSIS — E611 Iron deficiency: Secondary | ICD-10-CM | POA: Diagnosis not present

## 2018-04-11 DIAGNOSIS — D72819 Decreased white blood cell count, unspecified: Secondary | ICD-10-CM | POA: Diagnosis not present

## 2018-04-11 DIAGNOSIS — R5383 Other fatigue: Secondary | ICD-10-CM | POA: Diagnosis not present

## 2018-05-20 ENCOUNTER — Other Ambulatory Visit: Payer: Self-pay | Admitting: Psychiatry

## 2018-05-27 ENCOUNTER — Other Ambulatory Visit: Payer: Self-pay | Admitting: Psychiatry

## 2018-05-28 NOTE — Telephone Encounter (Signed)
Last fill 04/29/2018 Last visit 02/2018 Next visit 07/30/2018

## 2018-07-04 ENCOUNTER — Ambulatory Visit (INDEPENDENT_AMBULATORY_CARE_PROVIDER_SITE_OTHER): Payer: Medicare Other | Admitting: Psychiatry

## 2018-07-04 ENCOUNTER — Telehealth: Payer: Self-pay | Admitting: Psychiatry

## 2018-07-04 ENCOUNTER — Encounter: Payer: Self-pay | Admitting: Psychiatry

## 2018-07-04 DIAGNOSIS — F5105 Insomnia due to other mental disorder: Secondary | ICD-10-CM | POA: Diagnosis not present

## 2018-07-04 DIAGNOSIS — F331 Major depressive disorder, recurrent, moderate: Secondary | ICD-10-CM

## 2018-07-04 DIAGNOSIS — F411 Generalized anxiety disorder: Secondary | ICD-10-CM

## 2018-07-04 DIAGNOSIS — F4001 Agoraphobia with panic disorder: Secondary | ICD-10-CM

## 2018-07-04 MED ORDER — SELEGILINE HCL 5 MG PO TABS: ORAL_TABLET | ORAL | 1 refills | Status: DC

## 2018-07-04 NOTE — Telephone Encounter (Signed)
Patient's spouse Inez Catalina called stated patient is in the bed all the time not eating feeling depressed.  Patient would like to speak with you if at all possible, Inez Catalina can be reached [phone]

## 2018-07-04 NOTE — Progress Notes (Signed)
Victor [name] [id number] Dec 11, 1960 58 y.o.  Virtual Visit via Telephone Note  I connected with pt by telephone and verified that I am speaking with the correct person using two identifiers.   I discussed the limitations, risks, security and privacy concerns of performing an evaluation and management service by telephone and the availability of in person appointments. I also discussed with the patient that there may be a patient responsible charge related to this service. The patient expressed understanding and agreed to proceed.  I discussed the assessment and treatment plan with the patient. The patient was provided an opportunity to ask questions and all were answered. The patient agreed with the plan and demonstrated an understanding of the instructions.   The patient was advised to call back or seek an in-person evaluation if the symptoms worsen or if the condition fails to improve as anticipated.  I provided 25 minutes of non-face-to-face time during this encounter. The call started at 335 and ended at 4:00. The patient was located at home and the provider was located office.  Subjective:   Patient ID:  Victor [name] is a 58 y.o. (DOB 1960/09/13) male.  Chief Complaint:  Chief Complaint  Patient presents with  . Follow-up    Medication Management  . Anxiety    Medication Management  . Depression    Increased   . Sleeping Problem    Anxiety  Patient reports no confusion, decreased concentration, nervous/anxious behavior or suicidal ideas.    Depression         Associated symptoms include fatigue.  Associated symptoms include no decreased concentration and no suicidal ideas.  Past medical history includes anxiety.    Victor [name] presents for an urgent appointment today  His last visit was January 30, 2018.  There were options discussed for his insomnia including doxepin and adjusting on trazodone dosing.  His mood was fairly stable at the time.  Very depressed.  Can't  sleep but sleep all the time.  Poor appetite.  Can't get out DT health.  In bed all day.  Dep was ok until Covid and couldn't do anything.  Low energy.  Gotten way worse. Pt reports that mood is Anxious and Depressed and describes anxiety as Panic Attacks. Anxiety symptoms include: Excessive Worry,. Pt reports has difficulty falling asleep, has interrupted sleep and has frequent nighttime awakenings. Pt reports that appetite is poor. Pt reports that energy is poor and loss of interest or pleasure in usual activities, poor motivation and withdrawn from usual activities. Concentration is poor. Suicidal thoughts:  denied by patient.  No increase in pain meds usually.  Workup for leukemia.      Uses CPAP. Gets tired and drowsy in the day without sleep.  No caffeine after 9 pm.    Past Psychiatric Medication Trials: Trazodone, Wellbutrin, mirtazapine, duloxetine, paroxetine, citalopram, Vivactil, selegiline for several years until 2016, quetiapine 100, Xanax 2 mg at night then to 4 times daily, Sonata, Lunesta, Ambien, Rozerem   Review of Systems:  Review of Systems  Constitutional: Positive for fatigue.  Musculoskeletal: Positive for back pain.  Neurological: Negative for tremors and weakness.  Psychiatric/Behavioral: Positive for behavioral problems and depression. Negative for agitation, confusion, decreased concentration, dysphoric mood, hallucinations, self-injury, sleep disturbance and suicidal ideas. The patient is not nervous/anxious and is not hyperactive.     Medications: I have reviewed the patient's current medications.  Current Outpatient Medications  Medication Sig Dispense Refill  . alprazolam (XANAX) 2 MG tablet TAKE  1 TABLET BY MOUTH THREE TIMES DAILY 90 tablet 2  . atenolol (TENORMIN) 25 MG tablet Take 25 mg by mouth daily.     Marland Kitchen atorvastatin (LIPITOR) 40 MG tablet Take 40 mg by mouth daily before breakfast.     . Cyanocobalamin (VITAMIN B 12 PO) Take 1,000 mcg by mouth daily.     . ferrous sulfate (IRON SUPPLEMENT) 325 (65 FE) MG tablet Take 325 mg by mouth daily with breakfast.    . HYDROmorphone (DILAUDID) 8 MG tablet Take 1 tablet (8 mg total) by mouth every 4 (four) hours as needed for moderate pain or severe pain. 40 tablet 0  . insulin NPH Human (HUMULIN N,NOVOLIN N) 100 UNIT/ML injection Inject 50 Units into the skin 2 (two) times daily.     Marland Kitchen lisinopril (PRINIVIL,ZESTRIL) 5 MG tablet Take 5 mg by mouth daily.  0  . pantoprazole (PROTONIX) 40 MG tablet Take 40 mg by mouth daily before breakfast.   0  . RELION INSULIN SYRINGE 31G X 15/64" 1 ML MISC USE 1 TWICE DAILY    . vitamin E 400 UNIT capsule Take 400 Units by mouth daily.    . selegiline (ELDEPRYL) 5 MG tablet 1/2 tablet twice daily for 3 days then 1 twice daily 60 tablet 1  . traZODone (DESYREL) 100 MG tablet TAKE 2 TABLETS (200 MG TOTAL) BY MOUTH AT BEDTIME. (Patient not taking: Reported on 07/04/2018) 180 tablet 0   No current facility-administered medications for this visit.    Facility-Administered Medications Ordered in Other Visits  Medication Dose Route Frequency Provider Last Rate Last Dose  . 0.9 %  sodium chloride infusion   Intravenous Once Ralene Ok, MD        Medication Side Effects: None  Allergies:  Allergies  Allergen Reactions  . Canagliflozin Itching and Other (See Comments)    Yeast infections  pancreatitis Yeast infections  pancreatitis Yeast infections  . Cyclobenzaprine Anaphylaxis and Other (See Comments)    REACTION:  Not compatible with Emsam patch---not taking this patch any longer   . Gabapentin Other (See Comments)    Other reaction(s): shortness of breath/tardive dyskinesia  . Tanzeum [Albiglutide] Other (See Comments)    PANCREATITIS  . Hydrocodone-Acetaminophen Hives    REACTION: pt turns red and has hot flashes   . Metformin And Related Diarrhea  . Nsaids Diarrhea and Other (See Comments)    Other reaction(s): stomach upset Stomach Upset  Other  reaction(s): stomach upset Stomach Upset Stomach Upset  . Vicodin [Hydrocodone-Acetaminophen] Hives and Other (See Comments)    Other reaction(s): hives/itching (although he is able to tolerate Percocet) REACTION: pt turns red and has hot flashes   . Iohexol Hives    After CT a/p. Gave him Benadryl 25mg  PO for fewer than 10 hives on face/neck.  No swelling or airway issues. Gypsy Lore, RN    Past Medical History:  Diagnosis Date  . Anxiety    takes Xanax daily as needed  . Blood dyscrasia    thrombocytopenia   . Blood transfusion    platelets  . C. difficile colitis 03/2015  . Depression    Emsam patch daily  . Diabetes mellitus     Type II   . GERD (gastroesophageal reflux disease)    takes Protonix daily  . Hepatitis    hepatitis b/ newly diagnosed with portal hypertension  . Hepatitis B virus infection 03/2007  . History of kidney stones    passed- x 2  . History  of shingles   . Hyperlipidemia    takes AtorvaStatin daily  . Hypertension    takes Atenolol and Lisinopril daily  . Obstructive sleep apnea (adult) (pediatric)    mild with AHI 11.61/hr now on CPAP at 15cm h2o, CPAP is used q night   . Osteoarthritis   . Pancreatitis   . Sciatica    lumbar region - 2010, also has had numerous injections   . Splenomegaly    LOV Dr Lamonte Sakai  12/12 on chart  . Thrombocytopenia ([place])     Family History  Problem Relation Age of Onset  . Cancer Mother     Social History   Socioeconomic History  . Marital status: Married    Spouse name: Not on file  . Number of children: Not on file  . Years of education: Not on file  . Highest education level: Not on file  Occupational History  . Not on file  Social Needs  . Financial resource strain: Not on file  . Food insecurity:    Worry: Not on file    Inability: Not on file  . Transportation needs:    Medical: Not on file    Non-medical: Not on file  Tobacco Use  . Smoking status: Former Smoker    Packs/day: 1.00    Years:  15.00    Pack years: 15.00    Last attempt to quit: 12/31/1985    Years since quitting: 32.5  . Smokeless tobacco: Never Used  Substance and Sexual Activity  . Alcohol use: No  . Drug use: No  . Sexual activity: Not on file  Lifestyle  . Physical activity:    Days per week: Not on file    Minutes per session: Not on file  . Stress: Not on file  Relationships  . Social connections:    Talks on phone: Not on file    Gets together: Not on file    Attends religious service: Not on file    Active member of club or organization: Not on file    Attends meetings of clubs or organizations: Not on file    Relationship status: Not on file  . Intimate partner violence:    Fear of current or ex partner: Not on file    Emotionally abused: Not on file    Physically abused: Not on file    Forced sexual activity: Not on file  Other Topics Concern  . Not on file  Social History Narrative  . Not on file    Past Medical History, Surgical history, Social history, and Family history were reviewed and updated as appropriate.   Please see review of systems for further details on the patient's review from today.   Objective:   Physical Exam:  There were no vitals taken for this visit.  Physical Exam Neurological:     Mental Status: He is alert and oriented to person, place, and time.     Cranial Nerves: No dysarthria.  Psychiatric:        Attention and Perception: Attention normal.        Mood and Affect: Mood is anxious and depressed. Affect is not tearful.        Speech: Speech normal. Speech is not rapid and pressured or slurred.        Behavior: Behavior is cooperative.        Thought Content: Thought content normal. Thought content is not paranoid or delusional. Thought content does not include homicidal or suicidal ideation.  Thought content does not include homicidal or suicidal plan.        Cognition and Memory: Cognition and memory normal.     Comments: Speech in a monotone. Fair  insight and judgment     Lab Review:     Component Value Date/Time   NA 136 11/30/2016 1030   K 3.9 11/30/2016 1030   CL 105 11/30/2016 1030   CO2 23 11/30/2016 1030   GLUCOSE 189 (H) 11/30/2016 1030   BUN 9 11/30/2016 1030   CREATININE 0.83 11/30/2016 1030   CALCIUM 9.3 11/30/2016 1030   PROT 6.5 11/30/2016 1030   ALBUMIN 3.5 11/30/2016 1030   AST 27 11/30/2016 1030   ALT 23 11/30/2016 1030   ALKPHOS 68 11/30/2016 1030   BILITOT 1.5 (H) 11/30/2016 1030   GFRNONAA >60 11/30/2016 1030   GFRAA >60 11/30/2016 1030       Component Value Date/Time   WBC 4.4 12/01/2016 0636   RBC 4.40 12/01/2016 0636   HGB 12.7 (L) 12/01/2016 0636   HGB 13.7 01/22/2011 1348   HCT 37.6 (L) 12/01/2016 0636   HCT 40.1 01/22/2011 1348   PLT 57 (L) 12/01/2016 0636   PLT 59 (L) 01/22/2011 1348   MCV 85.5 12/01/2016 0636   MCV 87.9 01/22/2011 1348   MCH 28.9 12/01/2016 0636   MCHC 33.8 12/01/2016 0636   RDW 14.4 12/01/2016 0636   RDW 13.9 01/22/2011 1348   LYMPHSABS 1.2 04/22/2015 1423   LYMPHSABS 1.1 01/22/2011 1348   MONOABS 0.3 04/22/2015 1423   MONOABS 0.3 01/22/2011 1348   EOSABS 0.1 04/22/2015 1423   EOSABS 0.1 01/22/2011 1348   BASOSABS 0.0 04/22/2015 1423   BASOSABS 0.0 01/22/2011 1348    No results found for: POCLITH, LITHIUM   No results found for: PHENYTOIN, PHENOBARB, VALPROATE, CBMZ   .res Assessment: Plan:    Major depressive disorder, recurrent episode, moderate (HCC) - Plan: selegiline (ELDEPRYL) 5 MG tablet  Generalized anxiety disorder  Insomnia due to mental condition  Panic disorder with agoraphobia   Patient with a long history of recurrent major depression panic disorder generalized anxiety disorder and insomnia.  He had a good response to selegiline over a period of number of years but stopped it in 2017 due to expense of the Emsam patch.  His mood is been relatively okay until the Covid virus and the quarantine has cut out all of his activities.  In  addition he has a number of health problems that make him a high risk Covid individual.  This is greatly restricted his activities and his socialization.  Is triggered recurrence of depression.  He is failed multiple other SSRIs and SNRIs.  It does not look like he is taken a try cyclic but rather than experiment with the medicine that he is never taken before it makes more sense to go back with an antidepressant that worked in the past.  We will use the selegiline tablets in place of the patch as it is more affordable.  We will start with selegiline 5 mg half twice daily for 3 days then 1 twice daily.  We discussed side effects in detail including MAO inhibitors restrictions regarding medication interactions.  No caffeine after 3 pm.  Sleep hygiene.  Patient has chronically poor sleep hygiene.  Spending way too much time in bed which is fragmenting his sleep.  This was discussed in detail we will have to stay out of bed during the day otherwise his sleep will not consolidate.  Get enough protein and eat better.    DC the sleep meds as they are not working anyway and there is a little risk of interaction with the selegiline.  We discussed the short-term risks associated with benzodiazepines including sedation and increased fall risk among others.  Discussed long-term side effect risk including dependence, potential withdrawal symptoms, and the potential eventual dose-related risk of dementia.  FU 6 to 8 weeks  Lynder Parents, MD, DFAPA    Please see After Visit Summary for patient specific instructions.  Future Appointments  Date Time Provider [place]  07/30/2018 10:00 AM Cottle, Billey Co., MD [facility] None    No orders of the defined types were placed in this encounter.     -------------------------------

## 2018-07-10 ENCOUNTER — Telehealth: Payer: Self-pay | Admitting: Psychiatry

## 2018-07-10 NOTE — Telephone Encounter (Signed)
Sertraline does not cause yeast infections.  I do not know how to treat yeast infections he will need to get in touch with his primary care doctor.

## 2018-07-10 NOTE — Telephone Encounter (Signed)
Pt called to advise new med he started about 3 days ago caused him to get a yeast infection. Please send meds to pharmacy for this yeast infection. [facility]

## 2018-07-11 DIAGNOSIS — Z794 Long term (current) use of insulin: Secondary | ICD-10-CM | POA: Diagnosis not present

## 2018-07-11 DIAGNOSIS — E1140 Type 2 diabetes mellitus with diabetic neuropathy, unspecified: Secondary | ICD-10-CM | POA: Diagnosis not present

## 2018-07-11 DIAGNOSIS — R21 Rash and other nonspecific skin eruption: Secondary | ICD-10-CM | POA: Diagnosis not present

## 2018-07-11 DIAGNOSIS — E611 Iron deficiency: Secondary | ICD-10-CM | POA: Diagnosis not present

## 2018-07-11 DIAGNOSIS — D72819 Decreased white blood cell count, unspecified: Secondary | ICD-10-CM | POA: Diagnosis not present

## 2018-07-11 DIAGNOSIS — D696 Thrombocytopenia, unspecified: Secondary | ICD-10-CM | POA: Diagnosis not present

## 2018-07-11 DIAGNOSIS — E1165 Type 2 diabetes mellitus with hyperglycemia: Secondary | ICD-10-CM | POA: Diagnosis not present

## 2018-07-11 DIAGNOSIS — E538 Deficiency of other specified B group vitamins: Secondary | ICD-10-CM | POA: Diagnosis not present

## 2018-07-11 NOTE — Telephone Encounter (Signed)
Tried to call back pt but it rang once, made a strange noise then disconnected. Will try back later to give him the message.

## 2018-07-11 NOTE — Telephone Encounter (Signed)
Called and started talking to pt and while having conversation phone went out again.

## 2018-07-14 NOTE — Telephone Encounter (Signed)
Spoke with patient and made him aware that this medication does not cause yeast infections. He said he went to see his PCP and got it taken care of.

## 2018-07-14 NOTE — Telephone Encounter (Signed)
Left pt. A message for pt. To call me back. I will also try again later this afternoon.

## 2018-07-15 DIAGNOSIS — E785 Hyperlipidemia, unspecified: Secondary | ICD-10-CM | POA: Diagnosis not present

## 2018-07-15 DIAGNOSIS — E1165 Type 2 diabetes mellitus with hyperglycemia: Secondary | ICD-10-CM | POA: Diagnosis not present

## 2018-07-15 DIAGNOSIS — E1169 Type 2 diabetes mellitus with other specified complication: Secondary | ICD-10-CM | POA: Diagnosis not present

## 2018-07-15 DIAGNOSIS — Z794 Long term (current) use of insulin: Secondary | ICD-10-CM | POA: Diagnosis not present

## 2018-07-15 DIAGNOSIS — E1140 Type 2 diabetes mellitus with diabetic neuropathy, unspecified: Secondary | ICD-10-CM | POA: Diagnosis not present

## 2018-07-15 DIAGNOSIS — I10 Essential (primary) hypertension: Secondary | ICD-10-CM | POA: Diagnosis not present

## 2018-07-15 DIAGNOSIS — E1159 Type 2 diabetes mellitus with other circulatory complications: Secondary | ICD-10-CM | POA: Diagnosis not present

## 2018-07-21 ENCOUNTER — Telehealth: Payer: Self-pay | Admitting: Psychiatry

## 2018-07-21 NOTE — Telephone Encounter (Signed)
Victor [name] called to report that he is not getting any sleep.  Will you call in something to help?  You prescribe him something a couple of years ago.  It helped with sleep was he still felt drunk when he got up.  It was a little too strong, but if it comes in a smaller dose perhaps that could be tried.  Or whatever else you would recommend.  Appt. 07/30/18.  [facility] to [facility] in [place]

## 2018-07-22 NOTE — Telephone Encounter (Signed)
As of last appt in May 22 he was spending too much time in bed and there's no sleep med that will keep him asleep if he's in bed over 8 hours daily.  He needs to work on sleep hygiene.  Go to bed at the same time nightly. Avoid caffeine after noon.  Get up at the same time daily...16 hours before he wants to go back to sleep.  If that does not work after doing it for 7-10 days, then move up his appt to see me sooner.

## 2018-07-23 NOTE — Telephone Encounter (Signed)
Given pt information and he verbalized understanding and will try to improve sleep hygiene. Instructed him not to nap during the day. Recommend he get melatonin if sx's aren't improving until ov on 06/17.

## 2018-07-26 ENCOUNTER — Other Ambulatory Visit: Payer: Self-pay | Admitting: Psychiatry

## 2018-07-26 DIAGNOSIS — F331 Major depressive disorder, recurrent, moderate: Secondary | ICD-10-CM

## 2018-07-28 DIAGNOSIS — Z79891 Long term (current) use of opiate analgesic: Secondary | ICD-10-CM | POA: Diagnosis not present

## 2018-07-28 DIAGNOSIS — Z5181 Encounter for therapeutic drug level monitoring: Secondary | ICD-10-CM | POA: Diagnosis not present

## 2018-07-28 DIAGNOSIS — Z79899 Other long term (current) drug therapy: Secondary | ICD-10-CM | POA: Diagnosis not present

## 2018-07-30 ENCOUNTER — Ambulatory Visit (INDEPENDENT_AMBULATORY_CARE_PROVIDER_SITE_OTHER): Payer: Medicare Other | Admitting: Psychiatry

## 2018-07-30 ENCOUNTER — Other Ambulatory Visit: Payer: Self-pay

## 2018-07-30 ENCOUNTER — Encounter: Payer: Self-pay | Admitting: Psychiatry

## 2018-07-30 DIAGNOSIS — F5105 Insomnia due to other mental disorder: Secondary | ICD-10-CM | POA: Diagnosis not present

## 2018-07-30 DIAGNOSIS — F4001 Agoraphobia with panic disorder: Secondary | ICD-10-CM | POA: Diagnosis not present

## 2018-07-30 DIAGNOSIS — F331 Major depressive disorder, recurrent, moderate: Secondary | ICD-10-CM

## 2018-07-30 DIAGNOSIS — F411 Generalized anxiety disorder: Secondary | ICD-10-CM

## 2018-07-30 MED ORDER — QUETIAPINE FUMARATE 25 MG PO TABS: 50.0000 mg | ORAL_TABLET | Freq: Every day | ORAL | 1 refills | Status: DC

## 2018-07-30 NOTE — Patient Instructions (Signed)
Move selegiline all to the morning and take only 1 1/2 tablets to help sleep.  If needed use quetiapine for sleep 1 or 2 tablets.

## 2018-07-30 NOTE — Progress Notes (Signed)
[name] [id number] 12/02/1960 58 y.o.  Subjective:   Patient ID:  Victor [name] is a 58 y.o. (DOB [birth year]) male.  Chief Complaint:  No chief complaint on file.   Anxiety Patient reports no confusion, decreased concentration, nervous/anxious behavior or suicidal ideas.    Depression        Associated symptoms include fatigue.  Associated symptoms include no decreased concentration and no suicidal ideas.  Past medical history includes anxiety.    Victor [name] presents for an urgent appointment today  Last visit Jul 04, 2018.  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  He called June 8 complaining that he was not able to get sleep.  As of his appointment May 22 he was spending a lot of time in bed and he was encouraged to avoid that and limit time in bed to 8 hours.  He was also encouraged to try melatonin.  Encouraged to stop caffeine after 3 PM.  He was having caffeine much later.  CC can't sleep and feels tense. Tension feeling happens every other day in the afternoon.  Is less depressed with selegiline.  Taking it in AM and about 1 pm.  Sleep reduced to 3 hours at most.  Gets frustrated.  Melatonin without help.  Was doing fine until Covid.  Is high risk with workup for leukemia with low WBC.  That's working against him.  Pt reports that mood is Anxious and Depressed and both are improved with the selegiline.. Anxiety symptoms include: Excessive Worry,. Pt reports has difficulty falling asleep, has interrupted sleep and awakens early. Pt reports that appetite is decreased. Pt reports that energy is improved and improved. Concentration is improved. Suicidal thoughts:  denied by patient. . Suicidal thoughts:  denied by patient.  No increase in pain meds usually.  Workup for leukemia.     No caffeine.   Uses CPAP. Gets tired and drowsy in the day without sleep.  No caffeine after 9 pm.    Past Psychiatric Medication Trials: Trazodone,  Wellbutrin, mirtazapine, duloxetine, paroxetine, citalopram, Vivactil, selegiline (Emsam) for several years until 2016  and stopped DT cost,  quetiapine 100, Xanax 2 mg at night then to 4 times daily, Sonata, Lunesta, Ambien, Rozerem  Review of Systems:  Review of Systems  Constitutional: Positive for fatigue.  Musculoskeletal: Positive for back pain.  Neurological: Negative for tremors and weakness.  Psychiatric/Behavioral: Positive for behavioral problems and depression. Negative for agitation, confusion, decreased concentration, dysphoric mood, hallucinations, self-injury, sleep disturbance and suicidal ideas. The patient is not nervous/anxious and is not hyperactive.     Medications: I have reviewed the patient's current medications.  Current Outpatient Medications  Medication Sig Dispense Refill  . alprazolam (XANAX) 2 MG tablet TAKE 1 TABLET BY MOUTH THREE TIMES DAILY 90 tablet 2  . atenolol (TENORMIN) 25 MG tablet Take 25 mg by mouth daily.     Marland Kitchen atorvastatin (LIPITOR) 40 MG tablet Take 40 mg by mouth daily before breakfast.     . Cyanocobalamin (VITAMIN B 12 PO) Take 1,000 mcg by mouth daily.    . ferrous sulfate (IRON SUPPLEMENT) 325 (65 FE) MG tablet Take 325 mg by mouth daily with breakfast.    . HYDROmorphone (DILAUDID) 8 MG tablet Take 1 tablet (8 mg total) by mouth every 4 (four) hours as needed for moderate pain or severe pain. 40 tablet 0  . insulin NPH Human (HUMULIN N,NOVOLIN N) 100 UNIT/ML injection Inject 50 Units into  the skin 2 (two) times daily.     Marland Kitchen lisinopril (PRINIVIL,ZESTRIL) 5 MG tablet Take 5 mg by mouth daily.  0  . pantoprazole (PROTONIX) 40 MG tablet Take 40 mg by mouth daily before breakfast.   0  . RELION INSULIN SYRINGE 31G X 15/64" 1 ML MISC USE 1 TWICE DAILY    . selegiline (ELDEPRYL) 5 MG tablet 1/2 TABLET TWICE DAILY FOR 3 DAYS THEN 1 TWICE DAILY 180 tablet 0  . traZODone (DESYREL) 100 MG tablet TAKE 2 TABLETS (200 MG TOTAL) BY MOUTH AT BEDTIME.  (Patient not taking: Reported on 07/04/2018) 180 tablet 0  . vitamin E 400 UNIT capsule Take 400 Units by mouth daily.     No current facility-administered medications for this visit.    Facility-Administered Medications Ordered in Other Visits  Medication Dose Route Frequency Provider Last Rate Last Dose  . 0.9 %  sodium chloride infusion   Intravenous Once Ralene Ok, MD        Medication Side Effects: Insomnia  Allergies:  Allergies  Allergen Reactions  . Canagliflozin Itching and Other (See Comments)    Yeast infections  pancreatitis Yeast infections  pancreatitis Yeast infections  . Cyclobenzaprine Anaphylaxis and Other (See Comments)    REACTION:  Not compatible with Emsam patch---not taking this patch any longer   . Gabapentin Other (See Comments)    Other reaction(s): shortness of breath/tardive dyskinesia  . Tanzeum [Albiglutide] Other (See Comments)    PANCREATITIS  . Hydrocodone-Acetaminophen Hives    REACTION: pt turns red and has hot flashes   . Metformin And Related Diarrhea  . Nsaids Diarrhea and Other (See Comments)    Other reaction(s): stomach upset Stomach Upset  Other reaction(s): stomach upset Stomach Upset Stomach Upset  . Vicodin [Hydrocodone-Acetaminophen] Hives and Other (See Comments)    Other reaction(s): hives/itching (although he is able to tolerate Percocet) REACTION: pt turns red and has hot flashes   . Iohexol Hives    After CT a/p. Gave him Benadryl 25mg  PO for fewer than 10 hives on face/neck.  No swelling or airway issues. Victor Lore, RN    Past Medical History:  Diagnosis Date  . Anxiety    takes Xanax daily as needed  . Blood dyscrasia    thrombocytopenia   . Blood transfusion    platelets  . C. difficile colitis 03/2015  . Depression    Emsam patch daily  . Diabetes mellitus     Type II   . GERD (gastroesophageal reflux disease)    takes Protonix daily  . Hepatitis    hepatitis b/ newly diagnosed with portal  hypertension  . Hepatitis B virus infection 03/2007  . History of kidney stones    passed- x 2  . History of shingles   . Hyperlipidemia    takes AtorvaStatin daily  . Hypertension    takes Atenolol and Lisinopril daily  . Obstructive sleep apnea (adult) (pediatric)    mild with AHI 11.61/hr now on CPAP at 15cm h2o, CPAP is used q night   . Osteoarthritis   . Pancreatitis   . Sciatica    lumbar region - 2010, also has had numerous injections   . Splenomegaly    LOV Dr Lamonte Sakai  12/12 on chart  . Thrombocytopenia ([place])     Family History  Problem Relation Age of Onset  . Cancer Mother     Social History   Socioeconomic History  . Marital status: Married    Spouse  name: Not on file  . Number of children: Not on file  . Years of education: Not on file  . Highest education level: Not on file  Occupational History  . Not on file  Social Needs  . Financial resource strain: Not on file  . Food insecurity    Worry: Not on file    Inability: Not on file  . Transportation needs    Medical: Not on file    Non-medical: Not on file  Tobacco Use  . Smoking status: Former Smoker    Packs/day: 1.00    Years: 15.00    Pack years: 15.00    Quit date: 12/31/1985    Years since quitting: 32.6  . Smokeless tobacco: Never Used  Substance and Sexual Activity  . Alcohol use: No  . Drug use: No  . Sexual activity: Not on file  Lifestyle  . Physical activity    Days per week: Not on file    Minutes per session: Not on file  . Stress: Not on file  Relationships  . Social Herbalist on phone: Not on file    Gets together: Not on file    Attends religious service: Not on file    Active member of club or organization: Not on file    Attends meetings of clubs or organizations: Not on file    Relationship status: Not on file  . Intimate partner violence    Fear of current or ex partner: Not on file    Emotionally abused: Not on file    Physically abused: Not on file     Forced sexual activity: Not on file  Other Topics Concern  . Not on file  Social History Narrative  . Not on file    Past Medical History, Surgical history, Social history, and Family history were reviewed and updated as appropriate.   Please see review of systems for further details on the patient's review from today.   Objective:   Physical Exam:  There were no vitals taken for this visit.  Physical Exam Constitutional:      General: He is not in acute distress.    Appearance: He is well-developed.  Musculoskeletal:        General: No deformity.  Neurological:     Mental Status: He is alert and oriented to person, place, and time.     Coordination: Coordination normal.     Gait: Gait abnormal.  Psychiatric:        Attention and Perception: Attention normal. He is attentive.        Mood and Affect: Mood is anxious and depressed. Affect is not labile, blunt, angry or inappropriate.        Speech: Speech normal. Speech is not rapid and pressured or slurred.        Behavior: Behavior normal.        Thought Content: Thought content normal. Thought content does not include homicidal or suicidal ideation. Thought content does not include homicidal or suicidal plan.        Cognition and Memory: Cognition normal.        Judgment: Judgment normal.     Comments: Insight is fair.  Both anxiety and depression are improved birth there is the last visit but he is having a lot of trouble with sleep because of the selegiline     Lab Review:     Component Value Date/Time   NA 136 11/30/2016 1030   K 3.9  11/30/2016 1030   CL 105 11/30/2016 1030   CO2 23 11/30/2016 1030   GLUCOSE 189 (H) 11/30/2016 1030   BUN 9 11/30/2016 1030   CREATININE 0.83 11/30/2016 1030   CALCIUM 9.3 11/30/2016 1030   PROT 6.5 11/30/2016 1030   ALBUMIN 3.5 11/30/2016 1030   AST 27 11/30/2016 1030   ALT 23 11/30/2016 1030   ALKPHOS 68 11/30/2016 1030   BILITOT 1.5 (H) 11/30/2016 1030   GFRNONAA >60  11/30/2016 1030   GFRAA >60 11/30/2016 1030       Component Value Date/Time   WBC 4.4 12/01/2016 0636   RBC 4.40 12/01/2016 0636   HGB 12.7 (L) 12/01/2016 0636   HGB 13.7 01/22/2011 1348   HCT 37.6 (L) 12/01/2016 0636   HCT 40.1 01/22/2011 1348   PLT 57 (L) 12/01/2016 0636   PLT 59 (L) 01/22/2011 1348   MCV 85.5 12/01/2016 0636   MCV 87.9 01/22/2011 1348   MCH 28.9 12/01/2016 0636   MCHC 33.8 12/01/2016 0636   RDW 14.4 12/01/2016 0636   RDW 13.9 01/22/2011 1348   LYMPHSABS 1.2 04/22/2015 1423   LYMPHSABS 1.1 01/22/2011 1348   MONOABS 0.3 04/22/2015 1423   MONOABS 0.3 01/22/2011 1348   EOSABS 0.1 04/22/2015 1423   EOSABS 0.1 01/22/2011 1348   BASOSABS 0.0 04/22/2015 1423   BASOSABS 0.0 01/22/2011 1348    No results found for: POCLITH, LITHIUM   No results found for: PHENYTOIN, PHENOBARB, VALPROATE, CBMZ   .res Assessment: Plan:    Tamotsu was seen today for follow-up, anxiety, sleeping problem and depression.  Diagnoses and all orders for this visit:  Major depressive disorder, recurrent episode, moderate (HCC)  Generalized anxiety disorder  Insomnia due to mental condition  Panic disorder with agoraphobia  Other orders -     QUEtiapine (SEROQUEL) 25 MG tablet; Take 2 tablets (50 mg total) by mouth at bedtime.   Patient with a long history of recurrent major depression panic disorder generalized anxiety disorder and insomnia.  He had a good response to selegiline over a period of number of years but stopped it in 2017 due to expense of the Emsam patch.  His mood is been relatively okay until the Covid virus and the quarantine has cut out all of his activities.  In addition he has a number of health problems that make him a high risk Covid individual.  This is greatly restricted his activities and his socialization.  Is triggered recurrence of depression.  He is failed multiple other SSRIs and SNRIs.  It does not look like he is taken a try cyclic but rather than  experiment with the medicine that he is never taken before it makes more sense to go back with an antidepressant that worked in the past.  We used the selegiline tablets in place of the patch as it is more affordable.  He started selegiline 5 mg half twice daily for 3 days then 1 twice dail.  We discussed side effects in detail including MAO inhibitors restrictions regarding medication interactions.  Unfortunately he has had significant problems with insomnia which is a known side effect possibility.  However his depression and energy are improved.  Reduce selegiline to 7.5 mg and move it all to the morning DT SE insomnia.  And then start Seroquel retrial at a lower dose bc had hangover with it before.  Few other options bc already on BZ. Seroquel 25 to 50 mg nightly as needed insomnia We cannot add more benzodiazepine  and there are limited options that can be taken with selegiline that he is not already tried.  No caffeine after 3 pm.  Sleep hygiene.  Patient has chronically poor sleep hygiene.  Spending way too much time in bed which is fragmenting his sleep.  This was discussed in detail we will have to stay out of bed during the day otherwise his sleep will not consolidate.  Get enough protein and eat better.    DC the sleep meds as they are not working anyway and there is a little risk of interaction with the selegiline.  We discussed the short-term risks associated with benzodiazepines including sedation and increased fall risk among others.  Discussed long-term side effect risk including dependence, potential withdrawal symptoms, and the potential eventual dose-related risk of dementia.  This appt was 30 mins.  FU 6 to 8 weeks  Lynder Parents, MD, DFAPA    Please see After Visit Summary for patient specific instructions.  No future appointments.  No orders of the defined types were placed in this encounter.     -------------------------------

## 2018-08-12 DIAGNOSIS — J0110 Acute frontal sinusitis, unspecified: Secondary | ICD-10-CM | POA: Diagnosis not present

## 2018-08-19 ENCOUNTER — Other Ambulatory Visit: Payer: Self-pay | Admitting: Psychiatry

## 2018-08-20 ENCOUNTER — Telehealth: Payer: Self-pay | Admitting: Psychiatry

## 2018-08-20 DIAGNOSIS — R05 Cough: Secondary | ICD-10-CM | POA: Diagnosis not present

## 2018-08-20 DIAGNOSIS — Z1159 Encounter for screening for other viral diseases: Secondary | ICD-10-CM | POA: Diagnosis not present

## 2018-08-20 DIAGNOSIS — Z03818 Encounter for observation for suspected exposure to other biological agents ruled out: Secondary | ICD-10-CM | POA: Diagnosis not present

## 2018-08-20 NOTE — Telephone Encounter (Signed)
Patient on new medications having headaches for the past 5 weeks.  Wants to know if he can come off of the medications for a few days.

## 2018-08-20 NOTE — Telephone Encounter (Signed)
Have him check his blood pressure.  If it's up please let us know.

## 2018-08-21 ENCOUNTER — Other Ambulatory Visit: Payer: Self-pay | Admitting: Psychiatry

## 2018-08-22 ENCOUNTER — Other Ambulatory Visit: Payer: Self-pay

## 2018-08-22 ENCOUNTER — Other Ambulatory Visit: Payer: Self-pay | Admitting: Psychiatry

## 2018-08-22 MED ORDER — HYDROXYZINE PAMOATE 25 MG PO CAPS: ORAL_CAPSULE | ORAL | 0 refills | Status: DC

## 2018-08-22 NOTE — Telephone Encounter (Signed)
Pt updated with option and he agrees to try hydroxyzine, will submit to his [facility] in [place]. Instructed to call back with further problems or concerns.

## 2018-08-22 NOTE — Telephone Encounter (Signed)
Sorry.  Teaching laboratory technician froze up.  Poor internet here.  If BP up reduce selegiline to 5 mg in am and call us with BP results.

## 2018-08-22 NOTE — Telephone Encounter (Signed)
Patient is asking for something to sleep.  He has low-dose quetiapine.  He is already on high-dose Xanax and we cannot add any further controlled substances to that.  His options for sleep meds are to take the selegiline or protect perhaps he could have hydroxyzine that he prefers 25 mg 1-2 nightly as needed insomnia.  It is unlikely that Belsomra would work for him and trazodone is not ideal with selegiline.

## 2018-09-01 DIAGNOSIS — E782 Mixed hyperlipidemia: Secondary | ICD-10-CM | POA: Diagnosis not present

## 2018-09-01 DIAGNOSIS — Z0000 Encounter for general adult medical examination without abnormal findings: Secondary | ICD-10-CM | POA: Diagnosis not present

## 2018-09-01 DIAGNOSIS — L918 Other hypertrophic disorders of the skin: Secondary | ICD-10-CM | POA: Diagnosis not present

## 2018-09-01 DIAGNOSIS — I10 Essential (primary) hypertension: Secondary | ICD-10-CM | POA: Diagnosis not present

## 2018-09-01 DIAGNOSIS — M545 Low back pain: Secondary | ICD-10-CM | POA: Diagnosis not present

## 2018-09-01 DIAGNOSIS — G4700 Insomnia, unspecified: Secondary | ICD-10-CM | POA: Diagnosis not present

## 2018-09-01 DIAGNOSIS — C44319 Basal cell carcinoma of skin of other parts of face: Secondary | ICD-10-CM | POA: Diagnosis not present

## 2018-09-01 DIAGNOSIS — G4733 Obstructive sleep apnea (adult) (pediatric): Secondary | ICD-10-CM | POA: Diagnosis not present

## 2018-09-01 DIAGNOSIS — Z1389 Encounter for screening for other disorder: Secondary | ICD-10-CM | POA: Diagnosis not present

## 2018-09-01 DIAGNOSIS — D229 Melanocytic nevi, unspecified: Secondary | ICD-10-CM | POA: Diagnosis not present

## 2018-09-01 DIAGNOSIS — F411 Generalized anxiety disorder: Secondary | ICD-10-CM | POA: Diagnosis not present

## 2018-09-01 DIAGNOSIS — D696 Thrombocytopenia, unspecified: Secondary | ICD-10-CM | POA: Diagnosis not present

## 2018-09-01 DIAGNOSIS — D485 Neoplasm of uncertain behavior of skin: Secondary | ICD-10-CM | POA: Diagnosis not present

## 2018-09-01 DIAGNOSIS — E1169 Type 2 diabetes mellitus with other specified complication: Secondary | ICD-10-CM | POA: Diagnosis not present

## 2018-09-01 DIAGNOSIS — K219 Gastro-esophageal reflux disease without esophagitis: Secondary | ICD-10-CM | POA: Diagnosis not present

## 2018-09-09 DIAGNOSIS — D696 Thrombocytopenia, unspecified: Secondary | ICD-10-CM | POA: Diagnosis not present

## 2018-09-09 DIAGNOSIS — E782 Mixed hyperlipidemia: Secondary | ICD-10-CM | POA: Diagnosis not present

## 2018-09-09 DIAGNOSIS — Z125 Encounter for screening for malignant neoplasm of prostate: Secondary | ICD-10-CM | POA: Diagnosis not present

## 2018-09-11 DIAGNOSIS — C44311 Basal cell carcinoma of skin of nose: Secondary | ICD-10-CM | POA: Diagnosis not present

## 2018-09-15 ENCOUNTER — Other Ambulatory Visit: Payer: Self-pay | Admitting: Psychiatry

## 2018-09-29 ENCOUNTER — Other Ambulatory Visit: Payer: Self-pay

## 2018-09-29 ENCOUNTER — Encounter: Payer: Self-pay | Admitting: Psychiatry

## 2018-09-29 ENCOUNTER — Ambulatory Visit (INDEPENDENT_AMBULATORY_CARE_PROVIDER_SITE_OTHER): Payer: Medicare Other | Admitting: Psychiatry

## 2018-09-29 DIAGNOSIS — F332 Major depressive disorder, recurrent severe without psychotic features: Secondary | ICD-10-CM | POA: Diagnosis not present

## 2018-09-29 DIAGNOSIS — F411 Generalized anxiety disorder: Secondary | ICD-10-CM

## 2018-09-29 DIAGNOSIS — F4001 Agoraphobia with panic disorder: Secondary | ICD-10-CM | POA: Diagnosis not present

## 2018-09-29 DIAGNOSIS — F5105 Insomnia due to other mental disorder: Secondary | ICD-10-CM | POA: Diagnosis not present

## 2018-09-29 MED ORDER — QUETIAPINE FUMARATE 50 MG PO TABS: 50.0000 mg | ORAL_TABLET | Freq: Every day | ORAL | 1 refills | Status: DC

## 2018-09-29 NOTE — Progress Notes (Addendum)
Victor [name] [id number] 05-23-1960 58 y.o.  Subjective:   Patient ID:  Victor [name] is a 58 y.o. (DOB [birth year]) male.  Chief Complaint:  Chief Complaint  Patient presents with  . Follow-up    Medication Management  . Anxiety    Medication Management  . Sleeping Problem  . Depression    Anxiety Patient reports no confusion, decreased concentration, nervous/anxious behavior or suicidal ideas.    Depression        Associated symptoms include fatigue.  Associated symptoms include no decreased concentration and no suicidal ideas.  Past medical history includes anxiety.    Victor [name] presents for an urgent appointment today  At visit Jul 04, 2018.  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  Last visit was in June 2020.  He has had problems with insomnia related to selegiline apparently.  We reduced the dose to 7.5 mg and gave it all in the morning.  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  He had benefit from selegiline 10 mg and then 7.5 mg but SE insomnia.  We also added low-dose quetiapine at night to help with sleep and potentially with depression as well.  Seroquel is best med for sleep he's been on and likes it.  Never taken it before and it's the only med that's worked.  He dropped the selegiline to 5.5 mg but still had SE.  Then reduced to 5 mg and did OK for awhile but then gradually more depression with stressors.  B died last 30 ? Re: Covid or MI.  He felt he needed to increase the selegiline to 7.5 mg and did it 6 weeks ago.  Tolerated it fine this time.  It is helping.  Less anxious.  Has helped the depression until the other things occurred.  Was doing fine until Covid.  Is high risk with workup for leukemia with low WBC.  That's working against him.  Pt reports that mood is Anxious and Depressed and both are improved with the selegiline.. Anxiety symptoms include: Excessive Worry,. Pt reports has  difficulty falling asleep and interrupted sleep and awakens early all results resolved with quetiapine. Pt reports that appetite is decreased. Pt reports that energy is improved and improved. Concentration is improved. Suicidal thoughts:  denied by patient. . Suicidal thoughts:  denied by patient.  No increase in pain meds usually.  Workup for leukemia.     No caffeine.   Uses CPAP except lately because of having nose cancer surgery.  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 Gets tired and drowsy in the day without sleep.  No caffeine after 9 pm.    Past Psychiatric Medication Trials: Trazodone, Wellbutrin, mirtazapine, duloxetine, paroxetine, citalopram, Vivactil, selegiline (Emsam) for several years with good response until 2016  and stopped DT cost,  quetiapine 100, Xanax 2 mg at night then to 4 times daily, Sonata, Lunesta, Ambien, Rozerem  Review of Systems:  Review of Systems  Constitutional: Positive for fatigue.  Musculoskeletal: Positive for back pain.  Neurological: Negative for tremors and weakness.  Psychiatric/Behavioral: Positive for behavioral problems and depression. Negative for agitation, confusion, decreased concentration, dysphoric mood, hallucinations, self-injury, sleep disturbance and suicidal ideas. The patient is not nervous/anxious and is not hyperactive.     Medications: I have reviewed the patient's current medications.  Current Outpatient Medications  Medication Sig Dispense Refill  . alprazolam (XANAX) 2 MG tablet TAKE 1 TABLET BY MOUTH THREE TIMES A DAY 90  tablet 2  . atenolol (TENORMIN) 25 MG tablet Take 25 mg by mouth daily.     Marland Kitchen atorvastatin (LIPITOR) 40 MG tablet Take 40 mg by mouth daily before breakfast.     . Cyanocobalamin (VITAMIN B 12 PO) Take 1,000 mcg by mouth daily.    . ferrous sulfate (IRON SUPPLEMENT) 325 (65 FE) MG tablet Take 325 mg by mouth daily with breakfast.    . HYDROmorphone (DILAUDID) 8 MG tablet Take 1 tablet (8 mg total) by mouth every 4  (four) hours as needed for moderate pain or severe pain. 40 tablet 0  . hydrOXYzine (VISTARIL) 25 MG capsule TAKE 1-2 CAPSULES BY MOUTH AT BEDTIME AS NEEDED FOR INSOMNIA 60 capsule 0  . insulin NPH Human (HUMULIN N,NOVOLIN N) 100 UNIT/ML injection Inject 50 Units into the skin 2 (two) times daily.     Marland Kitchen lisinopril (PRINIVIL,ZESTRIL) 5 MG tablet Take 5 mg by mouth daily.  0  . pantoprazole (PROTONIX) 40 MG tablet Take 40 mg by mouth daily before breakfast.   0  . QUEtiapine (SEROQUEL) 50 MG tablet Take 1 tablet (50 mg total) by mouth at bedtime. 90 tablet 1  . RELION INSULIN SYRINGE 31G X 15/64" 1 ML MISC USE 1 TWICE DAILY    . selegiline (ELDEPRYL) 5 MG tablet 1/2 TABLET TWICE DAILY FOR 3 DAYS THEN 1 TWICE DAILY (Patient taking differently: Take 5 mg by mouth. 1 1/2 tablets each morning) 180 tablet 0  . vitamin E 400 UNIT capsule Take 400 Units by mouth daily.     No current facility-administered medications for this visit.    Facility-Administered Medications Ordered in Other Visits  Medication Dose Route Frequency Provider Last Rate Last Dose  . 0.9 %  sodium chloride infusion   Intravenous Once Ralene Ok, MD        Medication Side Effects: Insomnia  Allergies:  Allergies  Allergen Reactions  . Canagliflozin Itching and Other (See Comments)    Yeast infections  pancreatitis Yeast infections  pancreatitis Yeast infections  . Cyclobenzaprine Anaphylaxis and Other (See Comments)    REACTION:  Not compatible with Emsam patch---not taking this patch any longer   . Gabapentin Other (See Comments)    Other reaction(s): shortness of breath/tardive dyskinesia  . Tanzeum [Albiglutide] Other (See Comments)    PANCREATITIS  . Hydrocodone-Acetaminophen Hives    REACTION: pt turns red and has hot flashes   . Metformin And Related Diarrhea  . Nsaids Diarrhea and Other (See Comments)    Other reaction(s): stomach upset Stomach Upset  Other reaction(s): stomach upset Stomach  Upset Stomach Upset  . Vicodin [Hydrocodone-Acetaminophen] Hives and Other (See Comments)    Other reaction(s): hives/itching (although he is able to tolerate Percocet) REACTION: pt turns red and has hot flashes   . Iohexol Hives    After CT a/p. Gave him Benadryl 25mg  PO for fewer than 10 hives on face/neck.  No swelling or airway issues. Gypsy Lore, RN    Past Medical History:  Diagnosis Date  . Anxiety    takes Xanax daily as needed  . Blood dyscrasia    thrombocytopenia   . Blood transfusion    platelets  . C. difficile colitis 03/2015  . Depression    Emsam patch daily  . Diabetes mellitus     Type II   . GERD (gastroesophageal reflux disease)    takes Protonix daily  . Hepatitis    hepatitis b/ newly diagnosed with portal hypertension  .  Hepatitis B virus infection 03/2007  . History of kidney stones    passed- x 2  . History of shingles   . Hyperlipidemia    takes AtorvaStatin daily  . Hypertension    takes Atenolol and Lisinopril daily  . Obstructive sleep apnea (adult) (pediatric)    mild with AHI 11.61/hr now on CPAP at 15cm h2o, CPAP is used q night   . Osteoarthritis   . Pancreatitis   . Sciatica    lumbar region - 2010, also has had numerous injections   . Splenomegaly    LOV Dr Lamonte Sakai  12/12 on chart  . Thrombocytopenia ([place])     Family History  Problem Relation Age of Onset  . Cancer Mother     Social History   Socioeconomic History  . Marital status: Married    Spouse name: Not on file  . Number of children: Not on file  . Years of education: Not on file  . Highest education level: Not on file  Occupational History  . Not on file  Social Needs  . Financial resource strain: Not on file  . Food insecurity    Worry: Not on file    Inability: Not on file  . Transportation needs    Medical: Not on file    Non-medical: Not on file  Tobacco Use  . Smoking status: Former Smoker    Packs/day: 1.00    Years: 15.00    Pack years: 15.00    Quit  date: 12/31/1985    Years since quitting: 32.7  . Smokeless tobacco: Never Used  Substance and Sexual Activity  . Alcohol use: No  . Drug use: No  . Sexual activity: Not on file  Lifestyle  . Physical activity    Days per week: Not on file    Minutes per session: Not on file  . Stress: Not on file  Relationships  . Social Herbalist on phone: Not on file    Gets together: Not on file    Attends religious service: Not on file    Active member of club or organization: Not on file    Attends meetings of clubs or organizations: Not on file    Relationship status: Not on file  . Intimate partner violence    Fear of current or ex partner: Not on file    Emotionally abused: Not on file    Physically abused: Not on file    Forced sexual activity: Not on file  Other Topics Concern  . Not on file  Social History Narrative  . Not on file    Past Medical History, Surgical history, Social history, and Family history were reviewed and updated as appropriate.   Please see review of systems for further details on the patient's review from today.   Objective:   Physical Exam:  There were no vitals taken for this visit.  Physical Exam Constitutional:      General: He is not in acute distress.    Appearance: He is well-developed.  Musculoskeletal:        General: No deformity.  Neurological:     Mental Status: He is alert and oriented to person, place, and time.     Coordination: Coordination normal.     Gait: Gait abnormal.  Psychiatric:        Attention and Perception: Attention normal. He is attentive.        Mood and Affect: Mood is anxious and depressed. Affect  is not labile, blunt, angry or inappropriate.        Speech: Speech normal. Speech is not rapid and pressured or slurred.        Behavior: Behavior normal.        Thought Content: Thought content normal. Thought content does not include homicidal or suicidal ideation. Thought content does not include  homicidal or suicidal plan.        Cognition and Memory: Cognition normal.        Judgment: Judgment normal.     Comments: Insight is fair.  Both anxiety and depression are improved and overall he's satisfied.     Lab Review:     Component Value Date/Time   NA 136 11/30/2016 1030   K 3.9 11/30/2016 1030   CL 105 11/30/2016 1030   CO2 23 11/30/2016 1030   GLUCOSE 189 (H) 11/30/2016 1030   BUN 9 11/30/2016 1030   CREATININE 0.83 11/30/2016 1030   CALCIUM 9.3 11/30/2016 1030   PROT 6.5 11/30/2016 1030   ALBUMIN 3.5 11/30/2016 1030   AST 27 11/30/2016 1030   ALT 23 11/30/2016 1030   ALKPHOS 68 11/30/2016 1030   BILITOT 1.5 (H) 11/30/2016 1030   GFRNONAA >60 11/30/2016 1030   GFRAA >60 11/30/2016 1030       Component Value Date/Time   WBC 4.4 12/01/2016 0636   RBC 4.40 12/01/2016 0636   HGB 12.7 (L) 12/01/2016 0636   HGB 13.7 01/22/2011 1348   HCT 37.6 (L) 12/01/2016 0636   HCT 40.1 01/22/2011 1348   PLT 57 (L) 12/01/2016 0636   PLT 59 (L) 01/22/2011 1348   MCV 85.5 12/01/2016 0636   MCV 87.9 01/22/2011 1348   MCH 28.9 12/01/2016 0636   MCHC 33.8 12/01/2016 0636   RDW 14.4 12/01/2016 0636   RDW 13.9 01/22/2011 1348   LYMPHSABS 1.2 04/22/2015 1423   LYMPHSABS 1.1 01/22/2011 1348   MONOABS 0.3 04/22/2015 1423   MONOABS 0.3 01/22/2011 1348   EOSABS 0.1 04/22/2015 1423   EOSABS 0.1 01/22/2011 1348   BASOSABS 0.0 04/22/2015 1423   BASOSABS 0.0 01/22/2011 1348    No results found for: POCLITH, LITHIUM   No results found for: PHENYTOIN, PHENOBARB, VALPROATE, CBMZ   .res Assessment: Plan:    Nikitas was seen today for follow-up, anxiety, sleeping problem and depression.  Diagnoses and all orders for this visit:  Severe recurrent major depression without psychotic features (HCC) -     QUEtiapine (SEROQUEL) 50 MG tablet; Take 1 tablet (50 mg total) by mouth at bedtime.  Generalized anxiety disorder  Insomnia due to mental condition -     QUEtiapine (SEROQUEL) 50  MG tablet; Take 1 tablet (50 mg total) by mouth at bedtime.  Panic disorder with agoraphobia   Greater than 50% of face to face time with patient was spent on counseling and coordination of care. We discussed Patient with a long history of recurrent major depression panic disorder generalized anxiety disorder and insomnia.  He had a good response to selegiline over a period of number of years but stopped it in 2017 due to expense of the Emsam patch.  His mood is been relatively okay until the Covid virus and the quarantine has cut out all of his activities.  In addition he has a number of health problems that make him a high risk Covid individual.  This is greatly restricted his activities and his socialization.  Is triggered recurrence of depression.  He is failed multiple other  SSRIs and SNRIs.  It does not look like he is taken a try cyclic but rather than experiment with the medicine that he is never taken before it makes more sense to go back with an antidepressant that worked in the past.    Panic and anxiety symptoms are pretty well controlled at present.  We used the selegiline tablets in place of the patch as it is more affordable.  He started selegiline 5 mg half twice daily for 3 days then 1 twice dail.  We discussed side effects in detail including MAO inhibitors restrictions regarding medication interactions.  Unfortunately he had significant problems with insomnia which is a known side effect possibility and some tension.  These have resolved with time and dosage adjustment and the addition of quetiapine..  However his depression and energy are improved and he's satisfied with meds.  No med changes today.  Continue selegiline 7.5 mg each morning DT SE insomnia.  He had worsening symptoms of depression and anxiety when he reduced the dose to 5 mg daily.  Patient called back after appointment and indicated he had been mistaken about which sleep med work to the best.  He stated that  hydroxyzine 50 mg works well and he is not taking quetiapine.  This is been corrected in the office visit note. Sent in prescription for hydroxyzine 50 mg 1 nightly #90 no refills We cannot add more benzodiazepine and there are limited options that can be taken with selegiline that he is not already tried.  Get enough protein and eat better.    We discussed the short-term risks associated with benzodiazepines including sedation and increased fall risk among others.  Discussed long-term side effect risk including dependence, potential withdrawal symptoms, and the potential eventual dose-related risk of dementia.  Again discussed MAO inhibitor restrictions and especially drug interaction issues.  This appt was 30 mins.  FU 3 months  Lynder Parents, MD, DFAPA    Please see After Visit Summary for patient specific instructions.  Future Appointments  Date Time Provider [place]  12/31/2018 10:00 AM Cottle, Billey Co., MD [facility] None    No orders of the defined types were placed in this encounter.     -------------------------------

## 2018-09-30 ENCOUNTER — Telehealth: Payer: Self-pay | Admitting: Psychiatry

## 2018-09-30 MED ORDER — HYDROXYZINE PAMOATE 50 MG PO CAPS: 50.0000 mg | ORAL_CAPSULE | Freq: Every day | ORAL | 0 refills | Status: DC

## 2018-09-30 NOTE — Telephone Encounter (Signed)
Left pt. A VM to return my call.

## 2018-09-30 NOTE — Telephone Encounter (Signed)
Spoke with pt. And he stated the Vistaril 25 mg (2 at night) was the one that works the best. His insurance will pay for a 90 day supply. Also, he wanted to know if it could be sent in as 50 Mg so he will only have to take one pill at HS. He picked up the Seroquel but is going to return it to the pharmacy due to him not noticing it wasn't the Vistaril. Please advise.

## 2018-09-30 NOTE — Addendum Note (Signed)
Addended by: Reatha Armour on: 09/30/2018 06:52 PM   Modules accepted: Orders

## 2018-09-30 NOTE — Telephone Encounter (Signed)
Sent in prescription for hydroxyzine 50 mg 1 nightly #90 no refills and discontinued quetiapine

## 2018-09-30 NOTE — Telephone Encounter (Signed)
I just saw him yesterday and he told me the opposite.  Please verify which he's taking.  He told me Seroquel was the best sleep med ever.

## 2018-09-30 NOTE — Telephone Encounter (Signed)
Pt called Seroquel was sent for refill. Pt no longer takes Seroquel. Now takes Vistaril for sleep. Please d/c Seroquel and send refill for 90 days of Vistatril to [facility] on file

## 2018-10-08 DIAGNOSIS — E538 Deficiency of other specified B group vitamins: Secondary | ICD-10-CM | POA: Diagnosis not present

## 2018-10-08 DIAGNOSIS — E611 Iron deficiency: Secondary | ICD-10-CM | POA: Diagnosis not present

## 2018-10-08 DIAGNOSIS — D72819 Decreased white blood cell count, unspecified: Secondary | ICD-10-CM | POA: Diagnosis not present

## 2018-10-08 DIAGNOSIS — D696 Thrombocytopenia, unspecified: Secondary | ICD-10-CM | POA: Diagnosis not present

## 2018-10-20 ENCOUNTER — Other Ambulatory Visit: Payer: Self-pay | Admitting: Psychiatry

## 2018-10-20 DIAGNOSIS — F331 Major depressive disorder, recurrent, moderate: Secondary | ICD-10-CM

## 2018-10-28 DIAGNOSIS — E785 Hyperlipidemia, unspecified: Secondary | ICD-10-CM | POA: Diagnosis not present

## 2018-10-28 DIAGNOSIS — E1169 Type 2 diabetes mellitus with other specified complication: Secondary | ICD-10-CM | POA: Diagnosis not present

## 2018-10-28 DIAGNOSIS — Z794 Long term (current) use of insulin: Secondary | ICD-10-CM | POA: Diagnosis not present

## 2018-10-28 DIAGNOSIS — E1140 Type 2 diabetes mellitus with diabetic neuropathy, unspecified: Secondary | ICD-10-CM | POA: Diagnosis not present

## 2018-10-28 DIAGNOSIS — E1165 Type 2 diabetes mellitus with hyperglycemia: Secondary | ICD-10-CM | POA: Diagnosis not present

## 2018-10-28 DIAGNOSIS — E1159 Type 2 diabetes mellitus with other circulatory complications: Secondary | ICD-10-CM | POA: Diagnosis not present

## 2018-10-28 DIAGNOSIS — I10 Essential (primary) hypertension: Secondary | ICD-10-CM | POA: Diagnosis not present

## 2018-11-04 DIAGNOSIS — Z23 Encounter for immunization: Secondary | ICD-10-CM | POA: Diagnosis not present

## 2018-11-13 DIAGNOSIS — L814 Other melanin hyperpigmentation: Secondary | ICD-10-CM | POA: Diagnosis not present

## 2018-11-13 DIAGNOSIS — L821 Other seborrheic keratosis: Secondary | ICD-10-CM | POA: Diagnosis not present

## 2018-11-19 DIAGNOSIS — D72819 Decreased white blood cell count, unspecified: Secondary | ICD-10-CM | POA: Diagnosis not present

## 2018-11-19 DIAGNOSIS — D696 Thrombocytopenia, unspecified: Secondary | ICD-10-CM | POA: Diagnosis not present

## 2018-11-20 ENCOUNTER — Other Ambulatory Visit: Payer: Self-pay | Admitting: Psychiatry

## 2018-11-20 NOTE — Telephone Encounter (Signed)
Next appt 11/18

## 2018-12-01 DIAGNOSIS — D696 Thrombocytopenia, unspecified: Secondary | ICD-10-CM | POA: Diagnosis not present

## 2018-12-01 DIAGNOSIS — D72819 Decreased white blood cell count, unspecified: Secondary | ICD-10-CM | POA: Diagnosis not present

## 2018-12-02 DIAGNOSIS — G894 Chronic pain syndrome: Secondary | ICD-10-CM | POA: Diagnosis not present

## 2018-12-02 DIAGNOSIS — M545 Low back pain: Secondary | ICD-10-CM | POA: Diagnosis not present

## 2018-12-02 DIAGNOSIS — Z79891 Long term (current) use of opiate analgesic: Secondary | ICD-10-CM | POA: Diagnosis not present

## 2018-12-02 DIAGNOSIS — Z79899 Other long term (current) drug therapy: Secondary | ICD-10-CM | POA: Diagnosis not present

## 2018-12-02 DIAGNOSIS — M961 Postlaminectomy syndrome, not elsewhere classified: Secondary | ICD-10-CM | POA: Diagnosis not present

## 2018-12-09 DIAGNOSIS — D696 Thrombocytopenia, unspecified: Secondary | ICD-10-CM | POA: Diagnosis not present

## 2018-12-09 DIAGNOSIS — D72819 Decreased white blood cell count, unspecified: Secondary | ICD-10-CM | POA: Diagnosis not present

## 2018-12-24 ENCOUNTER — Other Ambulatory Visit: Payer: Self-pay | Admitting: Psychiatry

## 2018-12-24 DIAGNOSIS — F5105 Insomnia due to other mental disorder: Secondary | ICD-10-CM

## 2018-12-31 ENCOUNTER — Other Ambulatory Visit: Payer: Self-pay

## 2018-12-31 ENCOUNTER — Encounter: Payer: Self-pay | Admitting: Psychiatry

## 2018-12-31 ENCOUNTER — Ambulatory Visit (INDEPENDENT_AMBULATORY_CARE_PROVIDER_SITE_OTHER): Payer: Medicare Other | Admitting: Psychiatry

## 2018-12-31 DIAGNOSIS — F5105 Insomnia due to other mental disorder: Secondary | ICD-10-CM | POA: Diagnosis not present

## 2018-12-31 DIAGNOSIS — F411 Generalized anxiety disorder: Secondary | ICD-10-CM

## 2018-12-31 DIAGNOSIS — F332 Major depressive disorder, recurrent severe without psychotic features: Secondary | ICD-10-CM

## 2018-12-31 DIAGNOSIS — F4001 Agoraphobia with panic disorder: Secondary | ICD-10-CM | POA: Diagnosis not present

## 2018-12-31 MED ORDER — HYDROXYZINE PAMOATE 50 MG PO CAPS: 100.0000 mg | ORAL_CAPSULE | Freq: Every day | ORAL | 0 refills | Status: DC

## 2018-12-31 NOTE — Progress Notes (Signed)
CORDARREL REGALBUTO [id number] 1960/12/01 58 y.o.  Subjective:   Patient ID:  Victor [name] is a 58 y.o. (DOB 03/28/1960) male.  Chief Complaint:  Chief Complaint  Patient presents with  . Follow-up    Medication Management  . Depression    Medication Management  . Anxiety    Medication Management    Anxiety Patient reports no confusion, decreased concentration, nervous/anxious behavior, palpitations or suicidal ideas.    Depression        Associated symptoms include fatigue.  Associated symptoms include no decreased concentration and no suicidal ideas.  Past medical history includes anxiety.    Victor [name] presents for follow-up of his psychiatric conditions today.  At visit Jul 04, 2018.  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  At  visit June 2020.  He has had problems with insomnia related to selegiline apparently.  We reduced the dose to 7.5 mg and gave it all in the morning.  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  He had benefit from selegiline 10 mg and then 7.5 mg but SE insomnia.  We also added low-dose quetiapine at night to help with sleep and potentially with depression as well.   Last seen August 2020.  He ended up using hydroxyzine instead of quetiapine for sleep.  He was satisfied with his meds overall.  Anxiety and depression were improved but not resolved on selegiline 7.5 mg every morning.  No meds were changed.  Off and on taking selegiline 10 mg am about 4 days weekly and other days 7.5 mg am.  It has clearly helped the depression which is under control.  Xanax helps the anxiety.  Nothing to do and stays at home.  In yard knee gave out and he fell.  8 knee surgeries and multiple back surgeries.  Still issues with sleep and then gets mad he can't go to sleep.  3-4 hours of sleep total.  No caffeine after lunch.  No napping.  Says quetiapine didn't help sleep but is taking hydroxyzine 50 and asks to  increase it.  He dropped the selegiline to 7.5 mg but still had SE.  Then reduced to 5 mg and did OK for awhile but then gradually more depression with stressors.  B died last 25 ? Re: Covid or MI.  He felt he needed to increase the selegiline to 7.5 mg and did it 6 weeks ago.  Tolerated it fine this time.  It is helping.  Less anxious.  Has helped the depression until the other things occurred.  Was doing fine until Covid.  Is high risk with workup for leukemia with low WBC.  That's working against him.  Pt reports that mood is Anxious and Depressed and both are improved with the selegiline.. Anxiety symptoms include: Excessive Worry,. Pt reports has difficulty falling asleep and interrupted sleep and awakens early all results resolved with quetiapine. Pt reports that appetite is decreased. Pt reports that energy is improved and improved. Concentration is improved. Suicidal thoughts:  denied by patient. . Suicidal thoughts:  denied by patient.  No increase in pain meds usually.  Workup for leukemia.     No caffeine.   Uses CPAP except lately because of having nose cancer surgery.  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 Gets tired and drowsy in the day without sleep.  No caffeine after 9 pm.    Past Psychiatric Medication Trials: Trazodone, Wellbutrin, mirtazapine, duloxetine, paroxetine, citalopram, Vivactil, selegiline (  Emsam) for several years with good response until 2016  and stopped DT cost,   quetiapine 100, Xanax 2 mg at night then to 4 times daily, Sonata, Lunesta, Ambien, Rozerem  Review of Systems:  Review of Systems  Constitutional: Positive for fatigue.  Cardiovascular: Negative for palpitations.  Musculoskeletal: Positive for back pain.  Neurological: Negative for tremors and weakness.  Psychiatric/Behavioral: Positive for depression. Negative for agitation, behavioral problems, confusion, decreased concentration, dysphoric mood, hallucinations, self-injury, sleep disturbance and  suicidal ideas. The patient is not nervous/anxious and is not hyperactive.     Medications: I have reviewed the patient's current medications.  Current Outpatient Medications  Medication Sig Dispense Refill  . alprazolam (XANAX) 2 MG tablet TAKE 1 TABLET 3 TIMES DAILY 90 tablet 2  . atenolol (TENORMIN) 25 MG tablet Take 25 mg by mouth daily.     Marland Kitchen atorvastatin (LIPITOR) 40 MG tablet Take 40 mg by mouth daily before breakfast.     . Cyanocobalamin (VITAMIN B 12 PO) Take 1,000 mcg by mouth daily.    . ferrous sulfate (IRON SUPPLEMENT) 325 (65 FE) MG tablet Take 325 mg by mouth daily with breakfast.    . HYDROmorphone (DILAUDID) 8 MG tablet Take 1 tablet (8 mg total) by mouth every 4 (four) hours as needed for moderate pain or severe pain. 40 tablet 0  . hydrOXYzine (VISTARIL) 50 MG capsule Take 2 capsules (100 mg total) by mouth at bedtime. 180 capsule 0  . lisinopril (PRINIVIL,ZESTRIL) 5 MG tablet Take 5 mg by mouth daily.  0  . NOVOLIN 70/30 FLEXPEN (70-30) 100 UNIT/ML PEN SMARTSIG:60 Unit(s) SUB-Q Twice Daily    . pantoprazole (PROTONIX) 40 MG tablet Take 40 mg by mouth daily before breakfast.   0  . RELION INSULIN SYRINGE 31G X 15/64" 1 ML MISC USE 1 TWICE DAILY    . selegiline (ELDEPRYL) 5 MG tablet 1 1/2 tablets each morning (Patient taking differently: 10 mg. ) 135 tablet 1  . vitamin E 400 UNIT capsule Take 400 Units by mouth daily.     No current facility-administered medications for this visit.    Facility-Administered Medications Ordered in Other Visits  Medication Dose Route Frequency Provider Last Rate Last Dose  . 0.9 %  sodium chloride infusion   Intravenous Once Ralene Ok, MD        Medication Side Effects: Insomnia  Allergies:  Allergies  Allergen Reactions  . Canagliflozin Itching and Other (See Comments)    Yeast infections  pancreatitis Yeast infections  pancreatitis Yeast infections  . Cyclobenzaprine Anaphylaxis and Other (See Comments)    REACTION:   Not compatible with Emsam patch---not taking this patch any longer   . Gabapentin Other (See Comments)    Other reaction(s): shortness of breath/tardive dyskinesia  . Tanzeum [Albiglutide] Other (See Comments)    PANCREATITIS  . Hydrocodone-Acetaminophen Hives    REACTION: pt turns red and has hot flashes   . Metformin And Related Diarrhea  . Nsaids Diarrhea and Other (See Comments)    Other reaction(s): stomach upset Stomach Upset  Other reaction(s): stomach upset Stomach Upset Stomach Upset  . Vicodin [Hydrocodone-Acetaminophen] Hives and Other (See Comments)    Other reaction(s): hives/itching (although he is able to tolerate Percocet) REACTION: pt turns red and has hot flashes   . Iohexol Hives    After CT a/p. Gave him Benadryl 25mg  PO for fewer than 10 hives on face/neck.  No swelling or airway issues. Gypsy Lore, RN  Past Medical History:  Diagnosis Date  . Anxiety    takes Xanax daily as needed  . Blood dyscrasia    thrombocytopenia   . Blood transfusion    platelets  . C. difficile colitis 03/2015  . Depression    Emsam patch daily  . Diabetes mellitus     Type II   . GERD (gastroesophageal reflux disease)    takes Protonix daily  . Hepatitis    hepatitis b/ newly diagnosed with portal hypertension  . Hepatitis B virus infection 03/2007  . History of kidney stones    passed- x 2  . History of shingles   . Hyperlipidemia    takes AtorvaStatin daily  . Hypertension    takes Atenolol and Lisinopril daily  . Obstructive sleep apnea (adult) (pediatric)    mild with AHI 11.61/hr now on CPAP at 15cm h2o, CPAP is used q night   . Osteoarthritis   . Pancreatitis   . Sciatica    lumbar region - 2010, also has had numerous injections   . Splenomegaly    LOV Dr Lamonte Sakai  12/12 on chart  . Thrombocytopenia ([place])     Family History  Problem Relation Age of Onset  . Cancer Mother     Social History   Socioeconomic History  . Marital status: Married    Spouse  name: Not on file  . Number of children: Not on file  . Years of education: Not on file  . Highest education level: Not on file  Occupational History  . Not on file  Social Needs  . Financial resource strain: Not on file  . Food insecurity    Worry: Not on file    Inability: Not on file  . Transportation needs    Medical: Not on file    Non-medical: Not on file  Tobacco Use  . Smoking status: Former Smoker    Packs/day: 1.00    Years: 15.00    Pack years: 15.00    Quit date: 12/31/1985    Years since quitting: 33.0  . Smokeless tobacco: Never Used  Substance and Sexual Activity  . Alcohol use: No  . Drug use: No  . Sexual activity: Not on file  Lifestyle  . Physical activity    Days per week: Not on file    Minutes per session: Not on file  . Stress: Not on file  Relationships  . Social Herbalist on phone: Not on file    Gets together: Not on file    Attends religious service: Not on file    Active member of club or organization: Not on file    Attends meetings of clubs or organizations: Not on file    Relationship status: Not on file  . Intimate partner violence    Fear of current or ex partner: Not on file    Emotionally abused: Not on file    Physically abused: Not on file    Forced sexual activity: Not on file  Other Topics Concern  . Not on file  Social History Narrative  . Not on file    Past Medical History, Surgical history, Social history, and Family history were reviewed and updated as appropriate.   Please see review of systems for further details on the patient's review from today.   Objective:   Physical Exam:  There were no vitals taken for this visit.  Physical Exam Constitutional:      General: He is not  in acute distress.    Appearance: He is well-developed.  Musculoskeletal:        General: No deformity.  Neurological:     Mental Status: He is alert and oriented to person, place, and time.     Coordination: Coordination  normal.     Gait: Gait abnormal.  Psychiatric:        Attention and Perception: Attention normal. He is attentive.        Mood and Affect: Mood is anxious and depressed. Affect is not labile, blunt, flat, angry or inappropriate.        Speech: Speech normal. Speech is not rapid and pressured or slurred.        Behavior: Behavior normal. Behavior is not slowed.        Thought Content: Thought content normal. Thought content does not include homicidal or suicidal ideation. Thought content does not include homicidal or suicidal plan.        Cognition and Memory: Cognition normal.        Judgment: Judgment normal.     Comments: Insight is fair.  Both anxiety and depression are improved and overall he's satisfied.     Lab Review:     Component Value Date/Time   NA 136 11/30/2016 1030   K 3.9 11/30/2016 1030   CL 105 11/30/2016 1030   CO2 23 11/30/2016 1030   GLUCOSE 189 (H) 11/30/2016 1030   BUN 9 11/30/2016 1030   CREATININE 0.83 11/30/2016 1030   CALCIUM 9.3 11/30/2016 1030   PROT 6.5 11/30/2016 1030   ALBUMIN 3.5 11/30/2016 1030   AST 27 11/30/2016 1030   ALT 23 11/30/2016 1030   ALKPHOS 68 11/30/2016 1030   BILITOT 1.5 (H) 11/30/2016 1030   GFRNONAA >60 11/30/2016 1030   GFRAA >60 11/30/2016 1030       Component Value Date/Time   WBC 4.4 12/01/2016 0636   RBC 4.40 12/01/2016 0636   HGB 12.7 (L) 12/01/2016 0636   HGB 13.7 01/22/2011 1348   HCT 37.6 (L) 12/01/2016 0636   HCT 40.1 01/22/2011 1348   PLT 57 (L) 12/01/2016 0636   PLT 59 (L) 01/22/2011 1348   MCV 85.5 12/01/2016 0636   MCV 87.9 01/22/2011 1348   MCH 28.9 12/01/2016 0636   MCHC 33.8 12/01/2016 0636   RDW 14.4 12/01/2016 0636   RDW 13.9 01/22/2011 1348   LYMPHSABS 1.2 04/22/2015 1423   LYMPHSABS 1.1 01/22/2011 1348   MONOABS 0.3 04/22/2015 1423   MONOABS 0.3 01/22/2011 1348   EOSABS 0.1 04/22/2015 1423   EOSABS 0.1 01/22/2011 1348   BASOSABS 0.0 04/22/2015 1423   BASOSABS 0.0 01/22/2011 1348    No  results found for: POCLITH, LITHIUM   No results found for: PHENYTOIN, PHENOBARB, VALPROATE, CBMZ   .res Assessment: Plan:    Victor [name] was seen today for follow-up, depression and anxiety.  Diagnoses and all orders for this visit:  Severe recurrent major depression without psychotic features ([place])  Panic disorder with agoraphobia  Generalized anxiety disorder  Insomnia due to mental condition -     hydrOXYzine (VISTARIL) 50 MG capsule; Take 2 capsules (100 mg total) by mouth at bedtime.   Greater than 50% of 30 min face to face time with patient was spent on counseling and coordination of care. We discussed Patient with a long history of recurrent major depression panic disorder generalized anxiety disorder and insomnia.  He had a good response to selegiline over a period of number of years but  stopped it in 2017 due to expense of the Emsam patch.  His mood is been relatively okay until the Covid virus and the quarantine has cut out all of his activities.  In addition he has a number of health problems that make him a high risk Covid individual.  This is greatly restricted his activities and his socialization.  Is triggered recurrence of depression.  He is failed multiple other SSRIs and SNRIs.  It does not look like he is taken a try cyclic but rather than experiment with the medicine that he is never taken before it makes more sense to go back with an antidepressant that worked in the past.    Panic and anxiety symptoms are pretty well controlled at present.  We used the selegiline tablets in place of the patch as it is more affordable.  He started selegiline 5 mg half twice daily for 3 days then 1 twice dail.  We discussed side effects in detail including MAO inhibitors restrictions regarding medication interactions.  Unfortunately he had significant problems with insomnia which is a known side effect possibility and some tension.   However his depression and energy are improved and he's  satisfied with meds. Insomnia is a problem and he's not sure if it's worse on selegiline 10 mg selegiline vs 7.5 mg AM.  Continue selegiline 10 AM if tolerated otherwise take 7.5 mg AM.  Increase hydroxyzine 50 mg to 2 HS for sleep.  Continue selegiline 7.5 mg each morning DT SE insomnia.  He had worsening symptoms of depression and anxiety when he reduced the dose to 5 mg daily.  For sleep consider retrying quetiapine and increasing the dosage. We cannot add more benzodiazepine and there are limited options that can be taken with selegiline that he is not already tried.  Get enough protein and eat better.    We discussed the short-term risks associated with benzodiazepines including sedation and increased fall risk among others.  Discussed long-term side effect risk including dependence, potential withdrawal symptoms, and the potential eventual dose-related risk of dementia.  Again discussed MAO inhibitor restrictions and especially drug interaction issues.  This appt was 30 mins.  FU 3 months  Lynder Parents, MD, DFAPA    Please see After Visit Summary for patient specific instructions.  No future appointments.  No orders of the defined types were placed in this encounter.     -------------------------------

## 2019-01-06 DIAGNOSIS — D72819 Decreased white blood cell count, unspecified: Secondary | ICD-10-CM | POA: Diagnosis not present

## 2019-01-06 DIAGNOSIS — E538 Deficiency of other specified B group vitamins: Secondary | ICD-10-CM | POA: Diagnosis not present

## 2019-01-06 DIAGNOSIS — E611 Iron deficiency: Secondary | ICD-10-CM | POA: Diagnosis not present

## 2019-01-06 DIAGNOSIS — Z79899 Other long term (current) drug therapy: Secondary | ICD-10-CM | POA: Diagnosis not present

## 2019-01-06 DIAGNOSIS — D509 Iron deficiency anemia, unspecified: Secondary | ICD-10-CM | POA: Diagnosis not present

## 2019-01-06 DIAGNOSIS — D696 Thrombocytopenia, unspecified: Secondary | ICD-10-CM | POA: Diagnosis not present

## 2019-01-28 DIAGNOSIS — E1169 Type 2 diabetes mellitus with other specified complication: Secondary | ICD-10-CM | POA: Diagnosis not present

## 2019-01-28 DIAGNOSIS — N521 Erectile dysfunction due to diseases classified elsewhere: Secondary | ICD-10-CM | POA: Diagnosis not present

## 2019-01-28 DIAGNOSIS — E785 Hyperlipidemia, unspecified: Secondary | ICD-10-CM | POA: Diagnosis not present

## 2019-01-28 DIAGNOSIS — Z794 Long term (current) use of insulin: Secondary | ICD-10-CM | POA: Diagnosis not present

## 2019-01-28 DIAGNOSIS — I10 Essential (primary) hypertension: Secondary | ICD-10-CM | POA: Diagnosis not present

## 2019-01-28 DIAGNOSIS — E1159 Type 2 diabetes mellitus with other circulatory complications: Secondary | ICD-10-CM | POA: Diagnosis not present

## 2019-01-28 DIAGNOSIS — E1140 Type 2 diabetes mellitus with diabetic neuropathy, unspecified: Secondary | ICD-10-CM | POA: Diagnosis not present

## 2019-01-28 DIAGNOSIS — Z5181 Encounter for therapeutic drug level monitoring: Secondary | ICD-10-CM | POA: Diagnosis not present

## 2019-01-28 DIAGNOSIS — E1165 Type 2 diabetes mellitus with hyperglycemia: Secondary | ICD-10-CM | POA: Diagnosis not present

## 2019-02-17 ENCOUNTER — Other Ambulatory Visit: Payer: Self-pay | Admitting: Psychiatry

## 2019-02-18 NOTE — Telephone Encounter (Signed)
Victor [name] called to check on the status of Xanax prescription.  Looks to me that the pharmacy should have a refill, but he said they told him he doesn't have any refills so they are waiting on our response to their request for the refill.  I also told him they can't fill it early he needs to look at when he last had it filled and allow 30 days from then to be able to refill.  He does have appt 04/02/19

## 2019-02-26 DIAGNOSIS — Z794 Long term (current) use of insulin: Secondary | ICD-10-CM | POA: Diagnosis not present

## 2019-02-26 DIAGNOSIS — E782 Mixed hyperlipidemia: Secondary | ICD-10-CM | POA: Diagnosis not present

## 2019-02-26 DIAGNOSIS — I10 Essential (primary) hypertension: Secondary | ICD-10-CM | POA: Diagnosis not present

## 2019-02-26 DIAGNOSIS — G4733 Obstructive sleep apnea (adult) (pediatric): Secondary | ICD-10-CM | POA: Diagnosis not present

## 2019-02-26 DIAGNOSIS — G4700 Insomnia, unspecified: Secondary | ICD-10-CM | POA: Diagnosis not present

## 2019-02-26 DIAGNOSIS — F411 Generalized anxiety disorder: Secondary | ICD-10-CM | POA: Diagnosis not present

## 2019-02-26 DIAGNOSIS — M545 Low back pain: Secondary | ICD-10-CM | POA: Diagnosis not present

## 2019-02-26 DIAGNOSIS — E1169 Type 2 diabetes mellitus with other specified complication: Secondary | ICD-10-CM | POA: Diagnosis not present

## 2019-02-26 DIAGNOSIS — D696 Thrombocytopenia, unspecified: Secondary | ICD-10-CM | POA: Diagnosis not present

## 2019-02-26 DIAGNOSIS — K219 Gastro-esophageal reflux disease without esophagitis: Secondary | ICD-10-CM | POA: Diagnosis not present

## 2019-03-20 DIAGNOSIS — N521 Erectile dysfunction due to diseases classified elsewhere: Secondary | ICD-10-CM | POA: Diagnosis not present

## 2019-03-20 DIAGNOSIS — Z794 Long term (current) use of insulin: Secondary | ICD-10-CM | POA: Diagnosis not present

## 2019-03-20 DIAGNOSIS — E1159 Type 2 diabetes mellitus with other circulatory complications: Secondary | ICD-10-CM | POA: Diagnosis not present

## 2019-03-20 DIAGNOSIS — E669 Obesity, unspecified: Secondary | ICD-10-CM | POA: Diagnosis not present

## 2019-03-20 DIAGNOSIS — E1140 Type 2 diabetes mellitus with diabetic neuropathy, unspecified: Secondary | ICD-10-CM | POA: Diagnosis not present

## 2019-03-20 DIAGNOSIS — E1169 Type 2 diabetes mellitus with other specified complication: Secondary | ICD-10-CM | POA: Diagnosis not present

## 2019-03-20 DIAGNOSIS — I10 Essential (primary) hypertension: Secondary | ICD-10-CM | POA: Diagnosis not present

## 2019-03-20 DIAGNOSIS — E785 Hyperlipidemia, unspecified: Secondary | ICD-10-CM | POA: Diagnosis not present

## 2019-03-20 DIAGNOSIS — E1165 Type 2 diabetes mellitus with hyperglycemia: Secondary | ICD-10-CM | POA: Diagnosis not present

## 2019-03-20 DIAGNOSIS — Z5181 Encounter for therapeutic drug level monitoring: Secondary | ICD-10-CM | POA: Diagnosis not present

## 2019-03-31 ENCOUNTER — Telehealth: Payer: Self-pay | Admitting: *Deleted

## 2019-03-31 DIAGNOSIS — H04123 Dry eye syndrome of bilateral lacrimal glands: Secondary | ICD-10-CM | POA: Diagnosis not present

## 2019-03-31 DIAGNOSIS — H25813 Combined forms of age-related cataract, bilateral: Secondary | ICD-10-CM | POA: Diagnosis not present

## 2019-03-31 DIAGNOSIS — E119 Type 2 diabetes mellitus without complications: Secondary | ICD-10-CM | POA: Diagnosis not present

## 2019-03-31 NOTE — Telephone Encounter (Signed)

## 2019-04-01 ENCOUNTER — Encounter: Payer: Self-pay | Admitting: Cardiology

## 2019-04-01 ENCOUNTER — Other Ambulatory Visit: Payer: Self-pay

## 2019-04-01 ENCOUNTER — Telehealth (INDEPENDENT_AMBULATORY_CARE_PROVIDER_SITE_OTHER): Payer: Medicare Other | Admitting: Cardiology

## 2019-04-01 ENCOUNTER — Ambulatory Visit: Payer: Medicare Other | Admitting: Psychiatry

## 2019-04-01 ENCOUNTER — Telehealth: Payer: Self-pay | Admitting: *Deleted

## 2019-04-01 VITALS — BP 142/66 | HR 80 | Ht 72.0 in | Wt 280.0 lb

## 2019-04-01 DIAGNOSIS — I10 Essential (primary) hypertension: Secondary | ICD-10-CM

## 2019-04-01 DIAGNOSIS — E119 Type 2 diabetes mellitus without complications: Secondary | ICD-10-CM

## 2019-04-01 DIAGNOSIS — G4733 Obstructive sleep apnea (adult) (pediatric): Secondary | ICD-10-CM

## 2019-04-01 DIAGNOSIS — G894 Chronic pain syndrome: Secondary | ICD-10-CM | POA: Diagnosis not present

## 2019-04-01 NOTE — Telephone Encounter (Signed)
Order placed for supplies and 1 year f/u made.

## 2019-04-01 NOTE — Progress Notes (Signed)
Virtual Visit via Telephone Note   This visit type was conducted due to national recommendations for restrictions regarding the COVID-19 Pandemic (e.g. social distancing) in an effort to limit this patient's exposure and mitigate transmission in our community.  Due to his co-morbid illnesses, this patient is at least at moderate risk for complications without adequate follow up.  This format is felt to be most appropriate for this patient at this time.  The patient did not have access to video technology/had technical difficulties with video requiring transitioning to audio format only (telephone).  All issues noted in this document were discussed and addressed.  No physical exam could be performed with this format.  Please refer to the patient's chart for his  consent to telehealth for [facility].  Evaluation Performed:  Follow-up visit  This visit type was conducted due to national recommendations for restrictions regarding the COVID-19 Pandemic (e.g. social distancing).  This format is felt to be most appropriate for this patient at this time.  All issues noted in this document were discussed and addressed.  No physical exam was performed (except for noted visual exam findings with Video Visits).  Please refer to the patient's chart (MyChart message for video visits and phone note for telephone visits) for the patient's consent to telehealth for [facility].  Date:  04/01/2019   ID:  Victor [name], DOB 1960-12-27, MRN [id number]  Patient Location:  Home  Provider location:   [place]  PCP:  Antony Contras, MD  Sleep Medicine:  Fransico Him, MD Electrophysiologist:  None   Chief Complaint:  OSA  History of Present Illness:    Victor [name] is a 59 y.o. male who presents via audio/video conferencing for a telehealth visit today.    Victor [name] is a 59 y.o. male with a hx of OSA, obesity and HTN. He has a history of mild OSA with an AHI of 11.61/hr and now on CPAP at 15cm  H2O.  he is doing well with his CPAP device and thinks that he has gotten used to it.  He tolerates the nasal mask and feels the pressure is adequate.  Since going on CPAP he feels rested in the am and has no significant daytime sleepiness.  He denies any significant mouth or nasal dryness or nasal congestion.  He does not think that he snores.    The patient does not have symptoms concerning for COVID-19 infection (fever, chills, cough, or new shortness of breath).    Prior CV studies:   The following studies were reviewed today:  PAP compliance download from [place]  Past Medical History:  Diagnosis Date  . Anxiety    takes Xanax daily as needed  . Blood dyscrasia    thrombocytopenia   . Blood transfusion    platelets  . C. difficile colitis 03/2015  . Depression    Emsam patch daily  . Diabetes mellitus     Type II   . GERD (gastroesophageal reflux disease)    takes Protonix daily  . Hepatitis    hepatitis b/ newly diagnosed with portal hypertension  . Hepatitis B virus infection 03/2007  . History of kidney stones    passed- x 2  . History of shingles   . Hyperlipidemia    takes AtorvaStatin daily  . Hypertension    takes Atenolol and Lisinopril daily  . Obstructive sleep apnea (adult) (pediatric)    mild with AHI 11.61/hr now on CPAP at 15cm h2o, CPAP is  used q night   . Osteoarthritis   . Pancreatitis   . Sciatica    lumbar region - 2010, also has had numerous injections   . Splenomegaly    LOV Dr Lamonte Sakai  12/12 on chart  . Thrombocytopenia ([place])    Past Surgical History:  Procedure Laterality Date  . CHOLECYSTECTOMY N/A 04/26/2015   Procedure: LAPAROSCOPIC CHOLECYSTECTOMY;  Surgeon: Ralene Ok, MD;  Location: [place];  Service: General;  Laterality: N/A;  . COLONOSCOPY W/ POLYPECTOMY    . HAND SURGERY Right    right hand x 2- injured  . KNEE SURGERY     5 surgeries to right knee, 3 surgeries to left knee  . KYPHOPLASTY    . LUMBAR LAMINECTOMY/DECOMPRESSION  MICRODISCECTOMY  02/19/2011   Procedure: LUMBAR LAMINECTOMY/DECOMPRESSION MICRODISCECTOMY;  Surgeon: Johnn Hai;  Location: [facility];  Service: Orthopedics;  Laterality: Left;  decompression l5-s1 l4-5 on left  . SHOULDER ARTHROSCOPY WITH SUBACROMIAL DECOMPRESSION Right 11/30/2016   Procedure: Right shoulder arthroscopy, extensive debridement and subacromial decompression;  Surgeon: Nicholes Stairs, MD;  Location: [place];  Service: Orthopedics;  Laterality: Right;  80  . SHOULDER SURGERY Left    left shoulder, Rotar Cuff  . SPINAL CORD STIMULATOR BATTERY EXCHANGE N/A 03/14/2016   Procedure: Revision of spinal cord stimulator battery;  Surgeon: Melina Schools, MD;  Location: [place];  Service: Orthopedics;  Laterality: N/A;  Requests 1 hour  . SPINAL CORD STIMULATOR INSERTION N/A 02/02/2015   Procedure: LUMBAR SPINAL CORD STIMULATOR INSERTION;  Surgeon: Melina Schools, MD;  Location: [place];  Service: Orthopedics;  Laterality: N/A;     Current Meds  Medication Sig  . alprazolam (XANAX) 2 MG tablet TAKE 1 TABLET BY MOUTH THREE TIMES A DAY  . atenolol (TENORMIN) 25 MG tablet Take 25 mg by mouth daily.   Marland Kitchen atorvastatin (LIPITOR) 40 MG tablet Take 40 mg by mouth daily before breakfast.   . Cyanocobalamin (VITAMIN B 12 PO) Take 1,000 mcg by mouth daily.  . ferrous sulfate (IRON SUPPLEMENT) 325 (65 FE) MG tablet Take 325 mg by mouth daily with breakfast.  . HYDROmorphone (DILAUDID) 8 MG tablet Take 1 tablet (8 mg total) by mouth every 4 (four) hours as needed for moderate pain or severe pain.  . hydrOXYzine (VISTARIL) 50 MG capsule Take 2 capsules (100 mg total) by mouth at bedtime.  Marland Kitchen lisinopril (PRINIVIL,ZESTRIL) 5 MG tablet Take 5 mg by mouth daily.  Marland Kitchen NOVOLIN 70/30 FLEXPEN (70-30) 100 UNIT/ML PEN SMARTSIG:60 Unit(s) SUB-Q Twice Daily  . pantoprazole (PROTONIX) 40 MG tablet Take 40 mg by mouth daily before breakfast.   . RELION INSULIN SYRINGE 31G X 15/64" 1 ML MISC USE 1 TWICE DAILY  .  selegiline (ELDEPRYL) 5 MG tablet 1 1/2 tablets each morning (Patient taking differently: 5 mg. )  . vitamin E 400 UNIT capsule Take 400 Units by mouth daily.     Allergies:   Canagliflozin, Cyclobenzaprine, Gabapentin, Tanzeum [albiglutide], Hydrocodone-acetaminophen, Iohexol, Metformin and related, Nsaids, Other, and Vicodin [hydrocodone-acetaminophen]   Social History   Tobacco Use  . Smoking status: Former Smoker    Packs/day: 1.00    Years: 15.00    Pack years: 15.00    Quit date: 12/31/1985    Years since quitting: 33.2  . Smokeless tobacco: Never Used  Substance Use Topics  . Alcohol use: No  . Drug use: No     Family Hx: The patient's family history includes Cancer in his mother.  ROS:  Please see the history of present illness.     All other systems reviewed and are negative.   Labs/Other Tests and Data Reviewed:    Recent Labs: No results found for requested labs within last 8760 hours.   Recent Lipid Panel No results found for: CHOL, TRIG, HDL, CHOLHDL, LDLCALC, LDLDIRECT  Wt Readings from Last 3 Encounters:  04/01/19 280 lb (127 kg)  03/03/18 276 lb 12.8 oz (125.6 kg)  01/06/18 285 lb 12.8 oz (129.6 kg)     Objective:    Vital Signs:  BP (!) 142/66   Pulse 80   Ht 6' (1.829 m)   Wt 280 lb (127 kg)   BMI 37.97 kg/m    CONSTITUTIONAL:  Well nourished, well developed male in no acute distress.  EYES: anicteric MOUTH: oral mucosa is pink RESPIRATORY: Normal respiratory effort, symmetric expansion CARDIOVASCULAR: No peripheral edema SKIN: No rash, lesions or ulcers MUSCULOSKELETAL: no digital cyanosis NEURO: Cranial Nerves II-XII grossly intact, moves all extremities PSYCH: Intact judgement and insight.  A&O x 3, Mood/affect appropriate   ASSESSMENT & PLAN:    1.  OSA -  The patient is tolerating PAP therapy well without any problems. The PAP download was reviewed today and showed an AHI of 0.2/hr on 7 cm H2O with 100% compliance in using more  than 4 hours nightly.  The patient has been using and benefiting from PAP use and will continue to benefit from therapy.   2.  HTN -BP controlled -continue Atenolol 25mg  and Lisinopril 5mg  daily -outside labs from PCP on [facility] reviewed and showed a Creatinine of 0.80 and K+ 3.8  3.  Obesity -he cannot exercise due to back problems. I have encouraged him to cut back on carbs and portions.   4.  DM2 -followed by PCP -HbA1C reviewed from PCP labs and was 8% a year ago and recently -he is on Insulin and is going to be changed to the insulin pump  COVID-19 Education: The signs and symptoms of COVID-19 were discussed with the patient and how to seek care for testing (follow up with PCP or arrange E-visit).  The importance of social distancing was discussed today.  Patient Risk:   After full review of this patient's clinical status, I feel that they are at least moderate risk at this time.  Time:   Today, I have spent 20 minutes on telemedicine discussing medical problems including OSA, HTN, DM, obesity and reviewing patient's chart including PAP compliance download from [place] and outside labs from PCP on KPN.  Medication Adjustments/Labs and Tests Ordered: Current medicines are reviewed at length with the patient today.  Concerns regarding medicines are outlined above.  Tests Ordered: No orders of the defined types were placed in this encounter.  Medication Changes: No orders of the defined types were placed in this encounter.   Disposition:  Follow up in 1 year(s)  Signed, Fransico Him, MD  04/01/2019 9:15 AM    [place]

## 2019-04-01 NOTE — Telephone Encounter (Signed)
-----   Message from Sueanne Margarita, MD sent at 04/01/2019  9:16 AM EST ----- Please order new PAP supplies.  1 year followup

## 2019-04-02 ENCOUNTER — Ambulatory Visit: Payer: Medicare Other | Admitting: Psychiatry

## 2019-04-27 DIAGNOSIS — D126 Benign neoplasm of colon, unspecified: Secondary | ICD-10-CM | POA: Diagnosis not present

## 2019-04-27 DIAGNOSIS — K7460 Unspecified cirrhosis of liver: Secondary | ICD-10-CM | POA: Diagnosis not present

## 2019-04-27 DIAGNOSIS — I10 Essential (primary) hypertension: Secondary | ICD-10-CM | POA: Diagnosis not present

## 2019-04-27 DIAGNOSIS — E1159 Type 2 diabetes mellitus with other circulatory complications: Secondary | ICD-10-CM | POA: Diagnosis not present

## 2019-04-27 DIAGNOSIS — Z86010 Personal history of colonic polyps: Secondary | ICD-10-CM | POA: Diagnosis not present

## 2019-04-27 DIAGNOSIS — K760 Fatty (change of) liver, not elsewhere classified: Secondary | ICD-10-CM | POA: Diagnosis not present

## 2019-04-27 DIAGNOSIS — D696 Thrombocytopenia, unspecified: Secondary | ICD-10-CM | POA: Diagnosis not present

## 2019-04-28 DIAGNOSIS — M545 Low back pain: Secondary | ICD-10-CM | POA: Diagnosis not present

## 2019-04-28 DIAGNOSIS — M961 Postlaminectomy syndrome, not elsewhere classified: Secondary | ICD-10-CM | POA: Diagnosis not present

## 2019-04-28 DIAGNOSIS — M5136 Other intervertebral disc degeneration, lumbar region: Secondary | ICD-10-CM | POA: Diagnosis not present

## 2019-04-28 DIAGNOSIS — Z79891 Long term (current) use of opiate analgesic: Secondary | ICD-10-CM | POA: Diagnosis not present

## 2019-04-29 ENCOUNTER — Ambulatory Visit (INDEPENDENT_AMBULATORY_CARE_PROVIDER_SITE_OTHER): Payer: Medicare Other | Admitting: Psychiatry

## 2019-04-29 ENCOUNTER — Encounter: Payer: Self-pay | Admitting: Psychiatry

## 2019-04-29 DIAGNOSIS — F5105 Insomnia due to other mental disorder: Secondary | ICD-10-CM

## 2019-04-29 DIAGNOSIS — F411 Generalized anxiety disorder: Secondary | ICD-10-CM | POA: Diagnosis not present

## 2019-04-29 DIAGNOSIS — F4001 Agoraphobia with panic disorder: Secondary | ICD-10-CM | POA: Diagnosis not present

## 2019-04-29 DIAGNOSIS — F3341 Major depressive disorder, recurrent, in partial remission: Secondary | ICD-10-CM | POA: Diagnosis not present

## 2019-04-29 NOTE — Progress Notes (Signed)
Victor [name] [id number] 11-Jan-1961 59 y.o.  Virtual Visit via [facility]  I connected with pt by WebEx and verified that I am speaking with the correct person using two identifiers.   I discussed the limitations, risks, security and privacy concerns of performing an evaluation and management service by Jack[facility]ul[facility] and the availability of in person appointments. I also discussed with the patient that there may be a patient responsible charge related to this service. The patient expressed understanding and agreed to proceed.  I discussed the assessment and treatment plan with the patient. The patient was provided an opportunity to ask questions and all were answered. The patient agreed with the plan and demonstrated an understanding of the instructions.   The patient was advised to call back or seek an in-person evaluation if the symptoms worsen or if the condition fails to improve as anticipated.  I provided 30 minutes of video time during this encounter. The call started at 300 and ended at 3:30. The patient was located at home and the provider was located office.   Subjective:   Patient ID:  Victor [name] is a 59 y.o. (DOB 07-29-1960) male.  Chief Complaint:  Chief Complaint  Patient presents with  . Follow-up    Medication Management  . Depression    Medication Management  . Medication Problem    funny feeling in afternoon    Anxiety Patient reports no confusion, decreased concentration, nervous/anxious behavior, palpitations or suicidal ideas.    Depression        Associated symptoms include fatigue.  Associated symptoms include no decreased concentration and no suicidal ideas.  Past medical history includes anxiety.    Victor [name] presents for follow-up of his psychiatric conditions today.  At visit Jul 04, 2018.  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  At  visit June 2020.  He has had problems with insomnia related to selegiline apparently.   We reduced the dose to 7.5 mg and gave it all in the morning.  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  He had benefit from selegiline 10 mg and then 7.5 mg but SE insomnia.  We also added low-dose quetiapine at night to help with sleep and potentially with depression as well.   seen August 2020.  He ended up using hydroxyzine instead of quetiapine for sleep.  He was satisfied with his meds overall.  Anxiety and depression were improved but not resolved on selegiline 7.5 mg every morning.  No meds were changed. Off and on taking selegiline 10 mg am about 4 days weekly and other days 7.5 mg am.  It has clearly helped the depression which is under control.  Xanax helps the anxiety.  Last seen November 2020.  The following med decisions were made: Increase hydroxyzine 50 mg to 2 HS for sleep. Continue selegiline 7.5 mg each morning DT SE insomnia.  He had worsening symptoms of depression and anxiety when he reduced the dose to 5 mg daily.   B-in-law died at 98 at [facility] with Covid.  Pt having problems with low platelets.  Further workup ongoing.  Sometimess feels funny in afternoon with 7.5 mg selegiline and will intermittently reduce the dose.  Still CO trouble with sleep. Drowsy if still in the afternoon.  Still issues with sleep and then gets mad he can't go to sleep.  3-4 hours of sleep total.  No caffeine after lunch.  No napping.  Says quetiapine didn't help  sleep but is taking hydroxyzine 50 and asks to increase it.  He dropped the selegiline to 7.5 mg but still had SE.  Then reduced to 5 mg and did OK for awhile but then gradually more depression with stressors.   Tolerated it fine this time.  It is helping.  Less anxious.  Has helped the depression until the other things occurred.  Was doing fine until Covid.  Is high risk with workup for leukemia with low WBC.  That's working against him.  Pt reports that mood is Anxious and Depressed and both are improved with the  selegiline.. Anxiety symptoms include: Excessive Worry,.  Worry over health and low platelets.   Pt reports has difficulty falling asleep and interrupted sleep and awakens early. Pt reports that appetite is decreased. Pt reports that energy is improved and improved. Concentration is improved. Suicidal thoughts:  denied by patient. . Suicidal thoughts:  denied by patient.  No increase in pain meds usually.  Workup for leukemia.     No caffeine.   Uses CPAP except lately because of having nose cancer surgery.  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 Gets tired and drowsy in the day without sleep.  No caffeine after 9 pm.    Past Psychiatric Medication Trials: Trazodone, Wellbutrin, mirtazapine, duloxetine, paroxetine, citalopram, Vivactil, selegiline (Emsam) for several years with good response until 2016  and stopped DT cost,   quetiapine 100, Xanax 2 mg at night then to 4 times daily, Sonata, Lunesta, Ambien, Rozerem  Review of Systems:  Review of Systems  Constitutional: Positive for fatigue.  Cardiovascular: Negative for palpitations.  Musculoskeletal: Positive for back pain.  Neurological: Negative for tremors and weakness.  Psychiatric/Behavioral: Positive for depression. Negative for agitation, behavioral problems, confusion, decreased concentration, dysphoric mood, hallucinations, self-injury, sleep disturbance and suicidal ideas. The patient is not nervous/anxious and is not hyperactive.     Medications: I have reviewed the patient's current medications.  Current Outpatient Medications  Medication Sig Dispense Refill  . alprazolam (XANAX) 2 MG tablet TAKE 1 TABLET BY MOUTH THREE TIMES A DAY 90 tablet 1  . atenolol (TENORMIN) 25 MG tablet Take 25 mg by mouth daily.     Marland Kitchen atorvastatin (LIPITOR) 40 MG tablet Take 40 mg by mouth daily before breakfast.     . Cyanocobalamin (VITAMIN B 12 PO) Take 1,000 mcg by mouth daily.    . ferrous sulfate (IRON SUPPLEMENT) 325 (65 FE) MG tablet Take 325 mg  by mouth daily with breakfast.    . HYDROmorphone (DILAUDID) 8 MG tablet Take 1 tablet (8 mg total) by mouth every 4 (four) hours as needed for moderate pain or severe pain. 40 tablet 0  . hydrOXYzine (VISTARIL) 50 MG capsule Take 2 capsules (100 mg total) by mouth at bedtime. 180 capsule 0  . lisinopril (PRINIVIL,ZESTRIL) 5 MG tablet Take 5 mg by mouth daily.  0  . NOVOLIN 70/30 FLEXPEN (70-30) 100 UNIT/ML PEN SMARTSIG:60 Unit(s) SUB-Q Twice Daily    . pantoprazole (PROTONIX) 40 MG tablet Take 40 mg by mouth daily before breakfast.   0  . RELION INSULIN SYRINGE 31G X 15/64" 1 ML MISC USE 1 TWICE DAILY    . selegiline (ELDEPRYL) 5 MG tablet 1 1/2 tablets each morning (Patient taking differently: 7.5 mg. Usually 7.5 but varies with 5 mg) 135 tablet 1  . vitamin E 400 UNIT capsule Take 400 Units by mouth daily.     No current facility-administered medications for this visit.  Facility-Administered Medications Ordered in Other Visits  Medication Dose Route Frequency Provider Last Rate Last Admin  . 0.9 %  sodium chloride infusion   Intravenous Once Ralene Ok, MD        Medication Side Effects: Insomnia  Allergies:  Allergies  Allergen Reactions  . Canagliflozin Itching and Other (See Comments)    Yeast infections  pancreatitis Yeast infections  pancreatitis Yeast infections  . Cyclobenzaprine Anaphylaxis and Other (See Comments)    REACTION:  Not compatible with Emsam patch---not taking this patch any longer   . Gabapentin Other (See Comments)    Other reaction(s): shortness of breath/tardive dyskinesia  . Tanzeum [Albiglutide] Other (See Comments)    PANCREATITIS  . Hydrocodone-Acetaminophen Hives    REACTION: pt turns red and has hot flashes   . Iohexol Hives    After CT a/p. Gave him Benadryl 25mg  PO for fewer than 10 hives on face/neck.  No swelling or airway issues. Gypsy Lore, RN After CT a/p. Gave him Benadryl 25mg  PO for fewer than 10 hives on face/neck.  No swelling  or airway issues. Gypsy Lore, RN  . Metformin And Related Diarrhea  . Nsaids Diarrhea and Other (See Comments)    Other reaction(s): stomach upset Stomach Upset  Other reaction(s): stomach upset Stomach Upset Stomach Upset  . Other Other (See Comments)  . Vicodin [Hydrocodone-Acetaminophen] Hives and Other (See Comments)    Other reaction(s): hives/itching (although he is able to tolerate Percocet) REACTION: pt turns red and has hot flashes     Past Medical History:  Diagnosis Date  . Anxiety    takes Xanax daily as needed  . Blood dyscrasia    thrombocytopenia   . Blood transfusion    platelets  . C. difficile colitis 03/2015  . Depression    Emsam patch daily  . Diabetes mellitus     Type II   . GERD (gastroesophageal reflux disease)    takes Protonix daily  . Hepatitis    hepatitis b/ newly diagnosed with portal hypertension  . Hepatitis B virus infection 03/2007  . History of kidney stones    passed- x 2  . History of shingles   . Hyperlipidemia    takes AtorvaStatin daily  . Hypertension    takes Atenolol and Lisinopril daily  . Obstructive sleep apnea (adult) (pediatric)    mild with AHI 11.61/hr now on CPAP at 15cm h2o, CPAP is used q night   . Osteoarthritis   . Pancreatitis   . Sciatica    lumbar region - 2010, also has had numerous injections   . Splenomegaly    LOV Dr Lamonte Sakai  12/12 on chart  . Thrombocytopenia ([place])     Family History  Problem Relation Age of Onset  . Cancer Mother     Social History   Socioeconomic History  . Marital status: Married    Spouse name: Not on file  . Number of children: Not on file  . Years of education: Not on file  . Highest education level: Not on file  Occupational History  . Not on file  Tobacco Use  . Smoking status: Former Smoker    Packs/day: 1.00    Years: 15.00    Pack years: 15.00    Quit date: 12/31/1985    Years since quitting: 33.3  . Smokeless tobacco: Never Used  Substance and Sexual Activity   . Alcohol use: No  . Drug use: No  . Sexual activity: Not on file  Other Topics Concern  . Not on file  Social History Narrative  . Not on file   Social Determinants of Health   Financial Resource Strain:   . Difficulty of Paying Living Expenses:   Food Insecurity:   . Worried About Charity fundraiser in the Last Year:   . Arboriculturist in the Last Year:   Transportation Needs:   . Film/video editor (Medical):   Marland Kitchen Lack of Transportation (Non-Medical):   Physical Activity:   . Days of Exercise per Week:   . Minutes of Exercise per Session:   Stress:   . Feeling of Stress :   Social Connections:   . Frequency of Communication with Friends and Family:   . Frequency of Social Gatherings with Friends and Family:   . Attends Religious Services:   . Active Member of Clubs or Organizations:   . Attends Archivist Meetings:   Marland Kitchen Marital Status:   Intimate Partner Violence:   . Fear of Current or Ex-Partner:   . Emotionally Abused:   Marland Kitchen Physically Abused:   . Sexually Abused:     Past Medical History, Surgical history, Social history, and Family history were reviewed and updated as appropriate.   Please see review of systems for further details on the patient's review from today.   Objective:   Physical Exam:  There were no vitals taken for this visit.  Physical Exam Constitutional:      General: He is not in acute distress.    Appearance: He is well-developed.  Musculoskeletal:        General: No deformity.  Neurological:     Mental Status: He is alert and oriented to person, place, and time.     Coordination: Coordination normal.     Gait: Gait abnormal.  Psychiatric:        Attention and Perception: Attention normal. He is attentive.        Mood and Affect: Mood is anxious and depressed. Affect is not labile, blunt, flat, angry or inappropriate.        Speech: Speech normal. Speech is not rapid and pressured or slurred.        Behavior: Behavior  normal. Behavior is not slowed.        Thought Content: Thought content normal. Thought content does not include homicidal or suicidal ideation. Thought content does not include homicidal or suicidal plan.        Cognition and Memory: Cognition normal.        Judgment: Judgment normal.     Comments: Insight is fair.  Both anxiety and depression are improved and overall he's satisfied.     Lab Review:     Component Value Date/Time   NA 136 11/30/2016 1030   K 3.9 11/30/2016 1030   CL 105 11/30/2016 1030   CO2 23 11/30/2016 1030   GLUCOSE 189 (H) 11/30/2016 1030   BUN 9 11/30/2016 1030   CREATININE 0.83 11/30/2016 1030   CALCIUM 9.3 11/30/2016 1030   PROT 6.5 11/30/2016 1030   ALBUMIN 3.5 11/30/2016 1030   AST 27 11/30/2016 1030   ALT 23 11/30/2016 1030   ALKPHOS 68 11/30/2016 1030   BILITOT 1.5 (H) 11/30/2016 1030   GFRNONAA >60 11/30/2016 1030   GFRAA >60 11/30/2016 1030       Component Value Date/Time   WBC 4.4 12/01/2016 0636   RBC 4.40 12/01/2016 0636   HGB 12.7 (L) 12/01/2016 0636   HGB 13.7  01/22/2011 1348   HCT 37.6 (L) 12/01/2016 0636   HCT 40.1 01/22/2011 1348   PLT 57 (L) 12/01/2016 0636   PLT 59 (L) 01/22/2011 1348   MCV 85.5 12/01/2016 0636   MCV 87.9 01/22/2011 1348   MCH 28.9 12/01/2016 0636   MCHC 33.8 12/01/2016 0636   RDW 14.4 12/01/2016 0636   RDW 13.9 01/22/2011 1348   LYMPHSABS 1.2 04/22/2015 1423   LYMPHSABS 1.1 01/22/2011 1348   MONOABS 0.3 04/22/2015 1423   MONOABS 0.3 01/22/2011 1348   EOSABS 0.1 04/22/2015 1423   EOSABS 0.1 01/22/2011 1348   BASOSABS 0.0 04/22/2015 1423   BASOSABS 0.0 01/22/2011 1348    No results found for: POCLITH, LITHIUM   No results found for: PHENYTOIN, PHENOBARB, VALPROATE, CBMZ   .res Assessment: Plan:    Victor [name] was seen today for follow-up, depression and medication problem.  Diagnoses and all orders for this visit:  Recurrent major depression in partial remission ([place])  Panic disorder with  agoraphobia  Generalized anxiety disorder  Insomnia due to mental condition   Greater than 50% of 30 min face to face time with patient was spent on counseling and coordination of care. We discussed Patient with a long history of recurrent major depression panic disorder generalized anxiety disorder and insomnia.  He had a good response to selegiline over a period of number of years but stopped it in 2017 due to expense of the Emsam patch.  His mood is been relatively okay until the Covid virus and the quarantine has cut out all of his activities.  In addition he has a number of health problems that make him a high risk Covid individual.  This is greatly restricted his activities and his socialization.  Is triggered recurrence of depression.  He is failed multiple other SSRIs and SNRIs.  It does not look like he is taken a try cyclic but rather than experiment with the medicine that he is never taken before it makes more sense to go back with an antidepressant that worked in the past.    Panic and anxiety symptoms are pretty well controlled at present.  We used the selegiline tablets in place of the patch as it is more affordable.  He started selegiline 5 mg half twice daily for 3 days then 1 twice dail.  We discussed side effects in detail including MAO inhibitors restrictions regarding medication interactions.  Unfortunately he had significant problems with insomnia which is a known side effect possibility and some tension.   However his depression and energy are improved and he's satisfied with meds. Insomnia is a problem and he's not sure if it's worse on selegiline 10 mg selegiline vs 7.5 mg AM.    hydroxyzine 50 mg to 2 HS for sleep. Not that helpful. Disc therapeutic napping and timing. For sleep consider retrying quetiapine and increasing the dosage. We cannot add more benzodiazepine and there are limited options that can be taken with selegiline that he is not already tried. He does not  want to try other meds for sleep right now.  Focused on his health.  Continue selegiline 7.5 mg each morning DT SE insomnia.  He had worsening symptoms of depression and anxiety when he reduced the dose to 5 mg daily.  Get enough protein and eat better.   Disc questions about Covid vaccines.  We discussed the short-term risks associated with benzodiazepines including sedation and increased fall risk among others.  Discussed long-term side effect risk including dependence, potential  withdrawal symptoms, and the potential eventual dose-related risk of dementia.  Again discussed MAO inhibitor restrictions and especially drug interaction issues.  This appt was 30 mins.  FU 6 months  Lynder Parents, MD, DFAPA    Please see After Visit Summary for patient specific instructions.  No future appointments.  No orders of the defined types were placed in this encounter.     -------------------------------

## 2019-05-01 ENCOUNTER — Other Ambulatory Visit: Payer: Self-pay | Admitting: Psychiatry

## 2019-05-01 DIAGNOSIS — F5105 Insomnia due to other mental disorder: Secondary | ICD-10-CM

## 2019-05-05 DIAGNOSIS — D72819 Decreased white blood cell count, unspecified: Secondary | ICD-10-CM | POA: Diagnosis not present

## 2019-05-05 DIAGNOSIS — D696 Thrombocytopenia, unspecified: Secondary | ICD-10-CM | POA: Diagnosis not present

## 2019-05-05 DIAGNOSIS — J0111 Acute recurrent frontal sinusitis: Secondary | ICD-10-CM | POA: Diagnosis not present

## 2019-05-05 DIAGNOSIS — E611 Iron deficiency: Secondary | ICD-10-CM | POA: Diagnosis not present

## 2019-05-05 DIAGNOSIS — E538 Deficiency of other specified B group vitamins: Secondary | ICD-10-CM | POA: Diagnosis not present

## 2019-05-05 DIAGNOSIS — Z23 Encounter for immunization: Secondary | ICD-10-CM | POA: Diagnosis not present

## 2019-05-06 DIAGNOSIS — M25512 Pain in left shoulder: Secondary | ICD-10-CM | POA: Diagnosis not present

## 2019-05-07 ENCOUNTER — Other Ambulatory Visit: Payer: Self-pay | Admitting: Orthopedic Surgery

## 2019-05-07 DIAGNOSIS — M25512 Pain in left shoulder: Secondary | ICD-10-CM

## 2019-05-07 DIAGNOSIS — R188 Other ascites: Secondary | ICD-10-CM | POA: Diagnosis not present

## 2019-05-07 DIAGNOSIS — K760 Fatty (change of) liver, not elsewhere classified: Secondary | ICD-10-CM | POA: Diagnosis not present

## 2019-05-07 DIAGNOSIS — K7460 Unspecified cirrhosis of liver: Secondary | ICD-10-CM | POA: Diagnosis not present

## 2019-05-07 DIAGNOSIS — R161 Splenomegaly, not elsewhere classified: Secondary | ICD-10-CM | POA: Diagnosis not present

## 2019-05-08 ENCOUNTER — Telehealth: Payer: Self-pay | Admitting: Nurse Practitioner

## 2019-05-08 DIAGNOSIS — K760 Fatty (change of) liver, not elsewhere classified: Secondary | ICD-10-CM | POA: Diagnosis not present

## 2019-05-08 DIAGNOSIS — Z01812 Encounter for preprocedural laboratory examination: Secondary | ICD-10-CM | POA: Diagnosis not present

## 2019-05-08 DIAGNOSIS — Z20822 Contact with and (suspected) exposure to covid-19: Secondary | ICD-10-CM | POA: Diagnosis not present

## 2019-05-08 DIAGNOSIS — D696 Thrombocytopenia, unspecified: Secondary | ICD-10-CM | POA: Diagnosis not present

## 2019-05-08 DIAGNOSIS — D126 Benign neoplasm of colon, unspecified: Secondary | ICD-10-CM | POA: Diagnosis not present

## 2019-05-08 NOTE — Telephone Encounter (Signed)
Phone call to patient to review instructions for 13 hr prep for CT arthrogram w/ contrast on 4/12 at 1445. Prescription called into [facility]. Pt aware and verbalized understanding of instructions. Prescription: 0145- 50mg  Prednisone 0745- 50mg  Prednisone 1345- 50mg  Prednisone and 50mg  Benadryl

## 2019-05-11 DIAGNOSIS — I8510 Secondary esophageal varices without bleeding: Secondary | ICD-10-CM | POA: Diagnosis not present

## 2019-05-11 DIAGNOSIS — K2950 Unspecified chronic gastritis without bleeding: Secondary | ICD-10-CM | POA: Diagnosis not present

## 2019-05-11 DIAGNOSIS — Z09 Encounter for follow-up examination after completed treatment for conditions other than malignant neoplasm: Secondary | ICD-10-CM | POA: Diagnosis not present

## 2019-05-11 DIAGNOSIS — E119 Type 2 diabetes mellitus without complications: Secondary | ICD-10-CM | POA: Diagnosis not present

## 2019-05-11 DIAGNOSIS — Z1211 Encounter for screening for malignant neoplasm of colon: Secondary | ICD-10-CM | POA: Diagnosis not present

## 2019-05-11 DIAGNOSIS — K766 Portal hypertension: Secondary | ICD-10-CM | POA: Diagnosis not present

## 2019-05-11 DIAGNOSIS — M5430 Sciatica, unspecified side: Secondary | ICD-10-CM | POA: Diagnosis not present

## 2019-05-11 DIAGNOSIS — Z794 Long term (current) use of insulin: Secondary | ICD-10-CM | POA: Diagnosis not present

## 2019-05-11 DIAGNOSIS — M19011 Primary osteoarthritis, right shoulder: Secondary | ICD-10-CM | POA: Diagnosis not present

## 2019-05-11 DIAGNOSIS — F411 Generalized anxiety disorder: Secondary | ICD-10-CM | POA: Diagnosis not present

## 2019-05-11 DIAGNOSIS — Z86010 Personal history of colonic polyps: Secondary | ICD-10-CM | POA: Diagnosis not present

## 2019-05-11 DIAGNOSIS — Z6838 Body mass index (BMI) 38.0-38.9, adult: Secondary | ICD-10-CM | POA: Diagnosis not present

## 2019-05-11 DIAGNOSIS — Z79899 Other long term (current) drug therapy: Secondary | ICD-10-CM | POA: Diagnosis not present

## 2019-05-11 DIAGNOSIS — K7460 Unspecified cirrhosis of liver: Secondary | ICD-10-CM | POA: Diagnosis not present

## 2019-05-11 DIAGNOSIS — K3189 Other diseases of stomach and duodenum: Secondary | ICD-10-CM | POA: Diagnosis not present

## 2019-05-11 DIAGNOSIS — K9181 Other intraoperative complications of digestive system: Secondary | ICD-10-CM | POA: Diagnosis not present

## 2019-05-11 DIAGNOSIS — K219 Gastro-esophageal reflux disease without esophagitis: Secondary | ICD-10-CM | POA: Diagnosis not present

## 2019-05-11 DIAGNOSIS — E1169 Type 2 diabetes mellitus with other specified complication: Secondary | ICD-10-CM | POA: Diagnosis not present

## 2019-05-11 DIAGNOSIS — T182XXA Foreign body in stomach, initial encounter: Secondary | ICD-10-CM | POA: Diagnosis not present

## 2019-05-11 DIAGNOSIS — E669 Obesity, unspecified: Secondary | ICD-10-CM | POA: Diagnosis not present

## 2019-05-11 DIAGNOSIS — K760 Fatty (change of) liver, not elsewhere classified: Secondary | ICD-10-CM | POA: Diagnosis not present

## 2019-05-11 DIAGNOSIS — I8501 Esophageal varices with bleeding: Secondary | ICD-10-CM | POA: Diagnosis not present

## 2019-05-11 DIAGNOSIS — Z87891 Personal history of nicotine dependence: Secondary | ICD-10-CM | POA: Diagnosis not present

## 2019-05-11 DIAGNOSIS — D696 Thrombocytopenia, unspecified: Secondary | ICD-10-CM | POA: Diagnosis not present

## 2019-05-11 DIAGNOSIS — I10 Essential (primary) hypertension: Secondary | ICD-10-CM | POA: Diagnosis not present

## 2019-05-11 DIAGNOSIS — G4733 Obstructive sleep apnea (adult) (pediatric): Secondary | ICD-10-CM | POA: Diagnosis not present

## 2019-05-11 DIAGNOSIS — B1910 Unspecified viral hepatitis B without hepatic coma: Secondary | ICD-10-CM | POA: Diagnosis not present

## 2019-05-11 DIAGNOSIS — R161 Splenomegaly, not elsewhere classified: Secondary | ICD-10-CM | POA: Diagnosis not present

## 2019-05-11 DIAGNOSIS — E785 Hyperlipidemia, unspecified: Secondary | ICD-10-CM | POA: Diagnosis not present

## 2019-05-15 ENCOUNTER — Other Ambulatory Visit: Payer: Self-pay | Admitting: Psychiatry

## 2019-05-18 DIAGNOSIS — L905 Scar conditions and fibrosis of skin: Secondary | ICD-10-CM | POA: Diagnosis not present

## 2019-05-18 DIAGNOSIS — L821 Other seborrheic keratosis: Secondary | ICD-10-CM | POA: Diagnosis not present

## 2019-05-20 DIAGNOSIS — N521 Erectile dysfunction due to diseases classified elsewhere: Secondary | ICD-10-CM | POA: Diagnosis not present

## 2019-05-20 DIAGNOSIS — E1159 Type 2 diabetes mellitus with other circulatory complications: Secondary | ICD-10-CM | POA: Diagnosis not present

## 2019-05-20 DIAGNOSIS — E1165 Type 2 diabetes mellitus with hyperglycemia: Secondary | ICD-10-CM | POA: Diagnosis not present

## 2019-05-20 DIAGNOSIS — E669 Obesity, unspecified: Secondary | ICD-10-CM | POA: Diagnosis not present

## 2019-05-20 DIAGNOSIS — Z5181 Encounter for therapeutic drug level monitoring: Secondary | ICD-10-CM | POA: Diagnosis not present

## 2019-05-20 DIAGNOSIS — E785 Hyperlipidemia, unspecified: Secondary | ICD-10-CM | POA: Diagnosis not present

## 2019-05-20 DIAGNOSIS — Z794 Long term (current) use of insulin: Secondary | ICD-10-CM | POA: Diagnosis not present

## 2019-05-20 DIAGNOSIS — E118 Type 2 diabetes mellitus with unspecified complications: Secondary | ICD-10-CM | POA: Diagnosis not present

## 2019-05-20 DIAGNOSIS — E1169 Type 2 diabetes mellitus with other specified complication: Secondary | ICD-10-CM | POA: Diagnosis not present

## 2019-05-20 DIAGNOSIS — E1140 Type 2 diabetes mellitus with diabetic neuropathy, unspecified: Secondary | ICD-10-CM | POA: Diagnosis not present

## 2019-05-20 DIAGNOSIS — I10 Essential (primary) hypertension: Secondary | ICD-10-CM | POA: Diagnosis not present

## 2019-05-25 ENCOUNTER — Ambulatory Visit
Admission: RE | Admit: 2019-05-25 | Discharge: 2019-05-25 | Disposition: A | Discharge status: Home / Self Care | Payer: Medicare Other | Source: Ambulatory Visit | Attending: Orthopedic Surgery | Admitting: Orthopedic Surgery

## 2019-05-25 ENCOUNTER — Other Ambulatory Visit: Payer: Self-pay

## 2019-05-25 DIAGNOSIS — M75112 Incomplete rotator cuff tear or rupture of left shoulder, not specified as traumatic: Secondary | ICD-10-CM | POA: Diagnosis not present

## 2019-05-25 DIAGNOSIS — M25512 Pain in left shoulder: Secondary | ICD-10-CM | POA: Diagnosis not present

## 2019-05-25 MED ORDER — IOPAMIDOL (ISOVUE-M 200) INJECTION 41%
12.0000 mL | Freq: Once | INTRAMUSCULAR | Status: AC
  Administered 2019-05-25: 12 mL via INTRA_ARTICULAR

## 2019-05-26 DIAGNOSIS — I8510 Secondary esophageal varices without bleeding: Secondary | ICD-10-CM | POA: Diagnosis not present

## 2019-05-26 DIAGNOSIS — Z23 Encounter for immunization: Secondary | ICD-10-CM | POA: Diagnosis not present

## 2019-05-26 DIAGNOSIS — R188 Other ascites: Secondary | ICD-10-CM | POA: Diagnosis not present

## 2019-05-26 DIAGNOSIS — K766 Portal hypertension: Secondary | ICD-10-CM | POA: Diagnosis not present

## 2019-05-26 DIAGNOSIS — K219 Gastro-esophageal reflux disease without esophagitis: Secondary | ICD-10-CM | POA: Diagnosis not present

## 2019-05-26 DIAGNOSIS — D696 Thrombocytopenia, unspecified: Secondary | ICD-10-CM | POA: Diagnosis not present

## 2019-05-26 DIAGNOSIS — K7460 Unspecified cirrhosis of liver: Secondary | ICD-10-CM | POA: Diagnosis not present

## 2019-05-28 DIAGNOSIS — M7502 Adhesive capsulitis of left shoulder: Secondary | ICD-10-CM | POA: Diagnosis not present

## 2019-05-28 DIAGNOSIS — M25512 Pain in left shoulder: Secondary | ICD-10-CM | POA: Diagnosis not present

## 2019-06-11 DIAGNOSIS — K7460 Unspecified cirrhosis of liver: Secondary | ICD-10-CM | POA: Diagnosis not present

## 2019-06-11 DIAGNOSIS — R188 Other ascites: Secondary | ICD-10-CM | POA: Diagnosis not present

## 2019-06-11 DIAGNOSIS — Z9049 Acquired absence of other specified parts of digestive tract: Secondary | ICD-10-CM | POA: Diagnosis not present

## 2019-06-11 DIAGNOSIS — R161 Splenomegaly, not elsewhere classified: Secondary | ICD-10-CM | POA: Diagnosis not present

## 2019-06-18 DIAGNOSIS — K7460 Unspecified cirrhosis of liver: Secondary | ICD-10-CM | POA: Diagnosis not present

## 2019-06-18 DIAGNOSIS — R188 Other ascites: Secondary | ICD-10-CM | POA: Diagnosis not present

## 2019-06-19 DIAGNOSIS — E119 Type 2 diabetes mellitus without complications: Secondary | ICD-10-CM | POA: Diagnosis not present

## 2019-06-19 DIAGNOSIS — G4733 Obstructive sleep apnea (adult) (pediatric): Secondary | ICD-10-CM | POA: Diagnosis not present

## 2019-06-19 DIAGNOSIS — R188 Other ascites: Secondary | ICD-10-CM | POA: Diagnosis not present

## 2019-06-19 DIAGNOSIS — E785 Hyperlipidemia, unspecified: Secondary | ICD-10-CM | POA: Diagnosis not present

## 2019-06-19 DIAGNOSIS — Z6838 Body mass index (BMI) 38.0-38.9, adult: Secondary | ICD-10-CM | POA: Diagnosis not present

## 2019-06-19 DIAGNOSIS — E669 Obesity, unspecified: Secondary | ICD-10-CM | POA: Diagnosis not present

## 2019-06-19 DIAGNOSIS — K228 Other specified diseases of esophagus: Secondary | ICD-10-CM | POA: Diagnosis not present

## 2019-06-19 DIAGNOSIS — K7460 Unspecified cirrhosis of liver: Secondary | ICD-10-CM | POA: Diagnosis not present

## 2019-06-19 DIAGNOSIS — K766 Portal hypertension: Secondary | ICD-10-CM | POA: Diagnosis not present

## 2019-06-19 DIAGNOSIS — K219 Gastro-esophageal reflux disease without esophagitis: Secondary | ICD-10-CM | POA: Diagnosis not present

## 2019-06-19 DIAGNOSIS — Z87891 Personal history of nicotine dependence: Secondary | ICD-10-CM | POA: Diagnosis not present

## 2019-06-19 DIAGNOSIS — K317 Polyp of stomach and duodenum: Secondary | ICD-10-CM | POA: Diagnosis not present

## 2019-06-19 DIAGNOSIS — K760 Fatty (change of) liver, not elsewhere classified: Secondary | ICD-10-CM | POA: Diagnosis not present

## 2019-06-19 DIAGNOSIS — Z794 Long term (current) use of insulin: Secondary | ICD-10-CM | POA: Diagnosis not present

## 2019-06-19 DIAGNOSIS — F419 Anxiety disorder, unspecified: Secondary | ICD-10-CM | POA: Diagnosis not present

## 2019-06-19 DIAGNOSIS — D696 Thrombocytopenia, unspecified: Secondary | ICD-10-CM | POA: Diagnosis not present

## 2019-06-19 DIAGNOSIS — I8500 Esophageal varices without bleeding: Secondary | ICD-10-CM | POA: Diagnosis not present

## 2019-06-19 DIAGNOSIS — F329 Major depressive disorder, single episode, unspecified: Secondary | ICD-10-CM | POA: Diagnosis not present

## 2019-06-19 DIAGNOSIS — I10 Essential (primary) hypertension: Secondary | ICD-10-CM | POA: Diagnosis not present

## 2019-06-19 DIAGNOSIS — M1990 Unspecified osteoarthritis, unspecified site: Secondary | ICD-10-CM | POA: Diagnosis not present

## 2019-06-19 DIAGNOSIS — I8510 Secondary esophageal varices without bleeding: Secondary | ICD-10-CM | POA: Diagnosis not present

## 2019-06-29 ENCOUNTER — Other Ambulatory Visit: Payer: Self-pay

## 2019-06-29 ENCOUNTER — Telehealth: Payer: Self-pay | Admitting: Psychiatry

## 2019-06-29 MED ORDER — QUETIAPINE FUMARATE 100 MG PO TABS: ORAL_TABLET | ORAL | 0 refills | Status: DC

## 2019-06-29 NOTE — Telephone Encounter (Signed)
Patient notified and Rx submitted

## 2019-06-29 NOTE — Telephone Encounter (Signed)
Patient called and said that the hydroxyine he has been on for sleep is not working and he said that at his last phone call with dr. Clovis Pu he was told that if it wasn't working that he could go back to his pld medicine and the dosage could be increased. Please give him a call at [phone]

## 2019-06-29 NOTE — Telephone Encounter (Signed)
OK.  DC hyrodxyzine DT NR. Retry quetiapine higher dose 100 mg tablets, 1-2 tablets QHS  prn severe insomnia #60, 0 RF. Please send in RX

## 2019-06-30 DIAGNOSIS — M545 Low back pain: Secondary | ICD-10-CM | POA: Diagnosis not present

## 2019-06-30 DIAGNOSIS — Z79899 Other long term (current) drug therapy: Secondary | ICD-10-CM | POA: Diagnosis not present

## 2019-06-30 DIAGNOSIS — M961 Postlaminectomy syndrome, not elsewhere classified: Secondary | ICD-10-CM | POA: Diagnosis not present

## 2019-06-30 DIAGNOSIS — Z79891 Long term (current) use of opiate analgesic: Secondary | ICD-10-CM | POA: Diagnosis not present

## 2019-06-30 DIAGNOSIS — G894 Chronic pain syndrome: Secondary | ICD-10-CM | POA: Diagnosis not present

## 2019-07-17 DIAGNOSIS — K760 Fatty (change of) liver, not elsewhere classified: Secondary | ICD-10-CM | POA: Diagnosis not present

## 2019-07-17 DIAGNOSIS — I10 Essential (primary) hypertension: Secondary | ICD-10-CM | POA: Diagnosis not present

## 2019-07-17 DIAGNOSIS — K3189 Other diseases of stomach and duodenum: Secondary | ICD-10-CM | POA: Diagnosis not present

## 2019-07-17 DIAGNOSIS — K7460 Unspecified cirrhosis of liver: Secondary | ICD-10-CM | POA: Diagnosis not present

## 2019-07-17 DIAGNOSIS — B1910 Unspecified viral hepatitis B without hepatic coma: Secondary | ICD-10-CM | POA: Diagnosis not present

## 2019-07-17 DIAGNOSIS — E785 Hyperlipidemia, unspecified: Secondary | ICD-10-CM | POA: Diagnosis not present

## 2019-07-17 DIAGNOSIS — K766 Portal hypertension: Secondary | ICD-10-CM | POA: Diagnosis not present

## 2019-07-17 DIAGNOSIS — I8500 Esophageal varices without bleeding: Secondary | ICD-10-CM | POA: Diagnosis not present

## 2019-07-17 DIAGNOSIS — I8510 Secondary esophageal varices without bleeding: Secondary | ICD-10-CM | POA: Diagnosis not present

## 2019-07-17 DIAGNOSIS — E119 Type 2 diabetes mellitus without complications: Secondary | ICD-10-CM | POA: Diagnosis not present

## 2019-07-17 DIAGNOSIS — E669 Obesity, unspecified: Secondary | ICD-10-CM | POA: Diagnosis not present

## 2019-07-17 DIAGNOSIS — D696 Thrombocytopenia, unspecified: Secondary | ICD-10-CM | POA: Diagnosis not present

## 2019-07-17 DIAGNOSIS — F411 Generalized anxiety disorder: Secondary | ICD-10-CM | POA: Diagnosis not present

## 2019-07-17 DIAGNOSIS — Z886 Allergy status to analgesic agent status: Secondary | ICD-10-CM | POA: Diagnosis not present

## 2019-07-17 DIAGNOSIS — F329 Major depressive disorder, single episode, unspecified: Secondary | ICD-10-CM | POA: Diagnosis not present

## 2019-07-17 DIAGNOSIS — Z91041 Radiographic dye allergy status: Secondary | ICD-10-CM | POA: Diagnosis not present

## 2019-07-17 DIAGNOSIS — G4733 Obstructive sleep apnea (adult) (pediatric): Secondary | ICD-10-CM | POA: Diagnosis not present

## 2019-07-17 DIAGNOSIS — Z885 Allergy status to narcotic agent status: Secondary | ICD-10-CM | POA: Diagnosis not present

## 2019-07-17 DIAGNOSIS — M5430 Sciatica, unspecified side: Secondary | ICD-10-CM | POA: Diagnosis not present

## 2019-07-17 DIAGNOSIS — F419 Anxiety disorder, unspecified: Secondary | ICD-10-CM | POA: Diagnosis not present

## 2019-07-17 DIAGNOSIS — Z9682 Presence of neurostimulator: Secondary | ICD-10-CM | POA: Diagnosis not present

## 2019-07-17 DIAGNOSIS — K219 Gastro-esophageal reflux disease without esophagitis: Secondary | ICD-10-CM | POA: Diagnosis not present

## 2019-07-17 DIAGNOSIS — Z888 Allergy status to other drugs, medicaments and biological substances status: Secondary | ICD-10-CM | POA: Diagnosis not present

## 2019-07-17 DIAGNOSIS — Z79899 Other long term (current) drug therapy: Secondary | ICD-10-CM | POA: Diagnosis not present

## 2019-07-17 DIAGNOSIS — M19011 Primary osteoarthritis, right shoulder: Secondary | ICD-10-CM | POA: Diagnosis not present

## 2019-07-17 DIAGNOSIS — K2210 Ulcer of esophagus without bleeding: Secondary | ICD-10-CM | POA: Diagnosis not present

## 2019-07-17 DIAGNOSIS — M1990 Unspecified osteoarthritis, unspecified site: Secondary | ICD-10-CM | POA: Diagnosis not present

## 2019-07-17 DIAGNOSIS — Z794 Long term (current) use of insulin: Secondary | ICD-10-CM | POA: Diagnosis not present

## 2019-07-20 NOTE — Pre-Procedure Instructions (Signed)
[place], [place] [address] Phone: [phone] Fax: [phone]  [id number] - [place], [place] [address] [place] [address] Phone: [phone] Fax: [phone]    Your procedure is scheduled on Thurs., July 23, 2019 from 7:30AM-9:21AM  Report to [facility] Entrance "A" at 5:30AM  Call this number if you have problems the morning of surgery:  [phone]   Remember:  Do not eat after midnight on June 9th  You may drink clear liquids until 3 hours (4:30AM) prior to surgery time.  Clear liquids allowed are:  Water, Juice (non-citric and without pulp - diabetics please choose diet or no sugar options), Carbonated beverages - (diabetics please choose diet or no sugar options), Clear Tea, Black Coffee only (no creamer, milk or cream including half and half), Plain Jell-O only (diabetics please choose diet or no sugar options), Gatorade (diabetics please choose diet or no sugar options) and Plain Popsicles only   Enhanced Recovery after Surgery for Orthopedics Enhanced Recovery after Surgery is a protocol used to improve the stress on your body and your recovery after surgery.  Patient Instructions  . The day of surgery (if you have diabetes): Drink By 4:30AM o  o Drink ONE (1) Gatorade 2 (G2) as directed. o This drink was given to you during your hospital  pre-op appointment visit.  o The pre-op nurse will instruct you on the time to drink the   Gatorade 2 (G2) depending on your surgery time. o Color of the Gatorade may vary. Red is not allowed. o Nothing else to drink after completing the  Gatorade 2 (G2).         If you have questions, please contact your surgeon's office.     Take these medicines the morning of surgery with A SIP OF WATER: Atenolol (TENORMIN) Atorvastatin (LIPITOR)     Pantoprazole (PROTONIX)     Selegiline  (ELDEPRYL)  As of today, STOP taking all Aspirin (unless instructed by your doctor) and Other Aspirin containing products, Vitamins, Fish oils, and Herbal medications. Also stop all NSAIDS i.e. Advil, Ibuprofen, Motrin, Aleve, Anaprox, Naproxen, BC, Goody Powders, and all Supplements.    . THE NIGHT BEFORE SURGERY, take ____42_______ units of _____Novolin 70/30______insulin.      . THE MORNING OF SURGERY, take _______0______ units of _____Novolin 70/30_____insulin.    How to Manage Your Diabetes Before and After Surgery  Why is it important to control my blood sugar before and after surgery? . Improving blood sugar levels before and after surgery helps healing and can limit problems. . A way of improving blood sugar control is eating a healthy diet by: o  Eating less sugar and carbohydrates o  Increasing activity/exercise o  Talking with your doctor about reaching your blood sugar goals . High blood sugars (greater than 180 mg/dL) can raise your risk of infections and slow your recovery, so you will need to focus on controlling your diabetes during the weeks before surgery. . Make sure that the doctor who takes care of your diabetes knows about your planned surgery including the date and location.  How do I manage my blood sugar before surgery? . Check your blood sugar at least 4 times a day, starting 2 days before surgery, to make sure that the level is not too high or low. o Check your blood sugar the morning of your surgery  when you wake up and every 2 hours until you get to the Short Stay unit. . If your blood sugar is less than 70 mg/dL, you will need to treat for low blood sugar: o Do not take insulin. o Treat a low blood sugar (less than 70 mg/dL) with  cup of clear juice (cranberry or apple), 4 glucose tablets, OR glucose gel. Recheck blood sugar in 15 minutes after treatment (to make sure it is greater than 70 mg/dL). If your blood sugar is not greater than 70 mg/dL on recheck,  call [phone] o  for further instructions. . If your CBG is greater than 220 mg/dL, you may take  of your sliding scale (correction) dose of insulin.  . If you are admitted to the hospital after surgery: o Your blood sugar will be checked by the staff and you will probably be given insulin after surgery (instead of oral diabetes medicines) to make sure you have good blood sugar levels. o The goal for blood sugar control after surgery is 80-180 mg/dL.   Reviewed and Endorsed by [facility] Patient Education Committee, August 2015  No Smoking of any kind, Tobacco, or Alcohol products 24 hours prior to your procedure. If you use a Cpap at night, you may bring all equipment for your overnight stay.  Remember:  Do not wear jewelry.  Do not wear lotions, powders, colognes, or deodorant.  Do not shave 48 hours prior to surgery.  Men may shave face and neck.  Do not bring valuables to the hospital.  [facility] is not responsible for any belongings or valuables.  Contacts, dentures or bridgework may not be worn into surgery.    For patients admitted to the hospital, discharge time will be determined by your treatment team.  Patients discharged the day of surgery will not be allowed to drive home.    Special instructions:    - Preparing For Surgery  Before surgery, you can play an important role. Because skin is not sterile, your skin needs to be as free of germs as possible. You can reduce the number of germs on your skin by washing with CHG (chlorahexidine gluconate) Soap before surgery.  CHG is an antiseptic cleaner which kills germs and bonds with the skin to continue killing germs even after washing.    Oral Hygiene is also important to reduce your risk of infection.  Remember - BRUSH YOUR TEETH THE MORNING OF SURGERY WITH YOUR REGULAR TOOTHPASTE  Please do not use if you have an allergy to CHG or antibacterial soaps. If your skin becomes reddened/irritated stop using  the CHG.  Do not shave (including legs and underarms) for at least 48 hours prior to first CHG shower. It is OK to shave your face.  Please follow these instructions carefully.   1. Shower the NIGHT BEFORE SURGERY and the MORNING OF SURGERY with CHG.   2. If you chose to wash your hair, wash your hair first as usual with your normal shampoo.  3. After you shampoo, rinse your hair and body thoroughly to remove the shampoo.  4. Use CHG as you would any other liquid soap. You can apply CHG directly to the skin and wash gently with a scrungie or a clean washcloth.   5. Apply the CHG Soap to your body ONLY FROM THE NECK DOWN.  Do not use on open wounds or open sores. Avoid contact with your eyes, ears, mouth and genitals (private parts). Wash Face and genitals (private  parts)  with your normal soap.  6. Wash thoroughly, paying special attention to the area where your surgery will be performed.  7. Thoroughly rinse your body with warm water from the neck down.  8. DO NOT shower/wash with your normal soap after using and rinsing off the CHG Soap.  9. Pat yourself dry with a CLEAN TOWEL.  10. Wear CLEAN PAJAMAS to bed the night before surgery, wear comfortable clothes the morning of surgery  11. Place CLEAN SHEETS on your bed the night of your first shower and DO NOT SLEEP WITH PETS.   Day of Surgery:  Do not apply any deodorants/lotions.  Please wear clean clothes to the hospital/surgery center.   Remember to brush your teeth WITH YOUR REGULAR TOOTHPASTE.  Please read over the following fact sheets that you were given.

## 2019-07-21 ENCOUNTER — Encounter (HOSPITAL_COMMUNITY): Payer: Self-pay

## 2019-07-21 ENCOUNTER — Encounter (HOSPITAL_COMMUNITY)
Admission: RE | Admit: 2019-07-21 | Discharge: 2019-07-21 | Disposition: A | Discharge status: Home / Self Care | Payer: Medicare Other | Source: Ambulatory Visit | Attending: Orthopedic Surgery | Admitting: Orthopedic Surgery

## 2019-07-21 ENCOUNTER — Other Ambulatory Visit: Payer: Self-pay | Admitting: Psychiatry

## 2019-07-21 ENCOUNTER — Other Ambulatory Visit: Payer: Self-pay

## 2019-07-21 ENCOUNTER — Other Ambulatory Visit (HOSPITAL_COMMUNITY)
Admission: RE | Admit: 2019-07-21 | Discharge: 2019-07-21 | Disposition: A | Discharge status: Home / Self Care | Payer: Medicare Other | Source: Ambulatory Visit | Attending: Orthopedic Surgery | Admitting: Orthopedic Surgery

## 2019-07-21 DIAGNOSIS — Z20822 Contact with and (suspected) exposure to covid-19: Secondary | ICD-10-CM | POA: Insufficient documentation

## 2019-07-21 DIAGNOSIS — Z01818 Encounter for other preprocedural examination: Secondary | ICD-10-CM | POA: Insufficient documentation

## 2019-07-21 LAB — CBC
HCT: 37.9 % — ABNORMAL LOW (ref 39.0–52.0)
Hemoglobin: 12.6 g/dL — ABNORMAL LOW (ref 13.0–17.0)
MCH: 29.4 pg (ref 26.0–34.0)
MCHC: 33.2 g/dL (ref 30.0–36.0)
MCV: 88.6 fL (ref 80.0–100.0)
Platelets: 41 10*3/uL — ABNORMAL LOW (ref 150–400)
RBC: 4.28 MIL/uL (ref 4.22–5.81)
RDW: 14.8 % (ref 11.5–15.5)
WBC: 3.2 10*3/uL — ABNORMAL LOW (ref 4.0–10.5)
nRBC: 0 % (ref 0.0–0.2)

## 2019-07-21 LAB — COMPREHENSIVE METABOLIC PANEL
ALT: 29 U/L (ref 0–44)
AST: 27 U/L (ref 15–41)
Albumin: 3 g/dL — ABNORMAL LOW (ref 3.5–5.0)
Alkaline Phosphatase: 84 U/L (ref 38–126)
Anion gap: 6 (ref 5–15)
BUN: 11 mg/dL (ref 6–20)
CO2: 26 mmol/L (ref 22–32)
Calcium: 8.9 mg/dL (ref 8.9–10.3)
Chloride: 103 mmol/L (ref 98–111)
Creatinine, Ser: 0.85 mg/dL (ref 0.61–1.24)
GFR calc Af Amer: >60 mL/min (ref >60)
GFR calc non Af Amer: >60 mL/min (ref >60)
Glucose, Bld: 195 mg/dL — ABNORMAL HIGH (ref 70–99)
Potassium: 4.3 mmol/L (ref 3.5–5.1)
Sodium: 135 mmol/L (ref 135–145)
Total Bilirubin: 1.1 mg/dL (ref 0.3–1.2)
Total Protein: 6.2 g/dL — ABNORMAL LOW (ref 6.5–8.1)

## 2019-07-21 LAB — GLUCOSE, CAPILLARY: Glucose-Capillary: 177 mg/dL — ABNORMAL HIGH (ref 70–99)

## 2019-07-21 LAB — SARS CORONAVIRUS 2 (TAT 6-24 HRS): SARS Coronavirus 2: NEGATIVE

## 2019-07-21 NOTE — Progress Notes (Signed)
PCP - DR Rockwell [place]   WITH [place] Cardiologist - DR T. TURNER    Chest x-ray - NA EKG - 07/21/19 Stress Test -NA  ECHO - NA Cardiac Cath -NA   Sleep Study - YES CPAP - YES  Fasting Blood Sugar - 100 Checks Blood Sugar _2____ times a day  Blood Thinner Instructions:NO Aspirin Instructions:STOP ERAS Protcol -INSTRUCTIONS GIVEN PRE-SURGERY Ensure or G2- YES GIVEN          ALSO BENZOYL GIVEN  COVID TEST- FOR TODAY  Anesthesia review: A1C 05/20/19  HNT  Patient denies shortness of breath, fever, cough and chest pain at PAT appointment   All instructions explained to the patient, with a verbal understanding of the material. Patient agrees to go over the instructions while at home for a better understanding. Patient also instructed to self quarantine after being tested for COVID-19. The opportunity to ask questions was provided.

## 2019-07-22 MED ORDER — DEXTROSE 5 % IV SOLN
3.0000 g | INTRAVENOUS | Status: AC
  Administered 2019-07-23: 3 g via INTRAVENOUS
  Filled 2019-07-22: qty 3

## 2019-07-22 NOTE — Progress Notes (Signed)
Anesthesia Chart Review:  Follows with cardiology for history of OSA on CPAP therapy as well as management of hypertension.  Last seen by Dr. Radford Pax 04/01/2019, noted good compliance with CPAP.  BP noted to be well controlled.  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  Last seen 05/20/2019 and A1c at that time 9.9.  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 Meld score 9 as of March 2021.  Last seen 05/26/2019.  Per note, "trace ascites on ultrasound.  No clinical evidence of hepatic encephalopathy although he does have resting tremors as well as intentional tremors.  These are not related to the liver.  Recent variceal screening on 05/11/2019 with banding performed."  Patient had another endoscopy with variceal banding x3 of small to medium varices in the distal esophagus on 07/17/2019.  Follows with hematology [facility] for history of leukopenia and thrombocytopenia.  Platelets chronically around 45-55,000.  History of spinal cord stimulator 2016 with revision 2018.  Preop labs reviewed, platelets 41,000-slightly below baseline.  WBC 3.2 which is near chronic baseline.  CBG 195 consistent with poorly controlled IDDM 2.  Remainder of labs unremarkable.  Patient underwent same procedure with Dr. Stann Mainland on the contralateral side 11/30/2016, platelets at that time 47,000.  EKG 07/21/2019:Normal sinus rhythm.  Rate 74. Anterior infarct , age undetermined  Nuclear stress 05/22/2017 (Care Everywhere): IMPRESSION:   Normal perfusion exam. Normal wall motion exam. Calculated LVEF of  74 %.   [facility] Short Stay Center/Anesthesiology Phone [phone] 07/22/2019 9:27 AM

## 2019-07-22 NOTE — Anesthesia Preprocedure Evaluation (Addendum)
Anesthesia Evaluation  Patient identified by MRN, date of birth, ID band Patient awake    Reviewed: Allergy & Precautions, NPO status , Patient's Chart, lab work & pertinent test results  Airway Mallampati: II  TM Distance: >3 FB Neck ROM: Limited    Dental  (+) Edentulous Upper, Edentulous Lower   Pulmonary former smoker,    breath sounds clear to auscultation       Cardiovascular hypertension,  Rhythm:Regular Rate:Normal     Neuro/Psych    GI/Hepatic   Endo/Other  diabetes  Renal/GU      Musculoskeletal   Abdominal (+) + obese,   Peds  Hematology   Anesthesia Other Findings   Reproductive/Obstetrics                            Anesthesia Physical Anesthesia Plan  ASA: III  Anesthesia Plan: General   Post-op Pain Management:  Regional for Post-op pain   Induction: Intravenous  PONV Risk Score and Plan: Ondansetron  Airway Management Planned: Oral ETT  Additional Equipment:   Intra-op Plan:   Post-operative Plan: Extubation in OR  Informed Consent: I have reviewed the patients History and Physical, chart, labs and discussed the procedure including the risks, benefits and alternatives for the proposed anesthesia with the patient or authorized representative who has indicated his/her understanding and acceptance.       Plan Discussed with: CRNA and Anesthesiologist  Anesthesia Plan Comments: (PAT note by Karoline Caldwell, PA-C: Follows with cardiology for history of OSA on CPAP therapy as well as management of hypertension.  Last seen by Dr. Radford Pax 04/01/2019, noted good compliance with CPAP.  BP noted to be well controlled.  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  Last seen 05/20/2019 and A1c at that time 9.9.  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  Meld score 9 as of March 2021.  Last seen 05/26/2019.  Per note, "trace ascites on ultrasound.  No clinical evidence of hepatic encephalopathy although he does have resting tremors as well as intentional tremors.  These are not related to the liver.  Recent variceal screening on 05/11/2019 with banding performed."  Patient had another endoscopy with variceal banding x3 of small to medium varices in the distal esophagus on 07/17/2019.  Follows with hematology [facility] for history of leukopenia and thrombocytopenia.  Platelets chronically around 45-55,000.  History of spinal cord stimulator 2016 with revision 2018.  Preop labs reviewed, platelets 41,000-slightly below baseline.  WBC 3.2 which is near chronic baseline.  CBG 195 consistent with poorly controlled IDDM 2.  Remainder of labs unremarkable.  Patient underwent same procedure with Dr. Stann Mainland on the contralateral side 11/30/2016, platelets at that time 47,000.  EKG 07/21/2019:Normal sinus rhythm.  Rate 74. Anterior infarct , age undetermined  Nuclear stress 05/22/2017 (Care Everywhere): IMPRESSION:   Normal perfusion exam. Normal wall motion exam. Calculated LVEF of  74 %. )       Anesthesia Quick Evaluation

## 2019-07-23 ENCOUNTER — Ambulatory Visit (HOSPITAL_COMMUNITY)
Admission: RE | Admit: 2019-07-23 | Discharge: 2019-07-23 | Disposition: A | Discharge status: Home / Self Care | Payer: Medicare Other | Attending: Orthopedic Surgery | Admitting: Orthopedic Surgery

## 2019-07-23 ENCOUNTER — Ambulatory Visit (HOSPITAL_COMMUNITY): Payer: Medicare Other | Admitting: Physician Assistant

## 2019-07-23 ENCOUNTER — Encounter (HOSPITAL_COMMUNITY)
Admission: RE | Disposition: A | Discharge status: Home / Self Care | Payer: Self-pay | Source: Home / Self Care | Attending: Orthopedic Surgery

## 2019-07-23 ENCOUNTER — Ambulatory Visit (HOSPITAL_COMMUNITY): Payer: Medicare Other | Admitting: Anesthesiology

## 2019-07-23 ENCOUNTER — Encounter (HOSPITAL_COMMUNITY): Payer: Self-pay | Admitting: Orthopedic Surgery

## 2019-07-23 ENCOUNTER — Other Ambulatory Visit: Payer: Self-pay

## 2019-07-23 DIAGNOSIS — Z79899 Other long term (current) drug therapy: Secondary | ICD-10-CM | POA: Insufficient documentation

## 2019-07-23 DIAGNOSIS — M25812 Other specified joint disorders, left shoulder: Secondary | ICD-10-CM | POA: Diagnosis not present

## 2019-07-23 DIAGNOSIS — F411 Generalized anxiety disorder: Secondary | ICD-10-CM | POA: Diagnosis not present

## 2019-07-23 DIAGNOSIS — M19012 Primary osteoarthritis, left shoulder: Secondary | ICD-10-CM | POA: Diagnosis not present

## 2019-07-23 DIAGNOSIS — Z6837 Body mass index (BMI) 37.0-37.9, adult: Secondary | ICD-10-CM | POA: Diagnosis not present

## 2019-07-23 DIAGNOSIS — M7502 Adhesive capsulitis of left shoulder: Secondary | ICD-10-CM | POA: Insufficient documentation

## 2019-07-23 DIAGNOSIS — S43432A Superior glenoid labrum lesion of left shoulder, initial encounter: Secondary | ICD-10-CM | POA: Diagnosis not present

## 2019-07-23 DIAGNOSIS — K219 Gastro-esophageal reflux disease without esophagitis: Secondary | ICD-10-CM | POA: Insufficient documentation

## 2019-07-23 DIAGNOSIS — M7542 Impingement syndrome of left shoulder: Secondary | ICD-10-CM | POA: Diagnosis not present

## 2019-07-23 DIAGNOSIS — Z794 Long term (current) use of insulin: Secondary | ICD-10-CM | POA: Insufficient documentation

## 2019-07-23 DIAGNOSIS — G8929 Other chronic pain: Secondary | ICD-10-CM | POA: Diagnosis not present

## 2019-07-23 DIAGNOSIS — G4733 Obstructive sleep apnea (adult) (pediatric): Secondary | ICD-10-CM | POA: Diagnosis not present

## 2019-07-23 DIAGNOSIS — E119 Type 2 diabetes mellitus without complications: Secondary | ICD-10-CM | POA: Diagnosis not present

## 2019-07-23 DIAGNOSIS — F329 Major depressive disorder, single episode, unspecified: Secondary | ICD-10-CM | POA: Insufficient documentation

## 2019-07-23 DIAGNOSIS — I10 Essential (primary) hypertension: Secondary | ICD-10-CM | POA: Insufficient documentation

## 2019-07-23 DIAGNOSIS — Z87891 Personal history of nicotine dependence: Secondary | ICD-10-CM | POA: Diagnosis not present

## 2019-07-23 DIAGNOSIS — M7522 Bicipital tendinitis, left shoulder: Secondary | ICD-10-CM | POA: Diagnosis not present

## 2019-07-23 DIAGNOSIS — E785 Hyperlipidemia, unspecified: Secondary | ICD-10-CM | POA: Insufficient documentation

## 2019-07-23 DIAGNOSIS — G8918 Other acute postprocedural pain: Secondary | ICD-10-CM | POA: Diagnosis not present

## 2019-07-23 DIAGNOSIS — M75112 Incomplete rotator cuff tear or rupture of left shoulder, not specified as traumatic: Secondary | ICD-10-CM | POA: Diagnosis not present

## 2019-07-23 LAB — GLUCOSE, CAPILLARY
Glucose-Capillary: 99 mg/dL (ref 70–99)
Glucose-Capillary: 105 mg/dL — ABNORMAL HIGH (ref 70–99)

## 2019-07-23 SURGERY — SHOULDER ARTHROSCOPY WITH SUBACROMIAL DECOMPRESSION AND DISTAL CLAVICLE EXCISION
Anesthesia: General | Laterality: Left

## 2019-07-23 MED ORDER — MIDAZOLAM HCL 2 MG/2ML IJ SOLN
INTRAMUSCULAR | Status: DC | PRN
  Administered 2019-07-23: 2 mg via INTRAVENOUS

## 2019-07-23 MED ORDER — PROPOFOL 10 MG/ML IV BOLUS
INTRAVENOUS | Status: DC | PRN
  Administered 2019-07-23: 150 mg via INTRAVENOUS

## 2019-07-23 MED ORDER — PROPOFOL 1000 MG/100ML IV EMUL
INTRAVENOUS | Status: AC
  Filled 2019-07-23: qty 100

## 2019-07-23 MED ORDER — FENTANYL CITRATE (PF) 250 MCG/5ML IJ SOLN
INTRAMUSCULAR | Status: AC
  Filled 2019-07-23: qty 5

## 2019-07-23 MED ORDER — PHENYLEPHRINE HCL-NACL 10-0.9 MG/250ML-% IV SOLN
INTRAVENOUS | Status: DC | PRN
Start: 2019-07-23 — End: 2019-07-23
  Administered 2019-07-23: 50 ug/min via INTRAVENOUS

## 2019-07-23 MED ORDER — ROCURONIUM BROMIDE 10 MG/ML (PF) SYRINGE
PREFILLED_SYRINGE | INTRAVENOUS | Status: AC
  Filled 2019-07-23: qty 10

## 2019-07-23 MED ORDER — EPHEDRINE 5 MG/ML INJ
INTRAVENOUS | Status: AC
  Filled 2019-07-23: qty 10

## 2019-07-23 MED ORDER — TRANEXAMIC ACID-NACL 1000-0.7 MG/100ML-% IV SOLN
1000.0000 mg | INTRAVENOUS | Status: AC
  Administered 2019-07-23: 1000 mg via INTRAVENOUS
  Filled 2019-07-23: qty 100

## 2019-07-23 MED ORDER — DEXAMETHASONE SODIUM PHOSPHATE 10 MG/ML IJ SOLN
INTRAMUSCULAR | Status: DC | PRN
  Administered 2019-07-23: 5 mg via INTRAVENOUS

## 2019-07-23 MED ORDER — PHENYLEPHRINE HCL-NACL 10-0.9 MG/250ML-% IV SOLN
INTRAVENOUS | Status: AC
  Filled 2019-07-23: qty 250

## 2019-07-23 MED ORDER — ONDANSETRON HCL 4 MG/2ML IJ SOLN
INTRAMUSCULAR | Status: DC | PRN
  Administered 2019-07-23: 4 mg via INTRAVENOUS

## 2019-07-23 MED ORDER — SODIUM CHLORIDE 0.9 % IR SOLN
Status: DC | PRN
  Administered 2019-07-23: 6000 mL

## 2019-07-23 MED ORDER — FENTANYL CITRATE (PF) 100 MCG/2ML IJ SOLN: 25.0000 ug | INTRAMUSCULAR | Status: DC | PRN

## 2019-07-23 MED ORDER — ONDANSETRON HCL 4 MG/2ML IJ SOLN
INTRAMUSCULAR | Status: AC
  Filled 2019-07-23: qty 2

## 2019-07-23 MED ORDER — MIDAZOLAM HCL 2 MG/2ML IJ SOLN
INTRAMUSCULAR | Status: AC
  Filled 2019-07-23: qty 2

## 2019-07-23 MED ORDER — SUGAMMADEX SODIUM 200 MG/2ML IV SOLN: INTRAVENOUS | Status: DC | PRN

## 2019-07-23 MED ORDER — SUCCINYLCHOLINE CHLORIDE 200 MG/10ML IV SOSY
PREFILLED_SYRINGE | INTRAVENOUS | Status: AC
  Filled 2019-07-23: qty 10

## 2019-07-23 MED ORDER — PHENYLEPHRINE 40 MCG/ML (10ML) SYRINGE FOR IV PUSH (FOR BLOOD PRESSURE SUPPORT)
PREFILLED_SYRINGE | INTRAVENOUS | Status: AC
  Filled 2019-07-23: qty 10

## 2019-07-23 MED ORDER — CHLORHEXIDINE GLUCONATE 0.12 % MT SOLN
15.0000 mL | Freq: Once | OROMUCOSAL | Status: AC
  Administered 2019-07-23: 15 mL via OROMUCOSAL
  Filled 2019-07-23: qty 15

## 2019-07-23 MED ORDER — SUGAMMADEX SODIUM 200 MG/2ML IV SOLN
INTRAVENOUS | Status: DC | PRN
  Administered 2019-07-23: 200 mg via INTRAVENOUS
  Administered 2019-07-23: 100 mg via INTRAVENOUS

## 2019-07-23 MED ORDER — LIDOCAINE 2% (20 MG/ML) 5 ML SYRINGE
INTRAMUSCULAR | Status: AC
  Filled 2019-07-23: qty 5

## 2019-07-23 MED ORDER — FENTANYL CITRATE (PF) 250 MCG/5ML IJ SOLN
INTRAMUSCULAR | Status: DC | PRN
  Administered 2019-07-23 (×2): 50 ug via INTRAVENOUS

## 2019-07-23 MED ORDER — ROCURONIUM BROMIDE 10 MG/ML (PF) SYRINGE
PREFILLED_SYRINGE | INTRAVENOUS | Status: DC | PRN
  Administered 2019-07-23: 60 mg via INTRAVENOUS

## 2019-07-23 MED ORDER — ONDANSETRON HCL 4 MG/2ML IJ SOLN: 4.0000 mg | Freq: Once | INTRAMUSCULAR | Status: DC | PRN

## 2019-07-23 MED ORDER — ORAL CARE MOUTH RINSE: 15.0000 mL | Freq: Once | OROMUCOSAL | Status: AC

## 2019-07-23 MED ORDER — LACTATED RINGERS IV SOLN: INTRAVENOUS | Status: DC | PRN

## 2019-07-23 MED ORDER — DEXAMETHASONE SODIUM PHOSPHATE 10 MG/ML IJ SOLN
INTRAMUSCULAR | Status: AC
  Filled 2019-07-23: qty 1

## 2019-07-23 MED ORDER — LIDOCAINE 2% (20 MG/ML) 5 ML SYRINGE
INTRAMUSCULAR | Status: DC | PRN
  Administered 2019-07-23: 40 mg via INTRAVENOUS

## 2019-07-23 MED ORDER — PROPOFOL 10 MG/ML IV BOLUS
INTRAVENOUS | Status: AC
  Filled 2019-07-23: qty 20

## 2019-07-23 SURGICAL SUPPLY — 33 items
BLADE SURG 11 STRL SS (BLADE) ×2 IMPLANT
CANNULA TWIST IN 8.25X7CM (CANNULA) ×2 IMPLANT
DISSECTOR  3.8MM X 13CM (MISCELLANEOUS) ×2
DISSECTOR 3.8MM X 13CM (MISCELLANEOUS) ×1 IMPLANT
DRAPE STERI 35X30 U-POUCH (DRAPES) ×2 IMPLANT
DRAPE U-SHAPE 47X51 STRL (DRAPES) ×2 IMPLANT
DURAPREP 26ML APPLICATOR (WOUND CARE) ×2 IMPLANT
GAUZE SPONGE 4X4 12PLY STRL LF (GAUZE/BANDAGES/DRESSINGS) ×2 IMPLANT
GAUZE XEROFORM 1X8 LF (GAUZE/BANDAGES/DRESSINGS) ×2 IMPLANT
GLOVE BIO SURGEON STRL SZ7.5 (GLOVE) ×2 IMPLANT
GLOVE BIOGEL PI IND STRL 8 (GLOVE) ×1 IMPLANT
GLOVE BIOGEL PI INDICATOR 8 (GLOVE) ×1
GOWN STRL REUS W/ TWL LRG LVL3 (GOWN DISPOSABLE) ×2 IMPLANT
GOWN STRL REUS W/TWL LRG LVL3 (GOWN DISPOSABLE) ×4
KIT BASIN OR (CUSTOM PROCEDURE TRAY) ×2 IMPLANT
KIT TURNOVER KIT B (KITS) ×2 IMPLANT
MANIFOLD NEPTUNE II (INSTRUMENTS) ×2 IMPLANT
NEEDLE SCORPION MULTI FIRE (NEEDLE) IMPLANT
NEEDLE SPNL 18GX3.5 QUINCKE PK (NEEDLE) ×2 IMPLANT
NS IRRIG 1000ML POUR BTL (IV SOLUTION) ×2 IMPLANT
PACK SHOULDER (CUSTOM PROCEDURE TRAY) ×2 IMPLANT
PAD ABD 8X10 STRL (GAUZE/BANDAGES/DRESSINGS) ×2 IMPLANT
PAD ARMBOARD 7.5X6 YLW CONV (MISCELLANEOUS) ×4 IMPLANT
PROBE BIPOLAR ATHRO 135MM 90D (MISCELLANEOUS) IMPLANT
SLEEVE ARM SUSPENSION SYSTEM (MISCELLANEOUS) ×2 IMPLANT
SLING ARM FOAM STRAP XLG (SOFTGOODS) ×2 IMPLANT
SPONGE LAP 4X18 RFD (DISPOSABLE) ×2 IMPLANT
SUT ETHILON 3 0 PS 1 (SUTURE) ×2 IMPLANT
SUT TIGER TAPE 7 IN WHITE (SUTURE) IMPLANT
TAPE PAPER 3X10 WHT MICROPORE (GAUZE/BANDAGES/DRESSINGS) ×2 IMPLANT
TOWEL GREEN STERILE (TOWEL DISPOSABLE) ×2 IMPLANT
TOWEL GREEN STERILE FF (TOWEL DISPOSABLE) ×2 IMPLANT
TUBING ARTHROSCOPY IRRIG 16FT (MISCELLANEOUS) ×2 IMPLANT

## 2019-07-23 NOTE — Anesthesia Procedure Notes (Signed)
Anesthesia Regional Block: Interscalene brachial plexus block   Pre-Anesthetic Checklist: ,, timeout performed, Correct Patient, Correct Site, Correct Laterality, Correct Procedure, Correct Position, site marked, Risks and benefits discussed,  Surgical consent,  Pre-op evaluation,  At surgeon's request and post-op pain management  Laterality: Left  Prep: chloraprep       Needles:  Injection technique: Single-shot  Needle Type: Stimulator Needle - 40      Needle Gauge: 22     Additional Needles:   Procedures:, nerve stimulator,,,,,,,  Narrative:  Start time: 07/23/2019 6:05 AM End time: 07/23/2019 6:15 AM Injection made incrementally with aspirations every 5 mL.  Performed by: Personally   Additional Notes: 20 cc 0.5% Bupivacaine 1:200 Epi 10 cc 1.3% Exparel injected easily

## 2019-07-23 NOTE — Discharge Instructions (Signed)
-  Maintain sling on left arm for comfort only.  He may remove this as needed and begin range of motion and use of the left shoulder immediately following surgery.  -Apply ice to the left shoulder for 20 to 30 minutes out of each hour that you are able.  You should try to do this around-the-clock to decrease pain and swelling.  -For mild to moderate pain use Tylenol as needed.  For breakthrough pain use your typical narcotic regimen.  -You may remove your postoperative bandages in 2 days.  You may begin showering at that time.  You should cover your incisions with Band-Aids at that point.  -Return to see Dr. Stann Mainland in 2 weeks for routine postop care.

## 2019-07-23 NOTE — Op Note (Signed)
Date of Surgery: 07/23/2019  INDICATIONS: Victor [name] is a 59 y.o.-year-old male with a left shoulder adhesive capsulitis, impingement, and acromioclavicular arthritis.  He presents today for arthroscopic surgery on the left shoulder with capsular release, extensive debridement, distal clavicle resection and subacromial decompression.;  The patient did consent to the procedure after discussion of the risks and benefits.  PREOPERATIVE DIAGNOSIS:  1.  Left shoulder adhesive capsulitis 2.  Left shoulder subacromial impingement 3.  Left shoulder degenerative tearing of the anterior, superior, posterior labrum 4.  Left shoulder acromioclavicular arthritis 5.  Left shoulder proximal biceps tendinitis  POSTOPERATIVE DIAGNOSIS: Same.  PROCEDURE:  1.  Left shoulder arthroscopy with arthroscopic capsular release 2.  Left shoulder arthroscopic extensive debridement including rotator interval, anterior labrum, superior labrum, posterior labrum, and biceps tenotomy 3.  Left shoulder arthroscopic subacromial decompression without coracoacromial ligament release 4.  Left shoulder arthroscopic distal clavicle resection of approximately 1 cm  SURGEON: Geralynn Rile, M.D.  ASSIST: Katy Apo, RNFA.  ANESTHESIA:  general, with interscalene block  IV FLUIDS AND URINE: See anesthesia.  ESTIMATED BLOOD LOSS: 5 mL.  IMPLANTS: None  DRAINS: None  COMPLICATIONS: None.  DESCRIPTION OF PROCEDURE: The patient was brought to the operating room and placed supine on the operating table.  The patient had been signed prior to the procedure and this was documented. The patient had the anesthesia placed by the anesthesiologist.  The patient was then placed in the right lateral decubitus position with the left arm elevated.  This was prepped and draped in standard sterile fashion.  A time-out was performed to confirm that this was the correct patient, site, side and location. The patient did receive antibiotics  prior to the incision and was re-dosed during the procedure as needed at indicated intervals.  A tourniquet was not placed.  The patient had the operative extremity prepped and draped in the standard surgical fashion.    We began the procedure with diagnostic arthroscopy.  A posterior lateral viewing portal was established about 2 cm inferior and 1 cm medial to the posterior lateral corner of the acromion.  We entered the glenohumeral joint atraumatically.  We then established a mid glenoid working portal via spinal needle localization into the rotator interval.  This was dilated and a shaver was introduced.  On the diagnostic arthroscopy we noted that there was degenerative tearing of the anterior, superior, and posterior labrum.  There was abundant capsulitis with thickening of the rotator interval tissue as well as inflammatory tissue.  He had some inflammation noted throughout the intra-articular portion of the biceps tendon.  Otherwise the subscapularis, infraspinatus, and teres minor were all intact.  The supraspinatus had partial articular tearing of approximately 20%.  There was no chondromalacia of the glenoid or humeral head.  No loose bodies.  We began with the capsular release.  Utilizing the radiofrequency wand we moved from anterior to inferior.  A plane was created between the subscapularis and the middle glenohumeral ligament.  Middle glenohumeral ligament as well as anterior capsular tissue was released.  We then continued that plane inferiorly to the 6 o'clock position.  The radiofrequency wand was used to release that tissue as well.  We then moved superiorly along the plane between the superior labrum and the undersurface of the supraspinatus and infraspinatus.  This capsule was likewise released with the radiofrequency wand.  Next, we performed an extensive debridement of the intra-articular joint.  Utilizing the motorized shaver we extensively debrided the rotator interval from  the upper  border of the subscapularis to the inferior border of the long head of the biceps tendon.  We then tenotomized the biceps tendon sharply.  We then debrided all loose and frayed tissue with motorized shaver along the anterior, superior, and posterior labrum.  We then moved to the subacromial space.  The arthroscope was introduced through the posterior portal into the subacromial space.  We then developed a lateral working portal at the midpoint of the anterior to posterior dimension of the acromioclavicular joint approximately 4 cm lateral to the lateral edge of the acromion.  Spinal needle was introduced into the space.  We then entered this bluntly with a shaver.  We debrided the bursa.  In this space we did note some partial bursal tearing of the myotendinous junction of the supraspinatus but no full-thickness rotator cuff tears.  Next, we did perform the subacromial decompression.  Patient had a calcified coracoacromial ligament.  This was debrided gently.  We then noted a type II acromium.  Using a cutting block technique with the motorized bur we flatten this with the bur into a type I morphology.  Lastly, we performed the distal clavicle resection.  Working from the anterior portal and viewing from the lateral portal we used the radiofrequency wand to develop a nice bony plane along the periosteum of the distal clavicle.  There was abundant sclerosis and undersurface spurring of the distal clavicle.  We then introduced the motorized bur through the anterior portal and performed a 1 cm resection of the distal clavicle.  Pictures were taken throughout the case.  Arthroscopic instruments were then removed the shoulder.  Arthroscopic portals were closed with nylon sutures in interrupted fashion.  The arm was cleaned and dried and standard sterile bandage was applied.  He was awakened from general anesthesia in stable condition.  All counts were correct x2 and there were no noted intraoperative  complications.  He was transported to PACU in stable condition.  POSTOPERATIVE PLAN:  Victor [name] will be in a sling for comfort only.  He can discontinue his sling and begin range of motion as tolerated.  He can begin using the arm as tolerated.  He will discharge home from PACU.  I will see him back in the office in 2 weeks for routine wound check.  He will begin outpatient physical therapy before that appointment.  He will resume all preoperative medications.

## 2019-07-23 NOTE — Brief Op Note (Signed)
07/23/2019  8:43 AM  PATIENT:  Victor [name]  59 y.o. male  PRE-OPERATIVE DIAGNOSIS:  Left shoulder acromioclavicular osteoarthritis, adhesive capsulitis  POST-OPERATIVE DIAGNOSIS:  Left shoulder acromioclavicular osteoarthritis, adhesive capsulitis  PROCEDURE:  Procedure(s) with comments: SHOULDER ARTHROSCOPY WITH CAPSULAR RELEASE, SUBACROMIAL DECOMPRESSION AND DISTAL CLAVICLE RESECTION (Left) - 90 mins  SURGEON:  Surgeon(s) and Role:    * Stann Mainland, Elly Modena, MD - Primary  PHYSICIAN ASSISTANT:   ASSISTANTS: Katy Apo, RNFA   ANESTHESIA:   regional and general  EBL:  5 cc  BLOOD ADMINISTERED:none  DRAINS: none   LOCAL MEDICATIONS USED:  NONE  SPECIMEN:  No Specimen  DISPOSITION OF SPECIMEN:  N/A  COUNTS:  YES  TOURNIQUET:  * No tourniquets in log *  DICTATION: .Note written in EPIC  PLAN OF CARE: Discharge to home after PACU  PATIENT DISPOSITION:  PACU - hemodynamically stable.   Delay start of Pharmacological VTE agent (>24hrs) due to surgical blood loss or risk of bleeding: not applicable

## 2019-07-23 NOTE — Transfer of Care (Signed)
Immediate Anesthesia Transfer of Care Note  Patient: Victor [name]  Procedure(s) Performed: SHOULDER ARTHROSCOPY WITH CAPSULAR RELEASE, SUBACROMIAL DECOMPRESSION AND DISTAL CLAVICLE RESECTION (Left )  Patient Location: PACU  Anesthesia Type:General  Level of Consciousness: drowsy, patient cooperative and responds to stimulation  Airway & Oxygen Therapy: Patient Spontanous Breathing and Patient connected to face mask oxygen  Post-op Assessment: Report given to RN, Post -op Vital signs reviewed and stable and Patient moving all extremities  Post vital signs: Reviewed and stable  Last Vitals:  Vitals Value Taken Time  BP 129/86 07/23/19 0907  Temp    Pulse 81 07/23/19 0910  Resp 15 07/23/19 0910  SpO2 95 % 07/23/19 0910  Vitals shown include unvalidated device data.  Last Pain:  Vitals:   07/23/19 0621  TempSrc: Temporal  PainSc: 5       Patients Stated Pain Goal: 2 (46/65/99 3570)  Complications: No complications documented.

## 2019-07-23 NOTE — Anesthesia Postprocedure Evaluation (Signed)
Anesthesia Post Note  Patient: SHAHMEER BUNN  Procedure(s) Performed: SHOULDER ARTHROSCOPY WITH CAPSULAR RELEASE, SUBACROMIAL DECOMPRESSION AND DISTAL CLAVICLE RESECTION (Left )     Patient location during evaluation: PACU Anesthesia Type: General Level of consciousness: awake and alert Pain management: pain level controlled Vital Signs Assessment: post-procedure vital signs reviewed and stable Respiratory status: spontaneous breathing, nonlabored ventilation, respiratory function stable and patient connected to nasal cannula oxygen Cardiovascular status: stable and blood pressure returned to baseline Postop Assessment: no apparent nausea or vomiting Anesthetic complications: no   No complications documented.  Last Vitals:  Vitals:   07/23/19 0938 07/23/19 0945  BP: 108/76 113/61  Pulse: 79 79  Resp: 16 14  Temp:  37 C  SpO2: 94% 99%    Last Pain:  Vitals:   07/23/19 0945  TempSrc:   PainSc: 0-No pain                 Lilyahna Sirmon COKER

## 2019-07-23 NOTE — Anesthesia Procedure Notes (Signed)
Procedure Name: Intubation Date/Time: 07/23/2019 7:50 AM Performed by: Michele Rockers, CRNA Pre-anesthesia Checklist: Patient identified, Patient being monitored, Emergency Drugs available and Suction available Patient Re-evaluated:Patient Re-evaluated prior to induction Oxygen Delivery Method: Circle System Utilized Preoxygenation: Pre-oxygenation with 100% oxygen Induction Type: IV induction Ventilation: Mask ventilation without difficulty Laryngoscope Size: Miller and 2 Grade View: Grade I Tube type: Oral Tube size: 8.0 mm Number of attempts: 1 Airway Equipment and Method: Stylet Placement Confirmation: ETT inserted through vocal cords under direct vision,  positive ETCO2 and breath sounds checked- equal and bilateral Secured at: 22 cm Tube secured with: Tape Dental Injury: Teeth and Oropharynx as per pre-operative assessment

## 2019-07-23 NOTE — H&P (Signed)
ORTHOPAEDIC H and P  REQUESTING PHYSICIAN: Nicholes Stairs, MD  PCP:  Antony Contras, MD  Chief Complaint: Left shoulder pain  HPI: Victor [name] is a 59 y.o. male who complains of worsening left shoulder pain.  He has history of previous treatment with arthroscopic debridement as well as recent treatments with intra-articular injections and activity modification as well as oral anti-inflammatories.  He has had persistent pain.  He is here today for arthroscopic debridement with distal clavicle resection and capsular release.  No new complaints today.  Past Medical History:  Diagnosis Date  . Anxiety    takes Xanax daily as needed  . Blood dyscrasia    thrombocytopenia   . Blood transfusion    platelets  . C. difficile colitis 03/2015  . Depression    Emsam patch daily  . Diabetes mellitus     Type II   . GERD (gastroesophageal reflux disease)    takes Protonix daily  . Hepatitis    hepatitis b/ newly diagnosed with portal hypertension  . Hepatitis B virus infection 03/2007  . History of kidney stones    passed- x 2  . History of shingles   . Hyperlipidemia    takes AtorvaStatin daily  . Hypertension    takes Atenolol and Lisinopril daily  . Obstructive sleep apnea (adult) (pediatric)    mild with AHI 11.61/hr now on CPAP at 15cm h2o, CPAP is used q night   . Osteoarthritis   . Pancreatitis   . Sciatica    lumbar region - 2010, also has had numerous injections   . Splenomegaly    LOV Dr Lamonte Sakai  12/12 on chart  . Thrombocytopenia ([place])    Past Surgical History:  Procedure Laterality Date  . BACK SURGERY     X9  . CHOLECYSTECTOMY N/A 04/26/2015   Procedure: LAPAROSCOPIC CHOLECYSTECTOMY;  Surgeon: Ralene Ok, MD;  Location: [facility];  Service: General;  Laterality: N/A;  . COLONOSCOPY W/ POLYPECTOMY    . HAND SURGERY Right    right hand x 2- injured  . KNEE SURGERY     5 surgeries to right knee, 3 surgeries to left knee  . KYPHOPLASTY    . LUMBAR  LAMINECTOMY/DECOMPRESSION MICRODISCECTOMY  02/19/2011   Procedure: LUMBAR LAMINECTOMY/DECOMPRESSION MICRODISCECTOMY;  Surgeon: Johnn Hai;  Location: [facility];  Service: Orthopedics;  Laterality: Left;  decompression l5-s1 l4-5 on left  . SHOULDER ARTHROSCOPY WITH SUBACROMIAL DECOMPRESSION Right 11/30/2016   Procedure: Right shoulder arthroscopy, extensive debridement and subacromial decompression;  Surgeon: Nicholes Stairs, MD;  Location: [place];  Service: Orthopedics;  Laterality: Right;  82  . SHOULDER SURGERY Left    left shoulder, Rotar Cuff  . SPINAL CORD STIMULATOR BATTERY EXCHANGE N/A 03/14/2016   Procedure: Revision of spinal cord stimulator battery;  Surgeon: Melina Schools, MD;  Location: [place];  Service: Orthopedics;  Laterality: N/A;  Requests 1 hour  . SPINAL CORD STIMULATOR INSERTION N/A 02/02/2015   Procedure: LUMBAR SPINAL CORD STIMULATOR INSERTION;  Surgeon: Melina Schools, MD;  Location: [place];  Service: Orthopedics;  Laterality: N/A;   Social History   Socioeconomic History  . Marital status: Married    Spouse name: Not on file  . Number of children: Not on file  . Years of education: Not on file  . Highest education level: Not on file  Occupational History  . Not on file  Tobacco Use  . Smoking status: Former Smoker    Packs/day: 1.00  Years: 15.00    Pack years: 15.00    Quit date: 12/31/1985    Years since quitting: 33.5  . Smokeless tobacco: Never Used  Vaping Use  . Vaping Use: Never used  Substance and Sexual Activity  . Alcohol use: No  . Drug use: No  . Sexual activity: Not on file  Other Topics Concern  . Not on file  Social History Narrative  . Not on file   Social Determinants of Health   Financial Resource Strain:   . Difficulty of Paying Living Expenses:   Food Insecurity:   . Worried About Charity fundraiser in the Last Year:   . Arboriculturist in the Last Year:   Transportation Needs:   . Film/video editor (Medical):   Marland Kitchen  Lack of Transportation (Non-Medical):   Physical Activity:   . Days of Exercise per Week:   . Minutes of Exercise per Session:   Stress:   . Feeling of Stress :   Social Connections:   . Frequency of Communication with Friends and Family:   . Frequency of Social Gatherings with Friends and Family:   . Attends Religious Services:   . Active Member of Clubs or Organizations:   . Attends Archivist Meetings:   Marland Kitchen Marital Status:    Family History  Problem Relation Age of Onset  . Cancer Mother    Allergies  Allergen Reactions  . Canagliflozin Itching and Other (See Comments)    Yeast infections  pancreatitis Yeast infections  pancreatitis Yeast infections  . Cyclobenzaprine Anaphylaxis and Other (See Comments)    REACTION:  Not compatible with Emsam patch---not taking this patch any longer   . Gabapentin Other (See Comments)    Other reaction(s): shortness of breath/tardive dyskinesia  . Tanzeum [Albiglutide] Other (See Comments)    PANCREATITIS  . Hydrocodone-Acetaminophen Hives    REACTION: pt turns red and has hot flashes   . Iohexol Hives    After CT a/p. Gave him Benadryl 25mg  PO for fewer than 10 hives on face/neck.  No swelling or airway issues. Gypsy Lore, RN After CT a/p. Gave him Benadryl 25mg  PO for fewer than 10 hives on face/neck.  No swelling or airway issues. Gypsy Lore, RN  . Metformin And Related Diarrhea  . Nsaids Diarrhea and Other (See Comments)    Other reaction(s): stomach upset Stomach Upset  Other reaction(s): stomach upset Stomach Upset Stomach Upset  . Other Other (See Comments)  . Vicodin [Hydrocodone-Acetaminophen] Hives and Other (See Comments)    Other reaction(s): hives/itching (although he is able to tolerate Percocet) REACTION: pt turns red and has hot flashes    Prior to Admission medications   Medication Sig Start Date End Date Taking? Authorizing Provider  alprazolam (XANAX) 2 MG tablet TAKE 1 TABLET BY MOUTH THREE TIMES A  DAY Patient taking differently: Take 2 mg by mouth 3 (three) times daily as needed for anxiety.  05/18/19  Yes Cottle, Billey Co., MD  atenolol (TENORMIN) 25 MG tablet Take 25 mg by mouth daily.    Yes [provider]  atorvastatin (LIPITOR) 40 MG tablet Take 40 mg by mouth daily before breakfast.    Yes [provider]  Cyanocobalamin (VITAMIN B 12 PO) Take 1,000 mcg by mouth daily.   Yes [provider]  ferrous sulfate (IRON SUPPLEMENT) 325 (65 FE) MG tablet Take 325 mg by mouth daily with breakfast.   Yes [provider]  furosemide (  LASIX) 20 MG tablet Take 20 mg by mouth daily.   Yes [provider]  HYDROmorphone (DILAUDID) 8 MG tablet Take 1 tablet (8 mg total) by mouth every 4 (four) hours as needed for moderate pain or severe pain. 11/30/16  Yes Nicholes Stairs, MD  hydrOXYzine (ATARAX/VISTARIL) 50 MG tablet Take 50-100 mg by mouth at bedtime.   Yes [provider]  insulin regular (NOVOLIN R) 100 units/mL injection Inject 18 Units into the skin daily with lunch.   Yes [provider]  lisinopril (ZESTRIL) 10 MG tablet Take 10 mg by mouth daily.  02/04/16  Yes [provider]  NOVOLIN 70/30 FLEXPEN (70-30) 100 UNIT/ML PEN Inject 60 Units into the skin in the morning and at bedtime.  10/29/18  Yes [provider]  pantoprazole (PROTONIX) 40 MG tablet Take 40 mg by mouth 2 (two) times daily.  11/30/15  Yes [provider]  QUEtiapine (SEROQUEL) 100 MG tablet TAKE 1-2 TABLETS BY MOUTH AT BEDTIME AS NEEDED FOR SEVERE INSOMNIA 07/21/19  Yes Cottle, Billey Co., MD  RELION INSULIN SYRINGE 31G X 15/64" 1 ML MISC USE 1 TWICE DAILY 01/21/18  Yes [provider]  selegiline (ELDEPRYL) 5 MG tablet 1 1/2 tablets each morning Patient taking differently: Take 5 mg by mouth daily.  10/21/18  Yes Cottle, Billey Co., MD  spironolactone (ALDACTONE) 50 MG tablet Take 50 mg by mouth daily.   Yes [provider]  vitamin E 400 UNIT capsule Take 400 Units by mouth daily.   Yes [provider]   No results found.  Positive ROS: All other systems have been reviewed and were otherwise negative with the exception of those mentioned in the HPI and as above.  Physical Exam: General: Alert, no acute distress Cardiovascular: No pedal edema Respiratory: No cyanosis, no use of accessory musculature GI: No organomegaly, abdomen is soft and non-tender Skin: No lesions in the area of chief complaint Neurologic: Sensation intact distally Psychiatric: Patient is competent for consent with normal mood and affect Lymphatic: No axillary or cervical lymphadenopathy  MUSCULOSKELETAL:  Left shoulder:  Skin is warm and well-perfused.  No open wounds.  Distally neurovascularly intact.  Assessment: 1.  Left shoulder osteoarthritis 2.  Left shoulder impingement  Plan: -We again discussed the recommendation to move ahead with arthroscopic management of this left shoulder at this time with capsular releases, extensive debridement, subacromial decompression, and distal clavicle resection.  -Indications for the procedure as well as risk and benefits were again reviewed with the patient.  He has provided informed consent.  We will plan for discharge home postoperatively from PACU.    Nicholes Stairs, MD Cell [phone]    07/23/2019 7:12 AM

## 2019-07-27 ENCOUNTER — Other Ambulatory Visit: Payer: Self-pay | Admitting: Psychiatry

## 2019-07-27 DIAGNOSIS — F5105 Insomnia due to other mental disorder: Secondary | ICD-10-CM

## 2019-07-27 DIAGNOSIS — R188 Other ascites: Secondary | ICD-10-CM | POA: Diagnosis not present

## 2019-07-27 DIAGNOSIS — K7460 Unspecified cirrhosis of liver: Secondary | ICD-10-CM | POA: Diagnosis not present

## 2019-07-27 DIAGNOSIS — K760 Fatty (change of) liver, not elsewhere classified: Secondary | ICD-10-CM | POA: Diagnosis not present

## 2019-07-27 DIAGNOSIS — I8510 Secondary esophageal varices without bleeding: Secondary | ICD-10-CM | POA: Diagnosis not present

## 2019-08-03 DIAGNOSIS — M25612 Stiffness of left shoulder, not elsewhere classified: Secondary | ICD-10-CM | POA: Diagnosis not present

## 2019-08-03 DIAGNOSIS — M6281 Muscle weakness (generalized): Secondary | ICD-10-CM | POA: Diagnosis not present

## 2019-08-03 DIAGNOSIS — M25512 Pain in left shoulder: Secondary | ICD-10-CM | POA: Diagnosis not present

## 2019-08-04 DIAGNOSIS — K7460 Unspecified cirrhosis of liver: Secondary | ICD-10-CM | POA: Diagnosis not present

## 2019-08-05 DIAGNOSIS — K7460 Unspecified cirrhosis of liver: Secondary | ICD-10-CM | POA: Diagnosis not present

## 2019-08-05 DIAGNOSIS — R188 Other ascites: Secondary | ICD-10-CM | POA: Diagnosis not present

## 2019-08-11 ENCOUNTER — Other Ambulatory Visit: Payer: Self-pay | Admitting: Psychiatry

## 2019-08-11 DIAGNOSIS — Z862 Personal history of diseases of the blood and blood-forming organs and certain disorders involving the immune mechanism: Secondary | ICD-10-CM | POA: Diagnosis not present

## 2019-08-11 DIAGNOSIS — Z87891 Personal history of nicotine dependence: Secondary | ICD-10-CM | POA: Diagnosis not present

## 2019-08-11 DIAGNOSIS — Z79899 Other long term (current) drug therapy: Secondary | ICD-10-CM | POA: Diagnosis not present

## 2019-08-11 DIAGNOSIS — K760 Fatty (change of) liver, not elsewhere classified: Secondary | ICD-10-CM | POA: Diagnosis not present

## 2019-08-11 DIAGNOSIS — G4733 Obstructive sleep apnea (adult) (pediatric): Secondary | ICD-10-CM | POA: Diagnosis not present

## 2019-08-11 DIAGNOSIS — E669 Obesity, unspecified: Secondary | ICD-10-CM | POA: Diagnosis not present

## 2019-08-11 DIAGNOSIS — R188 Other ascites: Secondary | ICD-10-CM | POA: Diagnosis not present

## 2019-08-11 DIAGNOSIS — E119 Type 2 diabetes mellitus without complications: Secondary | ICD-10-CM | POA: Diagnosis not present

## 2019-08-11 DIAGNOSIS — R0602 Shortness of breath: Secondary | ICD-10-CM | POA: Diagnosis not present

## 2019-08-11 DIAGNOSIS — R5383 Other fatigue: Secondary | ICD-10-CM | POA: Diagnosis not present

## 2019-08-11 DIAGNOSIS — Z794 Long term (current) use of insulin: Secondary | ICD-10-CM | POA: Diagnosis not present

## 2019-08-11 DIAGNOSIS — I10 Essential (primary) hypertension: Secondary | ICD-10-CM | POA: Diagnosis not present

## 2019-08-11 DIAGNOSIS — R0600 Dyspnea, unspecified: Secondary | ICD-10-CM | POA: Diagnosis not present

## 2019-08-12 DIAGNOSIS — I8511 Secondary esophageal varices with bleeding: Secondary | ICD-10-CM | POA: Diagnosis not present

## 2019-08-12 DIAGNOSIS — K7460 Unspecified cirrhosis of liver: Secondary | ICD-10-CM | POA: Diagnosis not present

## 2019-08-12 DIAGNOSIS — K219 Gastro-esophageal reflux disease without esophagitis: Secondary | ICD-10-CM | POA: Diagnosis not present

## 2019-08-12 DIAGNOSIS — R188 Other ascites: Secondary | ICD-10-CM | POA: Diagnosis not present

## 2019-08-13 ENCOUNTER — Telehealth: Payer: Self-pay | Admitting: Psychiatry

## 2019-08-13 NOTE — Telephone Encounter (Signed)
Pharmacy needs clarification on Rx Xanax sent 6/30. Call [place] @ [facility] [phone].

## 2019-08-13 NOTE — Telephone Encounter (Signed)
Confirmed with pharmacy.

## 2019-08-19 DIAGNOSIS — D6489 Other specified anemias: Secondary | ICD-10-CM | POA: Diagnosis not present

## 2019-08-19 DIAGNOSIS — D638 Anemia in other chronic diseases classified elsewhere: Secondary | ICD-10-CM | POA: Diagnosis not present

## 2019-08-19 DIAGNOSIS — E611 Iron deficiency: Secondary | ICD-10-CM | POA: Diagnosis not present

## 2019-08-19 DIAGNOSIS — E538 Deficiency of other specified B group vitamins: Secondary | ICD-10-CM | POA: Diagnosis not present

## 2019-08-19 DIAGNOSIS — D72819 Decreased white blood cell count, unspecified: Secondary | ICD-10-CM | POA: Diagnosis not present

## 2019-08-19 DIAGNOSIS — D513 Other dietary vitamin B12 deficiency anemia: Secondary | ICD-10-CM | POA: Diagnosis not present

## 2019-08-19 DIAGNOSIS — D696 Thrombocytopenia, unspecified: Secondary | ICD-10-CM | POA: Diagnosis not present

## 2019-09-03 DIAGNOSIS — K219 Gastro-esophageal reflux disease without esophagitis: Secondary | ICD-10-CM | POA: Diagnosis not present

## 2019-09-03 DIAGNOSIS — Z0000 Encounter for general adult medical examination without abnormal findings: Secondary | ICD-10-CM | POA: Diagnosis not present

## 2019-09-03 DIAGNOSIS — E1169 Type 2 diabetes mellitus with other specified complication: Secondary | ICD-10-CM | POA: Diagnosis not present

## 2019-09-03 DIAGNOSIS — F411 Generalized anxiety disorder: Secondary | ICD-10-CM | POA: Diagnosis not present

## 2019-09-03 DIAGNOSIS — K7460 Unspecified cirrhosis of liver: Secondary | ICD-10-CM | POA: Diagnosis not present

## 2019-09-03 DIAGNOSIS — I10 Essential (primary) hypertension: Secondary | ICD-10-CM | POA: Diagnosis not present

## 2019-09-03 DIAGNOSIS — D696 Thrombocytopenia, unspecified: Secondary | ICD-10-CM | POA: Diagnosis not present

## 2019-09-03 DIAGNOSIS — Z1389 Encounter for screening for other disorder: Secondary | ICD-10-CM | POA: Diagnosis not present

## 2019-09-03 DIAGNOSIS — E782 Mixed hyperlipidemia: Secondary | ICD-10-CM | POA: Diagnosis not present

## 2019-09-03 DIAGNOSIS — R05 Cough: Secondary | ICD-10-CM | POA: Diagnosis not present

## 2019-09-03 DIAGNOSIS — Z125 Encounter for screening for malignant neoplasm of prostate: Secondary | ICD-10-CM | POA: Diagnosis not present

## 2019-09-04 DIAGNOSIS — K760 Fatty (change of) liver, not elsewhere classified: Secondary | ICD-10-CM | POA: Diagnosis not present

## 2019-09-04 DIAGNOSIS — I8511 Secondary esophageal varices with bleeding: Secondary | ICD-10-CM | POA: Diagnosis not present

## 2019-09-04 DIAGNOSIS — R188 Other ascites: Secondary | ICD-10-CM | POA: Diagnosis not present

## 2019-09-04 DIAGNOSIS — Z86010 Personal history of colonic polyps: Secondary | ICD-10-CM | POA: Diagnosis not present

## 2019-09-04 DIAGNOSIS — K7460 Unspecified cirrhosis of liver: Secondary | ICD-10-CM | POA: Diagnosis not present

## 2019-09-04 DIAGNOSIS — K766 Portal hypertension: Secondary | ICD-10-CM | POA: Diagnosis not present

## 2019-09-04 DIAGNOSIS — D61818 Other pancytopenia: Secondary | ICD-10-CM | POA: Diagnosis not present

## 2019-09-09 DIAGNOSIS — Z5181 Encounter for therapeutic drug level monitoring: Secondary | ICD-10-CM | POA: Diagnosis not present

## 2019-09-09 DIAGNOSIS — I152 Hypertension secondary to endocrine disorders: Secondary | ICD-10-CM | POA: Diagnosis not present

## 2019-09-09 DIAGNOSIS — K7469 Other cirrhosis of liver: Secondary | ICD-10-CM | POA: Diagnosis not present

## 2019-09-09 DIAGNOSIS — E1169 Type 2 diabetes mellitus with other specified complication: Secondary | ICD-10-CM | POA: Diagnosis not present

## 2019-09-09 DIAGNOSIS — Z794 Long term (current) use of insulin: Secondary | ICD-10-CM | POA: Diagnosis not present

## 2019-09-09 DIAGNOSIS — E785 Hyperlipidemia, unspecified: Secondary | ICD-10-CM | POA: Diagnosis not present

## 2019-09-09 DIAGNOSIS — N521 Erectile dysfunction due to diseases classified elsewhere: Secondary | ICD-10-CM | POA: Diagnosis not present

## 2019-09-09 DIAGNOSIS — E669 Obesity, unspecified: Secondary | ICD-10-CM | POA: Diagnosis not present

## 2019-09-09 DIAGNOSIS — K760 Fatty (change of) liver, not elsewhere classified: Secondary | ICD-10-CM | POA: Diagnosis not present

## 2019-09-09 DIAGNOSIS — E1165 Type 2 diabetes mellitus with hyperglycemia: Secondary | ICD-10-CM | POA: Diagnosis not present

## 2019-09-09 DIAGNOSIS — E1159 Type 2 diabetes mellitus with other circulatory complications: Secondary | ICD-10-CM | POA: Diagnosis not present

## 2019-09-14 ENCOUNTER — Other Ambulatory Visit: Payer: Self-pay | Admitting: Psychiatry

## 2019-09-14 DIAGNOSIS — F331 Major depressive disorder, recurrent, moderate: Secondary | ICD-10-CM

## 2019-10-02 DIAGNOSIS — E611 Iron deficiency: Secondary | ICD-10-CM | POA: Diagnosis not present

## 2019-10-02 DIAGNOSIS — D6489 Other specified anemias: Secondary | ICD-10-CM | POA: Diagnosis not present

## 2019-10-02 DIAGNOSIS — D696 Thrombocytopenia, unspecified: Secondary | ICD-10-CM | POA: Diagnosis not present

## 2019-10-02 DIAGNOSIS — E538 Deficiency of other specified B group vitamins: Secondary | ICD-10-CM | POA: Diagnosis not present

## 2019-10-02 DIAGNOSIS — D72819 Decreased white blood cell count, unspecified: Secondary | ICD-10-CM | POA: Diagnosis not present

## 2019-10-06 DIAGNOSIS — D696 Thrombocytopenia, unspecified: Secondary | ICD-10-CM | POA: Diagnosis not present

## 2019-10-12 DIAGNOSIS — I8510 Secondary esophageal varices without bleeding: Secondary | ICD-10-CM | POA: Diagnosis not present

## 2019-10-12 DIAGNOSIS — Z86010 Personal history of colonic polyps: Secondary | ICD-10-CM | POA: Diagnosis not present

## 2019-10-12 DIAGNOSIS — R188 Other ascites: Secondary | ICD-10-CM | POA: Diagnosis not present

## 2019-10-12 DIAGNOSIS — D696 Thrombocytopenia, unspecified: Secondary | ICD-10-CM | POA: Diagnosis not present

## 2019-10-12 DIAGNOSIS — K7460 Unspecified cirrhosis of liver: Secondary | ICD-10-CM | POA: Diagnosis not present

## 2019-10-21 DIAGNOSIS — Z79899 Other long term (current) drug therapy: Secondary | ICD-10-CM | POA: Diagnosis not present

## 2019-10-21 DIAGNOSIS — Z9889 Other specified postprocedural states: Secondary | ICD-10-CM | POA: Diagnosis not present

## 2019-10-21 DIAGNOSIS — E119 Type 2 diabetes mellitus without complications: Secondary | ICD-10-CM | POA: Diagnosis not present

## 2019-10-21 DIAGNOSIS — I10 Essential (primary) hypertension: Secondary | ICD-10-CM | POA: Diagnosis not present

## 2019-10-21 DIAGNOSIS — K760 Fatty (change of) liver, not elsewhere classified: Secondary | ICD-10-CM | POA: Diagnosis not present

## 2019-10-21 DIAGNOSIS — D696 Thrombocytopenia, unspecified: Secondary | ICD-10-CM | POA: Diagnosis not present

## 2019-10-21 DIAGNOSIS — D72819 Decreased white blood cell count, unspecified: Secondary | ICD-10-CM | POA: Diagnosis not present

## 2019-10-21 DIAGNOSIS — Z87891 Personal history of nicotine dependence: Secondary | ICD-10-CM | POA: Diagnosis not present

## 2019-10-28 ENCOUNTER — Encounter: Payer: Self-pay | Admitting: Psychiatry

## 2019-10-28 ENCOUNTER — Ambulatory Visit (INDEPENDENT_AMBULATORY_CARE_PROVIDER_SITE_OTHER): Payer: Medicare Other | Admitting: Psychiatry

## 2019-10-28 ENCOUNTER — Other Ambulatory Visit: Payer: Self-pay

## 2019-10-28 DIAGNOSIS — F430 Acute stress reaction: Secondary | ICD-10-CM | POA: Diagnosis not present

## 2019-10-28 DIAGNOSIS — F5105 Insomnia due to other mental disorder: Secondary | ICD-10-CM | POA: Diagnosis not present

## 2019-10-28 DIAGNOSIS — F331 Major depressive disorder, recurrent, moderate: Secondary | ICD-10-CM

## 2019-10-28 DIAGNOSIS — F411 Generalized anxiety disorder: Secondary | ICD-10-CM | POA: Diagnosis not present

## 2019-10-28 DIAGNOSIS — F4001 Agoraphobia with panic disorder: Secondary | ICD-10-CM | POA: Diagnosis not present

## 2019-10-28 MED ORDER — QUETIAPINE FUMARATE 100 MG PO TABS: ORAL_TABLET | ORAL | 1 refills | Status: DC

## 2019-10-28 MED ORDER — ALPRAZOLAM 2 MG PO TABS: 2.0000 mg | ORAL_TABLET | Freq: Four times a day (QID) | ORAL | 5 refills | Status: DC

## 2019-10-28 MED ORDER — SELEGILINE HCL 5 MG PO TABS: ORAL_TABLET | ORAL | 1 refills | Status: DC

## 2019-10-28 NOTE — Progress Notes (Signed)
DIAMANTE RUBIN [id number] 09/23/1960 59 y.o.   Subjective:   Patient ID:  Victor [name] is a 59 y.o. (DOB May 16, 1960) male.  Chief Complaint:  Chief Complaint  Patient presents with  . Follow-up    Medication Management  . Depression    Medication Management  . Other    Insomnia  . Stress    serious health problems    Depression        Associated symptoms include fatigue.  Associated symptoms include no decreased concentration and no suicidal ideas.  Past medical history includes anxiety.   Anxiety Patient reports no confusion, decreased concentration, nervous/anxious behavior, palpitations or suicidal ideas.     [name] presents for follow-up of his psychiatric conditions today.  At visit Jul 04, 2018.  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  At  visit June 2020.  He has had problems with insomnia related to selegiline apparently.  We reduced the dose to 7.5 mg and gave it all in the morning.  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  He had benefit from selegiline 10 mg and then 7.5 mg but SE insomnia.  We also added low-dose quetiapine at night to help with sleep and potentially with depression as well.   seen August 2020.  He ended up using hydroxyzine instead of quetiapine for sleep.  He was satisfied with his meds overall.  Anxiety and depression were improved but not resolved on selegiline 7.5 mg every morning.  No meds were changed. Off and on taking selegiline 10 mg am about 4 days weekly and other days 7.5 mg am.  It has clearly helped the depression which is under control.  Xanax helps the anxiety.  seen November 2020.  The following med decisions were made: Increase hydroxyzine 50 mg to 2 HS for sleep. Continue selegiline 7.5 mg each morning DT SE insomnia.  He had worsening symptoms of depression and anxiety when he reduced the dose to 5 mg daily.   04/29/19 appt without med changes and following  noted: B-in-law died at 19 at [facility] with Covid.  Pt having problems with low platelets.  Further workup ongoing.  Sometimess feels funny in afternoon with 7.5 mg selegiline and will intermittently reduce the dose.  Still CO trouble with sleep. Drowsy if still in the afternoon. Still issues with sleep and then gets mad he can't go to sleep.  3-4 hours of sleep total.  No caffeine after lunch.  No napping.  Says quetiapine didn't help sleep but is taking hydroxyzine 50 and asks to increase it. He dropped the selegiline to 7.5 mg but still had SE.  Then reduced to 5 mg and did OK for awhile but then gradually more depression with stressors.   Tolerated it fine this time.  It is helping.  Less anxious.  Has helped the depression until the other things occurred. Was doing fine until Covid.  Is high risk with workup for leukemia with low WBC.  That's working against him.  10/28/19 appt with the following noted: They expect me to die.  Severe nonalcoholic cirrhosis.  Requiring ascites drainage.  Severe blood abnormalities.  Not ready to die.  Doctor said to get his affairs in order.  Trying to get wife ready and it's very hard.  Hard to handle.  No timeline on lifespan.  Doesn't expect to be eligible for liver transplant.   Has had 27 surgeries.  Wonders about blood cancer too  bc platelet count is so low.  Esophageal varices required surgery. Has to take it one day at a time.  Wake at night thinking about it and having NM about it. No concerns about psych meds from medical perspective per the doctors.   Restrictive diet. Taking Xanax TID and feels like he needs another in the middle of the night.  It calms him way down but doesn't make him sleep.  Will have heart racing fear and the Xanax takes it down. Takes it 730, 12 noon and 6:30 and then needs another in middle of night. No energy due to illness.  Pt reports that mood is Anxious and Depressed and both are improved with the selegiline.. Anxiety symptoms  include: Excessive Worry,.  Worry over health and low platelets.   Pt reports has difficulty falling asleep and interrupted sleep and awakens early. Pt reports that appetite is decreased. Pt reports that energy is improved and improved. Concentration is improved. Suicidal thoughts:  denied by patient. . Suicidal thoughts:  denied by patient.  No increase in pain meds usually.       No caffeine.   Uses CPAP except lately because of having nose cancer surgery.  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 Gets tired and drowsy in the day without sleep.  No caffeine after 9 pm.    Past Psychiatric Medication Trials: Trazodone, Wellbutrin, mirtazapine, duloxetine, paroxetine, citalopram, Vivactil, selegiline (Emsam) for several years with good response until 2016  and stopped DT cost,   quetiapine 100, Xanax 2 mg at night then to 4 times daily, Sonata, Lunesta, Ambien, Rozerem  Review of Systems:  Review of Systems  Constitutional: Positive for fatigue.  Cardiovascular: Negative for palpitations.  Gastrointestinal: Positive for abdominal distention.  Musculoskeletal: Positive for back pain.  Neurological: Negative for tremors and weakness.  Psychiatric/Behavioral: Positive for depression. Negative for agitation, behavioral problems, confusion, decreased concentration, dysphoric mood, hallucinations, self-injury, sleep disturbance and suicidal ideas. The patient is not nervous/anxious and is not hyperactive.     Medications: I have reviewed the patient's current medications.  Current Outpatient Medications  Medication Sig Dispense Refill  . alprazolam (XANAX) 2 MG tablet Take 1 tablet (2 mg total) by mouth in the morning, at noon, in the evening, and at bedtime. 120 tablet 5  . atenolol (TENORMIN) 25 MG tablet Take 25 mg by mouth daily.     Marland Kitchen atorvastatin (LIPITOR) 40 MG tablet Take 40 mg by mouth daily before breakfast.     . Cyanocobalamin (VITAMIN B 12 PO) Take 1,000 mcg by mouth daily.    . ferrous  sulfate (IRON SUPPLEMENT) 325 (65 FE) MG tablet Take 325 mg by mouth daily with breakfast.    . furosemide (LASIX) 20 MG tablet Take 20 mg by mouth daily.    Marland Kitchen HYDROmorphone (DILAUDID) 8 MG tablet Take 1 tablet (8 mg total) by mouth every 4 (four) hours as needed for moderate pain or severe pain. 40 tablet 0  . hydrOXYzine (ATARAX/VISTARIL) 50 MG tablet Take 50-100 mg by mouth at bedtime.    . insulin regular (NOVOLIN R) 100 units/mL injection Inject 18 Units into the skin daily with lunch.    . lisinopril (ZESTRIL) 10 MG tablet Take 10 mg by mouth daily.   0  . NOVOLIN 70/30 FLEXPEN (70-30) 100 UNIT/ML PEN Inject 60 Units into the skin in the morning and at bedtime.     . pantoprazole (PROTONIX) 40 MG tablet Take 40 mg by mouth 2 (two) times daily.  0  . QUEtiapine (SEROQUEL) 100 MG tablet 1-2 tablets at night as needed for severe insomnia 180 tablet 1  . RELION INSULIN SYRINGE 31G X 15/64" 1 ML MISC USE 1 TWICE DAILY    . selegiline (ELDEPRYL) 5 MG tablet TAKE 1 & 1/2 TABLETS BY MOUTH EVERY MORNING 135 tablet 1  . vitamin E 400 UNIT capsule Take 400 Units by mouth daily.    Marland Kitchen spironolactone (ALDACTONE) 50 MG tablet Take 50 mg by mouth daily.     No current facility-administered medications for this visit.   Facility-Administered Medications Ordered in Other Visits  Medication Dose Route Frequency Provider Last Rate Last Admin  . 0.9 %  sodium chloride infusion   Intravenous Once Ralene Ok, MD        Medication Side Effects: Insomnia  Allergies:  Allergies  Allergen Reactions  . Canagliflozin Itching and Other (See Comments)    Yeast infections  pancreatitis Yeast infections  pancreatitis Yeast infections  . Cyclobenzaprine Anaphylaxis and Other (See Comments)    REACTION:  Not compatible with Emsam patch---not taking this patch any longer   . Gabapentin Other (See Comments)    Other reaction(s): shortness of breath/tardive dyskinesia  . Tanzeum [Albiglutide] Other (See  Comments)    PANCREATITIS  . Hydrocodone-Acetaminophen Hives    REACTION: pt turns red and has hot flashes   . Iodinated Diagnostic Agents Rash    1 EPISODE, RELIEVED WITH BENADRYL  . Iohexol Hives    After CT a/p. Gave him Benadryl 25mg  PO for fewer than 10 hives on face/neck.  No swelling or airway issues. Gypsy Lore, RN After CT a/p. Gave him Benadryl 25mg  PO for fewer than 10 hives on face/neck.  No swelling or airway issues. Gypsy Lore, RN  . Metformin And Related Diarrhea  . Nsaids Diarrhea and Other (See Comments)    Other reaction(s): stomach upset Stomach Upset  Other reaction(s): stomach upset Stomach Upset Stomach Upset  . Other Other (See Comments)  . Vicodin [Hydrocodone-Acetaminophen] Hives and Other (See Comments)    Other reaction(s): hives/itching (although he is able to tolerate Percocet) REACTION: pt turns red and has hot flashes     Past Medical History:  Diagnosis Date  . Anxiety    takes Xanax daily as needed  . Blood dyscrasia    thrombocytopenia   . Blood transfusion    platelets  . C. difficile colitis 03/2015  . Depression    Emsam patch daily  . Diabetes mellitus     Type II   . GERD (gastroesophageal reflux disease)    takes Protonix daily  . Hepatitis    hepatitis b/ newly diagnosed with portal hypertension  . Hepatitis B virus infection 03/2007  . History of kidney stones    passed- x 2  . History of shingles   . Hyperlipidemia    takes AtorvaStatin daily  . Hypertension    takes Atenolol and Lisinopril daily  . Obstructive sleep apnea (adult) (pediatric)    mild with AHI 11.61/hr now on CPAP at 15cm h2o, CPAP is used q night   . Osteoarthritis   . Pancreatitis   . Sciatica    lumbar region - 2010, also has had numerous injections   . Splenomegaly    LOV Dr Lamonte Sakai  12/12 on chart  . Thrombocytopenia ([place])     Family History  Problem Relation Age of Onset  . Cancer Mother     Social History   Socioeconomic History  .  Marital  status: Married    Spouse name: Not on file  . Number of children: Not on file  . Years of education: Not on file  . Highest education level: Not on file  Occupational History  . Not on file  Tobacco Use  . Smoking status: Former Smoker    Packs/day: 1.00    Years: 15.00    Pack years: 15.00    Quit date: 12/31/1985    Years since quitting: 33.8  . Smokeless tobacco: Never Used  Vaping Use  . Vaping Use: Never used  Substance and Sexual Activity  . Alcohol use: No  . Drug use: No  . Sexual activity: Not on file  Other Topics Concern  . Not on file  Social History Narrative  . Not on file   Social Determinants of Health   Financial Resource Strain:   . Difficulty of Paying Living Expenses: Not on file  Food Insecurity:   . Worried About Charity fundraiser in the Last Year: Not on file  . Ran Out of Food in the Last Year: Not on file  Transportation Needs:   . Lack of Transportation (Medical): Not on file  . Lack of Transportation (Non-Medical): Not on file  Physical Activity:   . Days of Exercise per Week: Not on file  . Minutes of Exercise per Session: Not on file  Stress:   . Feeling of Stress : Not on file  Social Connections:   . Frequency of Communication with Friends and Family: Not on file  . Frequency of Social Gatherings with Friends and Family: Not on file  . Attends Religious Services: Not on file  . Active Member of Clubs or Organizations: Not on file  . Attends Archivist Meetings: Not on file  . Marital Status: Not on file  Intimate Partner Violence:   . Fear of Current or Ex-Partner: Not on file  . Emotionally Abused: Not on file  . Physically Abused: Not on file  . Sexually Abused: Not on file    Past Medical History, Surgical history, Social history, and Family history were reviewed and updated as appropriate.   Please see review of systems for further details on the patient's review from today.   Objective:   Physical Exam:   There were no vitals taken for this visit.  Physical Exam Constitutional:      General: He is not in acute distress.    Appearance: He is well-developed.  Musculoskeletal:        General: No deformity.  Neurological:     Mental Status: He is alert and oriented to person, place, and time.     Coordination: Coordination normal.     Gait: Gait abnormal.  Psychiatric:        Attention and Perception: Attention normal. He is attentive.        Mood and Affect: Mood is anxious and depressed. Affect is not labile, blunt, flat, angry or inappropriate.        Speech: Speech normal. Speech is not rapid and pressured or slurred.        Behavior: Behavior normal. Behavior is not slowed.        Thought Content: Thought content normal. Thought content does not include homicidal or suicidal ideation. Thought content does not include homicidal or suicidal plan.        Cognition and Memory: Cognition normal.        Judgment: Judgment normal.     Comments: Insight  is fair.  Both anxiety and depression are worse DT bad news about health.     Lab Review:     Component Value Date/Time   NA 135 07/21/2019 1125   K 4.3 07/21/2019 1125   CL 103 07/21/2019 1125   CO2 26 07/21/2019 1125   GLUCOSE 195 (H) 07/21/2019 1125   BUN 11 07/21/2019 1125   CREATININE 0.85 07/21/2019 1125   CALCIUM 8.9 07/21/2019 1125   PROT 6.2 (L) 07/21/2019 1125   ALBUMIN 3.0 (L) 07/21/2019 1125   AST 27 07/21/2019 1125   ALT 29 07/21/2019 1125   ALKPHOS 84 07/21/2019 1125   BILITOT 1.1 07/21/2019 1125   GFRNONAA >60 07/21/2019 1125   GFRAA >60 07/21/2019 1125       Component Value Date/Time   WBC 3.2 (L) 07/21/2019 1125   RBC 4.28 07/21/2019 1125   HGB 12.6 (L) 07/21/2019 1125   HGB 13.7 01/22/2011 1348   HCT 37.9 (L) 07/21/2019 1125   HCT 40.1 01/22/2011 1348   PLT 41 (L) 07/21/2019 1125   PLT 59 (L) 01/22/2011 1348   MCV 88.6 07/21/2019 1125   MCV 87.9 01/22/2011 1348   MCH 29.4 07/21/2019 1125   MCHC  33.2 07/21/2019 1125   RDW 14.8 07/21/2019 1125   RDW 13.9 01/22/2011 1348   LYMPHSABS 1.2 04/22/2015 1423   LYMPHSABS 1.1 01/22/2011 1348   MONOABS 0.3 04/22/2015 1423   MONOABS 0.3 01/22/2011 1348   EOSABS 0.1 04/22/2015 1423   EOSABS 0.1 01/22/2011 1348   BASOSABS 0.0 04/22/2015 1423   BASOSABS 0.0 01/22/2011 1348    No results found for: POCLITH, LITHIUM   No results found for: PHENYTOIN, PHENOBARB, VALPROATE, CBMZ   .res Assessment: Plan:    Moriah was seen today for follow-up, depression, other and stress.  Diagnoses and all orders for this visit:  Major depressive disorder, recurrent episode, moderate (HCC) -     selegiline (ELDEPRYL) 5 MG tablet; TAKE 1 & 1/2 TABLETS BY MOUTH EVERY MORNING  Generalized anxiety disorder -     alprazolam (XANAX) 2 MG tablet; Take 1 tablet (2 mg total) by mouth in the morning, at noon, in the evening, and at bedtime.  Insomnia due to mental condition -     alprazolam (XANAX) 2 MG tablet; Take 1 tablet (2 mg total) by mouth in the morning, at noon, in the evening, and at bedtime. -     QUEtiapine (SEROQUEL) 100 MG tablet; 1-2 tablets at night as needed for severe insomnia  Panic disorder with agoraphobia -     alprazolam (XANAX) 2 MG tablet; Take 1 tablet (2 mg total) by mouth in the morning, at noon, in the evening, and at bedtime.  Acute stress reaction with predominately emotional disturbance   Greater than 50% of 30 min face to face time with patient was spent on counseling and coordination of care. We discussed Patient with a long history of recurrent major depression panic disorder generalized anxiety disorder and insomnia.  He had a good response to selegiline over a period of number of years but stopped it in 2017 due to expense of the Emsam patch.  His mood is been relatively okay until the Covid virus and the quarantine has cut out all of his activities.  In addition he has a number of health problems that make him a high risk  Covid individual.  This is greatly restricted his activities and his socialization.  Is triggered recurrence of depression.  He is  failed multiple other SSRIs and SNRIs.  It does not look like he is taken a try cyclic but rather than experiment with the medicine that he is never taken before it makes more sense to go back with an antidepressant that worked in the past.    Panic and anxiety symptoms are pretty well controlled at present.  We used the selegiline tablets in place of the patch as it is more affordable.  He started selegiline 5 mg half twice daily for 3 days then 1 twice dail.  We discussed side effects in detail including MAO inhibitors restrictions regarding medication interactions.  Unfortunately he had significant problems with insomnia which is a known side effect possibility and some tension.   However his depression and energy are improved and he's satisfied with meds. Insomnia is a problem and he's not sure if it's worse on selegiline 10 mg selegiline vs 7.5 mg AM.    hydroxyzine 50 mg to 2 HS for sleep. Not that helpful. Stop it. Disc therapeutic napping and timing. He is asking to increase the alprazolam due to the worsening stress and insomnia.  He does take the alprazolam with breakfast lunch and dinner and therefore it is likely that it is wearing off in the middle of the night contributing to his early morning awakening.  This leads to excessive rumination on his pending death.  Given the circumstances it is reasonable to increase alprazolam to 2 mg 4 times daily.  He is aware that this is a high dose and of asked him not to take other sedatives at night.  We discussed the fall risk and that given his low platelet count if he has a fall he is at increased risk of complications of bleeding.  He understands this risk.  He states it is not overly sedating to him when he takes it during the day. Increase alprazolam to 2 mg 4 times daily.  Continue selegiline 7.5 mg each morning DT  SE insomnia.  He had worsening symptoms of depression and anxiety when he reduced the dose to 5 mg daily.  Discussed his terminal prognosis and the stress that that brings on both himself and his family ways to manage it.  We discussed the short-term risks associated with benzodiazepines including sedation and increased fall risk among others.  Discussed long-term side effect risk including dependence, potential withdrawal symptoms, and the potential eventual dose-related risk of dementia.  Again discussed MAO inhibitor restrictions and especially drug interaction issues.  This appt was 30 mins.  FU 6 months  Lynder Parents, MD, DFAPA    Please see After Visit Summary for patient specific instructions.  Future Appointments  Date Time Provider [place]  04/26/2020  9:15 AM Cottle, Billey Co., MD [facility] None    No orders of the defined types were placed in this encounter.     -------------------------------

## 2019-11-02 DIAGNOSIS — I10 Essential (primary) hypertension: Secondary | ICD-10-CM | POA: Diagnosis not present

## 2019-11-02 DIAGNOSIS — D72819 Decreased white blood cell count, unspecified: Secondary | ICD-10-CM | POA: Diagnosis not present

## 2019-11-02 DIAGNOSIS — K7460 Unspecified cirrhosis of liver: Secondary | ICD-10-CM | POA: Diagnosis not present

## 2019-11-02 DIAGNOSIS — Z9989 Dependence on other enabling machines and devices: Secondary | ICD-10-CM | POA: Diagnosis not present

## 2019-11-02 DIAGNOSIS — E611 Iron deficiency: Secondary | ICD-10-CM | POA: Diagnosis not present

## 2019-11-02 DIAGNOSIS — E785 Hyperlipidemia, unspecified: Secondary | ICD-10-CM | POA: Diagnosis not present

## 2019-11-02 DIAGNOSIS — D696 Thrombocytopenia, unspecified: Secondary | ICD-10-CM | POA: Diagnosis not present

## 2019-11-02 DIAGNOSIS — E1169 Type 2 diabetes mellitus with other specified complication: Secondary | ICD-10-CM | POA: Diagnosis not present

## 2019-11-02 DIAGNOSIS — G4733 Obstructive sleep apnea (adult) (pediatric): Secondary | ICD-10-CM | POA: Diagnosis not present

## 2019-11-02 DIAGNOSIS — E538 Deficiency of other specified B group vitamins: Secondary | ICD-10-CM | POA: Diagnosis not present

## 2019-11-04 DIAGNOSIS — Z79891 Long term (current) use of opiate analgesic: Secondary | ICD-10-CM | POA: Diagnosis not present

## 2019-11-04 DIAGNOSIS — M545 Low back pain: Secondary | ICD-10-CM | POA: Diagnosis not present

## 2019-11-04 DIAGNOSIS — M961 Postlaminectomy syndrome, not elsewhere classified: Secondary | ICD-10-CM | POA: Diagnosis not present

## 2019-11-05 DIAGNOSIS — Z79899 Other long term (current) drug therapy: Secondary | ICD-10-CM | POA: Diagnosis not present

## 2019-11-05 DIAGNOSIS — Z5181 Encounter for therapeutic drug level monitoring: Secondary | ICD-10-CM | POA: Diagnosis not present

## 2019-11-18 DIAGNOSIS — Z23 Encounter for immunization: Secondary | ICD-10-CM | POA: Diagnosis not present

## 2019-12-07 DIAGNOSIS — D7589 Other specified diseases of blood and blood-forming organs: Secondary | ICD-10-CM | POA: Diagnosis not present

## 2019-12-07 DIAGNOSIS — R432 Parageusia: Secondary | ICD-10-CM | POA: Diagnosis not present

## 2019-12-11 DIAGNOSIS — K7460 Unspecified cirrhosis of liver: Secondary | ICD-10-CM | POA: Diagnosis not present

## 2019-12-11 DIAGNOSIS — R350 Frequency of micturition: Secondary | ICD-10-CM | POA: Diagnosis not present

## 2019-12-11 DIAGNOSIS — R300 Dysuria: Secondary | ICD-10-CM | POA: Diagnosis not present

## 2019-12-25 DIAGNOSIS — L299 Pruritus, unspecified: Secondary | ICD-10-CM | POA: Diagnosis not present

## 2019-12-25 DIAGNOSIS — R438 Other disturbances of smell and taste: Secondary | ICD-10-CM | POA: Diagnosis not present

## 2019-12-25 DIAGNOSIS — B370 Candidal stomatitis: Secondary | ICD-10-CM | POA: Diagnosis not present

## 2019-12-28 DIAGNOSIS — Z23 Encounter for immunization: Secondary | ICD-10-CM | POA: Diagnosis not present

## 2020-01-27 DIAGNOSIS — R188 Other ascites: Secondary | ICD-10-CM | POA: Diagnosis not present

## 2020-01-27 DIAGNOSIS — Z79899 Other long term (current) drug therapy: Secondary | ICD-10-CM | POA: Diagnosis not present

## 2020-01-27 DIAGNOSIS — K7460 Unspecified cirrhosis of liver: Secondary | ICD-10-CM | POA: Diagnosis not present

## 2020-01-28 DIAGNOSIS — K7460 Unspecified cirrhosis of liver: Secondary | ICD-10-CM | POA: Diagnosis not present

## 2020-01-28 DIAGNOSIS — R188 Other ascites: Secondary | ICD-10-CM | POA: Diagnosis not present

## 2020-02-01 DIAGNOSIS — E538 Deficiency of other specified B group vitamins: Secondary | ICD-10-CM | POA: Diagnosis not present

## 2020-02-01 DIAGNOSIS — D72819 Decreased white blood cell count, unspecified: Secondary | ICD-10-CM | POA: Diagnosis not present

## 2020-02-01 DIAGNOSIS — E611 Iron deficiency: Secondary | ICD-10-CM | POA: Diagnosis not present

## 2020-02-01 DIAGNOSIS — D696 Thrombocytopenia, unspecified: Secondary | ICD-10-CM | POA: Diagnosis not present

## 2020-02-02 DIAGNOSIS — I8510 Secondary esophageal varices without bleeding: Secondary | ICD-10-CM | POA: Diagnosis not present

## 2020-02-02 DIAGNOSIS — E1165 Type 2 diabetes mellitus with hyperglycemia: Secondary | ICD-10-CM | POA: Diagnosis not present

## 2020-02-02 DIAGNOSIS — K7460 Unspecified cirrhosis of liver: Secondary | ICD-10-CM | POA: Diagnosis not present

## 2020-02-02 DIAGNOSIS — D696 Thrombocytopenia, unspecified: Secondary | ICD-10-CM | POA: Diagnosis not present

## 2020-02-02 DIAGNOSIS — R188 Other ascites: Secondary | ICD-10-CM | POA: Diagnosis not present

## 2020-02-11 DIAGNOSIS — D696 Thrombocytopenia, unspecified: Secondary | ICD-10-CM | POA: Diagnosis not present

## 2020-02-11 DIAGNOSIS — K7460 Unspecified cirrhosis of liver: Secondary | ICD-10-CM | POA: Diagnosis not present

## 2020-02-11 DIAGNOSIS — R188 Other ascites: Secondary | ICD-10-CM | POA: Diagnosis not present

## 2020-02-11 DIAGNOSIS — I8510 Secondary esophageal varices without bleeding: Secondary | ICD-10-CM | POA: Diagnosis not present

## 2020-02-11 DIAGNOSIS — E1165 Type 2 diabetes mellitus with hyperglycemia: Secondary | ICD-10-CM | POA: Diagnosis not present

## 2020-02-17 DIAGNOSIS — E669 Obesity, unspecified: Secondary | ICD-10-CM | POA: Diagnosis not present

## 2020-02-17 DIAGNOSIS — E785 Hyperlipidemia, unspecified: Secondary | ICD-10-CM | POA: Diagnosis not present

## 2020-02-17 DIAGNOSIS — E1169 Type 2 diabetes mellitus with other specified complication: Secondary | ICD-10-CM | POA: Diagnosis not present

## 2020-02-17 DIAGNOSIS — E118 Type 2 diabetes mellitus with unspecified complications: Secondary | ICD-10-CM | POA: Diagnosis not present

## 2020-02-17 DIAGNOSIS — K760 Fatty (change of) liver, not elsewhere classified: Secondary | ICD-10-CM | POA: Diagnosis not present

## 2020-02-17 DIAGNOSIS — Z794 Long term (current) use of insulin: Secondary | ICD-10-CM | POA: Diagnosis not present

## 2020-02-17 DIAGNOSIS — K7469 Other cirrhosis of liver: Secondary | ICD-10-CM | POA: Diagnosis not present

## 2020-02-17 DIAGNOSIS — E1165 Type 2 diabetes mellitus with hyperglycemia: Secondary | ICD-10-CM | POA: Diagnosis not present

## 2020-02-17 DIAGNOSIS — I152 Hypertension secondary to endocrine disorders: Secondary | ICD-10-CM | POA: Diagnosis not present

## 2020-02-17 DIAGNOSIS — E1140 Type 2 diabetes mellitus with diabetic neuropathy, unspecified: Secondary | ICD-10-CM | POA: Diagnosis not present

## 2020-02-17 DIAGNOSIS — E1159 Type 2 diabetes mellitus with other circulatory complications: Secondary | ICD-10-CM | POA: Diagnosis not present

## 2020-02-17 DIAGNOSIS — N521 Erectile dysfunction due to diseases classified elsewhere: Secondary | ICD-10-CM | POA: Diagnosis not present

## 2020-02-18 DIAGNOSIS — K7460 Unspecified cirrhosis of liver: Secondary | ICD-10-CM | POA: Diagnosis not present

## 2020-02-18 DIAGNOSIS — R161 Splenomegaly, not elsewhere classified: Secondary | ICD-10-CM | POA: Diagnosis not present

## 2020-02-18 DIAGNOSIS — R188 Other ascites: Secondary | ICD-10-CM | POA: Diagnosis not present

## 2020-03-04 DIAGNOSIS — I10 Essential (primary) hypertension: Secondary | ICD-10-CM | POA: Diagnosis not present

## 2020-03-04 DIAGNOSIS — F411 Generalized anxiety disorder: Secondary | ICD-10-CM | POA: Diagnosis not present

## 2020-03-04 DIAGNOSIS — D696 Thrombocytopenia, unspecified: Secondary | ICD-10-CM | POA: Diagnosis not present

## 2020-03-04 DIAGNOSIS — K219 Gastro-esophageal reflux disease without esophagitis: Secondary | ICD-10-CM | POA: Diagnosis not present

## 2020-03-04 DIAGNOSIS — K769 Liver disease, unspecified: Secondary | ICD-10-CM | POA: Diagnosis not present

## 2020-03-04 DIAGNOSIS — M62830 Muscle spasm of back: Secondary | ICD-10-CM | POA: Diagnosis not present

## 2020-03-04 DIAGNOSIS — Z794 Long term (current) use of insulin: Secondary | ICD-10-CM | POA: Diagnosis not present

## 2020-03-04 DIAGNOSIS — G4733 Obstructive sleep apnea (adult) (pediatric): Secondary | ICD-10-CM | POA: Diagnosis not present

## 2020-03-04 DIAGNOSIS — K7460 Unspecified cirrhosis of liver: Secondary | ICD-10-CM | POA: Diagnosis not present

## 2020-03-04 DIAGNOSIS — G4700 Insomnia, unspecified: Secondary | ICD-10-CM | POA: Diagnosis not present

## 2020-03-04 DIAGNOSIS — Z79891 Long term (current) use of opiate analgesic: Secondary | ICD-10-CM | POA: Diagnosis not present

## 2020-03-04 DIAGNOSIS — E1169 Type 2 diabetes mellitus with other specified complication: Secondary | ICD-10-CM | POA: Diagnosis not present

## 2020-03-04 DIAGNOSIS — M545 Low back pain, unspecified: Secondary | ICD-10-CM | POA: Diagnosis not present

## 2020-03-04 DIAGNOSIS — E782 Mixed hyperlipidemia: Secondary | ICD-10-CM | POA: Diagnosis not present

## 2020-03-31 DIAGNOSIS — H401131 Primary open-angle glaucoma, bilateral, mild stage: Secondary | ICD-10-CM | POA: Diagnosis not present

## 2020-03-31 DIAGNOSIS — H25813 Combined forms of age-related cataract, bilateral: Secondary | ICD-10-CM | POA: Diagnosis not present

## 2020-03-31 DIAGNOSIS — E119 Type 2 diabetes mellitus without complications: Secondary | ICD-10-CM | POA: Diagnosis not present

## 2020-04-05 ENCOUNTER — Ambulatory Visit: Payer: Medicare Other | Admitting: Cardiology

## 2020-04-11 DIAGNOSIS — Z20822 Contact with and (suspected) exposure to covid-19: Secondary | ICD-10-CM | POA: Diagnosis not present

## 2020-04-11 DIAGNOSIS — Z01812 Encounter for preprocedural laboratory examination: Secondary | ICD-10-CM | POA: Diagnosis not present

## 2020-04-13 ENCOUNTER — Other Ambulatory Visit: Payer: Self-pay

## 2020-04-13 ENCOUNTER — Encounter: Payer: Self-pay | Admitting: Cardiology

## 2020-04-13 ENCOUNTER — Ambulatory Visit (INDEPENDENT_AMBULATORY_CARE_PROVIDER_SITE_OTHER): Payer: Medicare Other | Admitting: Cardiology

## 2020-04-13 VITALS — BP 128/70 | HR 75 | Ht 72.0 in | Wt 297.4 lb

## 2020-04-13 DIAGNOSIS — G4733 Obstructive sleep apnea (adult) (pediatric): Secondary | ICD-10-CM | POA: Diagnosis not present

## 2020-04-13 DIAGNOSIS — I10 Essential (primary) hypertension: Secondary | ICD-10-CM

## 2020-04-13 NOTE — Progress Notes (Signed)
Date:  04/13/2020   ID:  Victor [name], DOB 28-Aug-1960, MRN [id number]  PCP:  Antony Contras, MD  Sleep Medicine:  Fransico Him, MD Electrophysiologist:  None   Chief Complaint:  OSA  History of Present Illness:    Victor [name] is a 60 y.o. male with a hx of OSA, obesity and HTN. He has a history of mild OSA with an AHI of 11.61/hr and now on CPAP at 15cm H2O.  He is doing well with his CPAP device and thinks that he has gotten used to it.  He tolerates the mask and feels the pressure is adequate.  Since going on CPAP he feels rested in the am and has no significant daytime sleepiness but does have to take a sleeping pill at night.  He has some mouth dryness but thinks that it is related to his medications.  He does have some nasal dryness or nasal congestion related to allergies.  He does not think that he snores.    Prior CV studies:   The following studies were reviewed today:  PAP compliance download from [facility]  Past Medical History:  Diagnosis Date  . Anxiety    takes Xanax daily as needed  . Blood dyscrasia    thrombocytopenia   . Blood transfusion    platelets  . C. difficile colitis 03/2015  . Depression    Emsam patch daily  . Diabetes mellitus     Type II   . GERD (gastroesophageal reflux disease)    takes Protonix daily  . Hepatitis    hepatitis b/ newly diagnosed with portal hypertension  . Hepatitis B virus infection 03/2007  . History of kidney stones    passed- x 2  . History of shingles   . Hyperlipidemia    takes AtorvaStatin daily  . Hypertension    takes Atenolol and Lisinopril daily  . Obstructive sleep apnea (adult) (pediatric)    mild with AHI 11.61/hr now on CPAP at 15cm h2o, CPAP is used q night   . Osteoarthritis   . Pancreatitis   . Sciatica    lumbar region - 2010, also has had numerous injections   . Splenomegaly    LOV Dr Lamonte Sakai  12/12 on chart  . Thrombocytopenia ([place])    Past Surgical History:  Procedure Laterality Date  . BACK  SURGERY     X9  . CHOLECYSTECTOMY N/A 04/26/2015   Procedure: LAPAROSCOPIC CHOLECYSTECTOMY;  Surgeon: Ralene Ok, MD;  Location: [facility];  Service: General;  Laterality: N/A;  . COLONOSCOPY W/ POLYPECTOMY    . HAND SURGERY Right    right hand x 2- injured  . KNEE SURGERY     5 surgeries to right knee, 3 surgeries to left knee  . KYPHOPLASTY    . LUMBAR LAMINECTOMY/DECOMPRESSION MICRODISCECTOMY  02/19/2011   Procedure: LUMBAR LAMINECTOMY/DECOMPRESSION MICRODISCECTOMY;  Surgeon: Johnn Hai;  Location: [facility];  Service: Orthopedics;  Laterality: Left;  decompression l5-s1 l4-5 on left  . SHOULDER ARTHROSCOPY WITH SUBACROMIAL DECOMPRESSION Right 11/30/2016   Procedure: Right shoulder arthroscopy, extensive debridement and subacromial decompression;  Surgeon: Nicholes Stairs, MD;  Location: [facility];  Service: Orthopedics;  Laterality: Right;  80  . SHOULDER SURGERY Left    left shoulder, Rotar Cuff  . SPINAL CORD STIMULATOR BATTERY EXCHANGE N/A 03/14/2016   Procedure: Revision of spinal cord stimulator battery;  Surgeon: Melina Schools, MD;  Location: [facility];  Service: Orthopedics;  Laterality: N/A;  Requests 1 hour  .  SPINAL CORD STIMULATOR INSERTION N/A 02/02/2015   Procedure: LUMBAR SPINAL CORD STIMULATOR INSERTION;  Surgeon: Melina Schools, MD;  Location: [place];  Service: Orthopedics;  Laterality: N/A;     Current Meds  Medication Sig  . alprazolam (XANAX) 2 MG tablet Take 1 tablet (2 mg total) by mouth in the morning, at noon, in the evening, and at bedtime.  Marland Kitchen atenolol (TENORMIN) 25 MG tablet Take 25 mg by mouth daily.   Marland Kitchen atorvastatin (LIPITOR) 40 MG tablet Take 40 mg by mouth daily before breakfast.   . Cyanocobalamin (VITAMIN B 12 PO) Take 1,000 mcg by mouth daily.  . ferrous sulfate 325 (65 FE) MG tablet Take 325 mg by mouth daily with breakfast.  . furosemide (LASIX) 20 MG tablet Take 20 mg by mouth daily.  Marland Kitchen HYDROmorphone (DILAUDID) 8 MG tablet Take 1 tablet (8 mg total) by  mouth every 4 (four) hours as needed for moderate pain or severe pain.  . hydrOXYzine (ATARAX/VISTARIL) 50 MG tablet Take 50-100 mg by mouth at bedtime.  . insulin regular (NOVOLIN R) 100 units/mL injection Inject 18 Units into the skin daily with lunch.  . lisinopril (ZESTRIL) 10 MG tablet Take 10 mg by mouth daily.   Marland Kitchen NOVOLIN 70/30 FLEXPEN (70-30) 100 UNIT/ML PEN Inject 60 Units into the skin in the morning and at bedtime.   . pantoprazole (PROTONIX) 40 MG tablet Take 40 mg by mouth 2 (two) times daily.   . QUEtiapine (SEROQUEL) 100 MG tablet 1-2 tablets at night as needed for severe insomnia  . RELION INSULIN SYRINGE 31G X 15/64" 1 ML MISC USE 1 TWICE DAILY  . selegiline (ELDEPRYL) 5 MG tablet TAKE 1 & 1/2 TABLETS BY MOUTH EVERY MORNING  . spironolactone (ALDACTONE) 50 MG tablet Take 50 mg by mouth daily.  . vitamin E 400 UNIT capsule Take 400 Units by mouth daily.     Allergies:   Canagliflozin, Cyclobenzaprine, Gabapentin, Tanzeum [albiglutide], Hydrocodone-acetaminophen, Iodinated diagnostic agents, Iohexol, Metformin and related, Nsaids, Other, and Vicodin [hydrocodone-acetaminophen]   Social History   Tobacco Use  . Smoking status: Former Smoker    Packs/day: 1.00    Years: 15.00    Pack years: 15.00    Quit date: 12/31/1985    Years since quitting: 34.3  . Smokeless tobacco: Never Used  Vaping Use  . Vaping Use: Never used  Substance Use Topics  . Alcohol use: No  . Drug use: No     Family Hx: The patient's family history includes Cancer in his mother.  ROS:   Please see the history of present illness.     All other systems reviewed and are negative.   Labs/Other Tests and Data Reviewed:    Recent Labs: 07/21/2019: ALT 29; BUN 11; Creatinine, Ser 0.85; Hemoglobin 12.6; Platelets 41; Potassium 4.3; Sodium 135   Recent Lipid Panel No results found for: CHOL, TRIG, HDL, CHOLHDL, LDLCALC, LDLDIRECT  Wt Readings from Last 3 Encounters:  04/13/20 297 lb 6.4 oz  (134.9 kg)  07/23/19 278 lb (126.1 kg)  07/21/19 289 lb 4 oz (131.2 kg)     Objective:    Vital Signs:  BP 128/70   Pulse 75   Ht 6' (1.829 m)   Wt 297 lb 6.4 oz (134.9 kg)   SpO2 96%   BMI 40.33 kg/m    GEN: Well nourished, well developed in no acute distress HEENT: Normal NECK: No JVD; No carotid bruits LYMPHATICS: No lymphadenopathy CARDIAC:RRR, no murmurs, rubs, gallops RESPIRATORY:  Clear to auscultation without rales, wheezing or rhonchi  ABDOMEN: Soft, non-tender, non-distended MUSCULOSKELETAL:  No edema; No deformity  SKIN: Warm and dry NEUROLOGIC:  Alert and oriented x 3 PSYCHIATRIC:  Normal affect    ASSESSMENT & PLAN:    1.  OSA -  The patient is tolerating PAP therapy well without any problems. The PAP download was reviewed today and showed an AHI of 0.4/hr on 7 cm H2O with 90% compliance in using more than 4 hours nightly.  The patient has been using and benefiting from PAP use and will continue to benefit from therapy.   2.  HTN -BP controlled on exam -continue Atenolol 25mg , spiro 50mg  daily and Lisinopril 10mg  daily -SCr stable at 0.95 and K+ 4.7  3.  Obesity -he cannot exercise due to back problems.  -I again have encouraged him to cut back on carbs and portions.    Medication Adjustments/Labs and Tests Ordered: Current medicines are reviewed at length with the patient today.  Concerns regarding medicines are outlined above.  Tests Ordered: No orders of the defined types were placed in this encounter.  Medication Changes: No orders of the defined types were placed in this encounter.   Disposition:  Follow up in 1 year(s)  Signed, Fransico Him, MD  04/13/2020 10:40 AM    [facility]

## 2020-04-13 NOTE — Patient Instructions (Addendum)
Medication Instructions:  Your physician recommends that you continue on your current medications as directed. Please refer to the Current Medication list given to you today. *If you need a refill on your cardiac medications before your next appointment, please call your pharmacy*   Lab Work: None If you have labs (blood work) drawn today and your tests are completely normal, you will receive your results only by: Marland Kitchen MyChart Message (if you have MyChart) OR . A paper copy in the mail If you have any lab test that is abnormal or we need to change your treatment, we will call you to review the results.   Testing/Procedures: None   Follow-Up: At [facility], you and your health needs are our priority.  As part of our continuing mission to provide you with exceptional heart care, we have created designated Provider Care Teams.  These Care Teams include your primary Cardiologist (physician) and Advanced Practice Providers (APPs -  Physician Assistants and Nurse Practitioners) who all work together to provide you with the care you need, when you need it.  We recommend signing up for the patient portal called "MyChart".  Sign up information is provided on this After Visit Summary.  MyChart is used to connect with patients for Virtual Visits (Telemedicine).  Patients are able to view lab/test results, encounter notes, upcoming appointments, etc.  Non-urgent messages can be sent to your provider as well.   To learn more about what you can do with MyChart, go to [email or web address].    Your next appointment:   1 year(s)  The format for your next appointment:   In Person  Provider:   Fransico Him, MD

## 2020-04-14 DIAGNOSIS — F32A Depression, unspecified: Secondary | ICD-10-CM | POA: Diagnosis not present

## 2020-04-14 DIAGNOSIS — I8510 Secondary esophageal varices without bleeding: Secondary | ICD-10-CM | POA: Diagnosis not present

## 2020-04-14 DIAGNOSIS — R161 Splenomegaly, not elsewhere classified: Secondary | ICD-10-CM | POA: Diagnosis not present

## 2020-04-14 DIAGNOSIS — E782 Mixed hyperlipidemia: Secondary | ICD-10-CM | POA: Diagnosis not present

## 2020-04-14 DIAGNOSIS — M5430 Sciatica, unspecified side: Secondary | ICD-10-CM | POA: Diagnosis not present

## 2020-04-14 DIAGNOSIS — G4733 Obstructive sleep apnea (adult) (pediatric): Secondary | ICD-10-CM | POA: Diagnosis not present

## 2020-04-14 DIAGNOSIS — K760 Fatty (change of) liver, not elsewhere classified: Secondary | ICD-10-CM | POA: Diagnosis not present

## 2020-04-14 DIAGNOSIS — B1910 Unspecified viral hepatitis B without hepatic coma: Secondary | ICD-10-CM | POA: Diagnosis not present

## 2020-04-14 DIAGNOSIS — E119 Type 2 diabetes mellitus without complications: Secondary | ICD-10-CM | POA: Diagnosis not present

## 2020-04-14 DIAGNOSIS — Z888 Allergy status to other drugs, medicaments and biological substances status: Secondary | ICD-10-CM | POA: Diagnosis not present

## 2020-04-14 DIAGNOSIS — E669 Obesity, unspecified: Secondary | ICD-10-CM | POA: Diagnosis not present

## 2020-04-14 DIAGNOSIS — R188 Other ascites: Secondary | ICD-10-CM | POA: Diagnosis not present

## 2020-04-14 DIAGNOSIS — K766 Portal hypertension: Secondary | ICD-10-CM | POA: Diagnosis not present

## 2020-04-14 DIAGNOSIS — Z85828 Personal history of other malignant neoplasm of skin: Secondary | ICD-10-CM | POA: Diagnosis not present

## 2020-04-14 DIAGNOSIS — Z9682 Presence of neurostimulator: Secondary | ICD-10-CM | POA: Diagnosis not present

## 2020-04-14 DIAGNOSIS — M19011 Primary osteoarthritis, right shoulder: Secondary | ICD-10-CM | POA: Diagnosis not present

## 2020-04-14 DIAGNOSIS — K3189 Other diseases of stomach and duodenum: Secondary | ICD-10-CM | POA: Diagnosis not present

## 2020-04-14 DIAGNOSIS — I10 Essential (primary) hypertension: Secondary | ICD-10-CM | POA: Diagnosis not present

## 2020-04-14 DIAGNOSIS — Z885 Allergy status to narcotic agent status: Secondary | ICD-10-CM | POA: Diagnosis not present

## 2020-04-14 DIAGNOSIS — Z6838 Body mass index (BMI) 38.0-38.9, adult: Secondary | ICD-10-CM | POA: Diagnosis not present

## 2020-04-14 DIAGNOSIS — Z9049 Acquired absence of other specified parts of digestive tract: Secondary | ICD-10-CM | POA: Diagnosis not present

## 2020-04-14 DIAGNOSIS — D696 Thrombocytopenia, unspecified: Secondary | ICD-10-CM | POA: Diagnosis not present

## 2020-04-14 DIAGNOSIS — Z881 Allergy status to other antibiotic agents status: Secondary | ICD-10-CM | POA: Diagnosis not present

## 2020-04-14 DIAGNOSIS — Z886 Allergy status to analgesic agent status: Secondary | ICD-10-CM | POA: Diagnosis not present

## 2020-04-14 DIAGNOSIS — I8500 Esophageal varices without bleeding: Secondary | ICD-10-CM | POA: Diagnosis not present

## 2020-04-14 DIAGNOSIS — K7460 Unspecified cirrhosis of liver: Secondary | ICD-10-CM | POA: Diagnosis not present

## 2020-04-14 DIAGNOSIS — Z87891 Personal history of nicotine dependence: Secondary | ICD-10-CM | POA: Diagnosis not present

## 2020-04-14 DIAGNOSIS — Z91041 Radiographic dye allergy status: Secondary | ICD-10-CM | POA: Diagnosis not present

## 2020-04-14 DIAGNOSIS — K219 Gastro-esophageal reflux disease without esophagitis: Secondary | ICD-10-CM | POA: Diagnosis not present

## 2020-04-14 DIAGNOSIS — F411 Generalized anxiety disorder: Secondary | ICD-10-CM | POA: Diagnosis not present

## 2020-04-26 ENCOUNTER — Encounter: Payer: Self-pay | Admitting: Psychiatry

## 2020-04-26 ENCOUNTER — Telehealth (INDEPENDENT_AMBULATORY_CARE_PROVIDER_SITE_OTHER): Payer: Medicare Other | Admitting: Psychiatry

## 2020-04-26 DIAGNOSIS — F331 Major depressive disorder, recurrent, moderate: Secondary | ICD-10-CM

## 2020-04-26 DIAGNOSIS — F411 Generalized anxiety disorder: Secondary | ICD-10-CM | POA: Diagnosis not present

## 2020-04-26 DIAGNOSIS — F4001 Agoraphobia with panic disorder: Secondary | ICD-10-CM

## 2020-04-26 DIAGNOSIS — F5105 Insomnia due to other mental disorder: Secondary | ICD-10-CM | POA: Diagnosis not present

## 2020-04-26 MED ORDER — SELEGILINE HCL 5 MG PO TABS: ORAL_TABLET | ORAL | 1 refills | Status: DC

## 2020-04-26 MED ORDER — ALPRAZOLAM 2 MG PO TABS: 2.0000 mg | ORAL_TABLET | Freq: Four times a day (QID) | ORAL | 5 refills | Status: DC

## 2020-04-26 MED ORDER — QUETIAPINE FUMARATE 200 MG PO TABS: 200.0000 mg | ORAL_TABLET | Freq: Every day | ORAL | 1 refills | Status: DC

## 2020-04-26 NOTE — Progress Notes (Signed)
Victor [name] [id number] April 18, 1960 60 y.o.  Virtual Visit via Telephone Note  I connected with pt by telephone and verified that I am speaking with the correct person using two identifiers.   I discussed the limitations, risks, security and privacy concerns of performing an evaluation and management service by telephone and the availability of in person appointments. I also discussed with the patient that there may be a patient responsible charge related to this service. The patient expressed understanding and agreed to proceed.  I discussed the assessment and treatment plan with the patient. The patient was provided an opportunity to ask questions and all were answered. The patient agreed with the plan and demonstrated an understanding of the instructions.   The patient was advised to call back or seek an in-person evaluation if the symptoms worsen or if the condition fails to improve as anticipated.  I provided 30 minutes of non-face-to-face time during this encounter. The call started at 915 and ended at 945. The patient was located at home and the provider was located office.   Subjective:   Patient ID:  Victor [name] is a 60 y.o. (DOB [birth year]) male.  Chief Complaint:  Chief Complaint  Patient presents with  . Follow-up  . Major depressive disorder, recurrent episode, moderate ([place])  . Anxiety  . Sleeping Problem    Depression        Associated symptoms include fatigue.  Associated symptoms include no decreased concentration and no suicidal ideas.  Past medical history includes anxiety.   Anxiety Patient reports no confusion, decreased concentration, nervous/anxious behavior, palpitations or suicidal ideas.     Victor [name] presents for follow-up of his psychiatric conditions today.  At visit Jul 04, 2018.  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  At  visit June 2020.  He has had problems with insomnia related to selegiline  apparently.  We reduced the dose to 7.5 mg and gave it all in the morning.  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  He had benefit from selegiline 10 mg and then 7.5 mg but SE insomnia.  We also added low-dose quetiapine at night to help with sleep and potentially with depression as well.   seen August 2020.  He ended up using hydroxyzine instead of quetiapine for sleep.  He was satisfied with his meds overall.  Anxiety and depression were improved but not resolved on selegiline 7.5 mg every morning.  No meds were changed. Off and on taking selegiline 10 mg am about 4 days weekly and other days 7.5 mg am.  It has clearly helped the depression which is under control.  Xanax helps the anxiety.  seen November 2020.  The following med decisions were made: Increase hydroxyzine 50 mg to 2 HS for sleep. Continue selegiline 7.5 mg each morning DT SE insomnia.  He had worsening symptoms of depression and anxiety when he reduced the dose to 5 mg daily.   04/29/19 appt without med changes and following noted: B-in-law died at 79 at [facility] with Covid.  Pt having problems with low platelets.  Further workup ongoing.  Sometimess feels funny in afternoon with 7.5 mg selegiline and will intermittently reduce the dose.  Still CO trouble with sleep. Drowsy if still in the afternoon. Still issues with sleep and then gets mad he can't go to sleep.  3-4 hours of sleep total.  No caffeine after lunch.  No napping.  Says quetiapine didn't help sleep  but is taking hydroxyzine 50 and asks to increase it. He dropped the selegiline to 7.5 mg but still had SE.  Then reduced to 5 mg and did OK for awhile but then gradually more depression with stressors.   Tolerated it fine this time.  It is helping.  Less anxious.  Has helped the depression until the other things occurred. Was doing fine until Covid.  Is high risk with workup for leukemia with low WBC.  That's working against him.  10/28/19 appt with  the following noted: They expect me to die.  Severe nonalcoholic cirrhosis.  Requiring ascites drainage.  Severe blood abnormalities.  Not ready to die.  Doctor said to get his affairs in order.  Trying to get wife ready and it's very hard.  Hard to handle.  No timeline on lifespan.  Doesn't expect to be eligible for liver transplant.   Has had 27 surgeries.  Wonders about blood cancer too bc platelet count is so low.  Esophageal varices required surgery. Has to take it one day at a time.  Wake at night thinking about it and having NM about it. No concerns about psych meds from medical perspective per the doctors.   Restrictive diet. Taking Xanax TID and feels like he needs another in the middle of the night.  It calms him way down but doesn't make him sleep.  Will have heart racing fear and the Xanax takes it down. Takes it 730, 12 noon and 6:30 and then needs another in middle of night. No energy due to illness. Plan: Increase alprazolam to 2 mg 4 times daily. Because hydroxyzine was not helpful he was encouraged to stop it.  04/26/20 appt noted: Don't go anywhere bc immune system.  Doing OK.   Says quetiapine went from $30 to $120 for  3 mos supplytaking 200 mg HS.  Sleep is better and pretty good overall.  To bed 11 and awaken 3 a No SE. nd 530 and up 6 and getting 6 hours which is good. Helped more than anything else. There is better overall mood and anxiety wise than in a long time.  Satisfied with meds and does not want changes.  Pt reports that mood is Anxious and Depressed and both are improved with the selegiline. Extra Xanax made a big difference with anxiety.  No napping.   Anxiety symptoms include: Excessive Worry,.  Pt reports that appetite is decreased. Pt reports that energy is improved and improved. Concentration is improved. Suicidal thoughts:  denied by patient. . Suicidal thoughts:  denied by patient.  No increase in pain meds usually.       No caffeine.   Uses CPAP except  lately because of having nose cancer surgery.  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 Gets tired and drowsy in the day without sleep.  No caffeine after 9 pm.    Past Psychiatric Medication Trials: Trazodone, Wellbutrin, mirtazapine, duloxetine, paroxetine, citalopram, Vivactil, selegiline (Emsam) for several years with good response until 2016  and stopped DT cost,   quetiapine 100, Xanax 2 mg at night then to 4 times daily, Sonata, Lunesta, Ambien, Rozerem, hydroxyzine hs NR  Review of Systems:  Review of Systems  Constitutional: Positive for fatigue.  Cardiovascular: Negative for palpitations.  Gastrointestinal: Negative for abdominal distention.  Musculoskeletal: Positive for back pain.  Neurological: Negative for tremors and weakness.  Psychiatric/Behavioral: Positive for depression. Negative for agitation, behavioral problems, confusion, decreased concentration, dysphoric mood, hallucinations, self-injury, sleep disturbance and suicidal ideas. The  patient is not nervous/anxious and is not hyperactive.     Medications: I have reviewed the patient's current medications.  Current Outpatient Medications  Medication Sig Dispense Refill  . atenolol (TENORMIN) 25 MG tablet Take 25 mg by mouth daily.     Marland Kitchen atorvastatin (LIPITOR) 40 MG tablet Take 40 mg by mouth daily before breakfast.     . Cyanocobalamin (VITAMIN B 12 PO) Take 1,000 mcg by mouth daily.    . ferrous sulfate 325 (65 FE) MG tablet Take 325 mg by mouth daily with breakfast.    . furosemide (LASIX) 20 MG tablet Take 20 mg by mouth daily.    Marland Kitchen HYDROmorphone (DILAUDID) 8 MG tablet Take 1 tablet (8 mg total) by mouth every 4 (four) hours as needed for moderate pain or severe pain. 40 tablet 0  . insulin regular (NOVOLIN R) 100 units/mL injection Inject 18 Units into the skin daily with lunch.    . lisinopril (ZESTRIL) 10 MG tablet Take 10 mg by mouth daily.   0  . NOVOLIN 70/30 FLEXPEN (70-30) 100 UNIT/ML PEN Inject 60 Units into the skin  in the morning and at bedtime.     . pantoprazole (PROTONIX) 40 MG tablet Take 40 mg by mouth 2 (two) times daily.   0  . spironolactone (ALDACTONE) 50 MG tablet Take 50 mg by mouth daily.    . vitamin E 400 UNIT capsule Take 400 Units by mouth daily.    Marland Kitchen alprazolam (XANAX) 2 MG tablet Take 1 tablet (2 mg total) by mouth in the morning, at noon, in the evening, and at bedtime. 120 tablet 5  . QUEtiapine (SEROQUEL) 200 MG tablet Take 1 tablet (200 mg total) by mouth at bedtime. 1-2 tablets at night as needed for severe insomnia 90 tablet 1  . RELION INSULIN SYRINGE 31G X 15/64" 1 ML MISC USE 1 TWICE DAILY (Patient not taking: Reported on 04/26/2020)    . selegiline (ELDEPRYL) 5 MG tablet TAKE 1 & 1/2 TABLETS BY MOUTH EVERY MORNING 135 tablet 1   No current facility-administered medications for this visit.   Facility-Administered Medications Ordered in Other Visits  Medication Dose Route Frequency Provider Last Rate Last Admin  . 0.9 %  sodium chloride infusion   Intravenous Once Ralene Ok, MD        Medication Side Effects: Insomnia  Allergies:  Allergies  Allergen Reactions  . Canagliflozin Itching and Other (See Comments)    Yeast infections  pancreatitis Yeast infections  pancreatitis Yeast infections  . Cyclobenzaprine Anaphylaxis and Other (See Comments)    REACTION:  Not compatible with Emsam patch---not taking this patch any longer   . Gabapentin Other (See Comments)    Other reaction(s): shortness of breath/tardive dyskinesia  . Tanzeum [Albiglutide] Other (See Comments)    PANCREATITIS  . Hydrocodone-Acetaminophen Hives    REACTION: pt turns red and has hot flashes   . Iodinated Diagnostic Agents Rash    1 EPISODE, RELIEVED WITH BENADRYL  . Iohexol Hives    After CT a/p. Gave him Benadryl 25mg  PO for fewer than 10 hives on face/neck.  No swelling or airway issues. Gypsy Lore, RN After CT a/p. Gave him Benadryl 25mg  PO for fewer than 10 hives on face/neck.  No  swelling or airway issues. Gypsy Lore, RN  . Metformin And Related Diarrhea  . Nsaids Diarrhea and Other (See Comments)    Other reaction(s): stomach upset Stomach Upset  Other reaction(s): stomach upset Stomach Upset Stomach  Upset  . Other Other (See Comments)  . Vicodin [Hydrocodone-Acetaminophen] Hives and Other (See Comments)    Other reaction(s): hives/itching (although he is able to tolerate Percocet) REACTION: pt turns red and has hot flashes     Past Medical History:  Diagnosis Date  . Anxiety    takes Xanax daily as needed  . Blood dyscrasia    thrombocytopenia   . Blood transfusion    platelets  . C. difficile colitis 03/2015  . Depression    Emsam patch daily  . Diabetes mellitus     Type II   . GERD (gastroesophageal reflux disease)    takes Protonix daily  . Hepatitis    hepatitis b/ newly diagnosed with portal hypertension  . Hepatitis B virus infection 03/2007  . History of kidney stones    passed- x 2  . History of shingles   . Hyperlipidemia    takes AtorvaStatin daily  . Hypertension    takes Atenolol and Lisinopril daily  . Obstructive sleep apnea (adult) (pediatric)    mild with AHI 11.61/hr now on CPAP at 15cm h2o, CPAP is used q night   . Osteoarthritis   . Pancreatitis   . Sciatica    lumbar region - 2010, also has had numerous injections   . Splenomegaly    LOV Dr Lamonte Sakai  12/12 on chart  . Thrombocytopenia ([place])     Family History  Problem Relation Age of Onset  . Cancer Mother     Social History   Socioeconomic History  . Marital status: Married    Spouse name: Not on file  . Number of children: Not on file  . Years of education: Not on file  . Highest education level: Not on file  Occupational History  . Not on file  Tobacco Use  . Smoking status: Former Smoker    Packs/day: 1.00    Years: 15.00    Pack years: 15.00    Quit date: 12/31/1985    Years since quitting: 34.3  . Smokeless tobacco: Never Used  Vaping Use  . Vaping  Use: Never used  Substance and Sexual Activity  . Alcohol use: No  . Drug use: No  . Sexual activity: Not on file  Other Topics Concern  . Not on file  Social History Narrative  . Not on file   Social Determinants of Health   Financial Resource Strain: Not on file  Food Insecurity: Not on file  Transportation Needs: Not on file  Physical Activity: Not on file  Stress: Not on file  Social Connections: Not on file  Intimate Partner Violence: Not on file    Past Medical History, Surgical history, Social history, and Family history were reviewed and updated as appropriate.   Please see review of systems for further details on the patient's review from today.   Objective:   Physical Exam:  There were no vitals taken for this visit.  Physical Exam Neurological:     Mental Status: He is alert and oriented to person, place, and time.     Cranial Nerves: No dysarthria.  Psychiatric:        Attention and Perception: Attention and perception normal.        Mood and Affect: Mood is anxious. Mood is not depressed.        Speech: Speech normal.        Behavior: Behavior is cooperative.        Thought Content: Thought content normal. Thought content  is not paranoid or delusional. Thought content does not include homicidal or suicidal ideation. Thought content does not include homicidal or suicidal plan.        Cognition and Memory: Cognition and memory normal.        Judgment: Judgment normal.     Comments: Insight intact Depression mostly situational and not impairing.  Anxiety is improved with increase alprazolam     Lab Review:     Component Value Date/Time   NA 135 07/21/2019 1125   K 4.3 07/21/2019 1125   CL 103 07/21/2019 1125   CO2 26 07/21/2019 1125   GLUCOSE 195 (H) 07/21/2019 1125   BUN 11 07/21/2019 1125   CREATININE 0.85 07/21/2019 1125   CALCIUM 8.9 07/21/2019 1125   PROT 6.2 (L) 07/21/2019 1125   ALBUMIN 3.0 (L) 07/21/2019 1125   AST 27 07/21/2019 1125    ALT 29 07/21/2019 1125   ALKPHOS 84 07/21/2019 1125   BILITOT 1.1 07/21/2019 1125   GFRNONAA >60 07/21/2019 1125   GFRAA >60 07/21/2019 1125       Component Value Date/Time   WBC 3.2 (L) 07/21/2019 1125   RBC 4.28 07/21/2019 1125   HGB 12.6 (L) 07/21/2019 1125   HGB 13.7 01/22/2011 1348   HCT 37.9 (L) 07/21/2019 1125   HCT 40.1 01/22/2011 1348   PLT 41 (L) 07/21/2019 1125   PLT 59 (L) 01/22/2011 1348   MCV 88.6 07/21/2019 1125   MCV 87.9 01/22/2011 1348   MCH 29.4 07/21/2019 1125   MCHC 33.2 07/21/2019 1125   RDW 14.8 07/21/2019 1125   RDW 13.9 01/22/2011 1348   LYMPHSABS 1.2 04/22/2015 1423   LYMPHSABS 1.1 01/22/2011 1348   MONOABS 0.3 04/22/2015 1423   MONOABS 0.3 01/22/2011 1348   EOSABS 0.1 04/22/2015 1423   EOSABS 0.1 01/22/2011 1348   BASOSABS 0.0 04/22/2015 1423   BASOSABS 0.0 01/22/2011 1348    No results found for: POCLITH, LITHIUM   No results found for: PHENYTOIN, PHENOBARB, VALPROATE, CBMZ   .res Assessment: Plan:    Kenric was seen today for follow-up, major depressive disorder, recurrent episode, moderate ([facility]), anxiety and sleeping problem.  Diagnoses and all orders for this visit:  Major depressive disorder, recurrent episode, moderate ([facility]) -     selegiline (ELDEPRYL) 5 MG tablet; TAKE 1 & 1/2 TABLETS BY MOUTH EVERY MORNING  Panic disorder with agoraphobia -     alprazolam (XANAX) 2 MG tablet; Take 1 tablet (2 mg total) by mouth in the morning, at noon, in the evening, and at bedtime.  Generalized anxiety disorder -     alprazolam (XANAX) 2 MG tablet; Take 1 tablet (2 mg total) by mouth in the morning, at noon, in the evening, and at bedtime.  Insomnia due to mental condition -     QUEtiapine (SEROQUEL) 200 MG tablet; Take 1 tablet (200 mg total) by mouth at bedtime. 1-2 tablets at night as needed for severe insomnia -     alprazolam (XANAX) 2 MG tablet; Take 1 tablet (2 mg total) by mouth in the morning, at noon, in the evening, and at  bedtime.   Greater than 50% of 30 min face to face time with patient was spent on counseling and coordination of care. We discussed Patient with a long history of recurrent major depression panic disorder generalized anxiety disorder and insomnia.  He had a good response to selegiline over a period of number of years but stopped it in 2017 due to expense  of the Emsam patch.  .  He is failed multiple other SSRIs and SNRIs.  His depression has generally responded to oral selegiline.  His anxiety is improved with the increase in alprazolam to the maximum 2 mg 4 times daily.  He is tolerating it well.    Panic and anxiety symptoms are pretty well controlled at present.  We used the selegiline tablets in place of the patch as it is more affordable.  He started selegiline 5 mg half twice daily for 3 days then 1 twice dail.  We discussed side effects in detail including MAO inhibitors restrictions regarding medication interactions.  Unfortunately he had significant problems with insomnia which is a known side effect possibility and some tension.   However his depression and energy are improved and he's satisfied with meds. Insomnia was a problem and he's not sure if it's worse on selegiline 10 mg selegiline vs 7.5 mg AM.    Insomnia resolved with quetiapine 200 mg nightly.  We discussed the use of good Rx as a way to get around high cost of quetiapine with his insurance.  Continue alprazolam to 2 mg 4 times daily.He is aware that this is a high dose and of asked him not to take other sedatives at night.  We discussed the fall risk and that given his low platelet count if he has a fall he is at increased risk of complications of bleeding.  He understands this risk.  He states it is not overly sedating to him when he takes it during the day.  Continue selegiline 7.5 mg each morning DT SE insomnia.  He had worsening symptoms of depression and anxiety when he reduced the dose to 5 mg daily.  Discussed  potential metabolic side effects associated with atypical antipsychotics, as well as potential risk for movement side effects. Advised pt to contact office if movement side effects occur.   We discussed the short-term risks associated with benzodiazepines including sedation and increased fall risk among others.  Discussed long-term side effect risk including dependence, potential withdrawal symptoms, and the potential eventual dose-related risk of dementia.  Again discussed MAO inhibitor restrictions and especially drug interaction issues.  This appt was 30 mins.  FU 6 months  Lynder Parents, MD, DFAPA    Please see After Visit Summary for patient specific instructions.  No future appointments.  No orders of the defined types were placed in this encounter.     -------------------------------

## 2020-04-27 DIAGNOSIS — R188 Other ascites: Secondary | ICD-10-CM | POA: Diagnosis not present

## 2020-04-27 DIAGNOSIS — K7460 Unspecified cirrhosis of liver: Secondary | ICD-10-CM | POA: Diagnosis not present

## 2020-05-02 DIAGNOSIS — K7460 Unspecified cirrhosis of liver: Secondary | ICD-10-CM | POA: Diagnosis not present

## 2020-05-02 DIAGNOSIS — G4733 Obstructive sleep apnea (adult) (pediatric): Secondary | ICD-10-CM | POA: Diagnosis not present

## 2020-05-02 DIAGNOSIS — E785 Hyperlipidemia, unspecified: Secondary | ICD-10-CM | POA: Diagnosis not present

## 2020-05-02 DIAGNOSIS — E611 Iron deficiency: Secondary | ICD-10-CM | POA: Diagnosis not present

## 2020-05-02 DIAGNOSIS — Z9989 Dependence on other enabling machines and devices: Secondary | ICD-10-CM | POA: Diagnosis not present

## 2020-05-02 DIAGNOSIS — I10 Essential (primary) hypertension: Secondary | ICD-10-CM | POA: Diagnosis not present

## 2020-05-02 DIAGNOSIS — D696 Thrombocytopenia, unspecified: Secondary | ICD-10-CM | POA: Diagnosis not present

## 2020-05-02 DIAGNOSIS — D72819 Decreased white blood cell count, unspecified: Secondary | ICD-10-CM | POA: Diagnosis not present

## 2020-05-02 DIAGNOSIS — E1169 Type 2 diabetes mellitus with other specified complication: Secondary | ICD-10-CM | POA: Diagnosis not present

## 2020-05-02 DIAGNOSIS — E538 Deficiency of other specified B group vitamins: Secondary | ICD-10-CM | POA: Diagnosis not present

## 2020-05-02 DIAGNOSIS — Z79899 Other long term (current) drug therapy: Secondary | ICD-10-CM | POA: Diagnosis not present

## 2020-05-18 DIAGNOSIS — E1169 Type 2 diabetes mellitus with other specified complication: Secondary | ICD-10-CM | POA: Diagnosis not present

## 2020-05-18 DIAGNOSIS — E785 Hyperlipidemia, unspecified: Secondary | ICD-10-CM | POA: Diagnosis not present

## 2020-05-18 DIAGNOSIS — E1165 Type 2 diabetes mellitus with hyperglycemia: Secondary | ICD-10-CM | POA: Diagnosis not present

## 2020-05-18 DIAGNOSIS — K7469 Other cirrhosis of liver: Secondary | ICD-10-CM | POA: Diagnosis not present

## 2020-05-18 DIAGNOSIS — I152 Hypertension secondary to endocrine disorders: Secondary | ICD-10-CM | POA: Diagnosis not present

## 2020-05-18 DIAGNOSIS — K760 Fatty (change of) liver, not elsewhere classified: Secondary | ICD-10-CM | POA: Diagnosis not present

## 2020-05-18 DIAGNOSIS — N521 Erectile dysfunction due to diseases classified elsewhere: Secondary | ICD-10-CM | POA: Diagnosis not present

## 2020-05-18 DIAGNOSIS — E1159 Type 2 diabetes mellitus with other circulatory complications: Secondary | ICD-10-CM | POA: Diagnosis not present

## 2020-05-18 DIAGNOSIS — Z5181 Encounter for therapeutic drug level monitoring: Secondary | ICD-10-CM | POA: Diagnosis not present

## 2020-05-18 DIAGNOSIS — E1140 Type 2 diabetes mellitus with diabetic neuropathy, unspecified: Secondary | ICD-10-CM | POA: Diagnosis not present

## 2020-05-18 DIAGNOSIS — E669 Obesity, unspecified: Secondary | ICD-10-CM | POA: Diagnosis not present

## 2020-05-18 DIAGNOSIS — Z794 Long term (current) use of insulin: Secondary | ICD-10-CM | POA: Diagnosis not present

## 2020-05-27 DIAGNOSIS — M1712 Unilateral primary osteoarthritis, left knee: Secondary | ICD-10-CM | POA: Diagnosis not present

## 2020-06-01 DIAGNOSIS — H401131 Primary open-angle glaucoma, bilateral, mild stage: Secondary | ICD-10-CM | POA: Diagnosis not present

## 2020-06-29 DIAGNOSIS — K580 Irritable bowel syndrome with diarrhea: Secondary | ICD-10-CM | POA: Diagnosis not present

## 2020-06-29 DIAGNOSIS — K766 Portal hypertension: Secondary | ICD-10-CM | POA: Diagnosis not present

## 2020-06-29 DIAGNOSIS — R188 Other ascites: Secondary | ICD-10-CM | POA: Diagnosis not present

## 2020-06-29 DIAGNOSIS — K7460 Unspecified cirrhosis of liver: Secondary | ICD-10-CM | POA: Diagnosis not present

## 2020-06-29 DIAGNOSIS — I8510 Secondary esophageal varices without bleeding: Secondary | ICD-10-CM | POA: Diagnosis not present

## 2020-06-30 DIAGNOSIS — M961 Postlaminectomy syndrome, not elsewhere classified: Secondary | ICD-10-CM | POA: Diagnosis not present

## 2020-06-30 DIAGNOSIS — M25562 Pain in left knee: Secondary | ICD-10-CM | POA: Diagnosis not present

## 2020-07-13 DIAGNOSIS — Z9049 Acquired absence of other specified parts of digestive tract: Secondary | ICD-10-CM | POA: Diagnosis not present

## 2020-07-13 DIAGNOSIS — K7460 Unspecified cirrhosis of liver: Secondary | ICD-10-CM | POA: Diagnosis not present

## 2020-07-14 DIAGNOSIS — D696 Thrombocytopenia, unspecified: Secondary | ICD-10-CM | POA: Diagnosis not present

## 2020-07-14 DIAGNOSIS — M1712 Unilateral primary osteoarthritis, left knee: Secondary | ICD-10-CM | POA: Diagnosis not present

## 2020-07-14 DIAGNOSIS — G894 Chronic pain syndrome: Secondary | ICD-10-CM | POA: Diagnosis not present

## 2020-07-14 DIAGNOSIS — E1169 Type 2 diabetes mellitus with other specified complication: Secondary | ICD-10-CM | POA: Diagnosis not present

## 2020-07-14 DIAGNOSIS — E785 Hyperlipidemia, unspecified: Secondary | ICD-10-CM | POA: Diagnosis not present

## 2020-07-14 DIAGNOSIS — M25562 Pain in left knee: Secondary | ICD-10-CM | POA: Diagnosis not present

## 2020-07-14 DIAGNOSIS — G8929 Other chronic pain: Secondary | ICD-10-CM | POA: Diagnosis not present

## 2020-07-14 DIAGNOSIS — E669 Obesity, unspecified: Secondary | ICD-10-CM | POA: Diagnosis not present

## 2020-08-10 DIAGNOSIS — Z23 Encounter for immunization: Secondary | ICD-10-CM | POA: Diagnosis not present

## 2020-08-17 DIAGNOSIS — E11649 Type 2 diabetes mellitus with hypoglycemia without coma: Secondary | ICD-10-CM | POA: Diagnosis not present

## 2020-08-17 DIAGNOSIS — E1165 Type 2 diabetes mellitus with hyperglycemia: Secondary | ICD-10-CM | POA: Diagnosis not present

## 2020-08-17 DIAGNOSIS — M1712 Unilateral primary osteoarthritis, left knee: Secondary | ICD-10-CM | POA: Diagnosis not present

## 2020-08-17 DIAGNOSIS — N521 Erectile dysfunction due to diseases classified elsewhere: Secondary | ICD-10-CM | POA: Diagnosis not present

## 2020-08-17 DIAGNOSIS — E1140 Type 2 diabetes mellitus with diabetic neuropathy, unspecified: Secondary | ICD-10-CM | POA: Diagnosis not present

## 2020-08-17 DIAGNOSIS — E1159 Type 2 diabetes mellitus with other circulatory complications: Secondary | ICD-10-CM | POA: Diagnosis not present

## 2020-08-17 DIAGNOSIS — K760 Fatty (change of) liver, not elsewhere classified: Secondary | ICD-10-CM | POA: Diagnosis not present

## 2020-08-17 DIAGNOSIS — I152 Hypertension secondary to endocrine disorders: Secondary | ICD-10-CM | POA: Diagnosis not present

## 2020-08-17 DIAGNOSIS — E785 Hyperlipidemia, unspecified: Secondary | ICD-10-CM | POA: Diagnosis not present

## 2020-08-17 DIAGNOSIS — E1169 Type 2 diabetes mellitus with other specified complication: Secondary | ICD-10-CM | POA: Diagnosis not present

## 2020-08-17 DIAGNOSIS — Z794 Long term (current) use of insulin: Secondary | ICD-10-CM | POA: Diagnosis not present

## 2020-08-17 DIAGNOSIS — K7469 Other cirrhosis of liver: Secondary | ICD-10-CM | POA: Diagnosis not present

## 2020-09-09 DIAGNOSIS — E782 Mixed hyperlipidemia: Secondary | ICD-10-CM | POA: Diagnosis not present

## 2020-09-09 DIAGNOSIS — M1712 Unilateral primary osteoarthritis, left knee: Secondary | ICD-10-CM | POA: Diagnosis not present

## 2020-09-09 DIAGNOSIS — Z794 Long term (current) use of insulin: Secondary | ICD-10-CM | POA: Diagnosis not present

## 2020-09-09 DIAGNOSIS — E1169 Type 2 diabetes mellitus with other specified complication: Secondary | ICD-10-CM | POA: Diagnosis not present

## 2020-09-09 DIAGNOSIS — I10 Essential (primary) hypertension: Secondary | ICD-10-CM | POA: Diagnosis not present

## 2020-09-09 DIAGNOSIS — M545 Low back pain, unspecified: Secondary | ICD-10-CM | POA: Diagnosis not present

## 2020-09-09 DIAGNOSIS — D696 Thrombocytopenia, unspecified: Secondary | ICD-10-CM | POA: Diagnosis not present

## 2020-09-09 DIAGNOSIS — K219 Gastro-esophageal reflux disease without esophagitis: Secondary | ICD-10-CM | POA: Diagnosis not present

## 2020-09-09 DIAGNOSIS — K7460 Unspecified cirrhosis of liver: Secondary | ICD-10-CM | POA: Diagnosis not present

## 2020-09-09 DIAGNOSIS — F411 Generalized anxiety disorder: Secondary | ICD-10-CM | POA: Diagnosis not present

## 2020-09-09 DIAGNOSIS — G4700 Insomnia, unspecified: Secondary | ICD-10-CM | POA: Diagnosis not present

## 2020-09-14 DIAGNOSIS — G4733 Obstructive sleep apnea (adult) (pediatric): Secondary | ICD-10-CM | POA: Diagnosis not present

## 2020-09-14 DIAGNOSIS — I8500 Esophageal varices without bleeding: Secondary | ICD-10-CM | POA: Diagnosis not present

## 2020-09-14 DIAGNOSIS — F329 Major depressive disorder, single episode, unspecified: Secondary | ICD-10-CM | POA: Diagnosis not present

## 2020-09-14 DIAGNOSIS — G894 Chronic pain syndrome: Secondary | ICD-10-CM | POA: Diagnosis not present

## 2020-09-14 DIAGNOSIS — M1712 Unilateral primary osteoarthritis, left knee: Secondary | ICD-10-CM | POA: Diagnosis not present

## 2020-09-14 DIAGNOSIS — K7460 Unspecified cirrhosis of liver: Secondary | ICD-10-CM | POA: Diagnosis not present

## 2020-09-14 DIAGNOSIS — R600 Localized edema: Secondary | ICD-10-CM | POA: Diagnosis not present

## 2020-09-14 DIAGNOSIS — D696 Thrombocytopenia, unspecified: Secondary | ICD-10-CM | POA: Diagnosis not present

## 2020-09-14 DIAGNOSIS — D509 Iron deficiency anemia, unspecified: Secondary | ICD-10-CM | POA: Diagnosis not present

## 2020-09-14 DIAGNOSIS — Z01818 Encounter for other preprocedural examination: Secondary | ICD-10-CM | POA: Diagnosis not present

## 2020-09-14 DIAGNOSIS — I10 Essential (primary) hypertension: Secondary | ICD-10-CM | POA: Diagnosis not present

## 2020-09-14 DIAGNOSIS — Z794 Long term (current) use of insulin: Secondary | ICD-10-CM | POA: Diagnosis not present

## 2020-09-14 DIAGNOSIS — E119 Type 2 diabetes mellitus without complications: Secondary | ICD-10-CM | POA: Diagnosis not present

## 2020-09-26 DIAGNOSIS — F411 Generalized anxiety disorder: Secondary | ICD-10-CM | POA: Diagnosis not present

## 2020-09-26 DIAGNOSIS — G4733 Obstructive sleep apnea (adult) (pediatric): Secondary | ICD-10-CM | POA: Diagnosis not present

## 2020-09-26 DIAGNOSIS — R339 Retention of urine, unspecified: Secondary | ICD-10-CM | POA: Diagnosis not present

## 2020-09-26 DIAGNOSIS — D61818 Other pancytopenia: Secondary | ICD-10-CM | POA: Diagnosis not present

## 2020-09-26 DIAGNOSIS — E119 Type 2 diabetes mellitus without complications: Secondary | ICD-10-CM | POA: Diagnosis not present

## 2020-09-26 DIAGNOSIS — R4182 Altered mental status, unspecified: Secondary | ICD-10-CM | POA: Diagnosis not present

## 2020-09-26 DIAGNOSIS — K7460 Unspecified cirrhosis of liver: Secondary | ICD-10-CM | POA: Diagnosis not present

## 2020-09-26 DIAGNOSIS — K219 Gastro-esophageal reflux disease without esophagitis: Secondary | ICD-10-CM | POA: Diagnosis not present

## 2020-09-26 DIAGNOSIS — Z9889 Other specified postprocedural states: Secondary | ICD-10-CM | POA: Diagnosis not present

## 2020-09-26 DIAGNOSIS — G894 Chronic pain syndrome: Secondary | ICD-10-CM | POA: Diagnosis not present

## 2020-09-26 DIAGNOSIS — E875 Hyperkalemia: Secondary | ICD-10-CM | POA: Diagnosis not present

## 2020-09-26 DIAGNOSIS — D649 Anemia, unspecified: Secondary | ICD-10-CM | POA: Diagnosis not present

## 2020-09-26 DIAGNOSIS — Z87891 Personal history of nicotine dependence: Secondary | ICD-10-CM | POA: Diagnosis not present

## 2020-09-26 DIAGNOSIS — D696 Thrombocytopenia, unspecified: Secondary | ICD-10-CM | POA: Diagnosis not present

## 2020-09-26 DIAGNOSIS — Z96652 Presence of left artificial knee joint: Secondary | ICD-10-CM | POA: Diagnosis not present

## 2020-09-26 DIAGNOSIS — R0902 Hypoxemia: Secondary | ICD-10-CM | POA: Diagnosis not present

## 2020-09-26 DIAGNOSIS — F32A Depression, unspecified: Secondary | ICD-10-CM | POA: Diagnosis not present

## 2020-09-26 DIAGNOSIS — D62 Acute posthemorrhagic anemia: Secondary | ICD-10-CM | POA: Diagnosis not present

## 2020-09-26 DIAGNOSIS — K760 Fatty (change of) liver, not elsewhere classified: Secondary | ICD-10-CM | POA: Diagnosis not present

## 2020-09-26 DIAGNOSIS — I959 Hypotension, unspecified: Secondary | ICD-10-CM | POA: Diagnosis not present

## 2020-09-26 DIAGNOSIS — R000 Tachycardia, unspecified: Secondary | ICD-10-CM | POA: Diagnosis not present

## 2020-09-26 DIAGNOSIS — R188 Other ascites: Secondary | ICD-10-CM | POA: Diagnosis not present

## 2020-09-26 DIAGNOSIS — I8500 Esophageal varices without bleeding: Secondary | ICD-10-CM | POA: Diagnosis not present

## 2020-09-26 DIAGNOSIS — E782 Mixed hyperlipidemia: Secondary | ICD-10-CM | POA: Diagnosis not present

## 2020-09-26 DIAGNOSIS — M1712 Unilateral primary osteoarthritis, left knee: Secondary | ICD-10-CM | POA: Diagnosis not present

## 2020-09-26 DIAGNOSIS — E871 Hypo-osmolality and hyponatremia: Secondary | ICD-10-CM | POA: Diagnosis not present

## 2020-09-26 DIAGNOSIS — I10 Essential (primary) hypertension: Secondary | ICD-10-CM | POA: Diagnosis not present

## 2020-09-26 DIAGNOSIS — N179 Acute kidney failure, unspecified: Secondary | ICD-10-CM | POA: Diagnosis not present

## 2020-09-26 DIAGNOSIS — Z471 Aftercare following joint replacement surgery: Secondary | ICD-10-CM | POA: Diagnosis not present

## 2020-09-26 DIAGNOSIS — Z6841 Body Mass Index (BMI) 40.0 and over, adult: Secondary | ICD-10-CM | POA: Diagnosis not present

## 2020-09-26 DIAGNOSIS — E1169 Type 2 diabetes mellitus with other specified complication: Secondary | ICD-10-CM | POA: Diagnosis not present

## 2020-09-28 DIAGNOSIS — K219 Gastro-esophageal reflux disease without esophagitis: Secondary | ICD-10-CM | POA: Diagnosis not present

## 2020-09-28 DIAGNOSIS — Z471 Aftercare following joint replacement surgery: Secondary | ICD-10-CM | POA: Diagnosis not present

## 2020-09-28 DIAGNOSIS — Z87891 Personal history of nicotine dependence: Secondary | ICD-10-CM | POA: Diagnosis not present

## 2020-09-28 DIAGNOSIS — E119 Type 2 diabetes mellitus without complications: Secondary | ICD-10-CM | POA: Diagnosis not present

## 2020-09-28 DIAGNOSIS — M1712 Unilateral primary osteoarthritis, left knee: Secondary | ICD-10-CM | POA: Diagnosis not present

## 2020-09-28 DIAGNOSIS — Z96652 Presence of left artificial knee joint: Secondary | ICD-10-CM | POA: Diagnosis not present

## 2020-09-28 DIAGNOSIS — G4733 Obstructive sleep apnea (adult) (pediatric): Secondary | ICD-10-CM | POA: Diagnosis not present

## 2020-09-28 DIAGNOSIS — Z6841 Body Mass Index (BMI) 40.0 and over, adult: Secondary | ICD-10-CM | POA: Diagnosis not present

## 2020-09-28 DIAGNOSIS — I10 Essential (primary) hypertension: Secondary | ICD-10-CM | POA: Diagnosis not present

## 2020-10-03 DIAGNOSIS — R339 Retention of urine, unspecified: Secondary | ICD-10-CM | POA: Diagnosis not present

## 2020-10-03 DIAGNOSIS — Z466 Encounter for fitting and adjustment of urinary device: Secondary | ICD-10-CM | POA: Diagnosis not present

## 2020-10-04 DIAGNOSIS — Z794 Long term (current) use of insulin: Secondary | ICD-10-CM | POA: Diagnosis not present

## 2020-10-04 DIAGNOSIS — I10 Essential (primary) hypertension: Secondary | ICD-10-CM | POA: Diagnosis not present

## 2020-10-04 DIAGNOSIS — F411 Generalized anxiety disorder: Secondary | ICD-10-CM | POA: Diagnosis not present

## 2020-10-04 DIAGNOSIS — E1169 Type 2 diabetes mellitus with other specified complication: Secondary | ICD-10-CM | POA: Diagnosis not present

## 2020-10-04 DIAGNOSIS — E782 Mixed hyperlipidemia: Secondary | ICD-10-CM | POA: Diagnosis not present

## 2020-10-04 DIAGNOSIS — Z96652 Presence of left artificial knee joint: Secondary | ICD-10-CM | POA: Diagnosis not present

## 2020-10-04 DIAGNOSIS — G4733 Obstructive sleep apnea (adult) (pediatric): Secondary | ICD-10-CM | POA: Diagnosis not present

## 2020-10-04 DIAGNOSIS — K7460 Unspecified cirrhosis of liver: Secondary | ICD-10-CM | POA: Diagnosis not present

## 2020-10-04 DIAGNOSIS — D696 Thrombocytopenia, unspecified: Secondary | ICD-10-CM | POA: Diagnosis not present

## 2020-10-04 DIAGNOSIS — R5381 Other malaise: Secondary | ICD-10-CM | POA: Diagnosis not present

## 2020-10-05 DIAGNOSIS — Z85828 Personal history of other malignant neoplasm of skin: Secondary | ICD-10-CM | POA: Diagnosis not present

## 2020-10-05 DIAGNOSIS — Z471 Aftercare following joint replacement surgery: Secondary | ICD-10-CM | POA: Diagnosis not present

## 2020-10-05 DIAGNOSIS — E1169 Type 2 diabetes mellitus with other specified complication: Secondary | ICD-10-CM | POA: Diagnosis not present

## 2020-10-05 DIAGNOSIS — K219 Gastro-esophageal reflux disease without esophagitis: Secondary | ICD-10-CM | POA: Diagnosis not present

## 2020-10-05 DIAGNOSIS — E782 Mixed hyperlipidemia: Secondary | ICD-10-CM | POA: Diagnosis not present

## 2020-10-05 DIAGNOSIS — Z9181 History of falling: Secondary | ICD-10-CM | POA: Diagnosis not present

## 2020-10-05 DIAGNOSIS — Z794 Long term (current) use of insulin: Secondary | ICD-10-CM | POA: Diagnosis not present

## 2020-10-05 DIAGNOSIS — F411 Generalized anxiety disorder: Secondary | ICD-10-CM | POA: Diagnosis not present

## 2020-10-05 DIAGNOSIS — G894 Chronic pain syndrome: Secondary | ICD-10-CM | POA: Diagnosis not present

## 2020-10-05 DIAGNOSIS — G4733 Obstructive sleep apnea (adult) (pediatric): Secondary | ICD-10-CM | POA: Diagnosis not present

## 2020-10-05 DIAGNOSIS — B1910 Unspecified viral hepatitis B without hepatic coma: Secondary | ICD-10-CM | POA: Diagnosis not present

## 2020-10-05 DIAGNOSIS — G4700 Insomnia, unspecified: Secondary | ICD-10-CM | POA: Diagnosis not present

## 2020-10-05 DIAGNOSIS — Z8719 Personal history of other diseases of the digestive system: Secondary | ICD-10-CM | POA: Diagnosis not present

## 2020-10-05 DIAGNOSIS — Z87891 Personal history of nicotine dependence: Secondary | ICD-10-CM | POA: Diagnosis not present

## 2020-10-05 DIAGNOSIS — D696 Thrombocytopenia, unspecified: Secondary | ICD-10-CM | POA: Diagnosis not present

## 2020-10-05 DIAGNOSIS — M5440 Lumbago with sciatica, unspecified side: Secondary | ICD-10-CM | POA: Diagnosis not present

## 2020-10-05 DIAGNOSIS — Z7984 Long term (current) use of oral hypoglycemic drugs: Secondary | ICD-10-CM | POA: Diagnosis not present

## 2020-10-05 DIAGNOSIS — K760 Fatty (change of) liver, not elsewhere classified: Secondary | ICD-10-CM | POA: Diagnosis not present

## 2020-10-05 DIAGNOSIS — F32A Depression, unspecified: Secondary | ICD-10-CM | POA: Diagnosis not present

## 2020-10-05 DIAGNOSIS — I10 Essential (primary) hypertension: Secondary | ICD-10-CM | POA: Diagnosis not present

## 2020-10-05 DIAGNOSIS — M961 Postlaminectomy syndrome, not elsewhere classified: Secondary | ICD-10-CM | POA: Diagnosis not present

## 2020-10-05 DIAGNOSIS — Z96652 Presence of left artificial knee joint: Secondary | ICD-10-CM | POA: Diagnosis not present

## 2020-10-05 DIAGNOSIS — K858 Other acute pancreatitis without necrosis or infection: Secondary | ICD-10-CM | POA: Diagnosis not present

## 2020-10-11 ENCOUNTER — Other Ambulatory Visit: Payer: Self-pay | Admitting: Psychiatry

## 2020-10-11 DIAGNOSIS — F5105 Insomnia due to other mental disorder: Secondary | ICD-10-CM

## 2020-10-13 ENCOUNTER — Other Ambulatory Visit: Payer: Self-pay

## 2020-10-13 ENCOUNTER — Ambulatory Visit (INDEPENDENT_AMBULATORY_CARE_PROVIDER_SITE_OTHER): Payer: Medicare Other | Admitting: Psychiatry

## 2020-10-13 ENCOUNTER — Encounter: Payer: Self-pay | Admitting: Psychiatry

## 2020-10-13 DIAGNOSIS — F331 Major depressive disorder, recurrent, moderate: Secondary | ICD-10-CM

## 2020-10-13 DIAGNOSIS — F5105 Insomnia due to other mental disorder: Secondary | ICD-10-CM

## 2020-10-13 DIAGNOSIS — G894 Chronic pain syndrome: Secondary | ICD-10-CM | POA: Diagnosis not present

## 2020-10-13 DIAGNOSIS — F411 Generalized anxiety disorder: Secondary | ICD-10-CM

## 2020-10-13 DIAGNOSIS — Z79899 Other long term (current) drug therapy: Secondary | ICD-10-CM | POA: Diagnosis not present

## 2020-10-13 DIAGNOSIS — F4001 Agoraphobia with panic disorder: Secondary | ICD-10-CM

## 2020-10-13 MED ORDER — SELEGILINE HCL 5 MG PO TABS: ORAL_TABLET | ORAL | 1 refills | Status: DC

## 2020-10-13 MED ORDER — QUETIAPINE FUMARATE 200 MG PO TABS: ORAL_TABLET | ORAL | 0 refills | Status: DC

## 2020-10-13 MED ORDER — ALPRAZOLAM 2 MG PO TABS
2.0000 mg | ORAL_TABLET | Freq: Four times a day (QID) | ORAL | 5 refills | Status: DC
Start: 2020-10-13 — End: 2021-04-13

## 2020-10-13 NOTE — Progress Notes (Signed)
[name]:[id number] 06-19-1960 60 y.o.   Subjective:   Patient ID:  Victor [name] is a 60 y.o. (DOB January 08, 1961) male.  Chief Complaint:  Chief Complaint  Patient presents with   Follow-up   Depression   Anxiety   Sleeping Problem    Depression        Associated symptoms include fatigue.  Associated symptoms include no decreased concentration and no suicidal ideas.  Past medical history includes anxiety.   Anxiety Patient reports no confusion, decreased concentration, nervous/anxious behavior, palpitations or suicidal ideas.    [name] presents for follow-up of his psychiatric conditions today.  At visit Jul 04, 2018.  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  At  visit June 2020.  He has had problems with insomnia related to selegiline apparently.  We reduced the dose to 7.5 mg and gave it all in the morning.  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  He had benefit from selegiline 10 mg and then 7.5 mg but SE insomnia.  We also added low-dose quetiapine at night to help with sleep and potentially with depression as well.   seen August 2020.  He ended up using hydroxyzine instead of quetiapine for sleep.  He was satisfied with his meds overall.  Anxiety and depression were improved but not resolved on selegiline 7.5 mg every morning.  No meds were changed. Off and on taking selegiline 10 mg am about 4 days weekly and other days 7.5 mg am.  It has clearly helped the depression which is under control.  Xanax helps the anxiety.  seen November 2020.  The following med decisions were made: Increase hydroxyzine 50 mg to 2 HS for sleep. Continue selegiline 7.5 mg each morning DT SE insomnia.  He had worsening symptoms of depression and anxiety when he reduced the dose to 5 mg daily.   04/29/19 appt without med changes and following noted: B-in-law died at 60 at [place] with Covid.  Pt having problems with low platelets.   Further workup ongoing.  Sometimess feels funny in afternoon with 7.5 mg selegiline and will intermittently reduce the dose.  Still CO trouble with sleep. Drowsy if still in the afternoon. Still issues with sleep and then gets mad he can't go to sleep.  3-4 hours of sleep total.  No caffeine after lunch.  No napping.  Says quetiapine didn't help sleep but is taking hydroxyzine 50 and asks to increase it. He dropped the selegiline to 7.5 mg but still had SE.  Then reduced to 5 mg and did OK for awhile but then gradually more depression with stressors.   Tolerated it fine this time.  It is helping.  Less anxious.  Has helped the depression until the other things occurred. Was doing fine until Covid.  Is high risk with workup for leukemia with low WBC.  That's working against him.  10/28/19 appt with the following noted: They expect me to die.  Severe nonalcoholic cirrhosis.  Requiring ascites drainage.  Severe blood abnormalities.  Not ready to die.  Doctor said to get his affairs in order.  Trying to get wife ready and it's very hard.  Hard to handle.  No timeline on lifespan.  Doesn't expect to be eligible for liver transplant.   Has had 27 surgeries.  Wonders about blood cancer too bc platelet count is so low.  Esophageal varices required surgery. Has to take it one day at a time.  Wake at night thinking about it and having NM about it. No concerns about psych meds from medical perspective per the doctors.   Restrictive diet. Taking Xanax TID and feels like he needs another in the middle of the night.  It calms him way down but doesn't make him sleep.  Will have heart racing fear and the Xanax takes it down. Takes it 730, 12 noon and 6:30 and then needs another in middle of night. No energy due to illness. Plan: Increase alprazolam to 2 mg 4 times daily. Because hydroxyzine was not helpful he was encouraged to stop it.  04/26/20 appt noted: Don't go anywhere bc immune system.  Doing OK.   Says  quetiapine went from $30 to $120 for  3 mos supplytaking 200 mg HS.  Sleep is better and pretty good overall.  To bed 11 and awaken 3 a No SE. nd 530 and up 6 and getting 6 hours which is good. Helped more than anything else. There is better overall mood and anxiety wise than in a long time.  Satisfied with meds and does not want changes. Plan no med changes  10/13/20 appt noted: TKR 2 weeks ago.  OK so far except pain.  This has interfered with sleep but is getting better.  Overall feels good for what he's been through and satisfied with each of med and tolerating well without excessive sedation.  Pt reports that mood is Anxious and Depressed and both are improved with the selegiline. Extra Xanax made a big difference with anxiety.  No napping.   Anxiety symptoms include: Excessive Worry,.  Pt reports that appetite is decreased. Pt reports that energy is improved and improved. Concentration is improved. Suicidal thoughts:  denied by patient. . Suicidal thoughts:  denied by patient.  No increase in pain meds usually.       No caffeine.   Uses CPAP except lately because of having nose cancer surgery.  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 Gets tired and drowsy in the day without sleep.  No caffeine after 9 pm.    Past Psychiatric Medication Trials: Trazodone, Wellbutrin, mirtazapine, duloxetine, paroxetine, citalopram, Vivactil, selegiline (Emsam) for several years with good response until 2016  and stopped DT cost,   quetiapine 100, Xanax 2 mg at night then to 4 times daily, Sonata, Lunesta, Ambien, Rozerem, hydroxyzine hs NR  Review of Systems:  Review of Systems  Constitutional:  Positive for fatigue.  Cardiovascular:  Negative for palpitations.  Gastrointestinal:  Negative for abdominal distention.  Musculoskeletal:  Positive for arthralgias, back pain and gait problem.  Neurological:  Negative for tremors and weakness.  Psychiatric/Behavioral:  Negative for agitation, behavioral problems,  confusion, decreased concentration, dysphoric mood, hallucinations, self-injury, sleep disturbance and suicidal ideas. The patient is not nervous/anxious and is not hyperactive.    Medications: I have reviewed the patient's current medications.  Current Outpatient Medications  Medication Sig Dispense Refill   atenolol (TENORMIN) 25 MG tablet Take 25 mg by mouth daily.      atorvastatin (LIPITOR) 40 MG tablet Take 40 mg by mouth daily before breakfast.      Cyanocobalamin (VITAMIN B 12 PO) Take 1,000 mcg by mouth daily.     ferrous sulfate 325 (65 FE) MG tablet Take 325 mg by mouth daily with breakfast.     furosemide (LASIX) 20 MG tablet Take 20 mg by mouth daily.     HYDROmorphone (DILAUDID) 8 MG tablet Take 1 tablet (8 mg total) by mouth every  4 (four) hours as needed for moderate pain or severe pain. 40 tablet 0   insulin regular (NOVOLIN R) 100 units/mL injection Inject 18 Units into the skin daily with lunch.     lisinopril (ZESTRIL) 10 MG tablet Take 10 mg by mouth daily.   0   NOVOLIN 70/30 FLEXPEN (70-30) 100 UNIT/ML PEN Inject 60 Units into the skin in the morning and at bedtime.      pantoprazole (PROTONIX) 40 MG tablet Take 40 mg by mouth 2 (two) times daily.   0   spironolactone (ALDACTONE) 50 MG tablet Take 50 mg by mouth daily.     vitamin E 400 UNIT capsule Take 400 Units by mouth daily.     alprazolam (XANAX) 2 MG tablet Take 1 tablet (2 mg total) by mouth in the morning, at noon, in the evening, and at bedtime. 120 tablet 5   QUEtiapine (SEROQUEL) 200 MG tablet TAKE 1 TABLET BY MOUTH AT BEDTIME. TAKE 1-2 TABLETS AT NIGHT AS NEEDED FOR SEVERE INSOMNIA. 90 tablet 0   selegiline (ELDEPRYL) 5 MG tablet TAKE 1 & 1/2 TABLETS BY MOUTH EVERY MORNING 135 tablet 1   No current facility-administered medications for this visit.   Facility-Administered Medications Ordered in Other Visits  Medication Dose Route Frequency Provider Last Rate Last Admin   0.9 %  sodium chloride infusion    Intravenous Once Ralene Ok, MD        Medication Side Effects: Insomnia  Allergies:  Allergies  Allergen Reactions   Canagliflozin Itching and Other (See Comments)    Yeast infections  pancreatitis Yeast infections  pancreatitis Yeast infections   Cyclobenzaprine Anaphylaxis and Other (See Comments)    REACTION:  Not compatible with Emsam patch---not taking this patch any longer    Gabapentin Other (See Comments)    Other reaction(s): shortness of breath/tardive dyskinesia   Tanzeum [Albiglutide] Other (See Comments)    PANCREATITIS   Hydrocodone-Acetaminophen Hives    REACTION: pt turns red and has hot flashes    Iodinated Diagnostic Agents Rash    1 EPISODE, RELIEVED WITH BENADRYL   Iohexol Hives    After CT a/p. Gave him Benadryl '25mg'$  PO for fewer than 10 hives on face/neck.  No swelling or airway issues. Gypsy Lore, RN After CT a/p. Gave him Benadryl '25mg'$  PO for fewer than 10 hives on face/neck.  No swelling or airway issues. Gypsy Lore, RN   Metformin And Related Diarrhea   Nsaids Diarrhea and Other (See Comments)    Other reaction(s): stomach upset Stomach Upset  Other reaction(s): stomach upset Stomach Upset Stomach Upset   Other Other (See Comments)   Vicodin [Hydrocodone-Acetaminophen] Hives and Other (See Comments)    Other reaction(s): hives/itching (although he is able to tolerate Percocet) REACTION: pt turns red and has hot flashes     Past Medical History:  Diagnosis Date   Anxiety    takes Xanax daily as needed   Blood dyscrasia    thrombocytopenia    Blood transfusion    platelets   C. difficile colitis 03/2015   Depression    Emsam patch daily   Diabetes mellitus     Type II    GERD (gastroesophageal reflux disease)    takes Protonix daily   Hepatitis    hepatitis b/ newly diagnosed with portal hypertension   Hepatitis B virus infection 03/2007   History of kidney stones    passed- x 2   History of shingles    Hyperlipidemia  takes  AtorvaStatin daily   Hypertension    takes Atenolol and Lisinopril daily   Obstructive sleep apnea (adult) (pediatric)    mild with AHI 11.61/hr now on CPAP at 15cm h2o, CPAP is used q night    Osteoarthritis    Pancreatitis    Sciatica    lumbar region - 2010, also has had numerous injections    Splenomegaly    LOV Dr Lamonte Sakai  12/12 on chart   Thrombocytopenia ([place])     Family History  Problem Relation Age of Onset   Cancer Mother     Social History   Socioeconomic History   Marital status: Married    Spouse name: Not on file   Number of children: Not on file   Years of education: Not on file   Highest education level: Not on file  Occupational History   Not on file  Tobacco Use   Smoking status: Former    Packs/day: 1.00    Years: 15.00    Pack years: 15.00    Types: Cigarettes    Quit date: 12/31/1985    Years since quitting: 34.8   Smokeless tobacco: Never  Vaping Use   Vaping Use: Never used  Substance and Sexual Activity   Alcohol use: No   Drug use: No   Sexual activity: Not on file  Other Topics Concern   Not on file  Social History Narrative   Not on file   Social Determinants of Health   Financial Resource Strain: Not on file  Food Insecurity: Not on file  Transportation Needs: Not on file  Physical Activity: Not on file  Stress: Not on file  Social Connections: Not on file  Intimate Partner Violence: Not on file    Past Medical History, Surgical history, Social history, and Family history were reviewed and updated as appropriate.   Please see review of systems for further details on the patient's review from today.   Objective:   Physical Exam:  There were no vitals taken for this visit.  Physical Exam Neurological:     Mental Status: He is alert and oriented to person, place, and time.     Cranial Nerves: No dysarthria.  Psychiatric:        Attention and Perception: Attention and perception normal.        Mood and Affect: Mood is  anxious. Mood is not depressed.        Speech: Speech normal.        Behavior: Behavior is cooperative.        Thought Content: Thought content normal. Thought content is not paranoid or delusional. Thought content does not include homicidal or suicidal ideation. Thought content does not include homicidal or suicidal plan.        Cognition and Memory: Cognition and memory normal.        Judgment: Judgment normal.     Comments: Insight intact Depression managed Anxiety is improved with increase alprazolam    Lab Review:     Component Value Date/Time   NA 135 07/21/2019 1125   K 4.3 07/21/2019 1125   CL 103 07/21/2019 1125   CO2 26 07/21/2019 1125   GLUCOSE 195 (H) 07/21/2019 1125   BUN 11 07/21/2019 1125   CREATININE 0.85 07/21/2019 1125   CALCIUM 8.9 07/21/2019 1125   PROT 6.2 (L) 07/21/2019 1125   ALBUMIN 3.0 (L) 07/21/2019 1125   AST 27 07/21/2019 1125   ALT 29 07/21/2019 1125   ALKPHOS 84  07/21/2019 1125   BILITOT 1.1 07/21/2019 1125   GFRNONAA >60 07/21/2019 1125   GFRAA >60 07/21/2019 1125       Component Value Date/Time   WBC 3.2 (L) 07/21/2019 1125   RBC 4.28 07/21/2019 1125   HGB 12.6 (L) 07/21/2019 1125   HGB 13.7 01/22/2011 1348   HCT 37.9 (L) 07/21/2019 1125   HCT 40.1 01/22/2011 1348   PLT 41 (L) 07/21/2019 1125   PLT 59 (L) 01/22/2011 1348   MCV 88.6 07/21/2019 1125   MCV 87.9 01/22/2011 1348   MCH 29.4 07/21/2019 1125   MCHC 33.2 07/21/2019 1125   RDW 14.8 07/21/2019 1125   RDW 13.9 01/22/2011 1348   LYMPHSABS 1.2 04/22/2015 1423   LYMPHSABS 1.1 01/22/2011 1348   MONOABS 0.3 04/22/2015 1423   MONOABS 0.3 01/22/2011 1348   EOSABS 0.1 04/22/2015 1423   EOSABS 0.1 01/22/2011 1348   BASOSABS 0.0 04/22/2015 1423   BASOSABS 0.0 01/22/2011 1348    No results found for: POCLITH, LITHIUM   No results found for: PHENYTOIN, PHENOBARB, VALPROATE, CBMZ   .res Assessment: Plan:    Sandro was seen today for follow-up, depression, anxiety and sleeping  problem.  Diagnoses and all orders for this visit:  Major depressive disorder, recurrent episode, moderate (HCC) -     selegiline (ELDEPRYL) 5 MG tablet; TAKE 1 & 1/2 TABLETS BY MOUTH EVERY MORNING  Panic disorder with agoraphobia -     alprazolam (XANAX) 2 MG tablet; Take 1 tablet (2 mg total) by mouth in the morning, at noon, in the evening, and at bedtime.  Generalized anxiety disorder -     alprazolam (XANAX) 2 MG tablet; Take 1 tablet (2 mg total) by mouth in the morning, at noon, in the evening, and at bedtime.  Insomnia due to mental condition -     QUEtiapine (SEROQUEL) 200 MG tablet; TAKE 1 TABLET BY MOUTH AT BEDTIME. TAKE 1-2 TABLETS AT NIGHT AS NEEDED FOR SEVERE INSOMNIA. -     alprazolam (XANAX) 2 MG tablet; Take 1 tablet (2 mg total) by mouth in the morning, at noon, in the evening, and at bedtime.  Greater than 50% of 30 min face to face time with patient was spent on counseling and coordination of care. We discussed Patient with a long history of recurrent major depression panic disorder generalized anxiety disorder and insomnia.  He had a good response to selegiline over a period of number of years but stopped it in 2017 due to expense of the Emsam patch.  .  He is failed multiple other SSRIs and SNRIs.  His depression has generally responded to oral selegiline.  His anxiety is improved with the increase in alprazolam to the maximum 2 mg 4 times daily.  He is tolerating it well.    Panic and anxiety symptoms are pretty well controlled at present.  We used the selegiline tablets in place of the patch as it is more affordable.  He started selegiline 5 mg half twice daily for 3 days then 1 twice dail.  We discussed side effects in detail including MAO inhibitors restrictions regarding medication interactions.  Unfortunately he had significant problems with insomnia which is a known side effect possibility and some tension.   However his depression and energy are improved and he's  satisfied with meds. Insomnia was a problem and he's not sure if it's worse on selegiline 10 mg selegiline vs 7.5 mg AM.    Insomnia resolved with quetiapine 200 mg  nightly.  We discussed the use of good Rx as a way to get around high cost of quetiapine with his insurance.  Continue alprazolam to 2 mg 4 times daily.He is aware that this is a high dose and of asked him not to take other sedatives at night.  We discussed the fall risk and that given his low platelet count if he has a fall he is at increased risk of complications of bleeding.  He understands this risk.  He states it is not overly sedating to him when he takes it during the day. Does not appear sedated.  Disc increased risk with opiates. No abuse evidence.  Continue selegiline 7.5 mg each morning DT SE insomnia.  He had worsening symptoms of depression and anxiety when he reduced the dose to 5 mg daily.  Discussed potential metabolic side effects associated with atypical antipsychotics, as well as potential risk for movement side effects. Advised pt to contact office if movement side effects occur.   We discussed the short-term risks associated with benzodiazepines including sedation and increased fall risk among others.  Discussed long-term side effect risk including dependence, potential withdrawal symptoms, and the potential eventual dose-related risk of dementia.  Again discussed MAO inhibitor restrictions and especially drug interaction issues.  FU 6 months  Lynder Parents, MD, DFAPA    Please see After Visit Summary for patient specific instructions.  No future appointments.  No orders of the defined types were placed in this encounter.     -------------------------------

## 2020-10-18 DIAGNOSIS — Z96652 Presence of left artificial knee joint: Secondary | ICD-10-CM | POA: Diagnosis not present

## 2020-10-18 DIAGNOSIS — Z471 Aftercare following joint replacement surgery: Secondary | ICD-10-CM | POA: Diagnosis not present

## 2020-10-20 DIAGNOSIS — R2689 Other abnormalities of gait and mobility: Secondary | ICD-10-CM | POA: Diagnosis not present

## 2020-10-20 DIAGNOSIS — M25662 Stiffness of left knee, not elsewhere classified: Secondary | ICD-10-CM | POA: Diagnosis not present

## 2020-10-20 DIAGNOSIS — M6281 Muscle weakness (generalized): Secondary | ICD-10-CM | POA: Diagnosis not present

## 2020-10-20 DIAGNOSIS — R29898 Other symptoms and signs involving the musculoskeletal system: Secondary | ICD-10-CM | POA: Diagnosis not present

## 2020-10-20 DIAGNOSIS — M1990 Unspecified osteoarthritis, unspecified site: Secondary | ICD-10-CM | POA: Diagnosis not present

## 2020-10-25 DIAGNOSIS — M25562 Pain in left knee: Secondary | ICD-10-CM | POA: Diagnosis not present

## 2020-10-25 DIAGNOSIS — Z96652 Presence of left artificial knee joint: Secondary | ICD-10-CM | POA: Diagnosis not present

## 2020-10-25 DIAGNOSIS — Z471 Aftercare following joint replacement surgery: Secondary | ICD-10-CM | POA: Diagnosis not present

## 2020-11-02 DIAGNOSIS — K7460 Unspecified cirrhosis of liver: Secondary | ICD-10-CM | POA: Diagnosis not present

## 2020-11-02 DIAGNOSIS — D696 Thrombocytopenia, unspecified: Secondary | ICD-10-CM | POA: Diagnosis not present

## 2020-11-02 DIAGNOSIS — D72819 Decreased white blood cell count, unspecified: Secondary | ICD-10-CM | POA: Diagnosis not present

## 2020-11-02 DIAGNOSIS — E611 Iron deficiency: Secondary | ICD-10-CM | POA: Diagnosis not present

## 2020-11-02 DIAGNOSIS — E538 Deficiency of other specified B group vitamins: Secondary | ICD-10-CM | POA: Diagnosis not present

## 2020-11-15 DIAGNOSIS — M25662 Stiffness of left knee, not elsewhere classified: Secondary | ICD-10-CM | POA: Diagnosis not present

## 2020-11-15 DIAGNOSIS — M25562 Pain in left knee: Secondary | ICD-10-CM | POA: Diagnosis not present

## 2020-11-17 DIAGNOSIS — R059 Cough, unspecified: Secondary | ICD-10-CM | POA: Diagnosis not present

## 2020-11-17 DIAGNOSIS — J018 Other acute sinusitis: Secondary | ICD-10-CM | POA: Diagnosis not present

## 2020-11-24 DIAGNOSIS — Z96652 Presence of left artificial knee joint: Secondary | ICD-10-CM | POA: Diagnosis not present

## 2020-12-13 DIAGNOSIS — Z4789 Encounter for other orthopedic aftercare: Secondary | ICD-10-CM | POA: Diagnosis not present

## 2020-12-13 DIAGNOSIS — Z96652 Presence of left artificial knee joint: Secondary | ICD-10-CM | POA: Diagnosis not present

## 2020-12-13 DIAGNOSIS — M1712 Unilateral primary osteoarthritis, left knee: Secondary | ICD-10-CM | POA: Diagnosis not present

## 2020-12-16 DIAGNOSIS — N521 Erectile dysfunction due to diseases classified elsewhere: Secondary | ICD-10-CM | POA: Diagnosis not present

## 2020-12-16 DIAGNOSIS — E1169 Type 2 diabetes mellitus with other specified complication: Secondary | ICD-10-CM | POA: Diagnosis not present

## 2020-12-16 DIAGNOSIS — E785 Hyperlipidemia, unspecified: Secondary | ICD-10-CM | POA: Diagnosis not present

## 2020-12-16 DIAGNOSIS — I152 Hypertension secondary to endocrine disorders: Secondary | ICD-10-CM | POA: Diagnosis not present

## 2020-12-16 DIAGNOSIS — K760 Fatty (change of) liver, not elsewhere classified: Secondary | ICD-10-CM | POA: Diagnosis not present

## 2020-12-16 DIAGNOSIS — E1165 Type 2 diabetes mellitus with hyperglycemia: Secondary | ICD-10-CM | POA: Diagnosis not present

## 2020-12-16 DIAGNOSIS — K7469 Other cirrhosis of liver: Secondary | ICD-10-CM | POA: Diagnosis not present

## 2020-12-16 DIAGNOSIS — Z5181 Encounter for therapeutic drug level monitoring: Secondary | ICD-10-CM | POA: Diagnosis not present

## 2020-12-16 DIAGNOSIS — E1159 Type 2 diabetes mellitus with other circulatory complications: Secondary | ICD-10-CM | POA: Diagnosis not present

## 2020-12-23 DIAGNOSIS — Z96652 Presence of left artificial knee joint: Secondary | ICD-10-CM | POA: Diagnosis not present

## 2020-12-23 DIAGNOSIS — M533 Sacrococcygeal disorders, not elsewhere classified: Secondary | ICD-10-CM | POA: Diagnosis not present

## 2020-12-23 DIAGNOSIS — S322XXA Fracture of coccyx, initial encounter for closed fracture: Secondary | ICD-10-CM | POA: Diagnosis not present

## 2020-12-23 DIAGNOSIS — Z471 Aftercare following joint replacement surgery: Secondary | ICD-10-CM | POA: Diagnosis not present

## 2021-01-03 DIAGNOSIS — F411 Generalized anxiety disorder: Secondary | ICD-10-CM | POA: Diagnosis not present

## 2021-01-03 DIAGNOSIS — K219 Gastro-esophageal reflux disease without esophagitis: Secondary | ICD-10-CM | POA: Diagnosis not present

## 2021-01-03 DIAGNOSIS — G4700 Insomnia, unspecified: Secondary | ICD-10-CM | POA: Diagnosis not present

## 2021-01-03 DIAGNOSIS — G4733 Obstructive sleep apnea (adult) (pediatric): Secondary | ICD-10-CM | POA: Diagnosis not present

## 2021-01-03 DIAGNOSIS — E782 Mixed hyperlipidemia: Secondary | ICD-10-CM | POA: Diagnosis not present

## 2021-01-03 DIAGNOSIS — D696 Thrombocytopenia, unspecified: Secondary | ICD-10-CM | POA: Diagnosis not present

## 2021-01-03 DIAGNOSIS — I10 Essential (primary) hypertension: Secondary | ICD-10-CM | POA: Diagnosis not present

## 2021-01-03 DIAGNOSIS — E1169 Type 2 diabetes mellitus with other specified complication: Secondary | ICD-10-CM | POA: Diagnosis not present

## 2021-01-03 DIAGNOSIS — K7460 Unspecified cirrhosis of liver: Secondary | ICD-10-CM | POA: Diagnosis not present

## 2021-01-03 DIAGNOSIS — L03116 Cellulitis of left lower limb: Secondary | ICD-10-CM | POA: Diagnosis not present

## 2021-01-03 DIAGNOSIS — R059 Cough, unspecified: Secondary | ICD-10-CM | POA: Diagnosis not present

## 2021-01-13 DIAGNOSIS — Z85828 Personal history of other malignant neoplasm of skin: Secondary | ICD-10-CM | POA: Diagnosis not present

## 2021-01-13 DIAGNOSIS — D696 Thrombocytopenia, unspecified: Secondary | ICD-10-CM | POA: Diagnosis not present

## 2021-01-13 DIAGNOSIS — L03116 Cellulitis of left lower limb: Secondary | ICD-10-CM | POA: Diagnosis not present

## 2021-01-16 DIAGNOSIS — H401131 Primary open-angle glaucoma, bilateral, mild stage: Secondary | ICD-10-CM | POA: Diagnosis not present

## 2021-01-19 DIAGNOSIS — Z85828 Personal history of other malignant neoplasm of skin: Secondary | ICD-10-CM | POA: Diagnosis not present

## 2021-01-19 DIAGNOSIS — L308 Other specified dermatitis: Secondary | ICD-10-CM | POA: Diagnosis not present

## 2021-01-28 DIAGNOSIS — Z20822 Contact with and (suspected) exposure to covid-19: Secondary | ICD-10-CM | POA: Diagnosis not present

## 2021-01-28 DIAGNOSIS — Z23 Encounter for immunization: Secondary | ICD-10-CM | POA: Diagnosis not present

## 2021-02-03 DIAGNOSIS — R059 Cough, unspecified: Secondary | ICD-10-CM | POA: Diagnosis not present

## 2021-02-09 ENCOUNTER — Other Ambulatory Visit: Payer: Self-pay | Admitting: Psychiatry

## 2021-02-09 DIAGNOSIS — F5105 Insomnia due to other mental disorder: Secondary | ICD-10-CM

## 2021-03-14 ENCOUNTER — Ambulatory Visit
Admission: RE | Admit: 2021-03-14 | Discharge: 2021-03-14 | Disposition: A | Discharge status: Home / Self Care | Payer: Medicare Other | Source: Ambulatory Visit | Attending: Family Medicine | Admitting: Family Medicine

## 2021-03-14 ENCOUNTER — Other Ambulatory Visit: Payer: Self-pay | Admitting: Family Medicine

## 2021-03-14 DIAGNOSIS — E1169 Type 2 diabetes mellitus with other specified complication: Secondary | ICD-10-CM | POA: Diagnosis not present

## 2021-03-14 DIAGNOSIS — I10 Essential (primary) hypertension: Secondary | ICD-10-CM | POA: Diagnosis not present

## 2021-03-14 DIAGNOSIS — K219 Gastro-esophageal reflux disease without esophagitis: Secondary | ICD-10-CM | POA: Diagnosis not present

## 2021-03-14 DIAGNOSIS — D696 Thrombocytopenia, unspecified: Secondary | ICD-10-CM | POA: Diagnosis not present

## 2021-03-14 DIAGNOSIS — R059 Cough, unspecified: Secondary | ICD-10-CM | POA: Diagnosis not present

## 2021-03-14 DIAGNOSIS — Z794 Long term (current) use of insulin: Secondary | ICD-10-CM | POA: Diagnosis not present

## 2021-03-14 DIAGNOSIS — E782 Mixed hyperlipidemia: Secondary | ICD-10-CM | POA: Diagnosis not present

## 2021-03-14 DIAGNOSIS — K7460 Unspecified cirrhosis of liver: Secondary | ICD-10-CM | POA: Diagnosis not present

## 2021-03-14 DIAGNOSIS — G4700 Insomnia, unspecified: Secondary | ICD-10-CM | POA: Diagnosis not present

## 2021-03-14 DIAGNOSIS — F411 Generalized anxiety disorder: Secondary | ICD-10-CM | POA: Diagnosis not present

## 2021-03-14 DIAGNOSIS — Z6841 Body Mass Index (BMI) 40.0 and over, adult: Secondary | ICD-10-CM | POA: Diagnosis not present

## 2021-03-20 DIAGNOSIS — R0602 Shortness of breath: Secondary | ICD-10-CM | POA: Diagnosis not present

## 2021-03-20 DIAGNOSIS — K7460 Unspecified cirrhosis of liver: Secondary | ICD-10-CM | POA: Diagnosis not present

## 2021-03-20 DIAGNOSIS — Z79899 Other long term (current) drug therapy: Secondary | ICD-10-CM | POA: Diagnosis not present

## 2021-03-20 DIAGNOSIS — R188 Other ascites: Secondary | ICD-10-CM | POA: Diagnosis not present

## 2021-03-23 DIAGNOSIS — L308 Other specified dermatitis: Secondary | ICD-10-CM | POA: Diagnosis not present

## 2021-03-23 DIAGNOSIS — D485 Neoplasm of uncertain behavior of skin: Secondary | ICD-10-CM | POA: Diagnosis not present

## 2021-03-23 DIAGNOSIS — L820 Inflamed seborrheic keratosis: Secondary | ICD-10-CM | POA: Diagnosis not present

## 2021-03-23 DIAGNOSIS — Z85828 Personal history of other malignant neoplasm of skin: Secondary | ICD-10-CM | POA: Diagnosis not present

## 2021-03-24 DIAGNOSIS — K7469 Other cirrhosis of liver: Secondary | ICD-10-CM | POA: Diagnosis not present

## 2021-03-24 DIAGNOSIS — E1165 Type 2 diabetes mellitus with hyperglycemia: Secondary | ICD-10-CM | POA: Diagnosis not present

## 2021-03-24 DIAGNOSIS — E1159 Type 2 diabetes mellitus with other circulatory complications: Secondary | ICD-10-CM | POA: Diagnosis not present

## 2021-03-24 DIAGNOSIS — Z5181 Encounter for therapeutic drug level monitoring: Secondary | ICD-10-CM | POA: Diagnosis not present

## 2021-03-24 DIAGNOSIS — N521 Erectile dysfunction due to diseases classified elsewhere: Secondary | ICD-10-CM | POA: Diagnosis not present

## 2021-03-24 DIAGNOSIS — K760 Fatty (change of) liver, not elsewhere classified: Secondary | ICD-10-CM | POA: Diagnosis not present

## 2021-03-24 DIAGNOSIS — I152 Hypertension secondary to endocrine disorders: Secondary | ICD-10-CM | POA: Diagnosis not present

## 2021-03-24 DIAGNOSIS — E785 Hyperlipidemia, unspecified: Secondary | ICD-10-CM | POA: Diagnosis not present

## 2021-03-24 DIAGNOSIS — E1169 Type 2 diabetes mellitus with other specified complication: Secondary | ICD-10-CM | POA: Diagnosis not present

## 2021-04-04 ENCOUNTER — Telehealth: Payer: Self-pay | Admitting: Psychiatry

## 2021-04-04 ENCOUNTER — Other Ambulatory Visit: Payer: Self-pay

## 2021-04-04 DIAGNOSIS — F5105 Insomnia due to other mental disorder: Secondary | ICD-10-CM

## 2021-04-04 MED ORDER — QUETIAPINE FUMARATE 200 MG PO TABS: ORAL_TABLET | ORAL | 0 refills | Status: DC

## 2021-04-04 NOTE — Telephone Encounter (Signed)
Pt requesting Rx for Quetiapine 200 mg 2  at bedtime for severe insomnia 3 months supply. @ [facility] [address]

## 2021-04-04 NOTE — Telephone Encounter (Signed)
Rx sent 

## 2021-04-05 ENCOUNTER — Other Ambulatory Visit: Payer: Self-pay | Admitting: Psychiatry

## 2021-04-05 DIAGNOSIS — F5105 Insomnia due to other mental disorder: Secondary | ICD-10-CM

## 2021-04-06 DIAGNOSIS — E785 Hyperlipidemia, unspecified: Secondary | ICD-10-CM | POA: Diagnosis not present

## 2021-04-06 DIAGNOSIS — I878 Other specified disorders of veins: Secondary | ICD-10-CM | POA: Diagnosis not present

## 2021-04-06 DIAGNOSIS — Z96652 Presence of left artificial knee joint: Secondary | ICD-10-CM | POA: Diagnosis not present

## 2021-04-06 DIAGNOSIS — E1169 Type 2 diabetes mellitus with other specified complication: Secondary | ICD-10-CM | POA: Diagnosis not present

## 2021-04-07 ENCOUNTER — Other Ambulatory Visit: Payer: Self-pay | Admitting: Psychiatry

## 2021-04-07 DIAGNOSIS — F5105 Insomnia due to other mental disorder: Secondary | ICD-10-CM

## 2021-04-08 ENCOUNTER — Other Ambulatory Visit: Payer: Self-pay | Admitting: Psychiatry

## 2021-04-08 DIAGNOSIS — F411 Generalized anxiety disorder: Secondary | ICD-10-CM

## 2021-04-08 DIAGNOSIS — F5105 Insomnia due to other mental disorder: Secondary | ICD-10-CM

## 2021-04-08 DIAGNOSIS — F4001 Agoraphobia with panic disorder: Secondary | ICD-10-CM

## 2021-04-12 ENCOUNTER — Other Ambulatory Visit: Payer: Self-pay

## 2021-04-12 ENCOUNTER — Encounter: Payer: Self-pay | Admitting: Cardiology

## 2021-04-12 ENCOUNTER — Ambulatory Visit (INDEPENDENT_AMBULATORY_CARE_PROVIDER_SITE_OTHER): Payer: Medicare Other | Admitting: Cardiology

## 2021-04-12 VITALS — BP 136/60 | HR 78 | Ht 72.0 in | Wt 307.0 lb

## 2021-04-12 DIAGNOSIS — I10 Essential (primary) hypertension: Secondary | ICD-10-CM

## 2021-04-12 DIAGNOSIS — G4733 Obstructive sleep apnea (adult) (pediatric): Secondary | ICD-10-CM | POA: Diagnosis not present

## 2021-04-12 NOTE — Progress Notes (Signed)
? ?Date:  04/12/2021  ? ?ID:  Victor [name], DOB 1960/03/01, MRN [id number] ? ?PCP:  Antony Contras, MD  ?Sleep Medicine:  Fransico Him, MD ?Electrophysiologist:  None  ? ?Chief Complaint:  OSA ? ?History of Present Illness:   ? ?Victor [name] is a 61 y.o. male with a hx of OSA, obesity and HTN .  He has a history of mild OSA with an AHI of 11.61/hr and now on CPAP at 15cm H2O.   ? ?He is doing well with his CPAP device and thinks that he he has gotten used to it.  He tolerates the mask and feels the pressure is adequate.  Since going on CPAP he feels rested in the am and has no significant daytime sleepiness.  He denies any significant mouth or nasal dryness or nasal congestion.  He does not think that he snores.   He has had a cold and has had a cough for a week. Neg for COVID ? ? ?Prior CV studies:   ?The following studies were reviewed today: ? ?PAP compliance download from [facility] ? ?Past Medical History:  ?Diagnosis Date  ? Anxiety   ? takes Xanax daily as needed  ? Blood dyscrasia   ? thrombocytopenia   ? Blood transfusion   ? platelets  ? C. difficile colitis 03/2015  ? Depression   ? Emsam patch daily  ? Diabetes mellitus   ?  Type II   ? GERD (gastroesophageal reflux disease)   ? takes Protonix daily  ? Hepatitis   ? hepatitis b/ newly diagnosed with portal hypertension  ? Hepatitis B virus infection 03/2007  ? History of kidney stones   ? passed- x 2  ? History of shingles   ? Hyperlipidemia   ? takes AtorvaStatin daily  ? Hypertension   ? takes Atenolol and Lisinopril daily  ? Obstructive sleep apnea (adult) (pediatric)   ? mild with AHI 11.61/hr now on CPAP at 15cm h2o, CPAP is used q night   ? Osteoarthritis   ? Pancreatitis   ? Sciatica   ? lumbar region - 2010, also has had numerous injections   ? Splenomegaly   ? LOV Dr Lamonte Sakai  12/12 on chart  ? Thrombocytopenia ([place])   ? ?Past Surgical History:  ?Procedure Laterality Date  ? BACK SURGERY    ? X9  ? CHOLECYSTECTOMY N/A 04/26/2015  ? Procedure: LAPAROSCOPIC  CHOLECYSTECTOMY;  Surgeon: Ralene Ok, MD;  Location: [facility];  Service: General;  Laterality: N/A;  ? COLONOSCOPY W/ POLYPECTOMY    ? HAND SURGERY Right   ? right hand x 2- injured  ? KNEE SURGERY    ? 5 surgeries to right knee, 3 surgeries to left knee  ? KYPHOPLASTY    ? LUMBAR LAMINECTOMY/DECOMPRESSION MICRODISCECTOMY  02/19/2011  ? Procedure: LUMBAR LAMINECTOMY/DECOMPRESSION MICRODISCECTOMY;  Surgeon: Johnn Hai;  Location: [facility];  Service: Orthopedics;  Laterality: Left;  decompression l5-s1 l4-5 on left  ? SHOULDER ARTHROSCOPY WITH SUBACROMIAL DECOMPRESSION Right 11/30/2016  ? Procedure: Right shoulder arthroscopy, extensive debridement and subacromial decompression;  Surgeon: Nicholes Stairs, MD;  Location: [facility];  Service: Orthopedics;  Laterality: Right;  80  ? SHOULDER SURGERY Left   ? left shoulder, Rotar Cuff  ? SPINAL CORD STIMULATOR BATTERY EXCHANGE N/A 03/14/2016  ? Procedure: Revision of spinal cord stimulator battery;  Surgeon: Melina Schools, MD;  Location: [facility];  Service: Orthopedics;  Laterality: N/A;  Requests 1 hour  ? SPINAL CORD STIMULATOR  INSERTION N/A 02/02/2015  ? Procedure: LUMBAR SPINAL CORD STIMULATOR INSERTION;  Surgeon: Melina Schools, MD;  Location: [facility];  Service: Orthopedics;  Laterality: N/A;  ?  ? ?Current Meds  ?Medication Sig  ? alprazolam (XANAX) 2 MG tablet Take 1 tablet (2 mg total) by mouth in the morning, at noon, in the evening, and at bedtime.  ? atenolol (TENORMIN) 25 MG tablet Take 25 mg by mouth daily.   ? atorvastatin (LIPITOR) 40 MG tablet Take 40 mg by mouth daily before breakfast.   ? Cyanocobalamin (VITAMIN B 12 PO) Take 1,000 mcg by mouth daily.  ? ferrous sulfate 325 (65 FE) MG tablet Take 325 mg by mouth daily with breakfast.  ? furosemide (LASIX) 20 MG tablet Take 20 mg by mouth daily.  ? HYDROmorphone (DILAUDID) 8 MG tablet Take 1 tablet (8 mg total) by mouth every 4 (four) hours as needed for moderate pain or severe pain.  ? insulin regular  (NOVOLIN R) 100 units/mL injection Inject 18 Units into the skin daily with lunch.  ? lisinopril (ZESTRIL) 10 MG tablet Take 10 mg by mouth daily.   ? NOVOLIN 70/30 FLEXPEN (70-30) 100 UNIT/ML PEN Inject 60 Units into the skin in the morning and at bedtime.   ? pantoprazole (PROTONIX) 40 MG tablet Take 40 mg by mouth 2 (two) times daily.   ? QUEtiapine (SEROQUEL) 200 MG tablet TAKE 1 TO 2 TABLETS BY MOUTH AT BEDTIME AS NEEDED FOR  SEVERE  INSOMNIA  ? selegiline (ELDEPRYL) 5 MG tablet TAKE 1 & 1/2 TABLETS BY MOUTH EVERY MORNING  ? spironolactone (ALDACTONE) 50 MG tablet Take 50 mg by mouth daily.  ? vitamin E 400 UNIT capsule Take 400 Units by mouth daily.  ?  ? ?Allergies:   Canagliflozin, Cyclobenzaprine, Gabapentin, Tanzeum [albiglutide], Hydrocodone-acetaminophen, Iodinated contrast media, Iohexol, Metformin and related, Nsaids, Other, and Vicodin [hydrocodone-acetaminophen]  ? ?Social History  ? ?Tobacco Use  ? Smoking status: Former  ?  Packs/day: 1.00  ?  Years: 15.00  ?  Pack years: 15.00  ?  Types: Cigarettes  ?  Quit date: 12/31/1985  ?  Years since quitting: 35.3  ? Smokeless tobacco: Never  ?Vaping Use  ? Vaping Use: Never used  ?Substance Use Topics  ? Alcohol use: No  ? Drug use: No  ?  ? ?Family Hx: ?The patient's family history includes Cancer in his mother. ? ?ROS:   ?Please see the history of present illness.    ? ?All other systems reviewed and are negative. ? ? ?Labs/Other Tests and Data Reviewed:   ? ?Recent Labs: ?No results found for requested labs within last 8760 hours.  ? ?Recent Lipid Panel ?No results found for: CHOL, TRIG, HDL, CHOLHDL, LDLCALC, LDLDIRECT ? ?Wt Readings from Last 3 Encounters:  ?04/12/21 (!) 307 lb (139.3 kg)  ?04/13/20 297 lb 6.4 oz (134.9 kg)  ?07/23/19 278 lb (126.1 kg)  ?  ? ?Objective:   ? ?Vital Signs:  BP 136/60 (BP Location: Left Arm, Patient Position: Sitting, Cuff Size: Large)   Pulse 78   Ht 6' (1.829 m)   Wt (!) 307 lb (139.3 kg)   BMI 41.64 kg/m?    ? ?GEN: Well nourished, well developed in no acute distress ?HEENT: Normal ?NECK: No JVD; No carotid bruits ?LYMPHATICS: No lymphadenopathy ?CARDIAC:RRR, no murmurs, rubs, gallops ?RESPIRATORY:  scattered expiratory wheezes ?ABDOMEN: Soft, non-tender, non-distended ?MUSCULOSKELETAL:  No edema; No deformity  ?SKIN: Warm and dry ?NEUROLOGIC:  Alert and oriented  x 3 ?PSYCHIATRIC:  Normal affect   ? ?EKG was performed today and demonstrate NSR with PACs, low voltage QRS, anterior infarct age undetermined ? ?ASSESSMENT & PLAN:   ? ?1.  OSA - The patient is tolerating PAP therapy well without any problems. The PAP download performed by his DME was personally reviewed and interpreted by me today and showed an AHI of 0.2/hr on 7 cm H2O with 87% compliance in using more than 4 hours nightly.  The patient has been using and benefiting from PAP use and will continue to benefit from therapy.  ?  ?2.  HTN ?-BP is adequately controlled on exam today ?-Continue prescription drug management with atenolol 25 mg daily and lisinopril 10 mg daily and spironolactone 50 mg daily with as needed refills ?-I have personally reviewed and interpreted outside labs performed by patient's PCP which showed SCr 1.13 and potassium 4.3 on 03/14/2021 ? ? ?Medication Adjustments/Labs and Tests Ordered: ?Current medicines are reviewed at length with the patient today.  Concerns regarding medicines are outlined above.  ?Tests Ordered: ?Orders Placed This Encounter  ?Procedures  ? EKG 12-Lead  ? ?Medication Changes: ?No orders of the defined types were placed in this encounter. ? ? ?Disposition:  Follow up in 1 year(s) ? ?Signed, ?Fransico Him, MD  ?04/12/2021 10:05 AM    ?[place] ?

## 2021-04-12 NOTE — Patient Instructions (Signed)

## 2021-04-13 ENCOUNTER — Ambulatory Visit (INDEPENDENT_AMBULATORY_CARE_PROVIDER_SITE_OTHER): Payer: Medicare Other | Admitting: Psychiatry

## 2021-04-13 ENCOUNTER — Encounter: Payer: Self-pay | Admitting: Psychiatry

## 2021-04-13 DIAGNOSIS — F411 Generalized anxiety disorder: Secondary | ICD-10-CM | POA: Diagnosis not present

## 2021-04-13 DIAGNOSIS — F4001 Agoraphobia with panic disorder: Secondary | ICD-10-CM

## 2021-04-13 DIAGNOSIS — F331 Major depressive disorder, recurrent, moderate: Secondary | ICD-10-CM

## 2021-04-13 DIAGNOSIS — F5105 Insomnia due to other mental disorder: Secondary | ICD-10-CM | POA: Diagnosis not present

## 2021-04-13 MED ORDER — ALPRAZOLAM 2 MG PO TABS
2.0000 mg | ORAL_TABLET | Freq: Four times a day (QID) | ORAL | 5 refills | Status: DC
Start: 2021-04-13 — End: 2021-10-12

## 2021-04-13 MED ORDER — QUETIAPINE FUMARATE 200 MG PO TABS: ORAL_TABLET | ORAL | 1 refills | Status: DC

## 2021-04-13 MED ORDER — SELEGILINE HCL 5 MG PO TABS: ORAL_TABLET | ORAL | 1 refills | Status: DC

## 2021-04-13 NOTE — Progress Notes (Signed)
[name] [id number] 10-22-1960 61 y.o.   Subjective:   Patient ID:  Victor [name] is a 61 y.o. (DOB 1960/12/04) male.  Chief Complaint:  Chief Complaint  Patient presents with   Follow-up   Depression   Sleeping Problem    Depression        Associated symptoms include fatigue.  Associated symptoms include no decreased concentration and no suicidal ideas.  Past medical history includes anxiety.   Anxiety Patient reports no confusion, decreased concentration, nervous/anxious behavior or suicidal ideas.    Victor [name] presents for follow-up of his psychiatric conditions today.  At visit Jul 04, 2018.  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  At  visit June 2020.  He has had problems with insomnia related to selegiline apparently.  We reduced the dose to 7.5 mg and gave it all in the morning.  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  He had benefit from selegiline 10 mg and then 7.5 mg but SE insomnia.  We also added low-dose quetiapine at night to help with sleep and potentially with depression as well.   seen August 2020.  He ended up using hydroxyzine instead of quetiapine for sleep.  He was satisfied with his meds overall.  Anxiety and depression were improved but not resolved on selegiline 7.5 mg every morning.  No meds were changed. Off and on taking selegiline 10 mg am about 4 days weekly and other days 7.5 mg am.  It has clearly helped the depression which is under control.  Xanax helps the anxiety.  seen November 2020.  The following med decisions were made: Increase hydroxyzine 50 mg to 2 HS for sleep. Continue selegiline 7.5 mg each morning DT SE insomnia.  He had worsening symptoms of depression and anxiety when he reduced the dose to 5 mg daily.   04/29/19 appt without med changes and following noted: B-in-law died at 37 at [facility] with Covid.  Pt having problems with low platelets.  Further workup ongoing.   Sometimess feels funny in afternoon with 7.5 mg selegiline and will intermittently reduce the dose.  Still CO trouble with sleep. Drowsy if still in the afternoon. Still issues with sleep and then gets mad he can't go to sleep.  3-4 hours of sleep total.  No caffeine after lunch.  No napping.  Says quetiapine didn't help sleep but is taking hydroxyzine 50 and asks to increase it. He dropped the selegiline to 7.5 mg but still had SE.  Then reduced to 5 mg and did OK for awhile but then gradually more depression with stressors.   Tolerated it fine this time.  It is helping.  Less anxious.  Has helped the depression until the other things occurred. Was doing fine until Covid.  Is high risk with workup for leukemia with low WBC.  That's working against him.  10/28/19 appt with the following noted: They expect me to die.  Severe nonalcoholic cirrhosis.  Requiring ascites drainage.  Severe blood abnormalities.  Not ready to die.  Doctor said to get his affairs in order.  Trying to get wife ready and it's very hard.  Hard to handle.  No timeline on lifespan.  Doesn't expect to be eligible for liver transplant.   Has had 27 surgeries.  Wonders about blood cancer too bc platelet count is so low.  Esophageal varices required surgery. Has to take it one day at a time.  Wake at night  thinking about it and having NM about it. No concerns about psych meds from medical perspective per the doctors.   Restrictive diet. Taking Xanax TID and feels like he needs another in the middle of the night.  It calms him way down but doesn't make him sleep.  Will have heart racing fear and the Xanax takes it down. Takes it 730, 12 noon and 6:30 and then needs another in middle of night. No energy due to illness. Plan: Increase alprazolam to 2 mg 4 times daily. Because hydroxyzine was not helpful he was encouraged to stop it.  04/26/20 appt noted: Don't go anywhere bc immune system.  Doing OK.   Says quetiapine went from $30 to  $120 for  3 mos supplytaking 200 mg HS.  Sleep is better and pretty good overall.  To bed 11 and awaken 3 a No SE. nd 530 and up 6 and getting 6 hours which is good. Helped more than anything else. There is better overall mood and anxiety wise than in a long time.  Satisfied with meds and does not want changes. Plan no med changes  10/13/20 appt noted: TKR 2 weeks ago.  OK so far except pain.  This has interfered with sleep but is getting better. Overall feels good for what he's been through and satisfied with each of med and tolerating well without excessive sedation. Pt reports that mood is Anxious and Depressed and both are improved with the selegiline. Extra Xanax made a big difference with anxiety.  No napping.   Anxiety symptoms include: Excessive Worry,.  Pt reports that appetite is decreased. Pt reports that energy is improved and improved. Concentration is improved. Suicidal thoughts:  denied by patient. . Suicidal thoughts:  denied by patient. Plan: cont selegiline 7.5 mg AM and Seroquel 200 mg HS  04/13/21 appt noted: Had to increase Seroquel to 400 mg HS and Selegiline 7.5 mg AM Sleep better with Seroquel 400 mg HS and taking Xanax.   Depression is managed "you can't get a whole lot better". Anxiety is manageable. No SE Uses CPAP  No increase in pain meds usually.       No caffeine.   Uses CPAP except lately because of having nose cancer surgery.  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 Gets tired and drowsy in the day without sleep.  No caffeine after 9 pm.    Past Psychiatric Medication Trials: Trazodone, Wellbutrin, mirtazapine, duloxetine, paroxetine, citalopram, Vivactil, selegiline (Emsam) for several years with good response until 2016  and stopped DT cost,   quetiapine 100, Xanax 2 mg at night then to 4 times daily, Sonata, Lunesta, Ambien, Rozerem, hydroxyzine hs NR  Review of Systems:  Review of Systems  Constitutional:  Positive for fatigue.  Musculoskeletal:  Positive for  arthralgias, back pain and gait problem.  Neurological:  Negative for tremors and weakness.  Psychiatric/Behavioral:  Negative for agitation, behavioral problems, confusion, decreased concentration, dysphoric mood, hallucinations, self-injury, sleep disturbance and suicidal ideas. The patient is not nervous/anxious and is not hyperactive.    Medications: I have reviewed the patient's current medications.  Current Outpatient Medications  Medication Sig Dispense Refill   atenolol (TENORMIN) 25 MG tablet Take 25 mg by mouth daily.      atorvastatin (LIPITOR) 40 MG tablet Take 40 mg by mouth daily before breakfast.      Cyanocobalamin (VITAMIN B 12 PO) Take 1,000 mcg by mouth daily.     ferrous sulfate 325 (65 FE) MG tablet Take 325  mg by mouth daily with breakfast.     furosemide (LASIX) 20 MG tablet Take 20 mg by mouth daily.     HYDROmorphone (DILAUDID) 8 MG tablet Take 1 tablet (8 mg total) by mouth every 4 (four) hours as needed for moderate pain or severe pain. 40 tablet 0   insulin regular (NOVOLIN R) 100 units/mL injection Inject 18 Units into the skin daily with lunch.     lisinopril (ZESTRIL) 10 MG tablet Take 10 mg by mouth daily.   0   NOVOLIN 70/30 FLEXPEN (70-30) 100 UNIT/ML PEN Inject 60 Units into the skin in the morning and at bedtime.      pantoprazole (PROTONIX) 40 MG tablet Take 40 mg by mouth 2 (two) times daily.   0   spironolactone (ALDACTONE) 50 MG tablet Take 50 mg by mouth daily.     vitamin E 400 UNIT capsule Take 400 Units by mouth daily.     alprazolam (XANAX) 2 MG tablet Take 1 tablet (2 mg total) by mouth in the morning, at noon, in the evening, and at bedtime. 120 tablet 5   QUEtiapine (SEROQUEL) 200 MG tablet TAKE 1 TO 2 TABLETS BY MOUTH AT BEDTIME AS NEEDED FOR  SEVERE  INSOMNIA 180 tablet 1   selegiline (ELDEPRYL) 5 MG tablet TAKE 1 & 1/2 TABLETS BY MOUTH EVERY MORNING 135 tablet 1   No current facility-administered medications for this visit.    Facility-Administered Medications Ordered in Other Visits  Medication Dose Route Frequency Provider Last Rate Last Admin   0.9 %  sodium chloride infusion   Intravenous Once Ralene Ok, MD        Medication Side Effects: Insomnia  Allergies:  Allergies  Allergen Reactions   Canagliflozin Itching and Other (See Comments)    Yeast infections  pancreatitis Yeast infections  pancreatitis Yeast infections   Cyclobenzaprine Anaphylaxis and Other (See Comments)    REACTION:  Not compatible with Emsam patch---not taking this patch any longer    Gabapentin Other (See Comments)    Other reaction(s): shortness of breath/tardive dyskinesia   Tanzeum [Albiglutide] Other (See Comments)    PANCREATITIS   Hydrocodone-Acetaminophen Hives    REACTION: pt turns red and has hot flashes    Iodinated Contrast Media Rash    1 EPISODE, RELIEVED WITH BENADRYL   Iohexol Hives    After CT a/p. Gave him Benadryl 25mg  PO for fewer than 10 hives on face/neck.  No swelling or airway issues. Gypsy Lore, RN After CT a/p. Gave him Benadryl 25mg  PO for fewer than 10 hives on face/neck.  No swelling or airway issues. Gypsy Lore, RN   Metformin And Related Diarrhea   Nsaids Diarrhea and Other (See Comments)    Other reaction(s): stomach upset Stomach Upset  Other reaction(s): stomach upset Stomach Upset Stomach Upset   Other Other (See Comments)   Vicodin [Hydrocodone-Acetaminophen] Hives and Other (See Comments)    Other reaction(s): hives/itching (although he is able to tolerate Percocet) REACTION: pt turns red and has hot flashes     Past Medical History:  Diagnosis Date   Anxiety    takes Xanax daily as needed   Blood dyscrasia    thrombocytopenia    Blood transfusion    platelets   C. difficile colitis 03/2015   Depression    Emsam patch daily   Diabetes mellitus     Type II    GERD (gastroesophageal reflux disease)    takes Protonix daily   Hepatitis  hepatitis b/ newly diagnosed  with portal hypertension   Hepatitis B virus infection 03/2007   History of kidney stones    passed- x 2   History of shingles    Hyperlipidemia    takes AtorvaStatin daily   Hypertension    takes Atenolol and Lisinopril daily   Obstructive sleep apnea (adult) (pediatric)    mild with AHI 11.61/hr now on CPAP at 15cm h2o, CPAP is used q night    Osteoarthritis    Pancreatitis    Sciatica    lumbar region - 2010, also has had numerous injections    Splenomegaly    LOV Dr Lamonte Sakai  12/12 on chart   Thrombocytopenia ([place])     Family History  Problem Relation Age of Onset   Cancer Mother     Social History   Socioeconomic History   Marital status: Married    Spouse name: Not on file   Number of children: Not on file   Years of education: Not on file   Highest education level: Not on file  Occupational History   Not on file  Tobacco Use   Smoking status: Former    Packs/day: 1.00    Years: 15.00    Pack years: 15.00    Types: Cigarettes    Quit date: 12/31/1985    Years since quitting: 35.3   Smokeless tobacco: Never  Vaping Use   Vaping Use: Never used  Substance and Sexual Activity   Alcohol use: No   Drug use: No   Sexual activity: Not on file  Other Topics Concern   Not on file  Social History Narrative   Not on file   Social Determinants of Health   Financial Resource Strain: Not on file  Food Insecurity: Not on file  Transportation Needs: Not on file  Physical Activity: Not on file  Stress: Not on file  Social Connections: Not on file  Intimate Partner Violence: Not on file    Past Medical History, Surgical history, Social history, and Family history were reviewed and updated as appropriate.   Please see review of systems for further details on the patient's review from today.   Objective:   Physical Exam:  There were no vitals taken for this visit.  Physical Exam Constitutional:      General: He is not in acute distress. Musculoskeletal:         General: No deformity.  Neurological:     Mental Status: He is alert and oriented to person, place, and time.     Cranial Nerves: No dysarthria.     Coordination: Coordination normal.  Psychiatric:        Attention and Perception: Attention and perception normal. He does not perceive auditory or visual hallucinations.        Mood and Affect: Mood is anxious. Mood is not depressed. Affect is not labile, blunt, angry or inappropriate.        Speech: Speech normal.        Behavior: Behavior normal. Behavior is cooperative.        Thought Content: Thought content normal. Thought content is not paranoid or delusional. Thought content does not include homicidal or suicidal ideation. Thought content does not include suicidal plan.        Cognition and Memory: Cognition and memory normal.        Judgment: Judgment normal.     Comments: Insight intact Depression managed Anxiety is improved with increase alprazolam    Lab Review:  Component Value Date/Time   NA 135 07/21/2019 1125   K 4.3 07/21/2019 1125   CL 103 07/21/2019 1125   CO2 26 07/21/2019 1125   GLUCOSE 195 (H) 07/21/2019 1125   BUN 11 07/21/2019 1125   CREATININE 0.85 07/21/2019 1125   CALCIUM 8.9 07/21/2019 1125   PROT 6.2 (L) 07/21/2019 1125   ALBUMIN 3.0 (L) 07/21/2019 1125   AST 27 07/21/2019 1125   ALT 29 07/21/2019 1125   ALKPHOS 84 07/21/2019 1125   BILITOT 1.1 07/21/2019 1125   GFRNONAA >60 07/21/2019 1125   GFRAA >60 07/21/2019 1125       Component Value Date/Time   WBC 3.2 (L) 07/21/2019 1125   RBC 4.28 07/21/2019 1125   HGB 12.6 (L) 07/21/2019 1125   HGB 13.7 01/22/2011 1348   HCT 37.9 (L) 07/21/2019 1125   HCT 40.1 01/22/2011 1348   PLT 41 (L) 07/21/2019 1125   PLT 59 (L) 01/22/2011 1348   MCV 88.6 07/21/2019 1125   MCV 87.9 01/22/2011 1348   MCH 29.4 07/21/2019 1125   MCHC 33.2 07/21/2019 1125   RDW 14.8 07/21/2019 1125   RDW 13.9 01/22/2011 1348   LYMPHSABS 1.2 04/22/2015 1423   LYMPHSABS  1.1 01/22/2011 1348   MONOABS 0.3 04/22/2015 1423   MONOABS 0.3 01/22/2011 1348   EOSABS 0.1 04/22/2015 1423   EOSABS 0.1 01/22/2011 1348   BASOSABS 0.0 04/22/2015 1423   BASOSABS 0.0 01/22/2011 1348    No results found for: POCLITH, LITHIUM   No results found for: PHENYTOIN, PHENOBARB, VALPROATE, CBMZ   .res Assessment: Plan:    Arjan was seen today for follow-up, depression and sleeping problem.  Diagnoses and all orders for this visit:  Major depressive disorder, recurrent episode, moderate (HCC) -     selegiline (ELDEPRYL) 5 MG tablet; TAKE 1 & 1/2 TABLETS BY MOUTH EVERY MORNING  Insomnia due to mental condition -     alprazolam (XANAX) 2 MG tablet; Take 1 tablet (2 mg total) by mouth in the morning, at noon, in the evening, and at bedtime. -     QUEtiapine (SEROQUEL) 200 MG tablet; TAKE 1 TO 2 TABLETS BY MOUTH AT BEDTIME AS NEEDED FOR  SEVERE  INSOMNIA  Panic disorder with agoraphobia -     alprazolam (XANAX) 2 MG tablet; Take 1 tablet (2 mg total) by mouth in the morning, at noon, in the evening, and at bedtime.  Generalized anxiety disorder -     alprazolam (XANAX) 2 MG tablet; Take 1 tablet (2 mg total) by mouth in the morning, at noon, in the evening, and at bedtime.   Greater than 50% of 30 min face to face time with patient was spent on counseling and coordination of care. We discussed Patient with a long history of recurrent major depression panic disorder generalized anxiety disorder and insomnia.  He had a good response to selegiline over a period of number of years but stopped it in 2017 due to expense of the Emsam patch.  .  He is failed multiple other SSRIs and SNRIs.  His depression has generally responded to oral selegiline.  His anxiety is improved with the increase in alprazolam to the maximum 2 mg 4 times daily.  He is tolerating it well.    Panic and anxiety symptoms are pretty well controlled at present.  We used the selegiline tablets in place of the  patch as it is more affordable.  He started selegiline 5 mg half twice daily for  3 days then 1 twice dail.  We discussed side effects in detail including MAO inhibitors restrictions regarding medication interactions.  Unfortunately he had significant problems with insomnia which is a known side effect possibility and some tension.   However his depression and energy are improved and he's satisfied with meds. Insomnia was a problem and he's not sure if it's worse on selegiline 10 mg selegiline vs 7.5 mg AM.    Insomnia resolved with quetiapine for ahwile but had to increase to 400 mg nightly.  We discussed the use of good Rx as a way to get around high cost of quetiapine with his insurance.  Continue alprazolam to 2 mg 4 times daily.He is aware that this is a high dose and of asked him not to take other sedatives at night.  We discussed the fall risk and that given his low platelet count if he has a fall he is at increased risk of complications of bleeding.  He understands this risk.  He states it is not overly sedating to him when he takes it during the day. Does not appear sedated.  Disc increased risk with opiates. No abuse evidence.  Continue selegiline 7.5 mg each morning DT SE insomnia.  He had worsening symptoms of depression and anxiety when he reduced the dose to 5 mg daily.  Discussed potential metabolic side effects associated with atypical antipsychotics, as well as potential risk for movement side effects. Advised pt to contact office if movement side effects occur.   We discussed the short-term risks associated with benzodiazepines including sedation and increased fall risk among others.  Discussed long-term side effect risk including dependence, potential withdrawal symptoms, and the potential eventual dose-related risk of dementia.  Again discussed MAO inhibitor restrictions and especially drug interaction issues.  FU 6 months  Lynder Parents, MD, DFAPA    Please see After Visit  Summary for patient specific instructions.  No future appointments.  No orders of the defined types were placed in this encounter.      -------------------------------

## 2021-04-14 DIAGNOSIS — I10 Essential (primary) hypertension: Secondary | ICD-10-CM | POA: Diagnosis not present

## 2021-04-14 DIAGNOSIS — R053 Chronic cough: Secondary | ICD-10-CM | POA: Diagnosis not present

## 2021-05-02 DIAGNOSIS — K7460 Unspecified cirrhosis of liver: Secondary | ICD-10-CM | POA: Diagnosis not present

## 2021-05-02 DIAGNOSIS — E538 Deficiency of other specified B group vitamins: Secondary | ICD-10-CM | POA: Diagnosis not present

## 2021-05-02 DIAGNOSIS — D696 Thrombocytopenia, unspecified: Secondary | ICD-10-CM | POA: Diagnosis not present

## 2021-05-02 DIAGNOSIS — E611 Iron deficiency: Secondary | ICD-10-CM | POA: Diagnosis not present

## 2021-05-02 DIAGNOSIS — D72819 Decreased white blood cell count, unspecified: Secondary | ICD-10-CM | POA: Diagnosis not present

## 2021-05-04 DIAGNOSIS — R188 Other ascites: Secondary | ICD-10-CM | POA: Diagnosis not present

## 2021-05-04 DIAGNOSIS — K766 Portal hypertension: Secondary | ICD-10-CM | POA: Diagnosis not present

## 2021-05-04 DIAGNOSIS — K580 Irritable bowel syndrome with diarrhea: Secondary | ICD-10-CM | POA: Diagnosis not present

## 2021-05-04 DIAGNOSIS — D696 Thrombocytopenia, unspecified: Secondary | ICD-10-CM | POA: Diagnosis not present

## 2021-05-04 DIAGNOSIS — K7460 Unspecified cirrhosis of liver: Secondary | ICD-10-CM | POA: Diagnosis not present

## 2021-05-04 DIAGNOSIS — I8510 Secondary esophageal varices without bleeding: Secondary | ICD-10-CM | POA: Diagnosis not present

## 2021-05-04 DIAGNOSIS — Z6841 Body Mass Index (BMI) 40.0 and over, adult: Secondary | ICD-10-CM | POA: Diagnosis not present

## 2021-05-30 DIAGNOSIS — K7460 Unspecified cirrhosis of liver: Secondary | ICD-10-CM | POA: Diagnosis not present

## 2021-05-30 DIAGNOSIS — R188 Other ascites: Secondary | ICD-10-CM | POA: Diagnosis not present

## 2021-05-30 DIAGNOSIS — R161 Splenomegaly, not elsewhere classified: Secondary | ICD-10-CM | POA: Diagnosis not present

## 2021-05-30 DIAGNOSIS — Z9049 Acquired absence of other specified parts of digestive tract: Secondary | ICD-10-CM | POA: Diagnosis not present

## 2021-06-02 DIAGNOSIS — L249 Irritant contact dermatitis, unspecified cause: Secondary | ICD-10-CM | POA: Diagnosis not present

## 2021-06-02 DIAGNOSIS — L299 Pruritus, unspecified: Secondary | ICD-10-CM | POA: Diagnosis not present

## 2021-06-14 DIAGNOSIS — M545 Low back pain, unspecified: Secondary | ICD-10-CM | POA: Diagnosis not present

## 2021-06-14 DIAGNOSIS — Z5181 Encounter for therapeutic drug level monitoring: Secondary | ICD-10-CM | POA: Diagnosis not present

## 2021-06-14 DIAGNOSIS — G894 Chronic pain syndrome: Secondary | ICD-10-CM | POA: Diagnosis not present

## 2021-06-14 DIAGNOSIS — Z79891 Long term (current) use of opiate analgesic: Secondary | ICD-10-CM | POA: Diagnosis not present

## 2021-06-14 DIAGNOSIS — M961 Postlaminectomy syndrome, not elsewhere classified: Secondary | ICD-10-CM | POA: Diagnosis not present

## 2021-06-26 DIAGNOSIS — D696 Thrombocytopenia, unspecified: Secondary | ICD-10-CM | POA: Diagnosis not present

## 2021-06-26 DIAGNOSIS — Z794 Long term (current) use of insulin: Secondary | ICD-10-CM | POA: Diagnosis not present

## 2021-06-26 DIAGNOSIS — Z6841 Body Mass Index (BMI) 40.0 and over, adult: Secondary | ICD-10-CM | POA: Diagnosis not present

## 2021-06-26 DIAGNOSIS — R188 Other ascites: Secondary | ICD-10-CM | POA: Diagnosis not present

## 2021-06-26 DIAGNOSIS — M159 Polyosteoarthritis, unspecified: Secondary | ICD-10-CM | POA: Diagnosis not present

## 2021-06-26 DIAGNOSIS — Z885 Allergy status to narcotic agent status: Secondary | ICD-10-CM | POA: Diagnosis not present

## 2021-06-26 DIAGNOSIS — Z79899 Other long term (current) drug therapy: Secondary | ICD-10-CM | POA: Diagnosis not present

## 2021-06-26 DIAGNOSIS — K7460 Unspecified cirrhosis of liver: Secondary | ICD-10-CM | POA: Diagnosis not present

## 2021-06-26 DIAGNOSIS — E669 Obesity, unspecified: Secondary | ICD-10-CM | POA: Diagnosis not present

## 2021-06-26 DIAGNOSIS — Z9989 Dependence on other enabling machines and devices: Secondary | ICD-10-CM | POA: Diagnosis not present

## 2021-06-26 DIAGNOSIS — Z91041 Radiographic dye allergy status: Secondary | ICD-10-CM | POA: Diagnosis not present

## 2021-06-26 DIAGNOSIS — G4733 Obstructive sleep apnea (adult) (pediatric): Secondary | ICD-10-CM | POA: Diagnosis not present

## 2021-06-26 DIAGNOSIS — I8510 Secondary esophageal varices without bleeding: Secondary | ICD-10-CM | POA: Diagnosis not present

## 2021-06-26 DIAGNOSIS — K580 Irritable bowel syndrome with diarrhea: Secondary | ICD-10-CM | POA: Diagnosis not present

## 2021-06-26 DIAGNOSIS — R161 Splenomegaly, not elsewhere classified: Secondary | ICD-10-CM | POA: Diagnosis not present

## 2021-06-26 DIAGNOSIS — K766 Portal hypertension: Secondary | ICD-10-CM | POA: Diagnosis not present

## 2021-06-26 DIAGNOSIS — K317 Polyp of stomach and duodenum: Secondary | ICD-10-CM | POA: Diagnosis not present

## 2021-06-26 DIAGNOSIS — F411 Generalized anxiety disorder: Secondary | ICD-10-CM | POA: Diagnosis not present

## 2021-06-26 DIAGNOSIS — Z888 Allergy status to other drugs, medicaments and biological substances status: Secondary | ICD-10-CM | POA: Diagnosis not present

## 2021-06-26 DIAGNOSIS — K219 Gastro-esophageal reflux disease without esophagitis: Secondary | ICD-10-CM | POA: Diagnosis not present

## 2021-06-26 DIAGNOSIS — K922 Gastrointestinal hemorrhage, unspecified: Secondary | ICD-10-CM | POA: Diagnosis not present

## 2021-06-26 DIAGNOSIS — E782 Mixed hyperlipidemia: Secondary | ICD-10-CM | POA: Diagnosis not present

## 2021-06-26 DIAGNOSIS — E119 Type 2 diabetes mellitus without complications: Secondary | ICD-10-CM | POA: Diagnosis not present

## 2021-06-26 DIAGNOSIS — Z886 Allergy status to analgesic agent status: Secondary | ICD-10-CM | POA: Diagnosis not present

## 2021-06-26 DIAGNOSIS — Z87891 Personal history of nicotine dependence: Secondary | ICD-10-CM | POA: Diagnosis not present

## 2021-06-26 DIAGNOSIS — I8500 Esophageal varices without bleeding: Secondary | ICD-10-CM | POA: Diagnosis not present

## 2021-06-26 DIAGNOSIS — I10 Essential (primary) hypertension: Secondary | ICD-10-CM | POA: Diagnosis not present

## 2021-06-27 DIAGNOSIS — K317 Polyp of stomach and duodenum: Secondary | ICD-10-CM | POA: Diagnosis not present

## 2021-06-27 DIAGNOSIS — I8510 Secondary esophageal varices without bleeding: Secondary | ICD-10-CM | POA: Diagnosis not present

## 2021-06-27 DIAGNOSIS — R188 Other ascites: Secondary | ICD-10-CM | POA: Diagnosis not present

## 2021-06-27 DIAGNOSIS — K7460 Unspecified cirrhosis of liver: Secondary | ICD-10-CM | POA: Diagnosis not present

## 2021-06-30 DIAGNOSIS — K7460 Unspecified cirrhosis of liver: Secondary | ICD-10-CM | POA: Diagnosis not present

## 2021-06-30 DIAGNOSIS — R188 Other ascites: Secondary | ICD-10-CM | POA: Diagnosis not present

## 2021-07-17 DIAGNOSIS — J019 Acute sinusitis, unspecified: Secondary | ICD-10-CM | POA: Diagnosis not present

## 2021-07-17 DIAGNOSIS — Z03818 Encounter for observation for suspected exposure to other biological agents ruled out: Secondary | ICD-10-CM | POA: Diagnosis not present

## 2021-07-17 DIAGNOSIS — R059 Cough, unspecified: Secondary | ICD-10-CM | POA: Diagnosis not present

## 2021-07-24 DIAGNOSIS — R918 Other nonspecific abnormal finding of lung field: Secondary | ICD-10-CM | POA: Diagnosis not present

## 2021-07-24 DIAGNOSIS — R053 Chronic cough: Secondary | ICD-10-CM | POA: Diagnosis not present

## 2021-07-26 DIAGNOSIS — K7460 Unspecified cirrhosis of liver: Secondary | ICD-10-CM | POA: Diagnosis not present

## 2021-07-26 DIAGNOSIS — R188 Other ascites: Secondary | ICD-10-CM | POA: Diagnosis not present

## 2021-07-28 DIAGNOSIS — R188 Other ascites: Secondary | ICD-10-CM | POA: Diagnosis not present

## 2021-07-28 DIAGNOSIS — K7460 Unspecified cirrhosis of liver: Secondary | ICD-10-CM | POA: Diagnosis not present

## 2021-07-31 DIAGNOSIS — L308 Other specified dermatitis: Secondary | ICD-10-CM | POA: Diagnosis not present

## 2021-08-08 DIAGNOSIS — L270 Generalized skin eruption due to drugs and medicaments taken internally: Secondary | ICD-10-CM | POA: Diagnosis not present

## 2021-08-10 DIAGNOSIS — R188 Other ascites: Secondary | ICD-10-CM | POA: Diagnosis not present

## 2021-08-10 DIAGNOSIS — K7460 Unspecified cirrhosis of liver: Secondary | ICD-10-CM | POA: Diagnosis not present

## 2021-08-14 DIAGNOSIS — Z85828 Personal history of other malignant neoplasm of skin: Secondary | ICD-10-CM | POA: Diagnosis not present

## 2021-08-14 DIAGNOSIS — L308 Other specified dermatitis: Secondary | ICD-10-CM | POA: Diagnosis not present

## 2021-08-18 DIAGNOSIS — Z5181 Encounter for therapeutic drug level monitoring: Secondary | ICD-10-CM | POA: Diagnosis not present

## 2021-08-18 DIAGNOSIS — K7469 Other cirrhosis of liver: Secondary | ICD-10-CM | POA: Diagnosis not present

## 2021-08-18 DIAGNOSIS — K760 Fatty (change of) liver, not elsewhere classified: Secondary | ICD-10-CM | POA: Diagnosis not present

## 2021-08-18 DIAGNOSIS — E1169 Type 2 diabetes mellitus with other specified complication: Secondary | ICD-10-CM | POA: Diagnosis not present

## 2021-08-18 DIAGNOSIS — E785 Hyperlipidemia, unspecified: Secondary | ICD-10-CM | POA: Diagnosis not present

## 2021-08-18 DIAGNOSIS — E1165 Type 2 diabetes mellitus with hyperglycemia: Secondary | ICD-10-CM | POA: Diagnosis not present

## 2021-08-18 DIAGNOSIS — I152 Hypertension secondary to endocrine disorders: Secondary | ICD-10-CM | POA: Diagnosis not present

## 2021-08-18 DIAGNOSIS — E1159 Type 2 diabetes mellitus with other circulatory complications: Secondary | ICD-10-CM | POA: Diagnosis not present

## 2021-08-18 DIAGNOSIS — N521 Erectile dysfunction due to diseases classified elsewhere: Secondary | ICD-10-CM | POA: Diagnosis not present

## 2021-08-21 DIAGNOSIS — H401131 Primary open-angle glaucoma, bilateral, mild stage: Secondary | ICD-10-CM | POA: Diagnosis not present

## 2021-09-04 DIAGNOSIS — R339 Retention of urine, unspecified: Secondary | ICD-10-CM | POA: Diagnosis not present

## 2021-09-08 DIAGNOSIS — K580 Irritable bowel syndrome with diarrhea: Secondary | ICD-10-CM | POA: Diagnosis not present

## 2021-09-08 DIAGNOSIS — R188 Other ascites: Secondary | ICD-10-CM | POA: Diagnosis not present

## 2021-09-08 DIAGNOSIS — D61818 Other pancytopenia: Secondary | ICD-10-CM | POA: Diagnosis not present

## 2021-09-08 DIAGNOSIS — K7460 Unspecified cirrhosis of liver: Secondary | ICD-10-CM | POA: Diagnosis not present

## 2021-09-08 DIAGNOSIS — K317 Polyp of stomach and duodenum: Secondary | ICD-10-CM | POA: Diagnosis not present

## 2021-09-08 DIAGNOSIS — K766 Portal hypertension: Secondary | ICD-10-CM | POA: Diagnosis not present

## 2021-09-08 DIAGNOSIS — K7469 Other cirrhosis of liver: Secondary | ICD-10-CM | POA: Diagnosis not present

## 2021-09-08 DIAGNOSIS — D509 Iron deficiency anemia, unspecified: Secondary | ICD-10-CM | POA: Diagnosis not present

## 2021-09-08 DIAGNOSIS — I8510 Secondary esophageal varices without bleeding: Secondary | ICD-10-CM | POA: Diagnosis not present

## 2021-09-12 DIAGNOSIS — R188 Other ascites: Secondary | ICD-10-CM | POA: Diagnosis not present

## 2021-09-12 DIAGNOSIS — D61818 Other pancytopenia: Secondary | ICD-10-CM | POA: Diagnosis not present

## 2021-09-12 DIAGNOSIS — K580 Irritable bowel syndrome with diarrhea: Secondary | ICD-10-CM | POA: Diagnosis not present

## 2021-09-12 DIAGNOSIS — K766 Portal hypertension: Secondary | ICD-10-CM | POA: Diagnosis not present

## 2021-09-12 DIAGNOSIS — D509 Iron deficiency anemia, unspecified: Secondary | ICD-10-CM | POA: Diagnosis not present

## 2021-09-12 DIAGNOSIS — I8510 Secondary esophageal varices without bleeding: Secondary | ICD-10-CM | POA: Diagnosis not present

## 2021-09-12 DIAGNOSIS — K317 Polyp of stomach and duodenum: Secondary | ICD-10-CM | POA: Diagnosis not present

## 2021-09-12 DIAGNOSIS — K7460 Unspecified cirrhosis of liver: Secondary | ICD-10-CM | POA: Diagnosis not present

## 2021-09-13 DIAGNOSIS — I8510 Secondary esophageal varices without bleeding: Secondary | ICD-10-CM | POA: Diagnosis not present

## 2021-09-13 DIAGNOSIS — R188 Other ascites: Secondary | ICD-10-CM | POA: Diagnosis not present

## 2021-09-13 DIAGNOSIS — D696 Thrombocytopenia, unspecified: Secondary | ICD-10-CM | POA: Diagnosis not present

## 2021-09-13 DIAGNOSIS — K766 Portal hypertension: Secondary | ICD-10-CM | POA: Diagnosis not present

## 2021-09-13 DIAGNOSIS — K317 Polyp of stomach and duodenum: Secondary | ICD-10-CM | POA: Diagnosis not present

## 2021-09-13 DIAGNOSIS — R140 Abdominal distension (gaseous): Secondary | ICD-10-CM | POA: Diagnosis not present

## 2021-09-13 DIAGNOSIS — K580 Irritable bowel syndrome with diarrhea: Secondary | ICD-10-CM | POA: Diagnosis not present

## 2021-09-13 DIAGNOSIS — D509 Iron deficiency anemia, unspecified: Secondary | ICD-10-CM | POA: Diagnosis not present

## 2021-09-13 DIAGNOSIS — D61818 Other pancytopenia: Secondary | ICD-10-CM | POA: Diagnosis not present

## 2021-09-13 DIAGNOSIS — K7460 Unspecified cirrhosis of liver: Secondary | ICD-10-CM | POA: Diagnosis not present

## 2021-09-25 DIAGNOSIS — N400 Enlarged prostate without lower urinary tract symptoms: Secondary | ICD-10-CM | POA: Diagnosis not present

## 2021-09-26 DIAGNOSIS — Z1331 Encounter for screening for depression: Secondary | ICD-10-CM | POA: Diagnosis not present

## 2021-09-26 DIAGNOSIS — Z125 Encounter for screening for malignant neoplasm of prostate: Secondary | ICD-10-CM | POA: Diagnosis not present

## 2021-09-26 DIAGNOSIS — K7460 Unspecified cirrhosis of liver: Secondary | ICD-10-CM | POA: Diagnosis not present

## 2021-09-26 DIAGNOSIS — Z794 Long term (current) use of insulin: Secondary | ICD-10-CM | POA: Diagnosis not present

## 2021-09-26 DIAGNOSIS — I10 Essential (primary) hypertension: Secondary | ICD-10-CM | POA: Diagnosis not present

## 2021-09-26 DIAGNOSIS — E1169 Type 2 diabetes mellitus with other specified complication: Secondary | ICD-10-CM | POA: Diagnosis not present

## 2021-09-26 DIAGNOSIS — Z0000 Encounter for general adult medical examination without abnormal findings: Secondary | ICD-10-CM | POA: Diagnosis not present

## 2021-09-26 DIAGNOSIS — Z6841 Body Mass Index (BMI) 40.0 and over, adult: Secondary | ICD-10-CM | POA: Diagnosis not present

## 2021-09-26 DIAGNOSIS — K219 Gastro-esophageal reflux disease without esophagitis: Secondary | ICD-10-CM | POA: Diagnosis not present

## 2021-09-26 DIAGNOSIS — E782 Mixed hyperlipidemia: Secondary | ICD-10-CM | POA: Diagnosis not present

## 2021-09-26 DIAGNOSIS — D696 Thrombocytopenia, unspecified: Secondary | ICD-10-CM | POA: Diagnosis not present

## 2021-09-27 DIAGNOSIS — K7460 Unspecified cirrhosis of liver: Secondary | ICD-10-CM | POA: Diagnosis not present

## 2021-09-27 DIAGNOSIS — D61818 Other pancytopenia: Secondary | ICD-10-CM | POA: Diagnosis not present

## 2021-09-27 DIAGNOSIS — R188 Other ascites: Secondary | ICD-10-CM | POA: Diagnosis not present

## 2021-10-04 DIAGNOSIS — K317 Polyp of stomach and duodenum: Secondary | ICD-10-CM | POA: Diagnosis not present

## 2021-10-04 DIAGNOSIS — B1910 Unspecified viral hepatitis B without hepatic coma: Secondary | ICD-10-CM | POA: Diagnosis not present

## 2021-10-04 DIAGNOSIS — Z87891 Personal history of nicotine dependence: Secondary | ICD-10-CM | POA: Diagnosis not present

## 2021-10-04 DIAGNOSIS — K3189 Other diseases of stomach and duodenum: Secondary | ICD-10-CM | POA: Diagnosis not present

## 2021-10-04 DIAGNOSIS — D61818 Other pancytopenia: Secondary | ICD-10-CM | POA: Diagnosis not present

## 2021-10-04 DIAGNOSIS — K7460 Unspecified cirrhosis of liver: Secondary | ICD-10-CM | POA: Diagnosis not present

## 2021-10-04 DIAGNOSIS — K766 Portal hypertension: Secondary | ICD-10-CM | POA: Diagnosis not present

## 2021-10-04 DIAGNOSIS — Z85828 Personal history of other malignant neoplasm of skin: Secondary | ICD-10-CM | POA: Diagnosis not present

## 2021-10-04 DIAGNOSIS — E1169 Type 2 diabetes mellitus with other specified complication: Secondary | ICD-10-CM | POA: Diagnosis not present

## 2021-10-04 DIAGNOSIS — Z8616 Personal history of COVID-19: Secondary | ICD-10-CM | POA: Diagnosis not present

## 2021-10-04 DIAGNOSIS — K219 Gastro-esophageal reflux disease without esophagitis: Secondary | ICD-10-CM | POA: Diagnosis not present

## 2021-10-04 DIAGNOSIS — I10 Essential (primary) hypertension: Secondary | ICD-10-CM | POA: Diagnosis not present

## 2021-10-04 DIAGNOSIS — G4733 Obstructive sleep apnea (adult) (pediatric): Secondary | ICD-10-CM | POA: Diagnosis not present

## 2021-10-04 DIAGNOSIS — Z886 Allergy status to analgesic agent status: Secondary | ICD-10-CM | POA: Diagnosis not present

## 2021-10-04 DIAGNOSIS — Z6841 Body Mass Index (BMI) 40.0 and over, adult: Secondary | ICD-10-CM | POA: Diagnosis not present

## 2021-10-04 DIAGNOSIS — M159 Polyosteoarthritis, unspecified: Secondary | ICD-10-CM | POA: Diagnosis not present

## 2021-10-04 DIAGNOSIS — Z91041 Radiographic dye allergy status: Secondary | ICD-10-CM | POA: Diagnosis not present

## 2021-10-04 DIAGNOSIS — D509 Iron deficiency anemia, unspecified: Secondary | ICD-10-CM | POA: Diagnosis not present

## 2021-10-04 DIAGNOSIS — K580 Irritable bowel syndrome with diarrhea: Secondary | ICD-10-CM | POA: Diagnosis not present

## 2021-10-04 DIAGNOSIS — I8510 Secondary esophageal varices without bleeding: Secondary | ICD-10-CM | POA: Diagnosis not present

## 2021-10-04 DIAGNOSIS — E782 Mixed hyperlipidemia: Secondary | ICD-10-CM | POA: Diagnosis not present

## 2021-10-04 DIAGNOSIS — R188 Other ascites: Secondary | ICD-10-CM | POA: Diagnosis not present

## 2021-10-04 DIAGNOSIS — F411 Generalized anxiety disorder: Secondary | ICD-10-CM | POA: Diagnosis not present

## 2021-10-04 DIAGNOSIS — D696 Thrombocytopenia, unspecified: Secondary | ICD-10-CM | POA: Diagnosis not present

## 2021-10-05 DIAGNOSIS — K317 Polyp of stomach and duodenum: Secondary | ICD-10-CM | POA: Diagnosis not present

## 2021-10-12 ENCOUNTER — Other Ambulatory Visit: Payer: Self-pay | Admitting: Psychiatry

## 2021-10-12 DIAGNOSIS — F4001 Agoraphobia with panic disorder: Secondary | ICD-10-CM

## 2021-10-12 DIAGNOSIS — F411 Generalized anxiety disorder: Secondary | ICD-10-CM

## 2021-10-12 DIAGNOSIS — F5105 Insomnia due to other mental disorder: Secondary | ICD-10-CM

## 2021-10-17 DIAGNOSIS — M7989 Other specified soft tissue disorders: Secondary | ICD-10-CM | POA: Diagnosis not present

## 2021-10-17 DIAGNOSIS — Z96652 Presence of left artificial knee joint: Secondary | ICD-10-CM | POA: Diagnosis not present

## 2021-10-18 ENCOUNTER — Ambulatory Visit (INDEPENDENT_AMBULATORY_CARE_PROVIDER_SITE_OTHER): Payer: Medicare Other | Admitting: Psychiatry

## 2021-10-18 ENCOUNTER — Encounter: Payer: Self-pay | Admitting: Psychiatry

## 2021-10-18 DIAGNOSIS — F331 Major depressive disorder, recurrent, moderate: Secondary | ICD-10-CM

## 2021-10-18 DIAGNOSIS — F5105 Insomnia due to other mental disorder: Secondary | ICD-10-CM | POA: Diagnosis not present

## 2021-10-18 DIAGNOSIS — F4001 Agoraphobia with panic disorder: Secondary | ICD-10-CM

## 2021-10-18 DIAGNOSIS — F411 Generalized anxiety disorder: Secondary | ICD-10-CM

## 2021-10-18 MED ORDER — ALPRAZOLAM 2 MG PO TABS: 2.0000 mg | ORAL_TABLET | Freq: Four times a day (QID) | ORAL | 5 refills | Status: DC

## 2021-10-18 MED ORDER — QUETIAPINE FUMARATE 200 MG PO TABS: ORAL_TABLET | ORAL | 1 refills | Status: DC

## 2021-10-18 MED ORDER — SELEGILINE HCL 5 MG PO TABS: ORAL_TABLET | ORAL | 1 refills | Status: DC

## 2021-10-18 NOTE — Progress Notes (Signed)
Victor [name] [id number] 10/11/1960 61 y.o.   Subjective:   Patient ID:  Victor [name] is a 61 y.o. (DOB 11/30/1960) male.  Chief Complaint:  Chief Complaint  Patient presents with   Follow-up   Depression    Depression        Associated symptoms include fatigue.  Associated symptoms include no decreased concentration and no suicidal ideas.  Past medical history includes anxiety.   Anxiety Patient reports no confusion, decreased concentration, nervous/anxious behavior or suicidal ideas.     Victor [name] presents for follow-up of his psychiatric conditions today.  At visit Jul 04, 2018.  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  At  visit June 2020.  He has had problems with insomnia related to selegiline apparently.  We reduced the dose to 7.5 mg and gave it all in the morning.  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  He had benefit from selegiline 10 mg and then 7.5 mg but SE insomnia.  We also added low-dose quetiapine at night to help with sleep and potentially with depression as well.   seen August 2020.  He ended up using hydroxyzine instead of quetiapine for sleep.  He was satisfied with his meds overall.  Anxiety and depression were improved but not resolved on selegiline 7.5 mg every morning.  No meds were changed. Off and on taking selegiline 10 mg am about 4 days weekly and other days 7.5 mg am.  It has clearly helped the depression which is under control.  Xanax helps the anxiety.  seen November 2020.  The following med decisions were made: Increase hydroxyzine 50 mg to 2 HS for sleep. Continue selegiline 7.5 mg each morning DT SE insomnia.  He had worsening symptoms of depression and anxiety when he reduced the dose to 5 mg daily.   04/29/19 appt without med changes and following noted: B-in-law died at 37 at [facility] with Covid.  Pt having problems with low platelets.  Further workup ongoing.  Sometimess feels  funny in afternoon with 7.5 mg selegiline and will intermittently reduce the dose.  Still CO trouble with sleep. Drowsy if still in the afternoon. Still issues with sleep and then gets mad he can't go to sleep.  3-4 hours of sleep total.  No caffeine after lunch.  No napping.  Says quetiapine didn't help sleep but is taking hydroxyzine 50 and asks to increase it. He dropped the selegiline to 7.5 mg but still had SE.  Then reduced to 5 mg and did OK for awhile but then gradually more depression with stressors.   Tolerated it fine this time.  It is helping.  Less anxious.  Has helped the depression until the other things occurred. Was doing fine until Covid.  Is high risk with workup for leukemia with low WBC.  That's working against him.  10/28/19 appt with the following noted: They expect me to die.  Severe nonalcoholic cirrhosis.  Requiring ascites drainage.  Severe blood abnormalities.  Not ready to die.  Doctor said to get his affairs in order.  Trying to get wife ready and it's very hard.  Hard to handle.  No timeline on lifespan.  Doesn't expect to be eligible for liver transplant.   Has had 27 surgeries.  Wonders about blood cancer too bc platelet count is so low.  Esophageal varices required surgery. Has to take it one day at a time.  Wake at night thinking about it  and having NM about it. No concerns about psych meds from medical perspective per the doctors.   Restrictive diet. Taking Xanax TID and feels like he needs another in the middle of the night.  It calms him way down but doesn't make him sleep.  Will have heart racing fear and the Xanax takes it down. Takes it 730, 12 noon and 6:30 and then needs another in middle of night. No energy due to illness. Plan: Increase alprazolam to 2 mg 4 times daily. Because hydroxyzine was not helpful he was encouraged to stop it.  04/26/20 appt noted: Don't go anywhere bc immune system.  Doing OK.   Says quetiapine went from $30 to $120 for  3 mos  supplytaking 200 mg HS.  Sleep is better and pretty good overall.  To bed 11 and awaken 3 a No SE. nd 530 and up 6 and getting 6 hours which is good. Helped more than anything else. There is better overall mood and anxiety wise than in a long time.  Satisfied with meds and does not want changes. Plan no med changes  10/13/20 appt noted: TKR 2 weeks ago.  OK so far except pain.  This has interfered with sleep but is getting better. Overall feels good for what he's been through and satisfied with each of med and tolerating well without excessive sedation. Pt reports that mood is Anxious and Depressed and both are improved with the selegiline. Extra Xanax made a big difference with anxiety.  No napping.   Anxiety symptoms include: Excessive Worry,.  Pt reports that appetite is decreased. Pt reports that energy is improved and improved. Concentration is improved. Suicidal thoughts:  denied by patient. . Suicidal thoughts:  denied by patient. Plan: cont selegiline 7.5 mg AM and Seroquel 200 mg HS  04/13/21 appt noted: Had to increase Seroquel to 400 mg HS and Selegiline 7.5 mg AM Sleep better with Seroquel 400 mg HS and taking Xanax.   Depression is managed "you can't get a whole lot better". Anxiety is manageable. No SE Uses CPAP  10/18/21 appt noted: Doing ok with meds. Dep and anxiety are controlled.  Sleep is ok usually . Seroquel to 400 mg HS and Selegiline 7.5 mg AM Still some knee problems after surgery.   No increase in pain meds usually.       No caffeine.   Uses CPAP except lately because of having nose cancer surgery.  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 Gets tired and drowsy in the day without sleep.  No caffeine after 9 pm.    Past Psychiatric Medication Trials: Trazodone, Wellbutrin, mirtazapine, duloxetine, paroxetine, citalopram, Vivactil, selegiline (Emsam) for several years with good response until 2016  and stopped DT cost,   quetiapine 100, Xanax 2 mg at night then to 4 times  daily, Sonata, Lunesta, Ambien, Rozerem, hydroxyzine hs NR  Review of Systems:  Review of Systems  Constitutional:  Positive for fatigue.  Musculoskeletal:  Positive for arthralgias, back pain, gait problem and joint swelling.  Neurological:  Negative for tremors.  Psychiatric/Behavioral:  Negative for agitation, behavioral problems, confusion, decreased concentration, dysphoric mood, hallucinations, self-injury, sleep disturbance and suicidal ideas. The patient is not nervous/anxious and is not hyperactive.     Medications: I have reviewed the patient's current medications.  Current Outpatient Medications  Medication Sig Dispense Refill   alprazolam (XANAX) 2 MG tablet TAKE 1 TABLET (2 MG TOTAL) BY MOUTH IN THE MORNING, AT NOON, IN THE EVENING, AND AT BEDTIME.  120 tablet 0   atenolol (TENORMIN) 25 MG tablet Take 25 mg by mouth daily.      atorvastatin (LIPITOR) 40 MG tablet Take 40 mg by mouth daily before breakfast.      Cyanocobalamin (VITAMIN B 12 PO) Take 1,000 mcg by mouth daily.     ferrous sulfate 325 (65 FE) MG tablet Take 325 mg by mouth daily with breakfast.     furosemide (LASIX) 20 MG tablet Take 20 mg by mouth daily.     HYDROmorphone (DILAUDID) 8 MG tablet Take 1 tablet (8 mg total) by mouth every 4 (four) hours as needed for moderate pain or severe pain. 40 tablet 0   insulin regular (NOVOLIN R) 100 units/mL injection Inject 18 Units into the skin daily with lunch.     lisinopril (ZESTRIL) 10 MG tablet Take 10 mg by mouth daily.   0   NOVOLIN 70/30 FLEXPEN (70-30) 100 UNIT/ML PEN Inject 60 Units into the skin in the morning and at bedtime.      pantoprazole (PROTONIX) 40 MG tablet Take 40 mg by mouth 2 (two) times daily.   0   QUEtiapine (SEROQUEL) 200 MG tablet TAKE 1 TO 2 TABLETS BY MOUTH AT BEDTIME AS NEEDED FOR  SEVERE  INSOMNIA 180 tablet 1   selegiline (ELDEPRYL) 5 MG tablet TAKE 1 & 1/2 TABLETS BY MOUTH EVERY MORNING 135 tablet 1   spironolactone (ALDACTONE) 50 MG  tablet Take 50 mg by mouth daily.     vitamin E 400 UNIT capsule Take 400 Units by mouth daily.     No current facility-administered medications for this visit.   Facility-Administered Medications Ordered in Other Visits  Medication Dose Route Frequency Provider Last Rate Last Admin   0.9 %  sodium chloride infusion   Intravenous Once Ralene Ok, MD        Medication Side Effects: Insomnia  Allergies:  Allergies  Allergen Reactions   Canagliflozin Itching and Other (See Comments)    Yeast infections  pancreatitis Yeast infections  pancreatitis Yeast infections   Cyclobenzaprine Anaphylaxis and Other (See Comments)    REACTION:  Not compatible with Emsam patch---not taking this patch any longer    Gabapentin Other (See Comments)    Other reaction(s): shortness of breath/tardive dyskinesia   Tanzeum [Albiglutide] Other (See Comments)    PANCREATITIS   Hydrocodone-Acetaminophen Hives    REACTION: pt turns red and has hot flashes    Iodinated Contrast Media Rash    1 EPISODE, RELIEVED WITH BENADRYL   Iohexol Hives    After CT a/p. Gave him Benadryl '25mg'$  PO for fewer than 10 hives on face/neck.  No swelling or airway issues. Gypsy Lore, RN After CT a/p. Gave him Benadryl '25mg'$  PO for fewer than 10 hives on face/neck.  No swelling or airway issues. Gypsy Lore, RN   Metformin And Related Diarrhea   Nsaids Diarrhea and Other (See Comments)    Other reaction(s): stomach upset Stomach Upset  Other reaction(s): stomach upset Stomach Upset Stomach Upset   Other Other (See Comments)   Vicodin [Hydrocodone-Acetaminophen] Hives and Other (See Comments)    Other reaction(s): hives/itching (although he is able to tolerate Percocet) REACTION: pt turns red and has hot flashes     Past Medical History:  Diagnosis Date   Anxiety    takes Xanax daily as needed   Blood dyscrasia    thrombocytopenia    Blood transfusion    platelets   C. difficile colitis 03/2015  Depression     Emsam patch daily   Diabetes mellitus     Type II    GERD (gastroesophageal reflux disease)    takes Protonix daily   Hepatitis    hepatitis b/ newly diagnosed with portal hypertension   Hepatitis B virus infection 03/2007   History of kidney stones    passed- x 2   History of shingles    Hyperlipidemia    takes AtorvaStatin daily   Hypertension    takes Atenolol and Lisinopril daily   Obstructive sleep apnea (adult) (pediatric)    mild with AHI 11.61/hr now on CPAP at 15cm h2o, CPAP is used q night    Osteoarthritis    Pancreatitis    Sciatica    lumbar region - 2010, also has had numerous injections    Splenomegaly    LOV Dr Lamonte Sakai  12/12 on chart   Thrombocytopenia ([place])     Family History  Problem Relation Age of Onset   Cancer Mother     Social History   Socioeconomic History   Marital status: Married    Spouse name: Not on file   Number of children: Not on file   Years of education: Not on file   Highest education level: Not on file  Occupational History   Not on file  Tobacco Use   Smoking status: Former    Packs/day: 1.00    Years: 15.00    Total pack years: 15.00    Types: Cigarettes    Quit date: 12/31/1985    Years since quitting: 35.8   Smokeless tobacco: Never  Vaping Use   Vaping Use: Never used  Substance and Sexual Activity   Alcohol use: No   Drug use: No   Sexual activity: Not on file  Other Topics Concern   Not on file  Social History Narrative   Not on file   Social Determinants of Health   Financial Resource Strain: Not on file  Food Insecurity: Not on file  Transportation Needs: Not on file  Physical Activity: Not on file  Stress: Not on file  Social Connections: Not on file  Intimate Partner Violence: Not on file    Past Medical History, Surgical history, Social history, and Family history were reviewed and updated as appropriate.   Please see review of systems for further details on the patient's review from today.    Objective:   Physical Exam:  There were no vitals taken for this visit.  Physical Exam Constitutional:      General: He is not in acute distress. Musculoskeletal:        General: No deformity.  Neurological:     Mental Status: He is alert and oriented to person, place, and time.     Cranial Nerves: No dysarthria.     Coordination: Coordination normal.  Psychiatric:        Attention and Perception: Attention and perception normal. He does not perceive auditory or visual hallucinations.        Mood and Affect: Mood is anxious. Mood is not depressed. Affect is not labile, blunt, angry or inappropriate.        Speech: Speech normal.        Behavior: Behavior normal. Behavior is cooperative.        Thought Content: Thought content normal. Thought content is not paranoid or delusional. Thought content does not include homicidal or suicidal ideation. Thought content does not include suicidal plan.  Cognition and Memory: Cognition and memory normal.        Judgment: Judgment normal.     Comments: Insight intact Depression managed Anxiety is improved with increase alprazolam  And sleep is good     Lab Review:     Component Value Date/Time   NA 135 07/21/2019 1125   K 4.3 07/21/2019 1125   CL 103 07/21/2019 1125   CO2 26 07/21/2019 1125   GLUCOSE 195 (H) 07/21/2019 1125   BUN 11 07/21/2019 1125   CREATININE 0.85 07/21/2019 1125   CALCIUM 8.9 07/21/2019 1125   PROT 6.2 (L) 07/21/2019 1125   ALBUMIN 3.0 (L) 07/21/2019 1125   AST 27 07/21/2019 1125   ALT 29 07/21/2019 1125   ALKPHOS 84 07/21/2019 1125   BILITOT 1.1 07/21/2019 1125   GFRNONAA >60 07/21/2019 1125   GFRAA >60 07/21/2019 1125       Component Value Date/Time   WBC 3.2 (L) 07/21/2019 1125   RBC 4.28 07/21/2019 1125   HGB 12.6 (L) 07/21/2019 1125   HGB 13.7 01/22/2011 1348   HCT 37.9 (L) 07/21/2019 1125   HCT 40.1 01/22/2011 1348   PLT 41 (L) 07/21/2019 1125   PLT 59 (L) 01/22/2011 1348   MCV 88.6  07/21/2019 1125   MCV 87.9 01/22/2011 1348   MCH 29.4 07/21/2019 1125   MCHC 33.2 07/21/2019 1125   RDW 14.8 07/21/2019 1125   RDW 13.9 01/22/2011 1348   LYMPHSABS 1.2 04/22/2015 1423   LYMPHSABS 1.1 01/22/2011 1348   MONOABS 0.3 04/22/2015 1423   MONOABS 0.3 01/22/2011 1348   EOSABS 0.1 04/22/2015 1423   EOSABS 0.1 01/22/2011 1348   BASOSABS 0.0 04/22/2015 1423   BASOSABS 0.0 01/22/2011 1348    No results found for: "POCLITH", "LITHIUM"   No results found for: "PHENYTOIN", "PHENOBARB", "VALPROATE", "CBMZ"   .res Assessment: Plan:    Chanze was seen today for follow-up and depression.  Diagnoses and all orders for this visit:  Major depressive disorder, recurrent episode, moderate (HCC)  Generalized anxiety disorder  Insomnia due to mental condition  Panic disorder with agoraphobia   Greater than 50% of 30 min face to face time with patient was spent on counseling and coordination of care. We discussed Patient with a long history of recurrent major depression panic disorder generalized anxiety disorder and insomnia.  He had a good response to selegiline over a period of number of years but stopped it in 2017 due to expense of the Emsam patch.  .  He is failed multiple other SSRIs and SNRIs.  His depression has generally responded to oral selegiline.  His anxiety is improved with the increase in alprazolam to the maximum 2 mg 4 times daily.  He is tolerating it well.    Panic and anxiety symptoms are pretty well controlled at present.  We used the selegiline tablets in place of the patch as it is more affordable.  He started selegiline 5 mg half twice daily for 3 days then 1 twice dail.  We discussed side effects in detail including MAO inhibitors restrictions regarding medication interactions.  Unfortunately he had significant problems with insomnia which is a known side effect possibility and some tension.   However his depression and energy are improved and he's satisfied  with meds. Insomnia was a problem and he's not sure if it's worse on selegiline 10 mg selegiline vs 7.5 mg AM.    Insomnia resolved with quetiapine for ahwile but had to increase to 400  mg nightly.  We discussed the use of good Rx as a way to get around high cost of quetiapine with his insurance.  Continue alprazolam to 2 mg 4 times daily.He is aware that this is a high dose and of asked him not to take other sedatives at night.  We discussed the fall risk and that given his low platelet count if he has a fall he is at increased risk of complications of bleeding.  He understands this risk.  He states it is not overly sedating to him when he takes it during the day. Does not appear sedated.  Disc increased risk with opiates. No abuse evidence.  Continue selegiline 7.5 mg each morning DT SE insomnia.  He had worsening symptoms of depression and anxiety when he reduced the dose to 5 mg daily. Disc DDI with tramadol and avoid this.  Discussed potential metabolic side effects associated with atypical antipsychotics, as well as potential risk for movement side effects. Advised pt to contact office if movement side effects occur.   We discussed the short-term risks associated with benzodiazepines including sedation and increased fall risk among others.  Discussed long-term side effect risk including dependence, potential withdrawal symptoms, and the potential eventual dose-related risk of dementia.  Again discussed MAO inhibitor restrictions and especially drug interaction issues.  FU 6 months  Lynder Parents, MD, DFAPA    Please see After Visit Summary for patient specific instructions.  No future appointments.  No orders of the defined types were placed in this encounter.      -------------------------------

## 2021-10-24 DIAGNOSIS — G894 Chronic pain syndrome: Secondary | ICD-10-CM | POA: Diagnosis not present

## 2021-10-24 DIAGNOSIS — Z79891 Long term (current) use of opiate analgesic: Secondary | ICD-10-CM | POA: Diagnosis not present

## 2021-10-24 DIAGNOSIS — M545 Low back pain, unspecified: Secondary | ICD-10-CM | POA: Diagnosis not present

## 2021-10-30 DIAGNOSIS — M5451 Vertebrogenic low back pain: Secondary | ICD-10-CM | POA: Diagnosis not present

## 2021-10-31 DIAGNOSIS — K7460 Unspecified cirrhosis of liver: Secondary | ICD-10-CM | POA: Diagnosis not present

## 2021-10-31 DIAGNOSIS — R188 Other ascites: Secondary | ICD-10-CM | POA: Diagnosis not present

## 2021-11-02 ENCOUNTER — Telehealth: Payer: Self-pay | Admitting: *Deleted

## 2021-11-02 NOTE — Chronic Care Management (AMB) (Signed)
  Care Coordination   Note   11/02/2021 Name: Victor [name] MRN: [id number] DOB: 30-Dec-1960  [name] is a 61 y.o. year old male who sees Antony Contras, MD for primary care. I reached out to [name] by phone today to offer care coordination services.  Mr. Mellin was given information about Care Coordination services today including:   The Care Coordination services include support from the care team which includes your Nurse Coordinator, Clinical Social Worker, or Pharmacist.  The Care Coordination team is here to help remove barriers to the health concerns and goals most important to you. Care Coordination services are voluntary, and the patient may decline or stop services at any time by request to their care team member.   Care Coordination Consent Status: Patient did not agree to participate in care coordination services at this time.    Encounter Outcome:  Pt. Refused  [place]  Direct Dial: [phone]

## 2021-11-03 ENCOUNTER — Telehealth: Payer: Self-pay | Admitting: *Deleted

## 2021-11-03 DIAGNOSIS — K7460 Unspecified cirrhosis of liver: Secondary | ICD-10-CM | POA: Diagnosis not present

## 2021-11-03 DIAGNOSIS — R188 Other ascites: Secondary | ICD-10-CM | POA: Diagnosis not present

## 2021-11-03 NOTE — Telephone Encounter (Signed)
   Pre-operative Risk Assessment    Patient Name: Victor [name]  DOB: [birth year] MRN: [id number]      Request for Surgical Clearance    Procedure:   SPINAL CORD STIMULATOR BATTERY REPLACEMENT  Date of Surgery:  Clearance TBD                                 Surgeon:  DR. [facility] Surgeon's Group or Practice Name:  [facility] Phone number:  [phone] Fax number:  [phone] ATTN: Orson Slick   Type of Clearance Requested:   - Medical ; NO MEDICATIONS LISTED AS NEEDING TO BE HELD   Type of Anesthesia:  General    Additional requests/questions:    Victor [name]   11/03/2021, 12:08 PM

## 2021-11-06 ENCOUNTER — Telehealth: Payer: Self-pay | Admitting: *Deleted

## 2021-11-06 NOTE — Telephone Encounter (Signed)
I s/w the pt and he is agreeable to plan of care for tele pre op aptp 11/13/21 @ 2:40. Med rec and consent are done.

## 2021-11-06 NOTE — Telephone Encounter (Signed)
I s/w the pt and he is agreeable to plan of care for tele pre op aptp 11/13/21 @ 2:40. Med rec and consent are done.      Patient Consent for Virtual Visit        Victor [name] has provided verbal consent on 11/06/2021 for a virtual visit (video or telephone).   CONSENT FOR VIRTUAL VISIT FOR:  Victor [name]  By participating in this virtual visit I agree to the following:  I hereby voluntarily request, consent and authorize [facility] and its employed or contracted physicians, physician assistants, nurse practitioners or other licensed health care professionals (the Practitioner), to provide me with telemedicine health care services (the "Services") as deemed necessary by the treating Practitioner. I acknowledge and consent to receive the Services by the Practitioner via telemedicine. I understand that the telemedicine visit will involve communicating with the Practitioner through live audiovisual communication technology and the disclosure of certain medical information by electronic transmission. I acknowledge that I have been given the opportunity to request an in-person assessment or other available alternative prior to the telemedicine visit and am voluntarily participating in the telemedicine visit.  I understand that I have the right to withhold or withdraw my consent to the use of telemedicine in the course of my care at any time, without affecting my right to future care or treatment, and that the Practitioner or I may terminate the telemedicine visit at any time. I understand that I have the right to inspect all information obtained and/or recorded in the course of the telemedicine visit and may receive copies of available information for a reasonable fee.  I understand that some of the potential risks of receiving the Services via telemedicine include:  Delay or interruption in medical evaluation due to technological equipment failure or disruption; Information transmitted may  not be sufficient (e.g. poor resolution of images) to allow for appropriate medical decision making by the Practitioner; and/or  In rare instances, security protocols could fail, causing a breach of personal health information.  Furthermore, I acknowledge that it is my responsibility to provide information about my medical history, conditions and care that is complete and accurate to the best of my ability. I acknowledge that Practitioner's advice, recommendations, and/or decision may be based on factors not within their control, such as incomplete or inaccurate data provided by me or distortions of diagnostic images or specimens that may result from electronic transmissions. I understand that the practice of medicine is not an exact science and that Practitioner makes no warranties or guarantees regarding treatment outcomes. I acknowledge that a copy of this consent can be made available to me via my patient portal ([facility]), or I can request a printed copy by calling the office of [facility].    I understand that my insurance will be billed for this visit.   I have read or had this consent read to me. I understand the contents of this consent, which adequately explains the benefits and risks of the Services being provided via telemedicine.  I have been provided ample opportunity to ask questions regarding this consent and the Services and have had my questions answered to my satisfaction. I give my informed consent for the services to be provided through the use of telemedicine in my medical care

## 2021-11-06 NOTE — Telephone Encounter (Signed)
   Name: Victor [name]  DOB: September 20, 1960  MRN: [id number]  Primary Cardiologist: None   Preoperative team, please contact this patient and set up a phone call appointment for further preoperative risk assessment. Please obtain consent and complete medication review. Thank you for your help.  I confirm that guidance regarding antiplatelet and oral anticoagulation therapy has been completed and, if necessary, noted below (none requested/N/A).    Lenna Sciara, NP 11/06/2021, 10:59 AM [place]

## 2021-11-12 NOTE — Progress Notes (Unsigned)
Virtual Visit via Telephone Note   Because of Victor [name] co-morbid illnesses, he is at least at moderate risk for complications without adequate follow up.  This format is felt to be most appropriate for this patient at this time.  The patient did not have access to video technology/had technical difficulties with video requiring transitioning to audio format only (telephone).  All issues noted in this document were discussed and addressed.  No physical exam could be performed with this format.  Please refer to the patient's chart for his consent to telehealth for [facility].  Evaluation Performed:  Preoperative cardiovascular risk assessment _____________   Date:  11/12/2021   Patient ID:  ANCHOR DWAN, DOB 1960-05-15, MRN [id number] Patient Location:  Home Provider location:   Office  Primary Care Provider:  Antony Contras, MD Primary Cardiologist:  None  Chief Complaint / Patient Profile   61 y.o. y/o male with a h/o OSA on CPAP, obesity, type 2 diabetes, HTN, and HLD who is pending spinal cord stimulator battery replacement and presents today for telephonic preoperative cardiovascular risk assessment.  Past Medical History    Past Medical History:  Diagnosis Date   Anxiety    takes Xanax daily as needed   Blood dyscrasia    thrombocytopenia    Blood transfusion    platelets   C. difficile colitis 03/2015   Depression    Emsam patch daily   Diabetes mellitus     Type II    GERD (gastroesophageal reflux disease)    takes Protonix daily   Hepatitis    hepatitis b/ newly diagnosed with portal hypertension   Hepatitis B virus infection 03/2007   History of kidney stones    passed- x 2   History of shingles    Hyperlipidemia    takes AtorvaStatin daily   Hypertension    takes Atenolol and Lisinopril daily   Obstructive sleep apnea (adult) (pediatric)    mild with AHI 11.61/hr now on CPAP at 15cm h2o, CPAP is used q night    Osteoarthritis     Pancreatitis    Sciatica    lumbar region - 2010, also has had numerous injections    Splenomegaly    LOV Dr Lamonte Sakai  12/12 on chart   Thrombocytopenia [facility])    Past Surgical History:  Procedure Laterality Date   BACK SURGERY     X9   CHOLECYSTECTOMY N/A 04/26/2015   Procedure: LAPAROSCOPIC CHOLECYSTECTOMY;  Surgeon: Ralene Ok, MD;  Location: [place];  Service: General;  Laterality: N/A;   COLONOSCOPY W/ POLYPECTOMY     HAND SURGERY Right    right hand x 2- injured   KNEE SURGERY     5 surgeries to right knee, 3 surgeries to left knee   KYPHOPLASTY     LUMBAR LAMINECTOMY/DECOMPRESSION MICRODISCECTOMY  02/19/2011   Procedure: LUMBAR LAMINECTOMY/DECOMPRESSION MICRODISCECTOMY;  Surgeon: Johnn Hai;  Location: [facility];  Service: Orthopedics;  Laterality: Left;  decompression l5-s1 l4-5 on left   SHOULDER ARTHROSCOPY WITH SUBACROMIAL DECOMPRESSION Right 11/30/2016   Procedure: Right shoulder arthroscopy, extensive debridement and subacromial decompression;  Surgeon: Nicholes Stairs, MD;  Location: [place];  Service: Orthopedics;  Laterality: Right;  80   SHOULDER SURGERY Left    left shoulder, Rotar Cuff   SPINAL CORD STIMULATOR BATTERY EXCHANGE N/A 03/14/2016   Procedure: Revision of spinal cord stimulator battery;  Surgeon: Melina Schools, MD;  Location: [place];  Service: Orthopedics;  Laterality: N/A;  Requests 1 hour   SPINAL CORD STIMULATOR INSERTION N/A 02/02/2015   Procedure: LUMBAR SPINAL CORD STIMULATOR INSERTION;  Surgeon: Melina Schools, MD;  Location: [place];  Service: Orthopedics;  Laterality: N/A;    Allergies  Allergies  Allergen Reactions   Canagliflozin Itching and Other (See Comments)    Yeast infections  pancreatitis Yeast infections  pancreatitis Yeast infections   Cyclobenzaprine Anaphylaxis and Other (See Comments)    REACTION:  Not compatible with Emsam patch---not taking this patch any longer    Gabapentin Other (See Comments)    Other reaction(s):  shortness of breath/tardive dyskinesia   Tanzeum [Albiglutide] Other (See Comments)    PANCREATITIS   Hydrocodone-Acetaminophen Hives    REACTION: pt turns red and has hot flashes    Iodinated Contrast Media Rash    1 EPISODE, RELIEVED WITH BENADRYL   Iohexol Hives    After CT a/p. Gave him Benadryl '25mg'$  PO for fewer than 10 hives on face/neck.  No swelling or airway issues. Gypsy Lore, RN After CT a/p. Gave him Benadryl '25mg'$  PO for fewer than 10 hives on face/neck.  No swelling or airway issues. Gypsy Lore, RN   Metformin And Related Diarrhea   Nsaids Diarrhea and Other (See Comments)    Other reaction(s): stomach upset Stomach Upset  Other reaction(s): stomach upset Stomach Upset Stomach Upset   Other Other (See Comments)   Vicodin [Hydrocodone-Acetaminophen] Hives and Other (See Comments)    Other reaction(s): hives/itching (although he is able to tolerate Percocet) REACTION: pt turns red and has hot flashes     History of Present Illness    Victor [name] is a 61 y.o. male who presents via audio/video conferencing for a telehealth visit today.  Pt was last seen in cardiology clinic on 04/12/21 by Dr. Radford Pax.  At that time KENICHI CASSADA was doing well.  The patient is now pending procedure as outlined above. Since his last visit, he *** denies chest pain, shortness of breath, lower extremity edema, fatigue, palpitations, melena, hematuria, hemoptysis, diaphoresis, weakness, presyncope, syncope, orthopnea, and PND.    Home Medications    Prior to Admission medications   Medication Sig Start Date End Date Taking? Authorizing Provider  alprazolam Duanne Moron) 2 MG tablet Take 1 tablet (2 mg total) by mouth in the morning, at noon, in the evening, and at bedtime. 10/18/21   Cottle, Billey Co., MD  atenolol (TENORMIN) 25 MG tablet Take 25 mg by mouth daily.     [provider]  atorvastatin (LIPITOR) 40 MG tablet Take 40 mg by mouth daily before breakfast.     [provider]   Cyanocobalamin (VITAMIN B 12 PO) Take 1,000 mcg by mouth daily.    [provider]  ferrous sulfate 325 (65 FE) MG tablet Take 325 mg by mouth daily with breakfast.    [provider]  furosemide (LASIX) 20 MG tablet Take 20 mg by mouth daily.    [provider]  HYDROmorphone (DILAUDID) 8 MG tablet Take 1 tablet (8 mg total) by mouth every 4 (four) hours as needed for moderate pain or severe pain. 11/30/16   Nicholes Stairs, MD  insulin regular (NOVOLIN R) 100 units/mL injection Inject 18 Units into the skin daily with lunch.    [provider]  lisinopril (ZESTRIL) 10 MG tablet Take 10 mg by mouth daily.  02/04/16   [provider]  NOVOLIN 70/30 FLEXPEN (70-30) 100 UNIT/ML PEN Inject 60 Units into the  skin in the morning and at bedtime.  10/29/18   [provider]  pantoprazole (PROTONIX) 40 MG tablet Take 40 mg by mouth 2 (two) times daily.  11/30/15   [provider]  QUEtiapine (SEROQUEL) 200 MG tablet TAKE 1 TO 2 TABLETS BY MOUTH AT BEDTIME AS NEEDED FOR  SEVERE  INSOMNIA 10/18/21   Cottle, Billey Co., MD  selegiline (ELDEPRYL) 5 MG tablet TAKE 1 & 1/2 TABLETS BY MOUTH EVERY MORNING 10/18/21   Cottle, Billey Co., MD  spironolactone (ALDACTONE) 50 MG tablet Take 50 mg by mouth daily.    [provider]  vitamin E 400 UNIT capsule Take 400 Units by mouth daily.    [provider]    Physical Exam    Vital Signs:  GABERIEL YOUNGBLOOD does not have vital signs available for review today.***  Given telephonic nature of communication, physical exam is limited. AAOx3. NAD. Normal affect.  Speech and respirations are unlabored.  Accessory Clinical Findings    None  Assessment & Plan    1.  Preoperative Cardiovascular Risk Assessment:  The patient was advised that if he develops new symptoms prior to surgery to contact our office to arrange for a follow-up visit, and he verbalized understanding.  (Reminder:  Include SBE prophylaxis/Antiplatelet/Anticoag Instructions***)  A copy of this note will be routed to requesting surgeon.  Time:   Today, I have spent *** minutes with the patient with telehealth technology discussing medical history, symptoms, and management plan.     Emmaline Life, NP  11/12/2021, 3:34 PM

## 2021-11-13 ENCOUNTER — Ambulatory Visit: Payer: Medicare Other | Attending: Internal Medicine | Admitting: Nurse Practitioner

## 2021-11-13 ENCOUNTER — Encounter: Payer: Self-pay | Admitting: Nurse Practitioner

## 2021-11-13 DIAGNOSIS — Z01810 Encounter for preprocedural cardiovascular examination: Secondary | ICD-10-CM

## 2021-11-15 DIAGNOSIS — K7460 Unspecified cirrhosis of liver: Secondary | ICD-10-CM | POA: Diagnosis not present

## 2021-11-15 DIAGNOSIS — D696 Thrombocytopenia, unspecified: Secondary | ICD-10-CM | POA: Diagnosis not present

## 2021-11-15 DIAGNOSIS — I8510 Secondary esophageal varices without bleeding: Secondary | ICD-10-CM | POA: Diagnosis not present

## 2021-11-15 DIAGNOSIS — K7682 Hepatic encephalopathy: Secondary | ICD-10-CM | POA: Diagnosis not present

## 2021-11-15 DIAGNOSIS — R188 Other ascites: Secondary | ICD-10-CM | POA: Diagnosis not present

## 2021-12-13 DIAGNOSIS — Z9049 Acquired absence of other specified parts of digestive tract: Secondary | ICD-10-CM | POA: Diagnosis not present

## 2021-12-13 DIAGNOSIS — K7682 Hepatic encephalopathy: Secondary | ICD-10-CM | POA: Diagnosis not present

## 2021-12-13 DIAGNOSIS — I8510 Secondary esophageal varices without bleeding: Secondary | ICD-10-CM | POA: Diagnosis not present

## 2021-12-13 DIAGNOSIS — R161 Splenomegaly, not elsewhere classified: Secondary | ICD-10-CM | POA: Diagnosis not present

## 2021-12-13 DIAGNOSIS — D696 Thrombocytopenia, unspecified: Secondary | ICD-10-CM | POA: Diagnosis not present

## 2021-12-13 DIAGNOSIS — R188 Other ascites: Secondary | ICD-10-CM | POA: Diagnosis not present

## 2021-12-13 DIAGNOSIS — K7460 Unspecified cirrhosis of liver: Secondary | ICD-10-CM | POA: Diagnosis not present

## 2021-12-18 DIAGNOSIS — D696 Thrombocytopenia, unspecified: Secondary | ICD-10-CM | POA: Diagnosis not present

## 2021-12-19 DIAGNOSIS — Z79899 Other long term (current) drug therapy: Secondary | ICD-10-CM | POA: Diagnosis not present

## 2021-12-19 DIAGNOSIS — K7460 Unspecified cirrhosis of liver: Secondary | ICD-10-CM | POA: Diagnosis not present

## 2021-12-19 DIAGNOSIS — I8510 Secondary esophageal varices without bleeding: Secondary | ICD-10-CM | POA: Diagnosis not present

## 2021-12-19 DIAGNOSIS — K7682 Hepatic encephalopathy: Secondary | ICD-10-CM | POA: Diagnosis not present

## 2021-12-19 DIAGNOSIS — Z87891 Personal history of nicotine dependence: Secondary | ICD-10-CM | POA: Diagnosis not present

## 2021-12-19 DIAGNOSIS — Z881 Allergy status to other antibiotic agents status: Secondary | ICD-10-CM | POA: Diagnosis not present

## 2021-12-19 DIAGNOSIS — K317 Polyp of stomach and duodenum: Secondary | ICD-10-CM | POA: Diagnosis not present

## 2021-12-19 DIAGNOSIS — E782 Mixed hyperlipidemia: Secondary | ICD-10-CM | POA: Diagnosis not present

## 2021-12-19 DIAGNOSIS — K219 Gastro-esophageal reflux disease without esophagitis: Secondary | ICD-10-CM | POA: Diagnosis not present

## 2021-12-19 DIAGNOSIS — I10 Essential (primary) hypertension: Secondary | ICD-10-CM | POA: Diagnosis not present

## 2021-12-19 DIAGNOSIS — G4733 Obstructive sleep apnea (adult) (pediatric): Secondary | ICD-10-CM | POA: Diagnosis not present

## 2021-12-19 DIAGNOSIS — Z794 Long term (current) use of insulin: Secondary | ICD-10-CM | POA: Diagnosis not present

## 2021-12-19 DIAGNOSIS — E1169 Type 2 diabetes mellitus with other specified complication: Secondary | ICD-10-CM | POA: Diagnosis not present

## 2021-12-19 DIAGNOSIS — K3189 Other diseases of stomach and duodenum: Secondary | ICD-10-CM | POA: Diagnosis not present

## 2021-12-19 DIAGNOSIS — I8500 Esophageal varices without bleeding: Secondary | ICD-10-CM | POA: Diagnosis not present

## 2021-12-19 DIAGNOSIS — K7581 Nonalcoholic steatohepatitis (NASH): Secondary | ICD-10-CM | POA: Diagnosis not present

## 2021-12-19 DIAGNOSIS — Z886 Allergy status to analgesic agent status: Secondary | ICD-10-CM | POA: Diagnosis not present

## 2021-12-19 DIAGNOSIS — E119 Type 2 diabetes mellitus without complications: Secondary | ICD-10-CM | POA: Diagnosis not present

## 2021-12-19 DIAGNOSIS — D696 Thrombocytopenia, unspecified: Secondary | ICD-10-CM | POA: Diagnosis not present

## 2021-12-19 DIAGNOSIS — R188 Other ascites: Secondary | ICD-10-CM | POA: Diagnosis not present

## 2021-12-19 DIAGNOSIS — B1910 Unspecified viral hepatitis B without hepatic coma: Secondary | ICD-10-CM | POA: Diagnosis not present

## 2021-12-19 DIAGNOSIS — Z6841 Body Mass Index (BMI) 40.0 and over, adult: Secondary | ICD-10-CM | POA: Diagnosis not present

## 2021-12-19 DIAGNOSIS — Z885 Allergy status to narcotic agent status: Secondary | ICD-10-CM | POA: Diagnosis not present

## 2021-12-19 DIAGNOSIS — K7469 Other cirrhosis of liver: Secondary | ICD-10-CM | POA: Diagnosis not present

## 2021-12-19 DIAGNOSIS — Z85828 Personal history of other malignant neoplasm of skin: Secondary | ICD-10-CM | POA: Diagnosis not present

## 2021-12-19 DIAGNOSIS — M159 Polyosteoarthritis, unspecified: Secondary | ICD-10-CM | POA: Diagnosis not present

## 2021-12-19 DIAGNOSIS — Z888 Allergy status to other drugs, medicaments and biological substances status: Secondary | ICD-10-CM | POA: Diagnosis not present

## 2021-12-19 DIAGNOSIS — K766 Portal hypertension: Secondary | ICD-10-CM | POA: Diagnosis not present

## 2021-12-22 DIAGNOSIS — E611 Iron deficiency: Secondary | ICD-10-CM | POA: Diagnosis not present

## 2021-12-22 DIAGNOSIS — D61818 Other pancytopenia: Secondary | ICD-10-CM | POA: Diagnosis not present

## 2021-12-22 DIAGNOSIS — K7460 Unspecified cirrhosis of liver: Secondary | ICD-10-CM | POA: Diagnosis not present

## 2021-12-22 DIAGNOSIS — E538 Deficiency of other specified B group vitamins: Secondary | ICD-10-CM | POA: Diagnosis not present

## 2021-12-22 DIAGNOSIS — Z23 Encounter for immunization: Secondary | ICD-10-CM | POA: Diagnosis not present

## 2022-01-01 DIAGNOSIS — Z1159 Encounter for screening for other viral diseases: Secondary | ICD-10-CM | POA: Diagnosis not present

## 2022-01-01 DIAGNOSIS — D696 Thrombocytopenia, unspecified: Secondary | ICD-10-CM | POA: Diagnosis not present

## 2022-01-01 DIAGNOSIS — J069 Acute upper respiratory infection, unspecified: Secondary | ICD-10-CM | POA: Diagnosis not present

## 2022-01-02 DIAGNOSIS — B9689 Other specified bacterial agents as the cause of diseases classified elsewhere: Secondary | ICD-10-CM | POA: Diagnosis not present

## 2022-01-02 DIAGNOSIS — J069 Acute upper respiratory infection, unspecified: Secondary | ICD-10-CM | POA: Diagnosis not present

## 2022-01-02 DIAGNOSIS — R051 Acute cough: Secondary | ICD-10-CM | POA: Diagnosis not present

## 2022-01-08 DIAGNOSIS — Z8659 Personal history of other mental and behavioral disorders: Secondary | ICD-10-CM | POA: Diagnosis not present

## 2022-01-08 DIAGNOSIS — I8510 Secondary esophageal varices without bleeding: Secondary | ICD-10-CM | POA: Diagnosis not present

## 2022-01-08 DIAGNOSIS — Z794 Long term (current) use of insulin: Secondary | ICD-10-CM | POA: Diagnosis not present

## 2022-01-08 DIAGNOSIS — E1169 Type 2 diabetes mellitus with other specified complication: Secondary | ICD-10-CM | POA: Diagnosis not present

## 2022-01-08 DIAGNOSIS — Z01810 Encounter for preprocedural cardiovascular examination: Secondary | ICD-10-CM | POA: Diagnosis not present

## 2022-01-08 DIAGNOSIS — R0602 Shortness of breath: Secondary | ICD-10-CM | POA: Diagnosis not present

## 2022-01-08 DIAGNOSIS — E785 Hyperlipidemia, unspecified: Secondary | ICD-10-CM | POA: Diagnosis not present

## 2022-01-08 DIAGNOSIS — K766 Portal hypertension: Secondary | ICD-10-CM | POA: Diagnosis not present

## 2022-01-08 DIAGNOSIS — K7469 Other cirrhosis of liver: Secondary | ICD-10-CM | POA: Diagnosis not present

## 2022-01-08 DIAGNOSIS — K7682 Hepatic encephalopathy: Secondary | ICD-10-CM | POA: Diagnosis not present

## 2022-01-08 DIAGNOSIS — I10 Essential (primary) hypertension: Secondary | ICD-10-CM | POA: Diagnosis not present

## 2022-01-08 DIAGNOSIS — D638 Anemia in other chronic diseases classified elsewhere: Secondary | ICD-10-CM | POA: Diagnosis not present

## 2022-01-08 DIAGNOSIS — Z01818 Encounter for other preprocedural examination: Secondary | ICD-10-CM | POA: Diagnosis not present

## 2022-01-08 DIAGNOSIS — Z01811 Encounter for preprocedural respiratory examination: Secondary | ICD-10-CM | POA: Diagnosis not present

## 2022-01-08 DIAGNOSIS — Z87891 Personal history of nicotine dependence: Secondary | ICD-10-CM | POA: Diagnosis not present

## 2022-01-31 DIAGNOSIS — K7460 Unspecified cirrhosis of liver: Secondary | ICD-10-CM | POA: Diagnosis not present

## 2022-01-31 DIAGNOSIS — K766 Portal hypertension: Secondary | ICD-10-CM | POA: Diagnosis not present

## 2022-01-31 DIAGNOSIS — R188 Other ascites: Secondary | ICD-10-CM | POA: Diagnosis not present

## 2022-01-31 DIAGNOSIS — K7682 Hepatic encephalopathy: Secondary | ICD-10-CM | POA: Diagnosis not present

## 2022-01-31 DIAGNOSIS — D696 Thrombocytopenia, unspecified: Secondary | ICD-10-CM | POA: Diagnosis not present

## 2022-02-02 DIAGNOSIS — R188 Other ascites: Secondary | ICD-10-CM | POA: Diagnosis not present

## 2022-02-02 DIAGNOSIS — K7460 Unspecified cirrhosis of liver: Secondary | ICD-10-CM | POA: Diagnosis not present

## 2022-02-07 DIAGNOSIS — I152 Hypertension secondary to endocrine disorders: Secondary | ICD-10-CM | POA: Diagnosis not present

## 2022-02-07 DIAGNOSIS — E785 Hyperlipidemia, unspecified: Secondary | ICD-10-CM | POA: Diagnosis not present

## 2022-02-07 DIAGNOSIS — E1165 Type 2 diabetes mellitus with hyperglycemia: Secondary | ICD-10-CM | POA: Diagnosis not present

## 2022-02-07 DIAGNOSIS — K760 Fatty (change of) liver, not elsewhere classified: Secondary | ICD-10-CM | POA: Diagnosis not present

## 2022-02-07 DIAGNOSIS — K7469 Other cirrhosis of liver: Secondary | ICD-10-CM | POA: Diagnosis not present

## 2022-02-07 DIAGNOSIS — E1159 Type 2 diabetes mellitus with other circulatory complications: Secondary | ICD-10-CM | POA: Diagnosis not present

## 2022-02-07 DIAGNOSIS — Z5181 Encounter for therapeutic drug level monitoring: Secondary | ICD-10-CM | POA: Diagnosis not present

## 2022-02-07 DIAGNOSIS — N521 Erectile dysfunction due to diseases classified elsewhere: Secondary | ICD-10-CM | POA: Diagnosis not present

## 2022-02-07 DIAGNOSIS — E1169 Type 2 diabetes mellitus with other specified complication: Secondary | ICD-10-CM | POA: Diagnosis not present

## 2022-02-14 DIAGNOSIS — M961 Postlaminectomy syndrome, not elsewhere classified: Secondary | ICD-10-CM | POA: Diagnosis not present

## 2022-02-14 DIAGNOSIS — G8929 Other chronic pain: Secondary | ICD-10-CM | POA: Diagnosis not present

## 2022-02-14 DIAGNOSIS — Z79891 Long term (current) use of opiate analgesic: Secondary | ICD-10-CM | POA: Diagnosis not present

## 2022-02-14 DIAGNOSIS — M545 Low back pain, unspecified: Secondary | ICD-10-CM | POA: Diagnosis not present

## 2022-02-21 DIAGNOSIS — Z91199 Patient's noncompliance with other medical treatment and regimen due to unspecified reason: Secondary | ICD-10-CM | POA: Diagnosis not present

## 2022-02-24 ENCOUNTER — Other Ambulatory Visit: Payer: Self-pay | Admitting: Psychiatry

## 2022-02-24 DIAGNOSIS — F5105 Insomnia due to other mental disorder: Secondary | ICD-10-CM

## 2022-02-24 DIAGNOSIS — F4001 Agoraphobia with panic disorder: Secondary | ICD-10-CM

## 2022-02-24 DIAGNOSIS — F411 Generalized anxiety disorder: Secondary | ICD-10-CM

## 2022-02-26 ENCOUNTER — Telehealth: Payer: Self-pay | Admitting: Psychiatry

## 2022-02-26 ENCOUNTER — Telehealth: Payer: Self-pay | Admitting: Cardiology

## 2022-02-26 ENCOUNTER — Telehealth: Payer: Self-pay

## 2022-02-26 ENCOUNTER — Other Ambulatory Visit: Payer: Self-pay

## 2022-02-26 DIAGNOSIS — L821 Other seborrheic keratosis: Secondary | ICD-10-CM | POA: Diagnosis not present

## 2022-02-26 DIAGNOSIS — D1801 Hemangioma of skin and subcutaneous tissue: Secondary | ICD-10-CM | POA: Diagnosis not present

## 2022-02-26 DIAGNOSIS — Z85828 Personal history of other malignant neoplasm of skin: Secondary | ICD-10-CM | POA: Diagnosis not present

## 2022-02-26 DIAGNOSIS — F5105 Insomnia due to other mental disorder: Secondary | ICD-10-CM

## 2022-02-26 DIAGNOSIS — F4001 Agoraphobia with panic disorder: Secondary | ICD-10-CM

## 2022-02-26 DIAGNOSIS — F411 Generalized anxiety disorder: Secondary | ICD-10-CM

## 2022-02-26 DIAGNOSIS — L814 Other melanin hyperpigmentation: Secondary | ICD-10-CM | POA: Diagnosis not present

## 2022-02-26 NOTE — Telephone Encounter (Signed)
   Name: Victor [name]  DOB: 1960-05-20  MRN: [id number]  Primary Cardiologist: None   Preoperative team, please contact this patient and set up a phone call appointment for further preoperative risk assessment. Please obtain consent and complete medication review. Thank you for your help.  I confirm that guidance regarding antiplatelet and oral anticoagulation therapy has been completed and, if necessary, noted below.  None requested   Mable Fill, Marissa Nestle, NP 02/26/2022, 2:36 PM [place]

## 2022-02-26 NOTE — Telephone Encounter (Signed)
  Patient Consent for Virtual Visit         Victor [name] has provided verbal consent on 02/26/2022 for a virtual visit (video or telephone).   CONSENT FOR VIRTUAL VISIT FOR:  Victor [name]  By participating in this virtual visit I agree to the following:  I hereby voluntarily request, consent and authorize [place] and its employed or contracted physicians, physician assistants, nurse practitioners or other licensed health care professionals (the Practitioner), to provide me with telemedicine health care services (the "Services") as deemed necessary by the treating Practitioner. I acknowledge and consent to receive the Services by the Practitioner via telemedicine. I understand that the telemedicine visit will involve communicating with the Practitioner through live audiovisual communication technology and the disclosure of certain medical information by electronic transmission. I acknowledge that I have been given the opportunity to request an in-person assessment or other available alternative prior to the telemedicine visit and am voluntarily participating in the telemedicine visit.  I understand that I have the right to withhold or withdraw my consent to the use of telemedicine in the course of my care at any time, without affecting my right to future care or treatment, and that the Practitioner or I may terminate the telemedicine visit at any time. I understand that I have the right to inspect all information obtained and/or recorded in the course of the telemedicine visit and may receive copies of available information for a reasonable fee.  I understand that some of the potential risks of receiving the Services via telemedicine include:  Delay or interruption in medical evaluation due to technological equipment failure or disruption; Information transmitted may not be sufficient (e.g. poor resolution of images) to allow for appropriate medical decision making by the Practitioner;  and/or  In rare instances, security protocols could fail, causing a breach of personal health information.  Furthermore, I acknowledge that it is my responsibility to provide information about my medical history, conditions and care that is complete and accurate to the best of my ability. I acknowledge that Practitioner's advice, recommendations, and/or decision may be based on factors not within their control, such as incomplete or inaccurate data provided by me or distortions of diagnostic images or specimens that may result from electronic transmissions. I understand that the practice of medicine is not an exact science and that Practitioner makes no warranties or guarantees regarding treatment outcomes. I acknowledge that a copy of this consent can be made available to me via my patient portal ([place]), or I can request a printed copy by calling the office of [place].    I understand that my insurance will be billed for this visit.   I have read or had this consent read to me. I understand the contents of this consent, which adequately explains the benefits and risks of the Services being provided via telemedicine.  I have been provided ample opportunity to ask questions regarding this consent and the Services and have had my questions answered to my satisfaction. I give my informed consent for the services to be provided through the use of telemedicine in my medical care

## 2022-02-26 NOTE — Telephone Encounter (Signed)
Pended.

## 2022-02-26 NOTE — Telephone Encounter (Signed)
Spoke with the patients wife, ok per DPR, who states the patient dropped off a cardiac clearance letter and has not heard back. She states the requesting provider Dr Rolena Infante office has faxed over a clearance and they have not heard back from [place]. I advised her that I apologize for the inconvenience and that would contact Dr Rolena Infante office and have them refax the clearance. She thanked me and voiced understanding.    Spoke with Claiborne Billings, surgery scheduler at Dr Rolena Infante office and requested she refax over the clearance. She states they did not send over a clearance letter yet. She states Dr Rolena Infante gives them to the patients during his office visits. She states she will type one up and send it over. Pre-op fax number given.

## 2022-02-26 NOTE — Telephone Encounter (Signed)
   Pre-operative Risk Assessment    Patient Name: Victor [name]  DOB: 1960/10/25 MRN: [id number]      Request for Surgical Clearance    Procedure:  Spinal cord Stimulator battery replacement   Date of Surgery:  Clearance TBD                                 Surgeon:  Dr Melina Schools Surgeon's Group or Practice Name:  [facility] Phone number:  [phone] Fax number:  [phone] [place])   Type of Clearance Requested:   - Medical    Type of Anesthesia:  General    Additional requests/questions:  N/A  Job Founds T   02/26/2022, 2:01 PM

## 2022-02-26 NOTE — Telephone Encounter (Signed)
Next visit is 06/08/22. Rolfe is requesting a refill on his Alprazolam called to:  [place], [place]   Phone: [phone]  Fax: [phone]

## 2022-02-26 NOTE — Telephone Encounter (Signed)
Clearance letter received.   Spoke with the patients wife and made her aware that we have received the clearance and it has been put in the chart. She thanked me for calling.

## 2022-02-26 NOTE — Telephone Encounter (Signed)
Patient is calling stating he dropped off a preop clearance request last week and has not heard from our office regarding it. Please advise.

## 2022-02-26 NOTE — Telephone Encounter (Signed)
Virtual appointment scheduled. Consent given and med list reviewed. Patient agreeable and voiced understanding.

## 2022-03-05 NOTE — Progress Notes (Signed)
Virtual Visit via Telephone Note   Because of Victor [name] co-morbid illnesses, he is at least at moderate risk for complications without adequate follow up.  This format is felt to be most appropriate for this patient at this time.  The patient did not have access to video technology/had technical difficulties with video requiring transitioning to audio format only (telephone).  All issues noted in this document were discussed and addressed.  No physical exam could be performed with this format.  Please refer to the patient's chart for his consent to telehealth for [facility].  Evaluation Performed:  Preoperative cardiovascular risk assessment _____________   Date:  03/05/2022   Patient ID:  Victor [name], DOB 06/13/1960, MRN [id number] Patient Location:  Home Provider location:   Office  Primary Care Provider:  Antony Contras, MD Primary Cardiologist:  None  Chief Complaint / Patient Profile   62 y.o. y/o male with a h/o OSA, HTN, obesity, who is pending spinal cord stimulator battery replacement and presents today for telephonic preoperative cardiovascular risk assessment.  History of Present Illness    Victor [name] is a 62 y.o. male who presents via audio/video conferencing for a telehealth visit today.  Pt was last seen in cardiology clinic on 04/12/21 by Dr. Radford Pax.  At that time BODEY FRIZELL was doing well.  The patient is now pending procedure as outlined above. Since his last visit, he  denies chest pain, shortness of breath, lower extremity edema, fatigue, palpitations, melena, hematuria, hemoptysis, diaphoresis, weakness, presyncope, syncope, orthopnea, and PND. He advises his back pain prohibits any exercise or walking. He also advises that his blood sugar has been hard to regulate. He is also having some issues with his CPAP machine, he tries to adhere to wearing it but feels that he needs a new one.    Past Medical History    Past Medical History:  Diagnosis  Date   Anxiety    takes Xanax daily as needed   Blood dyscrasia    thrombocytopenia    Blood transfusion    platelets   C. difficile colitis 03/2015   Depression    Emsam patch daily   Diabetes mellitus     Type II    GERD (gastroesophageal reflux disease)    takes Protonix daily   Hepatitis    hepatitis b/ newly diagnosed with portal hypertension   Hepatitis B virus infection 03/2007   History of kidney stones    passed- x 2   History of shingles    Hyperlipidemia    takes AtorvaStatin daily   Hypertension    takes Atenolol and Lisinopril daily   Obstructive sleep apnea (adult) (pediatric)    mild with AHI 11.61/hr now on CPAP at 15cm h2o, CPAP is used q night    Osteoarthritis    Pancreatitis    Sciatica    lumbar region - 2010, also has had numerous injections    Splenomegaly    LOV Dr Lamonte Sakai  12/12 on chart   Thrombocytopenia [facility])    Past Surgical History:  Procedure Laterality Date   BACK SURGERY     X9   CHOLECYSTECTOMY N/A 04/26/2015   Procedure: LAPAROSCOPIC CHOLECYSTECTOMY;  Surgeon: Ralene Ok, MD;  Location: [place];  Service: General;  Laterality: N/A;   COLONOSCOPY W/ POLYPECTOMY     HAND SURGERY Right    right hand x 2- injured   KNEE SURGERY     5 surgeries to right knee,  3 surgeries to left knee   KYPHOPLASTY     LUMBAR LAMINECTOMY/DECOMPRESSION MICRODISCECTOMY  02/19/2011   Procedure: LUMBAR LAMINECTOMY/DECOMPRESSION MICRODISCECTOMY;  Surgeon: Johnn Hai;  Location[facility];  Service: Orthopedics;  Laterality: Left;  decompression l5-s1 l4-5 on left   SHOULDER ARTHROSCOPY WITH SUBACROMIAL DECOMPRESSION Right 11/30/2016   Procedure: Right shoulder arthroscopy, extensive debridement and subacromial decompression;  Surgeon: Nicholes Stairs, MD;  Location: [place];  Service: Orthopedics;  Laterality: Right;  80   SHOULDER SURGERY Left    left shoulder, Rotar Cuff   SPINAL CORD STIMULATOR BATTERY EXCHANGE N/A 03/14/2016   Procedure: Revision of  spinal cord stimulator battery;  Surgeon: Melina Schools, MD;  Location: [place];  Service: Orthopedics;  Laterality: N/A;  Requests 1 hour   SPINAL CORD STIMULATOR INSERTION N/A 02/02/2015   Procedure: LUMBAR SPINAL CORD STIMULATOR INSERTION;  Surgeon: Melina Schools, MD;  Location: [place];  Service: Orthopedics;  Laterality: N/A;    Allergies  Allergies  Allergen Reactions   Canagliflozin Itching and Other (See Comments)    Yeast infections  pancreatitis Yeast infections  pancreatitis Yeast infections   Cyclobenzaprine Anaphylaxis and Other (See Comments)    REACTION:  Not compatible with Emsam patch---not taking this patch any longer    Gabapentin Other (See Comments)    Other reaction(s): shortness of breath/tardive dyskinesia   Tanzeum [Albiglutide] Other (See Comments)    PANCREATITIS   Hydrocodone-Acetaminophen Hives    REACTION: pt turns red and has hot flashes    Iodinated Contrast Media Rash    1 EPISODE, RELIEVED WITH BENADRYL   Iohexol Hives    After CT a/p. Gave him Benadryl '25mg'$  PO for fewer than 10 hives on face/neck.  No swelling or airway issues. Gypsy Lore, RN After CT a/p. Gave him Benadryl '25mg'$  PO for fewer than 10 hives on face/neck.  No swelling or airway issues. Gypsy Lore, RN   Metformin And Related Diarrhea   Nsaids Diarrhea and Other (See Comments)    Other reaction(s): stomach upset Stomach Upset  Other reaction(s): stomach upset Stomach Upset Stomach Upset   Other Other (See Comments)   Vicodin [Hydrocodone-Acetaminophen] Hives and Other (See Comments)    Other reaction(s): hives/itching (although he is able to tolerate Percocet) REACTION: pt turns red and has hot flashes     Home Medications    Prior to Admission medications   Medication Sig Start Date End Date Taking? Authorizing Provider  alprazolam Duanne Moron) 2 MG tablet Take 1 tablet (2 mg total) by mouth in the morning, at noon, and at bedtime. 02/25/22   Cottle, Billey Co., MD  atenolol  (TENORMIN) 25 MG tablet Take 25 mg by mouth daily.     [provider]  atorvastatin (LIPITOR) 40 MG tablet Take 40 mg by mouth daily before breakfast.     [provider]  Cyanocobalamin (VITAMIN B 12 PO) Take 1,000 mcg by mouth daily.    [provider]  ferrous sulfate 325 (65 FE) MG tablet Take 325 mg by mouth 2 (two) times daily with a meal.    [provider]  furosemide (LASIX) 20 MG tablet Take 20 mg by mouth daily.    [provider]  HYDROmorphone (DILAUDID) 8 MG tablet Take 1 tablet (8 mg total) by mouth every 4 (four) hours as needed for moderate pain or severe pain. 11/30/16   Nicholes Stairs, MD  insulin regular (NOVOLIN R) 100 units/mL injection Inject 18 Units into the skin  daily with lunch.    [provider]  lisinopril (ZESTRIL) 10 MG tablet Take 10 mg by mouth daily.  02/04/16   [provider]  NOVOLIN 70/30 FLEXPEN (70-30) 100 UNIT/ML PEN Inject 60 Units into the skin in the morning and at bedtime.  10/29/18   [provider]  pantoprazole (PROTONIX) 40 MG tablet Take 40 mg by mouth 2 (two) times daily.  11/30/15   [provider]  QUEtiapine (SEROQUEL) 200 MG tablet TAKE 1 TO 2 TABLETS BY MOUTH AT BEDTIME AS NEEDED FOR  SEVERE  INSOMNIA 10/18/21   Cottle, Billey Co., MD  selegiline (ELDEPRYL) 5 MG tablet TAKE 1 & 1/2 TABLETS BY MOUTH EVERY MORNING 10/18/21   Cottle, Billey Co., MD  spironolactone (ALDACTONE) 50 MG tablet Take 50 mg by mouth daily.    [provider]  vitamin E 400 UNIT capsule Take 400 Units by mouth daily.    [provider]    Physical Exam    Vital Signs:  MALAHKI GASAWAY does not have vital signs available for review today.  Given telephonic nature of communication, physical exam is limited. AAOx3. NAD. Normal affect.  Speech and respirations are unlabored.  Accessory Clinical Findings    None  Assessment & Plan    1.  Preoperative Cardiovascular  Risk Assessment:  The patient was advised that if he develops new symptoms prior to surgery to contact our office to arrange for a follow-up visit, and he verbalized understanding. According to the Revised Cardiac Risk Index (RCRI), his Perioperative Risk of Major Cardiac Event is (%): 6.6 His Functional Capacity in METs is: 5.62 according to the [employer] Activity Status Index (DASI).  Therefore, based on ACC/AHA guidelines, patient would be at acceptable risk for the planned procedure without further cardiovascular testing.   A copy of this note will be routed to requesting surgeon.  Time:    Today, I have spent 10 minutes with the patient with telehealth technology discussing medical history, symptoms, and management plan.     Trudi Ida, NP

## 2022-03-09 DIAGNOSIS — I152 Hypertension secondary to endocrine disorders: Secondary | ICD-10-CM | POA: Diagnosis not present

## 2022-03-09 DIAGNOSIS — K7469 Other cirrhosis of liver: Secondary | ICD-10-CM | POA: Diagnosis not present

## 2022-03-09 DIAGNOSIS — K7682 Hepatic encephalopathy: Secondary | ICD-10-CM | POA: Diagnosis not present

## 2022-03-09 DIAGNOSIS — Z5181 Encounter for therapeutic drug level monitoring: Secondary | ICD-10-CM | POA: Diagnosis not present

## 2022-03-09 DIAGNOSIS — F331 Major depressive disorder, recurrent, moderate: Secondary | ICD-10-CM | POA: Diagnosis not present

## 2022-03-09 DIAGNOSIS — E1165 Type 2 diabetes mellitus with hyperglycemia: Secondary | ICD-10-CM | POA: Diagnosis not present

## 2022-03-09 DIAGNOSIS — E1169 Type 2 diabetes mellitus with other specified complication: Secondary | ICD-10-CM | POA: Diagnosis not present

## 2022-03-09 DIAGNOSIS — D61818 Other pancytopenia: Secondary | ICD-10-CM | POA: Diagnosis not present

## 2022-03-09 DIAGNOSIS — E1159 Type 2 diabetes mellitus with other circulatory complications: Secondary | ICD-10-CM | POA: Diagnosis not present

## 2022-03-09 DIAGNOSIS — E785 Hyperlipidemia, unspecified: Secondary | ICD-10-CM | POA: Diagnosis not present

## 2022-03-09 DIAGNOSIS — D696 Thrombocytopenia, unspecified: Secondary | ICD-10-CM | POA: Diagnosis not present

## 2022-03-12 ENCOUNTER — Ambulatory Visit: Payer: Medicare Other | Attending: Cardiology | Admitting: Cardiology

## 2022-03-12 ENCOUNTER — Encounter: Payer: Self-pay | Admitting: Cardiology

## 2022-03-12 DIAGNOSIS — Z01810 Encounter for preprocedural cardiovascular examination: Secondary | ICD-10-CM | POA: Diagnosis not present

## 2022-03-13 ENCOUNTER — Telehealth: Payer: Self-pay | Admitting: *Deleted

## 2022-03-13 NOTE — Telephone Encounter (Signed)
-----  Message from Lauralee Evener, [place] sent at 03/12/2022  2:25 PM EST -----  ----- Message ----- From: Trudi Ida, NP Sent: 03/12/2022   9:51 AM EST To: [facility] Sleep Studies  Good morning,  I spoke with Mr. Victor [name] this morning for preoperative clearance and he mentioned that his CPAP was not working correctly. He says it is spraying water in his nose and that overall, he feels he needs a new one.   Thank you for any help, Anderson Malta

## 2022-03-13 NOTE — Telephone Encounter (Signed)
Reached out to patient and encouraged him to call his dme and speak to the respiratory therapist for help with his machine. He is not eligible for a new machine until December 2024. I encouraged him to call me back with the outcome.

## 2022-03-23 DIAGNOSIS — E871 Hypo-osmolality and hyponatremia: Secondary | ICD-10-CM | POA: Diagnosis not present

## 2022-03-23 DIAGNOSIS — E1169 Type 2 diabetes mellitus with other specified complication: Secondary | ICD-10-CM | POA: Diagnosis not present

## 2022-03-23 DIAGNOSIS — I10 Essential (primary) hypertension: Secondary | ICD-10-CM | POA: Diagnosis not present

## 2022-03-23 DIAGNOSIS — K7460 Unspecified cirrhosis of liver: Secondary | ICD-10-CM | POA: Diagnosis not present

## 2022-03-23 DIAGNOSIS — K219 Gastro-esophageal reflux disease without esophagitis: Secondary | ICD-10-CM | POA: Diagnosis not present

## 2022-03-23 DIAGNOSIS — D696 Thrombocytopenia, unspecified: Secondary | ICD-10-CM | POA: Diagnosis not present

## 2022-03-23 DIAGNOSIS — G4733 Obstructive sleep apnea (adult) (pediatric): Secondary | ICD-10-CM | POA: Diagnosis not present

## 2022-03-23 DIAGNOSIS — E782 Mixed hyperlipidemia: Secondary | ICD-10-CM | POA: Diagnosis not present

## 2022-03-23 DIAGNOSIS — F411 Generalized anxiety disorder: Secondary | ICD-10-CM | POA: Diagnosis not present

## 2022-03-23 DIAGNOSIS — M1712 Unilateral primary osteoarthritis, left knee: Secondary | ICD-10-CM | POA: Diagnosis not present

## 2022-03-23 DIAGNOSIS — G4700 Insomnia, unspecified: Secondary | ICD-10-CM | POA: Diagnosis not present

## 2022-03-23 DIAGNOSIS — M545 Low back pain, unspecified: Secondary | ICD-10-CM | POA: Diagnosis not present

## 2022-03-29 DIAGNOSIS — E871 Hypo-osmolality and hyponatremia: Secondary | ICD-10-CM | POA: Diagnosis not present

## 2022-03-30 DIAGNOSIS — F331 Major depressive disorder, recurrent, moderate: Secondary | ICD-10-CM | POA: Diagnosis not present

## 2022-03-30 DIAGNOSIS — E1165 Type 2 diabetes mellitus with hyperglycemia: Secondary | ICD-10-CM | POA: Diagnosis not present

## 2022-03-30 DIAGNOSIS — I152 Hypertension secondary to endocrine disorders: Secondary | ICD-10-CM | POA: Diagnosis not present

## 2022-03-30 DIAGNOSIS — N521 Erectile dysfunction due to diseases classified elsewhere: Secondary | ICD-10-CM | POA: Diagnosis not present

## 2022-03-30 DIAGNOSIS — Z5181 Encounter for therapeutic drug level monitoring: Secondary | ICD-10-CM | POA: Diagnosis not present

## 2022-03-30 DIAGNOSIS — E1159 Type 2 diabetes mellitus with other circulatory complications: Secondary | ICD-10-CM | POA: Diagnosis not present

## 2022-03-30 DIAGNOSIS — K7682 Hepatic encephalopathy: Secondary | ICD-10-CM | POA: Diagnosis not present

## 2022-03-30 DIAGNOSIS — E785 Hyperlipidemia, unspecified: Secondary | ICD-10-CM | POA: Diagnosis not present

## 2022-03-30 DIAGNOSIS — K7469 Other cirrhosis of liver: Secondary | ICD-10-CM | POA: Diagnosis not present

## 2022-03-30 DIAGNOSIS — E1169 Type 2 diabetes mellitus with other specified complication: Secondary | ICD-10-CM | POA: Diagnosis not present

## 2022-03-30 DIAGNOSIS — D696 Thrombocytopenia, unspecified: Secondary | ICD-10-CM | POA: Diagnosis not present

## 2022-03-30 DIAGNOSIS — K760 Fatty (change of) liver, not elsewhere classified: Secondary | ICD-10-CM | POA: Diagnosis not present

## 2022-03-30 DIAGNOSIS — D61818 Other pancytopenia: Secondary | ICD-10-CM | POA: Diagnosis not present

## 2022-04-03 ENCOUNTER — Ambulatory Visit (HOSPITAL_COMMUNITY): Payer: Self-pay | Admitting: Orthopedic Surgery

## 2022-04-13 ENCOUNTER — Other Ambulatory Visit: Payer: Self-pay | Admitting: Psychiatry

## 2022-04-13 DIAGNOSIS — F5105 Insomnia due to other mental disorder: Secondary | ICD-10-CM

## 2022-04-13 DIAGNOSIS — F4001 Agoraphobia with panic disorder: Secondary | ICD-10-CM

## 2022-04-13 DIAGNOSIS — F411 Generalized anxiety disorder: Secondary | ICD-10-CM

## 2022-04-16 ENCOUNTER — Other Ambulatory Visit: Payer: Self-pay | Admitting: Psychiatry

## 2022-04-16 DIAGNOSIS — F5105 Insomnia due to other mental disorder: Secondary | ICD-10-CM

## 2022-04-18 ENCOUNTER — Ambulatory Visit: Payer: Medicare Other | Admitting: Psychiatry

## 2022-04-19 DIAGNOSIS — R188 Other ascites: Secondary | ICD-10-CM | POA: Diagnosis not present

## 2022-04-19 DIAGNOSIS — K7460 Unspecified cirrhosis of liver: Secondary | ICD-10-CM | POA: Diagnosis not present

## 2022-04-26 DIAGNOSIS — D509 Iron deficiency anemia, unspecified: Secondary | ICD-10-CM | POA: Diagnosis not present

## 2022-04-29 DIAGNOSIS — K7581 Nonalcoholic steatohepatitis (NASH): Secondary | ICD-10-CM | POA: Diagnosis not present

## 2022-04-29 DIAGNOSIS — E1169 Type 2 diabetes mellitus with other specified complication: Secondary | ICD-10-CM | POA: Diagnosis not present

## 2022-04-29 DIAGNOSIS — G4733 Obstructive sleep apnea (adult) (pediatric): Secondary | ICD-10-CM | POA: Diagnosis not present

## 2022-04-29 DIAGNOSIS — K219 Gastro-esophageal reflux disease without esophagitis: Secondary | ICD-10-CM | POA: Diagnosis not present

## 2022-04-29 DIAGNOSIS — R609 Edema, unspecified: Secondary | ICD-10-CM | POA: Diagnosis not present

## 2022-04-29 DIAGNOSIS — R161 Splenomegaly, not elsewhere classified: Secondary | ICD-10-CM | POA: Diagnosis not present

## 2022-04-29 DIAGNOSIS — Z87891 Personal history of nicotine dependence: Secondary | ICD-10-CM | POA: Diagnosis not present

## 2022-04-29 DIAGNOSIS — Z794 Long term (current) use of insulin: Secondary | ICD-10-CM | POA: Diagnosis not present

## 2022-04-29 DIAGNOSIS — R188 Other ascites: Secondary | ICD-10-CM | POA: Diagnosis not present

## 2022-04-29 DIAGNOSIS — Z881 Allergy status to other antibiotic agents status: Secondary | ICD-10-CM | POA: Diagnosis not present

## 2022-04-29 DIAGNOSIS — Z79899 Other long term (current) drug therapy: Secondary | ICD-10-CM | POA: Diagnosis not present

## 2022-04-29 DIAGNOSIS — Z91041 Radiographic dye allergy status: Secondary | ICD-10-CM | POA: Diagnosis not present

## 2022-04-29 DIAGNOSIS — K7469 Other cirrhosis of liver: Secondary | ICD-10-CM | POA: Diagnosis not present

## 2022-04-29 DIAGNOSIS — D696 Thrombocytopenia, unspecified: Secondary | ICD-10-CM | POA: Diagnosis not present

## 2022-04-29 DIAGNOSIS — R7989 Other specified abnormal findings of blood chemistry: Secondary | ICD-10-CM | POA: Diagnosis not present

## 2022-04-29 DIAGNOSIS — Z20822 Contact with and (suspected) exposure to covid-19: Secondary | ICD-10-CM | POA: Diagnosis not present

## 2022-04-29 DIAGNOSIS — I10 Essential (primary) hypertension: Secondary | ICD-10-CM | POA: Diagnosis not present

## 2022-04-29 DIAGNOSIS — I959 Hypotension, unspecified: Secondary | ICD-10-CM | POA: Diagnosis not present

## 2022-04-29 DIAGNOSIS — Z885 Allergy status to narcotic agent status: Secondary | ICD-10-CM | POA: Diagnosis not present

## 2022-04-29 DIAGNOSIS — R2243 Localized swelling, mass and lump, lower limb, bilateral: Secondary | ICD-10-CM | POA: Diagnosis not present

## 2022-04-29 DIAGNOSIS — E785 Hyperlipidemia, unspecified: Secondary | ICD-10-CM | POA: Diagnosis not present

## 2022-04-29 DIAGNOSIS — D61818 Other pancytopenia: Secondary | ICD-10-CM | POA: Diagnosis not present

## 2022-04-29 DIAGNOSIS — R0600 Dyspnea, unspecified: Secondary | ICD-10-CM | POA: Diagnosis not present

## 2022-04-29 DIAGNOSIS — R0681 Apnea, not elsewhere classified: Secondary | ICD-10-CM | POA: Diagnosis not present

## 2022-04-29 DIAGNOSIS — Z888 Allergy status to other drugs, medicaments and biological substances status: Secondary | ICD-10-CM | POA: Diagnosis not present

## 2022-04-29 DIAGNOSIS — R0602 Shortness of breath: Secondary | ICD-10-CM | POA: Diagnosis not present

## 2022-04-29 DIAGNOSIS — K7460 Unspecified cirrhosis of liver: Secondary | ICD-10-CM | POA: Diagnosis not present

## 2022-05-01 ENCOUNTER — Telehealth: Payer: Medicare Other | Admitting: Cardiology

## 2022-05-01 NOTE — Pre-Procedure Instructions (Signed)
Surgical Instructions    Your procedure is scheduled on Thursday 05/10/22.   Report to [facility] Main Entrance "A" at 05:30 A.M., then check in with the Admitting office.  Call this number if you have problems the morning of surgery:  [phone]   If you have any questions prior to your surgery date call [phone]: Open Monday-Friday 8am-4pm If you experience any cold or flu symptoms such as cough, fever, chills, shortness of breath, etc. between now and your scheduled surgery, please notify us at the above number     Remember:  Do not eat after midnight the night before your surgery  You may drink clear liquids until 04:30 A.M. the morning of your surgery.   Clear liquids allowed are: Water, Non-Citrus Juices (without pulp), Carbonated Beverages, Clear Tea, Black Coffee ONLY (NO MILK, CREAM OR POWDERED CREAMER of any kind), and Gatorade    Take these medicines the morning of surgery with A SIP OF WATER:   atenolol (TENORMIN)   atorvastatin (LIPITOR)   pantoprazole (PROTONIX)    Take these medicines if needed:   alprazolam Duanne Moron)   HYDROmorphone (DILAUDID)    As of today, STOP taking any Aspirin (unless otherwise instructed by your surgeon) Aleve, Naproxen, Ibuprofen, Motrin, Advil, Goody's, BC's, all herbal medications, fish oil, and all vitamins.  WHAT DO I DO ABOUT MY DIABETES MEDICATION?   Do not take oral diabetes medicines (pills) the morning of surgery.  The night before surgery take half of your normal dose of TRESIBA FLEXTOUCH.  Take 40 units.   The morning of surgery take half of your normal dose of TRESIBA FLEXTOUCH. Take 40 units.    The day of surgery, do not take other diabetes injectables, including Byetta (exenatide), Bydureon (exenatide ER), Victoza (liraglutide), or Trulicity (dulaglutide).  If your CBG is greater than 220 mg/dL, you may take  of your sliding scale (correction) dose of NOVOLOG FLEXPEN. If you normally take 40 units then you will take  20 units of CBG is greater than 220.    HOW TO MANAGE YOUR DIABETES BEFORE AND AFTER SURGERY  Why is it important to control my blood sugar before and after surgery? Improving blood sugar levels before and after surgery helps healing and can limit problems. A way of improving blood sugar control is eating a healthy diet by:  Eating less sugar and carbohydrates  Increasing activity/exercise  Talking with your doctor about reaching your blood sugar goals High blood sugars (greater than 180 mg/dL) can raise your risk of infections and slow your recovery, so you will need to focus on controlling your diabetes during the weeks before surgery. Make sure that the doctor who takes care of your diabetes knows about your planned surgery including the date and location.  How do I manage my blood sugar before surgery? Check your blood sugar at least 4 times a day, starting 2 days before surgery, to make sure that the level is not too high or low.  Check your blood sugar the morning of your surgery when you wake up and every 2 hours until you get to the Short Stay unit.  If your blood sugar is less than 70 mg/dL, you will need to treat for low blood sugar: Do not take insulin. Treat a low blood sugar (less than 70 mg/dL) with  cup of clear juice (cranberry or apple), 4 glucose tablets, OR glucose gel. Recheck blood sugar in 15 minutes after treatment (to make sure it is greater than 70  mg/dL). If your blood sugar is not greater than 70 mg/dL on recheck, call [phone] for further instructions. Report your blood sugar to the short stay nurse when you get to Short Stay.  If you are admitted to the hospital after surgery: Your blood sugar will be checked by the staff and you will probably be given insulin after surgery (instead of oral diabetes medicines) to make sure you have good blood sugar levels. The goal for blood sugar control after surgery is 80-180 mg/dL.           Do not wear jewelry or  makeup. Do not wear lotions, powders, perfumes/cologne or deodorant. Do not shave 48 hours prior to surgery.  Men may shave face and neck. Do not bring valuables to the hospital. Do not wear nail polish, gel polish, artificial nails, or any other type of covering on natural nails (fingers and toes) If you have artificial nails or gel coating that need to be removed by a nail salon, please have this removed prior to surgery. Artificial nails or gel coating may interfere with anesthesia's ability to adequately monitor your vital signs.  [facility] is not responsible for any belongings or valuables.    Do NOT Smoke (Tobacco/Vaping)  24 hours prior to your procedure  If you use a CPAP at night, you may bring your mask for your overnight stay.   Contacts, glasses, hearing aids, dentures or partials may not be worn into surgery, please bring cases for these belongings   For patients admitted to the hospital, discharge time will be determined by your treatment team.   Patients discharged the day of surgery will not be allowed to drive home, and someone needs to stay with them for 24 hours.   SURGICAL WAITING ROOM VISITATION Patients having surgery or a procedure may have no more than 2 support people in the waiting area - these visitors may rotate.   Children under the age of 32 must have an adult with them who is not the patient. If the patient needs to stay at the hospital during part of their recovery, the visitor guidelines for inpatient rooms apply. Pre-op nurse will coordinate an appropriate time for 1 support person to accompany patient in pre-op.  This support person may not rotate.   Please refer to [email or web address] for the visitor guidelines for Inpatients (after your surgery is over and you are in a regular room).    Special instructions:    Oral Hygiene is also important to reduce your risk of infection.  Remember - BRUSH YOUR  TEETH THE MORNING OF SURGERY WITH YOUR REGULAR TOOTHPASTE   [place]- Preparing For Surgery  Before surgery, you can play an important role. Because skin is not sterile, your skin needs to be as free of germs as possible. You can reduce the number of germs on your skin by washing with CHG (chlorahexidine gluconate) Soap before surgery.  CHG is an antiseptic cleaner which kills germs and bonds with the skin to continue killing germs even after washing.     Please do not use if you have an allergy to CHG or antibacterial soaps. If your skin becomes reddened/irritated stop using the CHG.  Do not shave (including legs and underarms) for at least 48 hours prior to first CHG shower. It is OK to shave your face.  Please follow these instructions carefully.     Shower the NIGHT BEFORE SURGERY and the MORNING OF SURGERY with CHG Soap.   If you  chose to wash your hair, wash your hair first as usual with your normal shampoo. After you shampoo, rinse your hair and body thoroughly to remove the shampoo.  Then [employer] and genitals (private parts) with your normal soap and rinse thoroughly to remove soap.  After that Use CHG Soap as you would any other liquid soap. You can apply CHG directly to the skin and wash gently with a scrungie or a clean washcloth.   Apply the CHG Soap to your body ONLY FROM THE NECK DOWN.  Do not use on open wounds or open sores. Avoid contact with your eyes, ears, mouth and genitals (private parts). Wash Face and genitals (private parts)  with your normal soap.   Wash thoroughly, paying special attention to the area where your surgery will be performed.  Thoroughly rinse your body with warm water from the neck down.  DO NOT shower/wash with your normal soap after using and rinsing off the CHG Soap.  Pat yourself dry with a CLEAN TOWEL.  Wear CLEAN PAJAMAS to bed the night before surgery  Place CLEAN SHEETS on your bed the night before your surgery  DO NOT SLEEP WITH  PETS.   Day of Surgery:  Take a shower with CHG soap. Wear Clean/Comfortable clothing the morning of surgery Do not apply any deodorants/lotions.   Remember to brush your teeth WITH YOUR REGULAR TOOTHPASTE.    If you received a COVID test during your pre-op visit, it is requested that you wear a mask when out in public, stay away from anyone that may not be feeling well, and notify your surgeon if you develop symptoms. If you have been in contact with anyone that has tested positive in the last 10 days, please notify your surgeon.    Please read over the following fact sheets that you were given.

## 2022-05-02 ENCOUNTER — Inpatient Hospital Stay (HOSPITAL_COMMUNITY)
Admission: RE | Admit: 2022-05-02 | Discharge: 2022-05-02 | Disposition: A | Discharge status: Home / Self Care | Payer: Medicare Other | Source: Ambulatory Visit

## 2022-05-04 DIAGNOSIS — R188 Other ascites: Secondary | ICD-10-CM | POA: Diagnosis not present

## 2022-05-04 DIAGNOSIS — D696 Thrombocytopenia, unspecified: Secondary | ICD-10-CM | POA: Diagnosis not present

## 2022-05-04 DIAGNOSIS — K7581 Nonalcoholic steatohepatitis (NASH): Secondary | ICD-10-CM | POA: Diagnosis not present

## 2022-05-04 DIAGNOSIS — K7460 Unspecified cirrhosis of liver: Secondary | ICD-10-CM | POA: Diagnosis not present

## 2022-05-04 DIAGNOSIS — K580 Irritable bowel syndrome with diarrhea: Secondary | ICD-10-CM | POA: Diagnosis not present

## 2022-05-04 DIAGNOSIS — I8510 Secondary esophageal varices without bleeding: Secondary | ICD-10-CM | POA: Diagnosis not present

## 2022-05-04 DIAGNOSIS — K7682 Hepatic encephalopathy: Secondary | ICD-10-CM | POA: Diagnosis not present

## 2022-05-04 DIAGNOSIS — K219 Gastro-esophageal reflux disease without esophagitis: Secondary | ICD-10-CM | POA: Diagnosis not present

## 2022-05-10 ENCOUNTER — Encounter (HOSPITAL_COMMUNITY): Admission: RE | Payer: Self-pay | Source: Home / Self Care

## 2022-05-10 ENCOUNTER — Ambulatory Visit (HOSPITAL_COMMUNITY): Admission: RE | Admit: 2022-05-10 | Payer: Medicare Other | Source: Home / Self Care | Admitting: Orthopedic Surgery

## 2022-05-10 SURGERY — SPINAL CORD STIMULATOR BATTERY EXCHANGE
Anesthesia: General

## 2022-05-13 ENCOUNTER — Other Ambulatory Visit: Payer: Self-pay | Admitting: Psychiatry

## 2022-05-13 DIAGNOSIS — F411 Generalized anxiety disorder: Secondary | ICD-10-CM

## 2022-05-13 DIAGNOSIS — F5105 Insomnia due to other mental disorder: Secondary | ICD-10-CM

## 2022-05-13 DIAGNOSIS — F4001 Agoraphobia with panic disorder: Secondary | ICD-10-CM

## 2022-05-24 ENCOUNTER — Ambulatory Visit: Payer: Medicare Other | Admitting: Cardiology

## 2022-05-25 DIAGNOSIS — J069 Acute upper respiratory infection, unspecified: Secondary | ICD-10-CM | POA: Diagnosis not present

## 2022-05-25 DIAGNOSIS — I10 Essential (primary) hypertension: Secondary | ICD-10-CM | POA: Diagnosis not present

## 2022-05-25 DIAGNOSIS — E1159 Type 2 diabetes mellitus with other circulatory complications: Secondary | ICD-10-CM | POA: Diagnosis not present

## 2022-05-25 DIAGNOSIS — K760 Fatty (change of) liver, not elsewhere classified: Secondary | ICD-10-CM | POA: Diagnosis not present

## 2022-05-25 DIAGNOSIS — N521 Erectile dysfunction due to diseases classified elsewhere: Secondary | ICD-10-CM | POA: Diagnosis not present

## 2022-05-25 DIAGNOSIS — E1169 Type 2 diabetes mellitus with other specified complication: Secondary | ICD-10-CM | POA: Diagnosis not present

## 2022-05-25 DIAGNOSIS — E1165 Type 2 diabetes mellitus with hyperglycemia: Secondary | ICD-10-CM | POA: Diagnosis not present

## 2022-05-25 DIAGNOSIS — B9689 Other specified bacterial agents as the cause of diseases classified elsewhere: Secondary | ICD-10-CM | POA: Diagnosis not present

## 2022-05-25 DIAGNOSIS — I152 Hypertension secondary to endocrine disorders: Secondary | ICD-10-CM | POA: Diagnosis not present

## 2022-05-25 DIAGNOSIS — Z634 Disappearance and death of family member: Secondary | ICD-10-CM | POA: Diagnosis not present

## 2022-05-25 DIAGNOSIS — R051 Acute cough: Secondary | ICD-10-CM | POA: Diagnosis not present

## 2022-05-25 DIAGNOSIS — E785 Hyperlipidemia, unspecified: Secondary | ICD-10-CM | POA: Diagnosis not present

## 2022-06-04 ENCOUNTER — Ambulatory Visit (INDEPENDENT_AMBULATORY_CARE_PROVIDER_SITE_OTHER): Payer: Medicare Other | Admitting: Psychiatry

## 2022-06-04 ENCOUNTER — Encounter: Payer: Self-pay | Admitting: Psychiatry

## 2022-06-04 DIAGNOSIS — F4001 Agoraphobia with panic disorder: Secondary | ICD-10-CM | POA: Diagnosis not present

## 2022-06-04 DIAGNOSIS — F331 Major depressive disorder, recurrent, moderate: Secondary | ICD-10-CM | POA: Diagnosis not present

## 2022-06-04 DIAGNOSIS — F5105 Insomnia due to other mental disorder: Secondary | ICD-10-CM

## 2022-06-04 DIAGNOSIS — F411 Generalized anxiety disorder: Secondary | ICD-10-CM | POA: Diagnosis not present

## 2022-06-04 MED ORDER — ALPRAZOLAM 2 MG PO TABS: 2.0000 mg | ORAL_TABLET | Freq: Three times a day (TID) | ORAL | 5 refills | Status: DC

## 2022-06-04 NOTE — Progress Notes (Signed)
Victor [name] [id number] [birth year] 62 y.o.   Subjective:   Patient ID:  Victor [name] is a 62 y.o. (DOB November 30, 1960) male.  Chief Complaint:  Chief Complaint  Patient presents with   Follow-up   Depression   Anxiety   Sleeping Problem    Depression        Associated symptoms include fatigue.  Associated symptoms include no decreased concentration and no suicidal ideas.  Past medical history includes anxiety.   Anxiety Patient reports no confusion, decreased concentration, dizziness, nervous/anxious behavior or suicidal ideas.     [name] presents for follow-up of his psychiatric conditions today.  At visit Jul 04, 2018.  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  At  visit June 2020.  He has had problems with insomnia related to selegiline apparently.  We reduced the dose to 7.5 mg and gave it all in the morning.  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  He had benefit from selegiline 10 mg and then 7.5 mg but SE insomnia.  We also added low-dose quetiapine at night to help with sleep and potentially with depression as well.   seen August 2020.  He ended up using hydroxyzine instead of quetiapine for sleep.  He was satisfied with his meds overall.  Anxiety and depression were improved but not resolved on selegiline 7.5 mg every morning.  No meds were changed. Off and on taking selegiline 10 mg am about 4 days weekly and other days 7.5 mg am.  It has clearly helped the depression which is under control.  Xanax helps the anxiety.  seen November 2020.  The following med decisions were made: Increase hydroxyzine 50 mg to 2 HS for sleep. Continue selegiline 7.5 mg each morning DT SE insomnia.  He had worsening symptoms of depression and anxiety when he reduced the dose to 5 mg daily.   04/29/19 appt without med changes and following noted: B-in-law died at 17 at [facility] with Covid.  Pt having problems with low platelets.   Further workup ongoing.  Sometimess feels funny in afternoon with 7.5 mg selegiline and will intermittently reduce the dose.  Still CO trouble with sleep. Drowsy if still in the afternoon. Still issues with sleep and then gets mad he can't go to sleep.  3-4 hours of sleep total.  No caffeine after lunch.  No napping.  Says quetiapine didn't help sleep but is taking hydroxyzine 50 and asks to increase it. He dropped the selegiline to 7.5 mg but still had SE.  Then reduced to 5 mg and did OK for awhile but then gradually more depression with stressors.   Tolerated it fine this time.  It is helping.  Less anxious.  Has helped the depression until the other things occurred. Was doing fine until Covid.  Is high risk with workup for leukemia with low WBC.  That's working against him.  10/28/19 appt with the following noted: They expect me to die.  Severe nonalcoholic cirrhosis.  Requiring ascites drainage.  Severe blood abnormalities.  Not ready to die.  Doctor said to get his affairs in order.  Trying to get wife ready and it's very hard.  Hard to handle.  No timeline on lifespan.  Doesn't expect to be eligible for liver transplant.   Has had 27 surgeries.  Wonders about blood cancer too bc platelet count is so low.  Esophageal varices required surgery. Has to take it one day at a  time.  Wake at night thinking about it and having NM about it. No concerns about psych meds from medical perspective per the doctors.   Restrictive diet. Taking Xanax TID and feels like he needs another in the middle of the night.  It calms him way down but doesn't make him sleep.  Will have heart racing fear and the Xanax takes it down. Takes it 730, 12 noon and 6:30 and then needs another in middle of night. No energy due to illness. Plan: Increase alprazolam to 2 mg 4 times daily. Because hydroxyzine was not helpful he was encouraged to stop it.  04/26/20 appt noted: Don't go anywhere bc immune system.  Doing OK.   Says  quetiapine went from $30 to $120 for  3 mos supplytaking 200 mg HS.  Sleep is better and pretty good overall.  To bed 11 and awaken 3 a No SE. nd 530 and up 6 and getting 6 hours which is good. Helped more than anything else. There is better overall mood and anxiety wise than in a long time.  Satisfied with meds and does not want changes. Plan no med changes  10/13/20 appt noted: TKR 2 weeks ago.  OK so far except pain.  This has interfered with sleep but is getting better. Overall feels good for what he's been through and satisfied with each of med and tolerating well without excessive sedation. Pt reports that mood is Anxious and Depressed and both are improved with the selegiline. Extra Xanax made a big difference with anxiety.  No napping.   Anxiety symptoms include: Excessive Worry,.  Pt reports that appetite is decreased. Pt reports that energy is improved and improved. Concentration is improved. Suicidal thoughts:  denied by patient. . Suicidal thoughts:  denied by patient. Plan: cont selegiline 7.5 mg AM and Seroquel 200 mg HS  04/13/21 appt noted: Had to increase Seroquel to 400 mg HS and Selegiline 7.5 mg AM Sleep better with Seroquel 400 mg HS and taking Xanax.   Depression is managed "you can't get a whole lot better". Anxiety is manageable. No SE Uses CPAP  10/18/21 appt noted: Doing ok with meds. Dep and anxiety are controlled.  Sleep is ok usually . Seroquel to 400 mg HS and Selegiline 7.5 mg AM Still some knee problems after surgery.  06/04/22 appt noted: Son committed suicide last month with GSW..  son's wife is estranged from them making it worse.   Info is being kept from pt's family.  [facility] being held 4[facility]28 but pt and his wife not invited.  Pt thinks suicide related to marital problems. Pt's wife really upset and hard to take care of her right now.   Tolerating meds and not ovre sedated. Continues Xanax 2 mg QID, Seroquel 400 HS, selegiline 5 mg AM. Was fine until this  happens.  No increase in pain meds usually.       No caffeine.   Uses CPAP except lately because of having nose cancer surgery.  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 Gets tired and drowsy in the day without sleep.  No caffeine after 9 pm.    Past Psychiatric Medication Trials: Trazodone, Wellbutrin, mirtazapine, duloxetine, paroxetine, citalopram, Vivactil, selegiline (Emsam) for several years with good response until 2016  and stopped DT cost,   quetiapine 100, Xanax 2 mg at night then to 4 times daily, Sonata, Lunesta, Ambien, Rozerem, hydroxyzine hs NR  Adopted Son died suicide 05-07-22. (Raised him from boy beng 30 yo).  Son's bio father committed suicide.  Wife's got family hx suicide and   Review of Systems:  Review of Systems  Constitutional:  Positive for fatigue.  Musculoskeletal:  Positive for arthralgias, back pain, gait problem and joint swelling.  Neurological:  Negative for dizziness and tremors.  Psychiatric/Behavioral:  Negative for agitation, behavioral problems, confusion, decreased concentration, dysphoric mood, hallucinations, self-injury, sleep disturbance and suicidal ideas. The patient is not nervous/anxious and is not hyperactive.     Medications: I have reviewed the patient's current medications.  Current Outpatient Medications  Medication Sig Dispense Refill   atenolol (TENORMIN) 25 MG tablet Take 25 mg by mouth in the morning.     atorvastatin (LIPITOR) 40 MG tablet Take 40 mg by mouth daily before breakfast.      cyanocobalamin (VITAMIN B12) 1000 MCG tablet Take 1,000 mcg by mouth in the morning.     ferrous sulfate 325 (65 FE) MG tablet Take 325 mg by mouth in the morning and at bedtime. Morning & supper     furosemide (LASIX) 40 MG tablet Take 60 mg by mouth in the morning.     HYDROmorphone (DILAUDID) 4 MG tablet Take 8 mg by mouth 4 (four) times daily as needed (pain.).     lactulose (CHRONULAC) 10 GM/15ML solution Take 20 g by mouth daily as needed for severe  constipation. TAKE 30 MLS (20 G DOSE) BY MOUTH DAILY. TAKE DAILY. IF NEEDED, INCREASE DOSE TO HAVE 2-3 BOWEL MOVEMENTS A DAY AND TO AVOID CONFUSION/FOGGINESS.     latanoprost (XALATAN) 0.005 % ophthalmic solution Place 1 drop into both eyes at bedtime.     losartan (COZAAR) 50 MG tablet Take 50 mg by mouth in the morning.     NOVOLOG FLEXPEN 100 UNIT/ML FlexPen Inject 40 Units into the skin with breakfast, with lunch, and with evening meal.     pantoprazole (PROTONIX) 40 MG tablet Take 40 mg by mouth See admin instructions. Take 1 tablet (40 mg) by mouth scheduled every morning & may repeat a dose (40 mg) in the afternoon if needed for indigestion/heartburn.  0   QUEtiapine (SEROQUEL) 200 MG tablet TAKE 1 TO 2 TABLETS BY MOUTH AT BEDTIME AS NEEDED FOR SEVERE INSOMNIA (Patient taking differently: Take 400 mg by mouth at bedtime.) 180 tablet 0   selegiline (ELDEPRYL) 5 MG tablet TAKE 1 & 1/2 TABLETS BY MOUTH EVERY MORNING (Patient taking differently: Take 5 mg by mouth daily before breakfast.) 135 tablet 1   spironolactone (ALDACTONE) 100 MG tablet Take 200 mg by mouth in the morning.     TRESIBA FLEXTOUCH 200 UNIT/ML FlexTouch Pen Inject 80 Units into the skin in the morning and at bedtime.     triamcinolone cream (KENALOG) 0.1 % Apply 1 Application topically 3 (three) times daily.     vitamin E 400 UNIT capsule Take 400 Units by mouth in the morning.     alprazolam (XANAX) 2 MG tablet Take 1 tablet (2 mg total) by mouth in the morning, at noon, and at bedtime. 90 tablet 5   No current facility-administered medications for this visit.   Facility-Administered Medications Ordered in Other Visits  Medication Dose Route Frequency Provider Last Rate Last Admin   0.9 %  sodium chloride infusion   Intravenous Once Axel Filler, MD        Medication Side Effects: Insomnia  Allergies:  Allergies  Allergen Reactions   Canagliflozin Itching and Other (See Comments)    Yeast infections   pancreatitis Yeast  infections  pancreatitis Yeast infections   Cyclobenzaprine Anaphylaxis and Other (See Comments)    REACTION:  Not compatible with Emsam patch---not taking this patch any longer    Gabapentin Other (See Comments)    Other reaction(s): shortness of breath/tardive dyskinesia   Tanzeum [Albiglutide] Other (See Comments)    PANCREATITIS   Hydrocodone-Acetaminophen Hives    REACTION: pt turns red and has hot flashes    Iodinated Contrast Media Rash    1 EPISODE, RELIEVED WITH BENADRYL   Iohexol Hives    After CT a/p. Gave him Benadryl  PO for fewer than 10 hives on face/neck.  No swelling or airway issues. Larina Earthly, RN After CT a/p. Gave him Benadryl  PO for fewer than 10 hives on face/neck.  No swelling or airway issues. Larina Earthly, RN   Metformin And Related Diarrhea   Nsaids Diarrhea and Other (See Comments)    Other reaction(s): stomach upset Stomach Upset  Other reaction(s): stomach upset Stomach Upset Stomach Upset   Other Other (See Comments)   Prednisone Other (See Comments)    Allergic to all steroids- increase blood glucose   Vicodin [Hydrocodone-Acetaminophen] Hives and Other (See Comments)    Other reaction(s): hives/itching (although he is able to tolerate Percocet) REACTION: pt turns red and has hot flashes     Past Medical History:  Diagnosis Date   Anxiety    takes Xanax daily as needed   Blood dyscrasia    thrombocytopenia    Blood transfusion    platelets   C. difficile colitis 03/2015   Depression    Emsam patch daily   Diabetes mellitus     Type II    GERD (gastroesophageal reflux disease)    takes Protonix daily   Hepatitis    hepatitis b/ newly diagnosed with portal hypertension   Hepatitis B virus infection 03/2007   History of kidney stones    passed- x 2   History of shingles    Hyperlipidemia    takes AtorvaStatin daily   Hypertension    takes Atenolol and Lisinopril daily   Obstructive sleep apnea (adult)  (pediatric)    mild with AHI 11.61/hr now on CPAP at 15cm h2o, CPAP is used q night    Osteoarthritis    Pancreatitis    Sciatica    lumbar region - 2010, also has had numerous injections    Splenomegaly    LOV Dr Gaylyn Rong  12/12 on chart   Thrombocytopenia     Family History  Problem Relation Age of Onset   Cancer Mother     Social History   Socioeconomic History   Marital status: Married    Spouse name: Not on file   Number of children: Not on file   Years of education: Not on file   Highest education level: Not on file  Occupational History   Not on file  Tobacco Use   Smoking status: Former    Packs/day: 1.00    Years: 15.00    Additional pack years: 0.00    Total pack years: 15.00    Types: Cigarettes    Quit date: 12/31/1985    Years since quitting: 36.4   Smokeless tobacco: Never  Vaping Use   Vaping Use: Never used  Substance and Sexual Activity   Alcohol use: No   Drug use: No   Sexual activity: Not on file  Other Topics Concern   Not on file  Social History Narrative   Not on file  Social Determinants of Health   Financial Resource Strain: Not on file  Food Insecurity: Not on file  Transportation Needs: Not on file  Physical Activity: Not on file  Stress: Not on file  Social Connections: Not on file  Intimate Partner Violence: Not on file    Past Medical History, Surgical history, Social history, and Family history were reviewed and updated as appropriate.   Please see review of systems for further details on the patient's review from today.   Objective:   Physical Exam:  There were no vitals taken for this visit.  Physical Exam Constitutional:      General: He is not in acute distress. Musculoskeletal:        General: No deformity.  Neurological:     Mental Status: He is alert and oriented to person, place, and time.     Cranial Nerves: No dysarthria.     Coordination: Coordination normal.  Psychiatric:        Attention and  Perception: Attention and perception normal. He does not perceive auditory or visual hallucinations.        Mood and Affect: Mood is anxious and depressed. Affect is not labile, blunt, angry or inappropriate.        Speech: Speech normal.        Behavior: Behavior normal. Behavior is cooperative.        Thought Content: Thought content normal. Thought content is not paranoid or delusional. Thought content does not include homicidal or suicidal ideation. Thought content does not include suicidal plan.        Cognition and Memory: Cognition and memory normal.        Judgment: Judgment normal.     Comments: Insight intact More dysphoric over son's suicide March 2024     Lab Review:     Component Value Date/Time   NA 135 07/21/2019 1125   K 4.3 07/21/2019 1125   CL 103 07/21/2019 1125   CO2 26 07/21/2019 1125   GLUCOSE 195 (H) 07/21/2019 1125   BUN 11 07/21/2019 1125   CREATININE 0.85 07/21/2019 1125   CALCIUM 8.9 07/21/2019 1125   PROT 6.2 (L) 07/21/2019 1125   ALBUMIN 3.0 (L) 07/21/2019 1125   AST 27 07/21/2019 1125   ALT 29 07/21/2019 1125   ALKPHOS 84 07/21/2019 1125   BILITOT 1.1 07/21/2019 1125   GFRNONAA >60 07/21/2019 1125   GFRAA >60 07/21/2019 1125       Component Value Date/Time   WBC 3.2 (L) 07/21/2019 1125   RBC 4.28 07/21/2019 1125   HGB 12.6 (L) 07/21/2019 1125   HGB 13.7 01/22/2011 1348   HCT 37.9 (L) 07/21/2019 1125   HCT 40.1 01/22/2011 1348   PLT 41 (L) 07/21/2019 1125   PLT 59 (L) 01/22/2011 1348   MCV 88.6 07/21/2019 1125   MCV 87.9 01/22/2011 1348   MCH 29.4 07/21/2019 1125   MCHC 33.2 07/21/2019 1125   RDW 14.8 07/21/2019 1125   RDW 13.9 01/22/2011 1348   LYMPHSABS 1.2 04/22/2015 1423   LYMPHSABS 1.1 01/22/2011 1348   MONOABS 0.3 04/22/2015 1423   MONOABS 0.3 01/22/2011 1348   EOSABS 0.1 04/22/2015 1423   EOSABS 0.1 01/22/2011 1348   BASOSABS 0.0 04/22/2015 1423   BASOSABS 0.0 01/22/2011 1348    No results found for: "POCLITH", "LITHIUM"    No results found for: "PHENYTOIN", "PHENOBARB", "VALPROATE", "CBMZ"   .res Assessment: Plan:    Doyne was seen today for follow-up, depression, anxiety and sleeping problem.  Diagnoses and all orders for this visit:  Major depressive disorder, recurrent episode, moderate  Generalized anxiety disorder -     alprazolam (XANAX) 2 MG tablet; Take 1 tablet (2 mg total) by mouth in the morning, at noon, and at bedtime.  Insomnia due to mental condition -     alprazolam (XANAX) 2 MG tablet; Take 1 tablet (2 mg total) by mouth in the morning, at noon, and at bedtime.  Panic disorder with agoraphobia -     alprazolam (XANAX) 2 MG tablet; Take 1 tablet (2 mg total) by mouth in the morning, at noon, and at bedtime.   Greater than 50% of 30 min face to face time with patient was spent on counseling and coordination of care. We discussed Patient with a long history of recurrent major depression panic disorder generalized anxiety disorder and insomnia.  He had a good response to selegiline over a period of number of years but stopped it in 2017 due to expense of the Emsam patch.  .  He is failed multiple other SSRIs and SNRIs.  His depression has generally responded to oral selegiline.  His anxiety is improved with the increase in alprazolam to the maximum 2 mg 4 times daily.  He is tolerating it well.    Panic and anxiety symptoms are pretty well controlled at present.  We used the selegiline tablets in place of the patch as it is more affordable.  He started selegiline 5 mg half twice daily for 3 days then 1 twice dail.  We discussed side effects in detail including MAO inhibitors restrictions regarding medication interactions.  Unfortunately he had significant problems with insomnia which is a known side effect possibility and some tension.   However his depression and energy are improved and he's satisfied with meds. Insomnia was a problem and he's not sure if it's worse on selegiline 10 mg  selegiline vs 7.5 mg AM.    Insomnia resolved with quetiapine for ahwile but had to increase to 400 mg nightly.  We discussed the use of good Rx as a way to get around high cost of quetiapine with his insurance.  Continue alprazolam to 2 mg 4 times daily.He is aware that this is a high dose and of asked him not to take other sedatives at night.  We discussed the fall risk and that given his low platelet count if he has a fall he is at increased risk of complications of bleeding.  He understands this risk.  He states it is not overly sedating to him when he takes it during the day. Does not appear sedated.  Disc increased risk with opiates. No abuse evidence.  Continue selegiline 5 mg each morning DT SE insomnia.  He had worsening symptoms of depression and anxiety when he reduced the dose to 5 mg daily. Disc DDI with tramadol and avoid this.  Discussed potential metabolic side effects associated with atypical antipsychotics, as well as potential risk for movement side effects. Advised pt to contact office if movement side effects occur.   We discussed the short-term risks associated with benzodiazepines including sedation and increased fall risk among others.  Discussed long-term side effect risk including dependence, potential withdrawal symptoms, and the potential eventual dose-related risk of dementia.  Again discussed MAO inhibitor restrictions and especially drug interaction issues.  [facility] counseling for grief.    FU 6 months  Meredith Staggers, MD, DFAPA    Please see After Visit Summary for patient specific instructions.  Future Appointments  Date Time Provider Department Center  07/27/2022  9:30 AM Quintella Reichert, MD [id number]    No orders of the defined types were placed in this encounter.      -------------------------------

## 2022-06-14 DIAGNOSIS — G894 Chronic pain syndrome: Secondary | ICD-10-CM | POA: Diagnosis not present

## 2022-06-14 DIAGNOSIS — Z5181 Encounter for therapeutic drug level monitoring: Secondary | ICD-10-CM | POA: Diagnosis not present

## 2022-06-14 DIAGNOSIS — M545 Low back pain, unspecified: Secondary | ICD-10-CM | POA: Diagnosis not present

## 2022-06-14 DIAGNOSIS — E119 Type 2 diabetes mellitus without complications: Secondary | ICD-10-CM | POA: Diagnosis not present

## 2022-06-14 DIAGNOSIS — Z79899 Other long term (current) drug therapy: Secondary | ICD-10-CM | POA: Diagnosis not present

## 2022-06-14 DIAGNOSIS — E785 Hyperlipidemia, unspecified: Secondary | ICD-10-CM | POA: Diagnosis not present

## 2022-06-15 DIAGNOSIS — R188 Other ascites: Secondary | ICD-10-CM | POA: Diagnosis not present

## 2022-06-15 DIAGNOSIS — K7460 Unspecified cirrhosis of liver: Secondary | ICD-10-CM | POA: Diagnosis not present

## 2022-06-15 DIAGNOSIS — K7581 Nonalcoholic steatohepatitis (NASH): Secondary | ICD-10-CM | POA: Diagnosis not present

## 2022-06-22 ENCOUNTER — Telehealth: Payer: Self-pay | Admitting: *Deleted

## 2022-06-22 ENCOUNTER — Ambulatory Visit: Payer: Medicare Other | Attending: Cardiology | Admitting: Cardiology

## 2022-06-22 ENCOUNTER — Encounter: Payer: Self-pay | Admitting: Cardiology

## 2022-06-22 VITALS — BP 110/66 | HR 84 | Ht 72.0 in | Wt 345.0 lb

## 2022-06-22 DIAGNOSIS — I10 Essential (primary) hypertension: Secondary | ICD-10-CM

## 2022-06-22 DIAGNOSIS — N401 Enlarged prostate with lower urinary tract symptoms: Secondary | ICD-10-CM | POA: Diagnosis not present

## 2022-06-22 DIAGNOSIS — G4733 Obstructive sleep apnea (adult) (pediatric): Secondary | ICD-10-CM

## 2022-06-22 DIAGNOSIS — D61818 Other pancytopenia: Secondary | ICD-10-CM | POA: Diagnosis not present

## 2022-06-22 DIAGNOSIS — R0609 Other forms of dyspnea: Secondary | ICD-10-CM | POA: Diagnosis not present

## 2022-06-22 DIAGNOSIS — I2510 Atherosclerotic heart disease of native coronary artery without angina pectoris: Secondary | ICD-10-CM

## 2022-06-22 DIAGNOSIS — K7460 Unspecified cirrhosis of liver: Secondary | ICD-10-CM | POA: Diagnosis not present

## 2022-06-22 DIAGNOSIS — Z01810 Encounter for preprocedural cardiovascular examination: Secondary | ICD-10-CM | POA: Diagnosis not present

## 2022-06-22 DIAGNOSIS — Z01812 Encounter for preprocedural laboratory examination: Secondary | ICD-10-CM | POA: Insufficient documentation

## 2022-06-22 DIAGNOSIS — R339 Retention of urine, unspecified: Secondary | ICD-10-CM | POA: Diagnosis not present

## 2022-06-22 DIAGNOSIS — E611 Iron deficiency: Secondary | ICD-10-CM | POA: Diagnosis not present

## 2022-06-22 DIAGNOSIS — E538 Deficiency of other specified B group vitamins: Secondary | ICD-10-CM | POA: Diagnosis not present

## 2022-06-22 DIAGNOSIS — R0600 Dyspnea, unspecified: Secondary | ICD-10-CM | POA: Insufficient documentation

## 2022-06-22 DIAGNOSIS — N138 Other obstructive and reflux uropathy: Secondary | ICD-10-CM | POA: Diagnosis not present

## 2022-06-22 DIAGNOSIS — R079 Chest pain, unspecified: Secondary | ICD-10-CM | POA: Insufficient documentation

## 2022-06-22 LAB — BASIC METABOLIC PANEL
BUN/Creatinine Ratio: 14 (ref 10–24)
BUN: 16 mg/dL (ref 8–27)
CO2: 23 mmol/L (ref 20–29)
Calcium: 8.8 mg/dL (ref 8.6–10.2)
Chloride: 101 mmol/L (ref 96–106)
Creatinine, Ser: 1.17 mg/dL (ref 0.76–1.27)
Glucose: 157 mg/dL — ABNORMAL HIGH (ref 70–99)
Potassium: 3.8 mmol/L (ref 3.5–5.2)
Sodium: 136 mmol/L (ref 134–144)
eGFR: 71 mL/min/{1.73_m2} (ref >59)

## 2022-06-22 MED ORDER — PREDNISONE 50 MG PO TABS
ORAL_TABLET | ORAL | 0 refills | Status: DC
Start: 2022-06-22 — End: 2022-12-18

## 2022-06-22 MED ORDER — METOPROLOL TARTRATE 100 MG PO TABS: 100.0000 mg | ORAL_TABLET | Freq: Once | ORAL | 0 refills | Status: AC

## 2022-06-22 MED ORDER — DIPHENHYDRAMINE HCL 50 MG PO CAPS
ORAL_CAPSULE | ORAL | 0 refills | Status: DC
Start: 2022-06-22 — End: 2023-03-06

## 2022-06-22 NOTE — Progress Notes (Signed)
Date:  06/22/2022   ID:  Victor [name], DOB 16-Dec-1960, MRN [id number]  PCP:  Tally Joe, MD  Sleep Medicine:  Armanda Magic, MD Electrophysiologist:  None   Chief Complaint:  OSA  History of Present Illness:    Victor [name] is a 63 y.o. male with a hx of OSA, liver cirrhosis with ascites (being worked up for liver transplant), obesity and HTN .  He has a history of mild OSA with an AHI of 11.61/hr and now on CPAP at 15cm H2O.    He is doing well with his PAP device and thinks that he has gotten used to it.  He tolerates the mask and feels the pressure is adequate.  Since going on PAP he feels rested in the am and has no significant daytime sleepiness.  He denies any significant mouth or nasal dryness or nasal congestion.  He does not think that he snores.  His machine has been spitting out water into his face mask despite turning the humidity down. His device is 60 years old.  He tells me that he has been having problems with DOE to the point that he can only walk 18feet before having to stop.  He denies any chest pain or pressure. He has chronic LE edema that he thinks is stable.  He denies any palpitations or syncope.  He will get dizzy if he stands up too fast. He used to smoke but quit 35 years ago.     Prior CV studies:   The following studies were reviewed today:  PAP compliance download from [facility]  Past Medical History:  Diagnosis Date   Anxiety    takes Xanax daily as needed   Blood dyscrasia    thrombocytopenia    Blood transfusion    platelets   C. difficile colitis 03/2015   Depression    Emsam patch daily   Diabetes mellitus     Type II    GERD (gastroesophageal reflux disease)    takes Protonix daily   Hepatitis    hepatitis b/ newly diagnosed with portal hypertension   Hepatitis B virus infection 03/2007   History of kidney stones    passed- x 2   History of shingles    Hyperlipidemia    takes AtorvaStatin daily   Hypertension    takes Atenolol and  Lisinopril daily   Obstructive sleep apnea (adult) (pediatric)    mild with AHI 11.61/hr now on CPAP at 15cm h2o, CPAP is used q night    Osteoarthritis    Pancreatitis    Sciatica    lumbar region - 2010, also has had numerous injections    Splenomegaly    LOV Dr Gaylyn Rong  12/12 on chart   Thrombocytopenia [facility])    Past Surgical History:  Procedure Laterality Date   BACK SURGERY     X9   CHOLECYSTECTOMY N/A 04/26/2015   Procedure: LAPAROSCOPIC CHOLECYSTECTOMY;  Surgeon: Axel Filler, MD;  Location: [facility] OR;  Service: General;  Laterality: N/A;   COLONOSCOPY W/ POLYPECTOMY     HAND SURGERY Right    right hand x 2- injured   KNEE SURGERY     5 surgeries to right knee, 3 surgeries to left knee   KYPHOPLASTY     LUMBAR LAMINECTOMY/DECOMPRESSION MICRODISCECTOMY  02/19/2011   Procedure: LUMBAR LAMINECTOMY/DECOMPRESSION MICRODISCECTOMY;  Surgeon: Javier Docker;  Location: [facility] ORS;  Service: Orthopedics;  Laterality: Left;  decompression l5-s1 l4-5 on left   SHOULDER ARTHROSCOPY WITH SUBACROMIAL  DECOMPRESSION Right 11/30/2016   Procedure: Right shoulder arthroscopy, extensive debridement and subacromial decompression;  Surgeon: Yolonda Kida, MD;  Location: [facility] OR;  Service: Orthopedics;  Laterality: Right;  80   SHOULDER SURGERY Left    left shoulder, Rotar Cuff   SPINAL CORD STIMULATOR BATTERY EXCHANGE N/A 03/14/2016   Procedure: Revision of spinal cord stimulator battery;  Surgeon: Venita Lick, MD;  Location: [facility] OR;  Service: Orthopedics;  Laterality: N/A;  Requests 1 hour   SPINAL CORD STIMULATOR INSERTION N/A 02/02/2015   Procedure: LUMBAR SPINAL CORD STIMULATOR INSERTION;  Surgeon: Venita Lick, MD;  Location: [facility] OR;  Service: Orthopedics;  Laterality: N/A;     No outpatient medications have been marked as taking for the 06/22/22 encounter (Office Visit) with Quintella Reichert, MD.     Allergies:   Canagliflozin, Cyclobenzaprine, Gabapentin, Tanzeum [albiglutide],  Hydrocodone-acetaminophen, Iodinated contrast media, Iohexol, Metformin and related, Nsaids, Other, Prednisone, and Vicodin [hydrocodone-acetaminophen]   Social History   Tobacco Use   Smoking status: Former    Packs/day: 1.00    Years: 15.00    Additional pack years: 0.00    Total pack years: 15.00    Types: Cigarettes    Quit date: 12/31/1985    Years since quitting: 36.4   Smokeless tobacco: Never  Vaping Use   Vaping Use: Never used  Substance Use Topics   Alcohol use: No   Drug use: No     Family Hx: The patient's family history includes Cancer in his mother.  ROS:   Please see the history of present illness.     All other systems reviewed and are negative.   Labs/Other Tests and Data Reviewed:    Recent Labs: No results found for requested labs within last 365 days.   Recent Lipid Panel No results found for: "CHOL", "TRIG", "HDL", "CHOLHDL", "LDLCALC", "LDLDIRECT"  Wt Readings from Last 3 Encounters:  04/12/21 (!) 307 lb (139.3 kg)  04/13/20 297 lb 6.4 oz (134.9 kg)  07/23/19 278 lb (126.1 kg)     Objective:    Vital Signs:  There were no vitals taken for this visit.   GEN: Well nourished, well developed in no acute distress HEENT: Normal NECK: No JVD; No carotid bruits LYMPHATICS: No lymphadenopathy CARDIAC:RRR, no murmurs, rubs, gallops RESPIRATORY:  Clear to auscultation without rales, wheezing or rhonchi  ABDOMEN: Soft, non-tender, non-distended MUSCULOSKELETAL:  No edema; No deformity  SKIN: Warm and dry NEUROLOGIC:  Alert and oriented x 3 PSYCHIATRIC:  Normal affect  EKG was performed today and demonstrate NSR with PACs, low voltage QRS, anterior infarct age undetermined  ASSESSMENT & PLAN:    1.  OSA - The patient is tolerating PAP therapy well without any problems. The PAP download performed by his DME was personally reviewed and interpreted by me today and showed an AHI of 0.8 /hr on 7 cm H2O with 97% compliance in using more than 4 hours  nightly.  The patient has been using and benefiting from PAP use and will continue to benefit from therapy.  -will order a new ResMed CPAP at 7cm H2O with heated humidity and mask of choice since his device is not controlling humidity anymore and is > 71 years old.    2.  HTN -BP is good on exam today -Continue prescription drug managed with atenolol 25 mg daily, losartan 50 mg daily, spironolactone 200 mg daily with as needed refills -I have personally reviewed and interpreted outside labs performed by patient's PCP which  showed serum creatinine 1.36 and potassium 4.5 on 07/03/2021 -Repeat be met today  3.  SOB -unclear etiology but limited to only being able to walk 50 feet before having to stop>>he has had problems with significant abdominal swelling with hx of recent paracetesis from 2 - 6L removal at a time>>suspect SOB may be due to increased pressure from ascites -he has chronic anemia/thrombocytopenia and neutropenia related to liver cirrhosis (labs 04/2022 [facility]) -VQ scan at [facility] 04/29/22 showed no PE -stress echo at [facility] 12/2021 for liver Tx workup was nondx due to inadequate HR response -2D echo 12/2021 at [facility] showed normal LVF with miold LVH, normal diastolic function, trivial TR and hyperdynamic LVF with intracavitary gradient of at rest -repeat 2D echo to make sure LVF has not declined -coronary CTA to define coronary anatomy   Medication Adjustments/Labs and Tests Ordered: Current medicines are reviewed at length with the patient today.  Concerns regarding medicines are outlined above.  Tests Ordered: No orders of the defined types were placed in this encounter.  Medication Changes: No orders of the defined types were placed in this encounter.   Disposition:  Follow up in 1 year(s)  Signed, Armanda Magic, MD  06/22/2022 9:57 AM    [facility]

## 2022-06-22 NOTE — Patient Instructions (Addendum)
Medication Instructions:  Your physician recommends that you continue on your current medications as directed. Please refer to the Current Medication list given to you today.  *If you need a refill on your cardiac medications before your next appointment, please call your pharmacy*   Lab Work: Please complete a BMET in our lab before you leave today.  If you have labs (blood work) drawn today and your tests are completely normal, you will receive your results only by: MyChart Message (if you have MyChart) OR A paper copy in the mail If you have any lab test that is abnormal or we need to change your treatment, we will call you to review the results.   Testing/Procedures: Your physician has requested that you have an echocardiogram. Echocardiography is a painless test that uses sound waves to create images of your heart. It provides your doctor with information about the size and shape of your heart and how well your heart's chambers and valves are working. This procedure takes approximately one hour. There are no restrictions for this procedure. Please do NOT wear cologne, perfume, aftershave, or lotions (deodorant is allowed). Please arrive 15 minutes prior to your appointment time.    Your cardiac CT will be scheduled at:   [facility] [address] [place], [place] [address] [phone]    Please arrive at the [facility] (Entrance C2) of [facility] 30 minutes prior to test start time. You can use the FREE valet parking offered at entrance C (encouraged to control the heart rate for the test)  Proceed to the [facility] Radiology Department (first floor) to check-in and test prep.  All radiology patients and guests should use entrance C2 at [facility], accessed from [facility], even though the hospital's physical address listed is [address].     Please follow these instructions carefully (unless  otherwise directed):  Hold all erectile dysfunction medications at least 3 days (72 hrs) prior to test. (Ie viagra, cialis, sildenafil, tadalafil, etc) We will administer nitroglycerin during this exam.   On the Night Before the Test: Be sure to Drink plenty of water. Do not consume any caffeinated/decaffeinated beverages or chocolate 12 hours prior to your test. Do not take any antihistamines 12 hours prior to your test. If the patient has contrast allergy: Patient will need a prescription for Prednisone and very clear instructions (as follows): Prednisone 50 mg - take 13 hours prior to test Take another Prednisone 50 mg 7 hours prior to test Take another Prednisone 50 mg 1 hour prior to test Take Benadryl 50 mg 1 hour prior to test Patient must complete all four doses of above prophylactic medications. Patient will need a ride after test due to Benadryl.  On the Day of the Test: Drink plenty of water until 1 hour prior to the test. Do not eat any food 1 hour prior to test. You may take your regular medications prior to the test.  Take metoprolol (Lopressor 100 mg) two hours prior to test. If you take Furosemide/Hydrochlorothiazide/Spironolactone, please HOLD on the morning of the test.          After the Test: Drink plenty of water. After receiving IV contrast, you may experience a mild flushed feeling. This is normal. On occasion, you may experience a mild rash up to 24 hours after the test. This is not dangerous. If this occurs, you can take Benadryl 25 mg and increase your fluid intake.  If you experience trouble breathing, this can be serious. If it is severe call 911 IMMEDIATELY. If it is mild, please call our office. If you take any of these medications: Glipizide/Metformin, Avandament, Glucavance, please do not take 48 hours after completing test unless otherwise instructed.  We will call to schedule your test 2-4 weeks out understanding that some insurance companies will  need an authorization prior to the service being performed.   For non-scheduling related questions, please contact the cardiac imaging nurse navigator should you have any questions/concerns: Rockwell Alexandria, Cardiac Imaging Nurse Navigator Larey Brick, Cardiac Imaging Nurse Navigator [facility] Direct Office Dial: [phone]   For scheduling needs, including cancellations and rescheduling, please call [place], [phone].    Follow-Up: At [facility], you and your health needs are our priority.  As part of our continuing mission to provide you with exceptional heart care, we have created designated Provider Care Teams.  These Care Teams include your primary Cardiologist (physician) and Advanced Practice Providers (APPs -  Physician Assistants and Nurse Practitioners) who all work together to provide you with the care you need, when you need it.  We recommend signing up for the patient portal called "MyChart".  Sign up information is provided on this After Visit Summary.  MyChart is used to connect with patients for Virtual Visits (Telemedicine).  Patients are able to view lab/test results, encounter notes, upcoming appointments, etc.  Non-urgent messages can be sent to your provider as well.   To learn more about what you can do with MyChart, go to [email or web address].    Your next appointment:   1 year(s)  Provider:   Dr. Armanda Magic, MD   Other Instructions Dr. Mayford Knife has ordered new cpap equipment for you. Someone from our sleep team will call you to set this up.

## 2022-06-22 NOTE — Addendum Note (Signed)
Addended by: Luellen Pucker on: 06/22/2022 10:37 AM   Modules accepted: Orders

## 2022-06-22 NOTE — Telephone Encounter (Signed)
Per Dr Mayford Knife, order a new ResMed CPAP at 7cm H2O with heated humidity and mask of choice    Upon patient request DME selection is [facility]. Patient understands he will be contacted by [facility] to set up his cpap. Patient understands to call if [facility] does not contact him with new setup in a timely manner. Patient understands they will be called once confirmation has been received from [facility]/ that they have received their new machine to schedule 10 week follow up appointment.   [facility] notified of new cpap order  Please add to [facility] Patient was grateful for the call and thanked me.

## 2022-06-25 ENCOUNTER — Telehealth: Payer: Self-pay

## 2022-06-25 NOTE — Telephone Encounter (Signed)
-----   Message from Quintella Reichert, MD sent at 06/24/2022  9:42 AM EDT ----- Please let patient know that labs were normal.  Continue current medical therapy.

## 2022-06-25 NOTE — Telephone Encounter (Signed)
Reviewed normal labs with patient who verbalized understanding to continue current medication regimen.

## 2022-07-13 ENCOUNTER — Telehealth (HOSPITAL_COMMUNITY): Payer: Self-pay | Admitting: Emergency Medicine

## 2022-07-13 NOTE — Telephone Encounter (Signed)
Reaching out to patient to offer assistance regarding upcoming cardiac imaging study; pt verbalizes understanding of appt date/time, parking situation and where to check in, pre-test NPO status and medications ordered, and verified current allergies; name and call back number provided for further questions should they arise Rockwell Alexandria RN Navigator Cardiac Imaging [facility] [phone] office [phone] cell  13hr prep + metoprolol

## 2022-07-16 ENCOUNTER — Encounter: Payer: Self-pay | Admitting: Cardiology

## 2022-07-16 ENCOUNTER — Other Ambulatory Visit: Payer: Self-pay | Admitting: Internal Medicine

## 2022-07-16 ENCOUNTER — Ambulatory Visit (HOSPITAL_BASED_OUTPATIENT_CLINIC_OR_DEPARTMENT_OTHER)
Admission: RE | Admit: 2022-07-16 | Discharge: 2022-07-16 | Disposition: A | Discharge status: Home / Self Care | Payer: Medicare Other | Source: Ambulatory Visit | Attending: Internal Medicine | Admitting: Internal Medicine

## 2022-07-16 ENCOUNTER — Ambulatory Visit (HOSPITAL_COMMUNITY)
Admission: RE | Admit: 2022-07-16 | Discharge: 2022-07-16 | Disposition: A | Discharge status: Home / Self Care | Payer: Medicare Other | Source: Ambulatory Visit | Attending: Cardiology | Admitting: Cardiology

## 2022-07-16 DIAGNOSIS — Z01810 Encounter for preprocedural cardiovascular examination: Secondary | ICD-10-CM

## 2022-07-16 DIAGNOSIS — R931 Abnormal findings on diagnostic imaging of heart and coronary circulation: Secondary | ICD-10-CM | POA: Insufficient documentation

## 2022-07-16 DIAGNOSIS — I2510 Atherosclerotic heart disease of native coronary artery without angina pectoris: Secondary | ICD-10-CM | POA: Diagnosis not present

## 2022-07-16 DIAGNOSIS — I700 Atherosclerosis of aorta: Secondary | ICD-10-CM | POA: Diagnosis not present

## 2022-07-16 DIAGNOSIS — R0600 Dyspnea, unspecified: Secondary | ICD-10-CM | POA: Insufficient documentation

## 2022-07-16 MED ORDER — NITROGLYCERIN 0.4 MG SL SUBL
SUBLINGUAL_TABLET | SUBLINGUAL | Status: AC
  Filled 2022-07-16: qty 2

## 2022-07-16 MED ORDER — IOHEXOL 350 MG/ML SOLN
100.0000 mL | Freq: Once | INTRAVENOUS | Status: AC | PRN
  Administered 2022-07-16: 100 mL via INTRAVENOUS

## 2022-07-16 MED ORDER — NITROGLYCERIN 0.4 MG SL SUBL
0.8000 mg | SUBLINGUAL_TABLET | Freq: Once | SUBLINGUAL | Status: AC
  Administered 2022-07-16: 0.8 mg via SUBLINGUAL

## 2022-07-17 ENCOUNTER — Telehealth: Payer: Self-pay | Admitting: Cardiology

## 2022-07-17 DIAGNOSIS — G4733 Obstructive sleep apnea (adult) (pediatric): Secondary | ICD-10-CM

## 2022-07-17 DIAGNOSIS — I2510 Atherosclerotic heart disease of native coronary artery without angina pectoris: Secondary | ICD-10-CM

## 2022-07-17 DIAGNOSIS — I10 Essential (primary) hypertension: Secondary | ICD-10-CM

## 2022-07-17 DIAGNOSIS — R931 Abnormal findings on diagnostic imaging of heart and coronary circulation: Secondary | ICD-10-CM

## 2022-07-17 MED ORDER — ATORVASTATIN CALCIUM 80 MG PO TABS: 80.0000 mg | ORAL_TABLET | Freq: Every day | ORAL | 3 refills | Status: DC

## 2022-07-17 MED ORDER — ASPIRIN 81 MG PO TBEC: 81.0000 mg | DELAYED_RELEASE_TABLET | Freq: Every day | ORAL | 3 refills | Status: DC

## 2022-07-17 NOTE — Telephone Encounter (Signed)
Victor Course, MD 07/16/2022  5:14 PM EDT     Last BP at hematology was very soft in the low 100's systolic so would avoid addition of Imdur. Continue low dose BB. Have patient come in for BNP due to SOB   Quintella Reichert, MD 07/16/2022  5:11 PM EDT     Coronary CTA demonstrated coronary calcium score of 844 with 50-69% pRCA, 25-49% dLM, 50-69% oLAD, 70-99% small oD1, 25-495 pLCx with FFR demonstrating no significant flow limiting lesions in the main epicardial vessels but there appears to be flow limitation in the diagonal which is very small so recommend aggressive medical therapy including LDL goal < 55.  Start ASA 81mg  daily and increase Atorvastatin to 80mg  daily and repeat FLP and ALT In 6 weeks   The patient has been notified of the result and verbalized understanding.  All questions (if any) were answered. Frutoso Schatz, RN 07/17/2022 5:01 PM

## 2022-07-17 NOTE — Telephone Encounter (Signed)
Patient called to follow-up on his CT CARDIAC POST PROCESSING test.

## 2022-07-18 DIAGNOSIS — R0602 Shortness of breath: Secondary | ICD-10-CM | POA: Diagnosis not present

## 2022-07-18 DIAGNOSIS — I2510 Atherosclerotic heart disease of native coronary artery without angina pectoris: Secondary | ICD-10-CM | POA: Diagnosis not present

## 2022-07-23 DIAGNOSIS — K7460 Unspecified cirrhosis of liver: Secondary | ICD-10-CM | POA: Diagnosis not present

## 2022-07-23 DIAGNOSIS — R188 Other ascites: Secondary | ICD-10-CM | POA: Diagnosis not present

## 2022-07-23 DIAGNOSIS — K7581 Nonalcoholic steatohepatitis (NASH): Secondary | ICD-10-CM | POA: Diagnosis not present

## 2022-07-26 ENCOUNTER — Ambulatory Visit: Payer: Medicare Other

## 2022-07-26 ENCOUNTER — Ambulatory Visit (HOSPITAL_COMMUNITY): Payer: Medicare Other | Attending: Cardiology

## 2022-07-26 DIAGNOSIS — R0600 Dyspnea, unspecified: Secondary | ICD-10-CM | POA: Diagnosis not present

## 2022-07-26 DIAGNOSIS — R0609 Other forms of dyspnea: Secondary | ICD-10-CM

## 2022-07-26 MED ORDER — PERFLUTREN LIPID MICROSPHERE
1.0000 mL | INTRAVENOUS | Status: AC | PRN
Start: 2022-07-26 — End: 2022-07-26
  Administered 2022-07-26: 2 mL via INTRAVENOUS

## 2022-07-27 ENCOUNTER — Ambulatory Visit: Payer: Medicare Other | Admitting: Cardiology

## 2022-07-28 LAB — ECHOCARDIOGRAM COMPLETE
Area-P 1/2: 3.63 cm2
S' Lateral: 2.3 cm

## 2022-07-30 ENCOUNTER — Telehealth: Payer: Self-pay

## 2022-07-30 ENCOUNTER — Telehealth: Payer: Self-pay | Admitting: Cardiology

## 2022-07-30 DIAGNOSIS — R0600 Dyspnea, unspecified: Secondary | ICD-10-CM

## 2022-07-30 DIAGNOSIS — K7460 Unspecified cirrhosis of liver: Secondary | ICD-10-CM

## 2022-07-30 DIAGNOSIS — R931 Abnormal findings on diagnostic imaging of heart and coronary circulation: Secondary | ICD-10-CM

## 2022-07-30 NOTE — Telephone Encounter (Signed)
Pt calling regarding test results. Pt would like a callback. Please advise.

## 2022-07-30 NOTE — Telephone Encounter (Signed)
Urgent referral placed to [facility] as requested. Patient still been unable to reach with results. Forwarded to PCP as well.

## 2022-07-30 NOTE — Telephone Encounter (Signed)
Patient aware of echo results. He needs Echo bubble scheduled per Dr. Mayford Knife

## 2022-07-30 NOTE — Telephone Encounter (Signed)
-----   Message from Quintella Reichert, MD sent at 07/23/2022  9:46 AM EDT ----- Noncardiac portion of coronary CTA showed cirrhosis with fatty liver and large ascites which is fluid in the abdomen from cirrhosis of the liver. Please get him in with [facility] ASAP and forward results to his PCP

## 2022-08-08 DIAGNOSIS — R188 Other ascites: Secondary | ICD-10-CM | POA: Diagnosis not present

## 2022-08-08 DIAGNOSIS — K7460 Unspecified cirrhosis of liver: Secondary | ICD-10-CM | POA: Diagnosis not present

## 2022-08-22 ENCOUNTER — Telehealth: Payer: Self-pay | Admitting: Cardiology

## 2022-08-22 NOTE — Telephone Encounter (Signed)
Office calling stating that they are not accepting any transfer gastro patients that have been there before. Or have seen another gastro anywhere else. Please advise.

## 2022-08-22 NOTE — Telephone Encounter (Signed)
Pt sees Mosetta Putt MD with [facility] and he saw her on 08/08/22 with labs. Will forward the Cardiac CT report to her but it appears she has addressed it with the pt at his OV.

## 2022-08-27 ENCOUNTER — Ambulatory Visit (HOSPITAL_COMMUNITY): Payer: Medicare Other | Attending: Cardiology

## 2022-08-27 DIAGNOSIS — R0602 Shortness of breath: Secondary | ICD-10-CM | POA: Diagnosis not present

## 2022-08-27 DIAGNOSIS — R0600 Dyspnea, unspecified: Secondary | ICD-10-CM | POA: Insufficient documentation

## 2022-08-27 LAB — ECHOCARDIOGRAM LIMITED BUBBLE STUDY

## 2022-08-28 ENCOUNTER — Telehealth: Payer: Self-pay | Admitting: Cardiology

## 2022-08-28 NOTE — Telephone Encounter (Signed)
Error

## 2022-08-28 NOTE — Telephone Encounter (Signed)
Pt returning call for echo results  

## 2022-08-28 NOTE — Telephone Encounter (Signed)
Attempted to call the pt but line busy x2. Will try again later.

## 2022-08-29 DIAGNOSIS — K7460 Unspecified cirrhosis of liver: Secondary | ICD-10-CM | POA: Diagnosis not present

## 2022-08-29 DIAGNOSIS — R188 Other ascites: Secondary | ICD-10-CM | POA: Diagnosis not present

## 2022-08-29 DIAGNOSIS — K7581 Nonalcoholic steatohepatitis (NASH): Secondary | ICD-10-CM | POA: Diagnosis not present

## 2022-08-29 NOTE — Telephone Encounter (Signed)
Reviewed Echo results with patient, who verbalizes understanding that EF is normal and there is no evidence for ASD or PFO.

## 2022-08-29 NOTE — Telephone Encounter (Signed)
Pt spouse returning call, she states pt has another appt at 40. She states if you do not call before then, please call around 2-3pm if possible.

## 2022-08-31 DIAGNOSIS — K7581 Nonalcoholic steatohepatitis (NASH): Secondary | ICD-10-CM | POA: Diagnosis not present

## 2022-08-31 DIAGNOSIS — R188 Other ascites: Secondary | ICD-10-CM | POA: Diagnosis not present

## 2022-08-31 DIAGNOSIS — K7460 Unspecified cirrhosis of liver: Secondary | ICD-10-CM | POA: Diagnosis not present

## 2022-09-05 ENCOUNTER — Ambulatory Visit: Payer: Medicare Other

## 2022-09-07 ENCOUNTER — Other Ambulatory Visit: Payer: Self-pay | Admitting: Family Medicine

## 2022-09-07 ENCOUNTER — Ambulatory Visit: Admission: RE | Admit: 2022-09-07 | Payer: Medicare Other | Source: Ambulatory Visit

## 2022-09-07 DIAGNOSIS — R053 Chronic cough: Secondary | ICD-10-CM | POA: Diagnosis not present

## 2022-09-07 DIAGNOSIS — R059 Cough, unspecified: Secondary | ICD-10-CM | POA: Diagnosis not present

## 2022-09-07 DIAGNOSIS — E1165 Type 2 diabetes mellitus with hyperglycemia: Secondary | ICD-10-CM | POA: Diagnosis not present

## 2022-09-09 ENCOUNTER — Other Ambulatory Visit: Payer: Self-pay | Admitting: Psychiatry

## 2022-09-09 DIAGNOSIS — F331 Major depressive disorder, recurrent, moderate: Secondary | ICD-10-CM

## 2022-09-09 NOTE — Telephone Encounter (Signed)
Verify that he is taking 5 mg versus 7.5

## 2022-09-10 DIAGNOSIS — K7682 Hepatic encephalopathy: Secondary | ICD-10-CM | POA: Diagnosis not present

## 2022-09-10 DIAGNOSIS — K7581 Nonalcoholic steatohepatitis (NASH): Secondary | ICD-10-CM | POA: Diagnosis not present

## 2022-09-10 DIAGNOSIS — K7460 Unspecified cirrhosis of liver: Secondary | ICD-10-CM | POA: Diagnosis not present

## 2022-09-10 DIAGNOSIS — D696 Thrombocytopenia, unspecified: Secondary | ICD-10-CM | POA: Diagnosis not present

## 2022-09-10 DIAGNOSIS — R188 Other ascites: Secondary | ICD-10-CM | POA: Diagnosis not present

## 2022-09-12 ENCOUNTER — Other Ambulatory Visit: Payer: Self-pay | Admitting: Psychiatry

## 2022-09-12 DIAGNOSIS — F5105 Insomnia due to other mental disorder: Secondary | ICD-10-CM

## 2022-09-13 DIAGNOSIS — R188 Other ascites: Secondary | ICD-10-CM | POA: Diagnosis not present

## 2022-09-13 DIAGNOSIS — K7460 Unspecified cirrhosis of liver: Secondary | ICD-10-CM | POA: Diagnosis not present

## 2022-09-13 DIAGNOSIS — K7581 Nonalcoholic steatohepatitis (NASH): Secondary | ICD-10-CM | POA: Diagnosis not present

## 2022-09-20 DIAGNOSIS — I8510 Secondary esophageal varices without bleeding: Secondary | ICD-10-CM | POA: Diagnosis not present

## 2022-09-20 DIAGNOSIS — D696 Thrombocytopenia, unspecified: Secondary | ICD-10-CM | POA: Diagnosis not present

## 2022-09-20 DIAGNOSIS — R188 Other ascites: Secondary | ICD-10-CM | POA: Diagnosis not present

## 2022-09-20 DIAGNOSIS — G4700 Insomnia, unspecified: Secondary | ICD-10-CM | POA: Diagnosis not present

## 2022-09-20 DIAGNOSIS — K7460 Unspecified cirrhosis of liver: Secondary | ICD-10-CM | POA: Diagnosis not present

## 2022-09-20 DIAGNOSIS — K625 Hemorrhage of anus and rectum: Secondary | ICD-10-CM | POA: Diagnosis not present

## 2022-09-24 DIAGNOSIS — D1801 Hemangioma of skin and subcutaneous tissue: Secondary | ICD-10-CM | POA: Diagnosis not present

## 2022-09-24 DIAGNOSIS — L308 Other specified dermatitis: Secondary | ICD-10-CM | POA: Diagnosis not present

## 2022-09-24 DIAGNOSIS — L814 Other melanin hyperpigmentation: Secondary | ICD-10-CM | POA: Diagnosis not present

## 2022-09-24 DIAGNOSIS — Z85828 Personal history of other malignant neoplasm of skin: Secondary | ICD-10-CM | POA: Diagnosis not present

## 2022-10-03 DIAGNOSIS — R188 Other ascites: Secondary | ICD-10-CM | POA: Diagnosis not present

## 2022-10-03 DIAGNOSIS — D696 Thrombocytopenia, unspecified: Secondary | ICD-10-CM | POA: Diagnosis not present

## 2022-10-03 DIAGNOSIS — K5781 Diverticulitis of intestine, part unspecified, with perforation and abscess with bleeding: Secondary | ICD-10-CM | POA: Diagnosis not present

## 2022-10-03 DIAGNOSIS — K7581 Nonalcoholic steatohepatitis (NASH): Secondary | ICD-10-CM | POA: Diagnosis not present

## 2022-10-08 DIAGNOSIS — R188 Other ascites: Secondary | ICD-10-CM | POA: Diagnosis not present

## 2022-10-08 DIAGNOSIS — K7460 Unspecified cirrhosis of liver: Secondary | ICD-10-CM | POA: Diagnosis not present

## 2022-10-08 DIAGNOSIS — Z01818 Encounter for other preprocedural examination: Secondary | ICD-10-CM | POA: Diagnosis not present

## 2022-10-08 DIAGNOSIS — R945 Abnormal results of liver function studies: Secondary | ICD-10-CM | POA: Diagnosis not present

## 2022-10-08 DIAGNOSIS — Z6841 Body Mass Index (BMI) 40.0 and over, adult: Secondary | ICD-10-CM | POA: Diagnosis not present

## 2022-10-08 DIAGNOSIS — R601 Generalized edema: Secondary | ICD-10-CM | POA: Diagnosis not present

## 2022-10-08 DIAGNOSIS — I8510 Secondary esophageal varices without bleeding: Secondary | ICD-10-CM | POA: Diagnosis not present

## 2022-10-11 DIAGNOSIS — H401131 Primary open-angle glaucoma, bilateral, mild stage: Secondary | ICD-10-CM | POA: Diagnosis not present

## 2022-10-11 DIAGNOSIS — H2513 Age-related nuclear cataract, bilateral: Secondary | ICD-10-CM | POA: Diagnosis not present

## 2022-10-11 DIAGNOSIS — E119 Type 2 diabetes mellitus without complications: Secondary | ICD-10-CM | POA: Diagnosis not present

## 2022-10-16 DIAGNOSIS — M545 Low back pain, unspecified: Secondary | ICD-10-CM | POA: Diagnosis not present

## 2022-10-16 DIAGNOSIS — G894 Chronic pain syndrome: Secondary | ICD-10-CM | POA: Diagnosis not present

## 2022-10-16 DIAGNOSIS — E119 Type 2 diabetes mellitus without complications: Secondary | ICD-10-CM | POA: Diagnosis not present

## 2022-10-16 DIAGNOSIS — E785 Hyperlipidemia, unspecified: Secondary | ICD-10-CM | POA: Diagnosis not present

## 2022-10-18 DIAGNOSIS — J019 Acute sinusitis, unspecified: Secondary | ICD-10-CM | POA: Diagnosis not present

## 2022-10-24 DIAGNOSIS — K7581 Nonalcoholic steatohepatitis (NASH): Secondary | ICD-10-CM | POA: Diagnosis not present

## 2022-10-24 DIAGNOSIS — R188 Other ascites: Secondary | ICD-10-CM | POA: Diagnosis not present

## 2022-10-24 DIAGNOSIS — K7460 Unspecified cirrhosis of liver: Secondary | ICD-10-CM | POA: Diagnosis not present

## 2022-10-26 ENCOUNTER — Other Ambulatory Visit: Payer: Self-pay | Admitting: Family Medicine

## 2022-10-26 ENCOUNTER — Ambulatory Visit
Admission: RE | Admit: 2022-10-26 | Discharge: 2022-10-26 | Disposition: A | Discharge status: Home / Self Care | Payer: Medicare Other | Source: Ambulatory Visit | Attending: Family Medicine | Admitting: Family Medicine

## 2022-10-26 DIAGNOSIS — E782 Mixed hyperlipidemia: Secondary | ICD-10-CM | POA: Diagnosis not present

## 2022-10-26 DIAGNOSIS — E1169 Type 2 diabetes mellitus with other specified complication: Secondary | ICD-10-CM | POA: Diagnosis not present

## 2022-10-26 DIAGNOSIS — R059 Cough, unspecified: Secondary | ICD-10-CM

## 2022-10-26 DIAGNOSIS — I10 Essential (primary) hypertension: Secondary | ICD-10-CM | POA: Diagnosis not present

## 2022-10-26 DIAGNOSIS — N400 Enlarged prostate without lower urinary tract symptoms: Secondary | ICD-10-CM | POA: Diagnosis not present

## 2022-10-26 DIAGNOSIS — K7460 Unspecified cirrhosis of liver: Secondary | ICD-10-CM | POA: Diagnosis not present

## 2022-10-26 DIAGNOSIS — D696 Thrombocytopenia, unspecified: Secondary | ICD-10-CM | POA: Diagnosis not present

## 2022-10-26 DIAGNOSIS — Z125 Encounter for screening for malignant neoplasm of prostate: Secondary | ICD-10-CM | POA: Diagnosis not present

## 2022-10-26 DIAGNOSIS — Z0000 Encounter for general adult medical examination without abnormal findings: Secondary | ICD-10-CM | POA: Diagnosis not present

## 2022-10-26 DIAGNOSIS — R0609 Other forms of dyspnea: Secondary | ICD-10-CM | POA: Diagnosis not present

## 2022-10-26 DIAGNOSIS — K219 Gastro-esophageal reflux disease without esophagitis: Secondary | ICD-10-CM | POA: Diagnosis not present

## 2022-10-26 DIAGNOSIS — Z1331 Encounter for screening for depression: Secondary | ICD-10-CM | POA: Diagnosis not present

## 2022-10-26 DIAGNOSIS — M545 Low back pain, unspecified: Secondary | ICD-10-CM | POA: Diagnosis not present

## 2022-10-26 DIAGNOSIS — M1712 Unilateral primary osteoarthritis, left knee: Secondary | ICD-10-CM | POA: Diagnosis not present

## 2022-10-29 DIAGNOSIS — L308 Other specified dermatitis: Secondary | ICD-10-CM | POA: Diagnosis not present

## 2022-10-29 DIAGNOSIS — Z85828 Personal history of other malignant neoplasm of skin: Secondary | ICD-10-CM | POA: Diagnosis not present

## 2022-11-05 ENCOUNTER — Ambulatory Visit: Payer: Medicare Other | Attending: Cardiology | Admitting: Cardiology

## 2022-11-05 DIAGNOSIS — L308 Other specified dermatitis: Secondary | ICD-10-CM | POA: Diagnosis not present

## 2022-11-05 DIAGNOSIS — Z85828 Personal history of other malignant neoplasm of skin: Secondary | ICD-10-CM | POA: Diagnosis not present

## 2022-11-13 DIAGNOSIS — Z85828 Personal history of other malignant neoplasm of skin: Secondary | ICD-10-CM | POA: Diagnosis not present

## 2022-11-13 DIAGNOSIS — R188 Other ascites: Secondary | ICD-10-CM | POA: Diagnosis not present

## 2022-11-13 DIAGNOSIS — K7581 Nonalcoholic steatohepatitis (NASH): Secondary | ICD-10-CM | POA: Diagnosis not present

## 2022-11-13 DIAGNOSIS — K7460 Unspecified cirrhosis of liver: Secondary | ICD-10-CM | POA: Diagnosis not present

## 2022-11-13 DIAGNOSIS — L308 Other specified dermatitis: Secondary | ICD-10-CM | POA: Diagnosis not present

## 2022-11-16 DIAGNOSIS — E1169 Type 2 diabetes mellitus with other specified complication: Secondary | ICD-10-CM | POA: Diagnosis not present

## 2022-11-16 DIAGNOSIS — E119 Type 2 diabetes mellitus without complications: Secondary | ICD-10-CM | POA: Diagnosis not present

## 2022-11-16 DIAGNOSIS — K766 Portal hypertension: Secondary | ICD-10-CM | POA: Diagnosis not present

## 2022-11-16 DIAGNOSIS — I8500 Esophageal varices without bleeding: Secondary | ICD-10-CM | POA: Diagnosis not present

## 2022-11-16 DIAGNOSIS — K317 Polyp of stomach and duodenum: Secondary | ICD-10-CM | POA: Diagnosis not present

## 2022-11-16 DIAGNOSIS — K219 Gastro-esophageal reflux disease without esophagitis: Secondary | ICD-10-CM | POA: Diagnosis not present

## 2022-11-16 DIAGNOSIS — K3189 Other diseases of stomach and duodenum: Secondary | ICD-10-CM | POA: Diagnosis not present

## 2022-11-16 DIAGNOSIS — D696 Thrombocytopenia, unspecified: Secondary | ICD-10-CM | POA: Diagnosis not present

## 2022-11-16 DIAGNOSIS — Z79899 Other long term (current) drug therapy: Secondary | ICD-10-CM | POA: Diagnosis not present

## 2022-11-16 DIAGNOSIS — K7460 Unspecified cirrhosis of liver: Secondary | ICD-10-CM | POA: Diagnosis not present

## 2022-11-16 DIAGNOSIS — Z87891 Personal history of nicotine dependence: Secondary | ICD-10-CM | POA: Diagnosis not present

## 2022-11-16 DIAGNOSIS — G4733 Obstructive sleep apnea (adult) (pediatric): Secondary | ICD-10-CM | POA: Diagnosis not present

## 2022-11-16 DIAGNOSIS — F418 Other specified anxiety disorders: Secondary | ICD-10-CM | POA: Diagnosis not present

## 2022-11-16 DIAGNOSIS — I10 Essential (primary) hypertension: Secondary | ICD-10-CM | POA: Diagnosis not present

## 2022-11-16 DIAGNOSIS — K760 Fatty (change of) liver, not elsewhere classified: Secondary | ICD-10-CM | POA: Diagnosis not present

## 2022-11-16 DIAGNOSIS — N400 Enlarged prostate without lower urinary tract symptoms: Secondary | ICD-10-CM | POA: Diagnosis not present

## 2022-11-16 DIAGNOSIS — Z6841 Body Mass Index (BMI) 40.0 and over, adult: Secondary | ICD-10-CM | POA: Diagnosis not present

## 2022-11-16 DIAGNOSIS — E785 Hyperlipidemia, unspecified: Secondary | ICD-10-CM | POA: Diagnosis not present

## 2022-11-16 DIAGNOSIS — Z794 Long term (current) use of insulin: Secondary | ICD-10-CM | POA: Diagnosis not present

## 2022-11-16 DIAGNOSIS — I8510 Secondary esophageal varices without bleeding: Secondary | ICD-10-CM | POA: Diagnosis not present

## 2022-11-16 DIAGNOSIS — K7581 Nonalcoholic steatohepatitis (NASH): Secondary | ICD-10-CM | POA: Diagnosis not present

## 2022-11-22 DIAGNOSIS — N138 Other obstructive and reflux uropathy: Secondary | ICD-10-CM | POA: Diagnosis not present

## 2022-11-22 DIAGNOSIS — N401 Enlarged prostate with lower urinary tract symptoms: Secondary | ICD-10-CM | POA: Diagnosis not present

## 2022-11-27 DIAGNOSIS — I2510 Atherosclerotic heart disease of native coronary artery without angina pectoris: Secondary | ICD-10-CM | POA: Diagnosis not present

## 2022-11-27 DIAGNOSIS — D61818 Other pancytopenia: Secondary | ICD-10-CM | POA: Diagnosis present

## 2022-11-27 DIAGNOSIS — N400 Enlarged prostate without lower urinary tract symptoms: Secondary | ICD-10-CM | POA: Diagnosis present

## 2022-11-27 DIAGNOSIS — I288 Other diseases of pulmonary vessels: Secondary | ICD-10-CM | POA: Diagnosis not present

## 2022-11-27 DIAGNOSIS — K5730 Diverticulosis of large intestine without perforation or abscess without bleeding: Secondary | ICD-10-CM | POA: Diagnosis not present

## 2022-11-27 DIAGNOSIS — E872 Acidosis, unspecified: Secondary | ICD-10-CM | POA: Diagnosis present

## 2022-11-27 DIAGNOSIS — R7881 Bacteremia: Secondary | ICD-10-CM | POA: Diagnosis not present

## 2022-11-27 DIAGNOSIS — R509 Fever, unspecified: Secondary | ICD-10-CM | POA: Diagnosis not present

## 2022-11-27 DIAGNOSIS — G319 Degenerative disease of nervous system, unspecified: Secondary | ICD-10-CM | POA: Diagnosis not present

## 2022-11-27 DIAGNOSIS — I878 Other specified disorders of veins: Secondary | ICD-10-CM | POA: Diagnosis present

## 2022-11-27 DIAGNOSIS — F32A Depression, unspecified: Secondary | ICD-10-CM | POA: Diagnosis present

## 2022-11-27 DIAGNOSIS — L0390 Cellulitis, unspecified: Secondary | ICD-10-CM | POA: Diagnosis present

## 2022-11-27 DIAGNOSIS — E785 Hyperlipidemia, unspecified: Secondary | ICD-10-CM | POA: Diagnosis present

## 2022-11-27 DIAGNOSIS — A412 Sepsis due to unspecified staphylococcus: Secondary | ICD-10-CM | POA: Diagnosis not present

## 2022-11-27 DIAGNOSIS — Z9689 Presence of other specified functional implants: Secondary | ICD-10-CM | POA: Diagnosis not present

## 2022-11-27 DIAGNOSIS — B957 Other staphylococcus as the cause of diseases classified elsewhere: Secondary | ICD-10-CM | POA: Diagnosis not present

## 2022-11-27 DIAGNOSIS — G894 Chronic pain syndrome: Secondary | ICD-10-CM | POA: Diagnosis present

## 2022-11-27 DIAGNOSIS — I517 Cardiomegaly: Secondary | ICD-10-CM | POA: Diagnosis not present

## 2022-11-27 DIAGNOSIS — E1165 Type 2 diabetes mellitus with hyperglycemia: Secondary | ICD-10-CM | POA: Diagnosis present

## 2022-11-27 DIAGNOSIS — E1122 Type 2 diabetes mellitus with diabetic chronic kidney disease: Secondary | ICD-10-CM | POA: Diagnosis present

## 2022-11-27 DIAGNOSIS — G4733 Obstructive sleep apnea (adult) (pediatric): Secondary | ICD-10-CM | POA: Diagnosis present

## 2022-11-27 DIAGNOSIS — K7460 Unspecified cirrhosis of liver: Secondary | ICD-10-CM | POA: Diagnosis present

## 2022-11-27 DIAGNOSIS — F411 Generalized anxiety disorder: Secondary | ICD-10-CM | POA: Diagnosis present

## 2022-11-27 DIAGNOSIS — K219 Gastro-esophageal reflux disease without esophagitis: Secondary | ICD-10-CM | POA: Diagnosis present

## 2022-11-27 DIAGNOSIS — R188 Other ascites: Secondary | ICD-10-CM | POA: Diagnosis not present

## 2022-11-27 DIAGNOSIS — A411 Sepsis due to other specified staphylococcus: Secondary | ICD-10-CM | POA: Diagnosis present

## 2022-11-27 DIAGNOSIS — G9341 Metabolic encephalopathy: Secondary | ICD-10-CM | POA: Diagnosis present

## 2022-11-27 DIAGNOSIS — E871 Hypo-osmolality and hyponatremia: Secondary | ICD-10-CM | POA: Diagnosis present

## 2022-11-27 DIAGNOSIS — R000 Tachycardia, unspecified: Secondary | ICD-10-CM | POA: Diagnosis not present

## 2022-11-27 DIAGNOSIS — R161 Splenomegaly, not elsewhere classified: Secondary | ICD-10-CM | POA: Diagnosis not present

## 2022-11-27 DIAGNOSIS — R739 Hyperglycemia, unspecified: Secondary | ICD-10-CM | POA: Diagnosis not present

## 2022-11-27 DIAGNOSIS — Z96652 Presence of left artificial knee joint: Secondary | ICD-10-CM | POA: Diagnosis not present

## 2022-11-27 DIAGNOSIS — I129 Hypertensive chronic kidney disease with stage 1 through stage 4 chronic kidney disease, or unspecified chronic kidney disease: Secondary | ICD-10-CM | POA: Diagnosis present

## 2022-11-27 DIAGNOSIS — I708 Atherosclerosis of other arteries: Secondary | ICD-10-CM | POA: Diagnosis not present

## 2022-11-27 DIAGNOSIS — R600 Localized edema: Secondary | ICD-10-CM | POA: Diagnosis present

## 2022-11-27 DIAGNOSIS — R9082 White matter disease, unspecified: Secondary | ICD-10-CM | POA: Diagnosis not present

## 2022-11-27 DIAGNOSIS — R4182 Altered mental status, unspecified: Secondary | ICD-10-CM | POA: Diagnosis not present

## 2022-11-27 DIAGNOSIS — K7581 Nonalcoholic steatohepatitis (NASH): Secondary | ICD-10-CM | POA: Diagnosis present

## 2022-11-27 DIAGNOSIS — J8489 Other specified interstitial pulmonary diseases: Secondary | ICD-10-CM | POA: Diagnosis not present

## 2022-11-27 DIAGNOSIS — R8569 Abnormal cytological findings in specimens from other digestive organs and abdominal cavity: Secondary | ICD-10-CM | POA: Diagnosis not present

## 2022-11-27 DIAGNOSIS — A4102 Sepsis due to Methicillin resistant Staphylococcus aureus: Secondary | ICD-10-CM | POA: Diagnosis not present

## 2022-11-27 DIAGNOSIS — M545 Low back pain, unspecified: Secondary | ICD-10-CM | POA: Diagnosis present

## 2022-11-27 DIAGNOSIS — R918 Other nonspecific abnormal finding of lung field: Secondary | ICD-10-CM | POA: Diagnosis not present

## 2022-11-27 DIAGNOSIS — A419 Sepsis, unspecified organism: Secondary | ICD-10-CM | POA: Diagnosis not present

## 2022-11-27 DIAGNOSIS — G9340 Encephalopathy, unspecified: Secondary | ICD-10-CM | POA: Diagnosis not present

## 2022-11-27 DIAGNOSIS — N183 Chronic kidney disease, stage 3 unspecified: Secondary | ICD-10-CM | POA: Diagnosis present

## 2022-11-27 DIAGNOSIS — Z66 Do not resuscitate: Secondary | ICD-10-CM | POA: Diagnosis present

## 2022-11-27 DIAGNOSIS — I10 Essential (primary) hypertension: Secondary | ICD-10-CM | POA: Diagnosis not present

## 2022-11-27 DIAGNOSIS — K7682 Hepatic encephalopathy: Secondary | ICD-10-CM | POA: Diagnosis not present

## 2022-11-28 DIAGNOSIS — A4102 Sepsis due to Methicillin resistant Staphylococcus aureus: Secondary | ICD-10-CM | POA: Diagnosis not present

## 2022-11-28 DIAGNOSIS — Z9689 Presence of other specified functional implants: Secondary | ICD-10-CM | POA: Diagnosis not present

## 2022-11-28 DIAGNOSIS — K7460 Unspecified cirrhosis of liver: Secondary | ICD-10-CM | POA: Diagnosis present

## 2022-11-28 DIAGNOSIS — E871 Hypo-osmolality and hyponatremia: Secondary | ICD-10-CM | POA: Diagnosis present

## 2022-11-28 DIAGNOSIS — Z96652 Presence of left artificial knee joint: Secondary | ICD-10-CM | POA: Diagnosis present

## 2022-11-28 DIAGNOSIS — I10 Essential (primary) hypertension: Secondary | ICD-10-CM | POA: Diagnosis not present

## 2022-11-28 DIAGNOSIS — I129 Hypertensive chronic kidney disease with stage 1 through stage 4 chronic kidney disease, or unspecified chronic kidney disease: Secondary | ICD-10-CM | POA: Diagnosis present

## 2022-11-28 DIAGNOSIS — N183 Chronic kidney disease, stage 3 unspecified: Secondary | ICD-10-CM | POA: Diagnosis present

## 2022-11-28 DIAGNOSIS — G9341 Metabolic encephalopathy: Secondary | ICD-10-CM | POA: Diagnosis present

## 2022-11-28 DIAGNOSIS — G894 Chronic pain syndrome: Secondary | ICD-10-CM | POA: Diagnosis present

## 2022-11-28 DIAGNOSIS — I878 Other specified disorders of veins: Secondary | ICD-10-CM | POA: Diagnosis present

## 2022-11-28 DIAGNOSIS — G4733 Obstructive sleep apnea (adult) (pediatric): Secondary | ICD-10-CM | POA: Diagnosis present

## 2022-11-28 DIAGNOSIS — L0390 Cellulitis, unspecified: Secondary | ICD-10-CM | POA: Diagnosis present

## 2022-11-28 DIAGNOSIS — A412 Sepsis due to unspecified staphylococcus: Secondary | ICD-10-CM | POA: Diagnosis not present

## 2022-11-28 DIAGNOSIS — A411 Sepsis due to other specified staphylococcus: Secondary | ICD-10-CM | POA: Diagnosis present

## 2022-11-28 DIAGNOSIS — K219 Gastro-esophageal reflux disease without esophagitis: Secondary | ICD-10-CM | POA: Diagnosis present

## 2022-11-28 DIAGNOSIS — A419 Sepsis, unspecified organism: Secondary | ICD-10-CM | POA: Diagnosis not present

## 2022-11-28 DIAGNOSIS — I517 Cardiomegaly: Secondary | ICD-10-CM | POA: Diagnosis not present

## 2022-11-28 DIAGNOSIS — N400 Enlarged prostate without lower urinary tract symptoms: Secondary | ICD-10-CM | POA: Diagnosis present

## 2022-11-28 DIAGNOSIS — E872 Acidosis, unspecified: Secondary | ICD-10-CM | POA: Diagnosis present

## 2022-11-28 DIAGNOSIS — Z66 Do not resuscitate: Secondary | ICD-10-CM | POA: Diagnosis present

## 2022-11-28 DIAGNOSIS — R188 Other ascites: Secondary | ICD-10-CM | POA: Diagnosis not present

## 2022-11-28 DIAGNOSIS — K7581 Nonalcoholic steatohepatitis (NASH): Secondary | ICD-10-CM | POA: Diagnosis present

## 2022-11-28 DIAGNOSIS — R509 Fever, unspecified: Secondary | ICD-10-CM | POA: Diagnosis not present

## 2022-11-28 DIAGNOSIS — R7881 Bacteremia: Secondary | ICD-10-CM | POA: Diagnosis not present

## 2022-11-28 DIAGNOSIS — E1122 Type 2 diabetes mellitus with diabetic chronic kidney disease: Secondary | ICD-10-CM | POA: Diagnosis present

## 2022-11-28 DIAGNOSIS — F411 Generalized anxiety disorder: Secondary | ICD-10-CM | POA: Diagnosis present

## 2022-11-28 DIAGNOSIS — F32A Depression, unspecified: Secondary | ICD-10-CM | POA: Diagnosis present

## 2022-11-28 DIAGNOSIS — R8569 Abnormal cytological findings in specimens from other digestive organs and abdominal cavity: Secondary | ICD-10-CM | POA: Diagnosis not present

## 2022-11-28 DIAGNOSIS — R600 Localized edema: Secondary | ICD-10-CM | POA: Diagnosis present

## 2022-11-28 DIAGNOSIS — G9340 Encephalopathy, unspecified: Secondary | ICD-10-CM | POA: Diagnosis not present

## 2022-11-28 DIAGNOSIS — E1165 Type 2 diabetes mellitus with hyperglycemia: Secondary | ICD-10-CM | POA: Diagnosis present

## 2022-11-28 DIAGNOSIS — M545 Low back pain, unspecified: Secondary | ICD-10-CM | POA: Diagnosis present

## 2022-11-28 DIAGNOSIS — E785 Hyperlipidemia, unspecified: Secondary | ICD-10-CM | POA: Diagnosis present

## 2022-11-28 DIAGNOSIS — D61818 Other pancytopenia: Secondary | ICD-10-CM | POA: Diagnosis present

## 2022-11-28 DIAGNOSIS — B957 Other staphylococcus as the cause of diseases classified elsewhere: Secondary | ICD-10-CM | POA: Diagnosis not present

## 2022-11-29 DIAGNOSIS — R8569 Abnormal cytological findings in specimens from other digestive organs and abdominal cavity: Secondary | ICD-10-CM | POA: Diagnosis not present

## 2022-12-04 ENCOUNTER — Ambulatory Visit: Payer: Medicare Other | Admitting: Psychiatry

## 2022-12-05 ENCOUNTER — Other Ambulatory Visit: Payer: Self-pay | Admitting: Psychiatry

## 2022-12-05 DIAGNOSIS — F4001 Agoraphobia with panic disorder: Secondary | ICD-10-CM

## 2022-12-05 DIAGNOSIS — F5105 Insomnia due to other mental disorder: Secondary | ICD-10-CM

## 2022-12-05 DIAGNOSIS — F411 Generalized anxiety disorder: Secondary | ICD-10-CM

## 2022-12-05 NOTE — Telephone Encounter (Signed)
Per office note on 04/22 it says "Continue alprazolam to 2 mg 4 times daily."; The rx is for 3x time daily. I called and verified with pt that he is taking 3x daily. LF 09/29; he is not due for a rf until 10//27; LV 04/22

## 2022-12-06 ENCOUNTER — Telehealth: Payer: Self-pay | Admitting: Psychiatry

## 2022-12-06 NOTE — Telephone Encounter (Signed)
Pharmacy has Rx, just too early to fill. Patient notified.

## 2022-12-06 NOTE — Telephone Encounter (Signed)
Patient called in regarding Xanax prescription states that pharmacy didn't receive prescription yesterday and needs it resent. Ph: [phone] Appt 11/5 Pharmacy [facility] [address] [place]

## 2022-12-07 DIAGNOSIS — Z23 Encounter for immunization: Secondary | ICD-10-CM | POA: Diagnosis not present

## 2022-12-07 DIAGNOSIS — D696 Thrombocytopenia, unspecified: Secondary | ICD-10-CM | POA: Diagnosis not present

## 2022-12-07 DIAGNOSIS — K7460 Unspecified cirrhosis of liver: Secondary | ICD-10-CM | POA: Diagnosis not present

## 2022-12-07 DIAGNOSIS — R5381 Other malaise: Secondary | ICD-10-CM | POA: Diagnosis not present

## 2022-12-07 DIAGNOSIS — Z8619 Personal history of other infectious and parasitic diseases: Secondary | ICD-10-CM | POA: Diagnosis not present

## 2022-12-09 ENCOUNTER — Other Ambulatory Visit: Payer: Self-pay | Admitting: Psychiatry

## 2022-12-09 DIAGNOSIS — F5105 Insomnia due to other mental disorder: Secondary | ICD-10-CM

## 2022-12-11 DIAGNOSIS — Z85828 Personal history of other malignant neoplasm of skin: Secondary | ICD-10-CM | POA: Diagnosis not present

## 2022-12-11 DIAGNOSIS — L308 Other specified dermatitis: Secondary | ICD-10-CM | POA: Diagnosis not present

## 2022-12-12 DIAGNOSIS — K7460 Unspecified cirrhosis of liver: Secondary | ICD-10-CM | POA: Diagnosis not present

## 2022-12-12 DIAGNOSIS — R188 Other ascites: Secondary | ICD-10-CM | POA: Diagnosis not present

## 2022-12-12 DIAGNOSIS — I8510 Secondary esophageal varices without bleeding: Secondary | ICD-10-CM | POA: Diagnosis not present

## 2022-12-12 DIAGNOSIS — D61818 Other pancytopenia: Secondary | ICD-10-CM | POA: Diagnosis not present

## 2022-12-12 DIAGNOSIS — K7682 Hepatic encephalopathy: Secondary | ICD-10-CM | POA: Diagnosis not present

## 2022-12-12 DIAGNOSIS — K7581 Nonalcoholic steatohepatitis (NASH): Secondary | ICD-10-CM | POA: Diagnosis not present

## 2022-12-13 DIAGNOSIS — Z6841 Body Mass Index (BMI) 40.0 and over, adult: Secondary | ICD-10-CM | POA: Diagnosis not present

## 2022-12-13 DIAGNOSIS — Z96652 Presence of left artificial knee joint: Secondary | ICD-10-CM | POA: Diagnosis not present

## 2022-12-13 DIAGNOSIS — I10 Essential (primary) hypertension: Secondary | ICD-10-CM | POA: Diagnosis not present

## 2022-12-13 DIAGNOSIS — Z794 Long term (current) use of insulin: Secondary | ICD-10-CM | POA: Diagnosis not present

## 2022-12-13 DIAGNOSIS — K760 Fatty (change of) liver, not elsewhere classified: Secondary | ICD-10-CM | POA: Diagnosis not present

## 2022-12-13 DIAGNOSIS — E1165 Type 2 diabetes mellitus with hyperglycemia: Secondary | ICD-10-CM | POA: Diagnosis not present

## 2022-12-13 DIAGNOSIS — F32A Depression, unspecified: Secondary | ICD-10-CM | POA: Diagnosis not present

## 2022-12-13 DIAGNOSIS — F419 Anxiety disorder, unspecified: Secondary | ICD-10-CM | POA: Diagnosis not present

## 2022-12-13 DIAGNOSIS — Z87891 Personal history of nicotine dependence: Secondary | ICD-10-CM | POA: Diagnosis not present

## 2022-12-13 DIAGNOSIS — B1910 Unspecified viral hepatitis B without hepatic coma: Secondary | ICD-10-CM | POA: Diagnosis not present

## 2022-12-13 DIAGNOSIS — G4733 Obstructive sleep apnea (adult) (pediatric): Secondary | ICD-10-CM | POA: Diagnosis not present

## 2022-12-13 DIAGNOSIS — Z79899 Other long term (current) drug therapy: Secondary | ICD-10-CM | POA: Diagnosis not present

## 2022-12-13 DIAGNOSIS — M51361 Other intervertebral disc degeneration, lumbar region with lower extremity pain only: Secondary | ICD-10-CM | POA: Diagnosis not present

## 2022-12-13 DIAGNOSIS — K219 Gastro-esophageal reflux disease without esophagitis: Secondary | ICD-10-CM | POA: Diagnosis not present

## 2022-12-13 DIAGNOSIS — G894 Chronic pain syndrome: Secondary | ICD-10-CM | POA: Diagnosis not present

## 2022-12-13 DIAGNOSIS — E782 Mixed hyperlipidemia: Secondary | ICD-10-CM | POA: Diagnosis not present

## 2022-12-13 DIAGNOSIS — D696 Thrombocytopenia, unspecified: Secondary | ICD-10-CM | POA: Diagnosis not present

## 2022-12-13 DIAGNOSIS — Z556 Problems related to health literacy: Secondary | ICD-10-CM | POA: Diagnosis not present

## 2022-12-13 DIAGNOSIS — Z85828 Personal history of other malignant neoplasm of skin: Secondary | ICD-10-CM | POA: Diagnosis not present

## 2022-12-13 DIAGNOSIS — K7460 Unspecified cirrhosis of liver: Secondary | ICD-10-CM | POA: Diagnosis not present

## 2022-12-13 DIAGNOSIS — G4700 Insomnia, unspecified: Secondary | ICD-10-CM | POA: Diagnosis not present

## 2022-12-18 ENCOUNTER — Ambulatory Visit: Payer: Medicare Other | Admitting: Psychiatry

## 2022-12-18 ENCOUNTER — Encounter: Payer: Self-pay | Admitting: Psychiatry

## 2022-12-18 DIAGNOSIS — F331 Major depressive disorder, recurrent, moderate: Secondary | ICD-10-CM

## 2022-12-18 DIAGNOSIS — F4001 Agoraphobia with panic disorder: Secondary | ICD-10-CM | POA: Diagnosis not present

## 2022-12-18 DIAGNOSIS — F411 Generalized anxiety disorder: Secondary | ICD-10-CM | POA: Diagnosis not present

## 2022-12-18 DIAGNOSIS — F5105 Insomnia due to other mental disorder: Secondary | ICD-10-CM | POA: Diagnosis not present

## 2022-12-18 MED ORDER — SELEGILINE HCL 5 MG PO TABS: ORAL_TABLET | ORAL | 1 refills | Status: DC

## 2022-12-18 MED ORDER — QUETIAPINE FUMARATE 200 MG PO TABS
400.0000 mg | ORAL_TABLET | Freq: Every day | ORAL | 5 refills | Status: DC
Start: 2022-12-18 — End: 2023-03-06

## 2022-12-18 MED ORDER — ALPRAZOLAM 2 MG PO TABS: 2.0000 mg | ORAL_TABLET | Freq: Three times a day (TID) | ORAL | 1 refills | Status: DC

## 2022-12-18 NOTE — Progress Notes (Signed)
Victor [name] [id number] 12-Mar-1960 62 y.o.   Subjective:   Patient ID:  Victor [name] is a 62 y.o. (DOB 1960/07/03) male.  Chief Complaint:  Chief Complaint  Patient presents with   Follow-up   Depression   Anxiety   Sleeping Problem    Depression        Associated symptoms include fatigue.  Associated symptoms include no decreased concentration and no suicidal ideas.  Past medical history includes anxiety.   Anxiety Patient reports no confusion, decreased concentration, dizziness, nervous/anxious behavior or suicidal ideas.     [name] presents for follow-up of his psychiatric conditions today.  At visit Jul 04, 2018.  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  At  visit June 2020.  He has had problems with insomnia related to selegiline apparently.  We reduced the dose to 7.5 mg and gave it all in the morning.  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  He had benefit from selegiline 10 mg and then 7.5 mg but SE insomnia.  We also added low-dose quetiapine at night to help with sleep and potentially with depression as well.   seen August 2020.  He ended up using hydroxyzine instead of quetiapine for sleep.  He was satisfied with his meds overall.  Anxiety and depression were improved but not resolved on selegiline 7.5 mg every morning.  No meds were changed. Off and on taking selegiline 10 mg am about 4 days weekly and other days 7.5 mg am.  It has clearly helped the depression which is under control.  Xanax helps the anxiety.  seen November 2020.  The following med decisions were made: Increase hydroxyzine 50 mg to 2 HS for sleep. Continue selegiline 7.5 mg each morning DT SE insomnia.  He had worsening symptoms of depression and anxiety when he reduced the dose to 5 mg daily.   04/29/19 appt without med changes and following noted: B-in-law died at 30 at [facility] with Covid.  Pt having problems with low platelets.   Further workup ongoing.  Sometimess feels funny in afternoon with 7.5 mg selegiline and will intermittently reduce the dose.  Still CO trouble with sleep. Drowsy if still in the afternoon. Still issues with sleep and then gets mad he can't go to sleep.  3-4 hours of sleep total.  No caffeine after lunch.  No napping.  Says quetiapine didn't help sleep but is taking hydroxyzine 50 and asks to increase it. He dropped the selegiline to 7.5 mg but still had SE.  Then reduced to 5 mg and did OK for awhile but then gradually more depression with stressors.   Tolerated it fine this time.  It is helping.  Less anxious.  Has helped the depression until the other things occurred. Was doing fine until Covid.  Is high risk with workup for leukemia with low WBC.  That's working against him.  10/28/19 appt with the following noted: They expect me to die.  Severe nonalcoholic cirrhosis.  Requiring ascites drainage.  Severe blood abnormalities.  Not ready to die.  Doctor said to get his affairs in order.  Trying to get wife ready and it's very hard.  Hard to handle.  No timeline on lifespan.  Doesn't expect to be eligible for liver transplant.   Has had 27 surgeries.  Wonders about blood cancer too bc platelet count is so low.  Esophageal varices required surgery. Has to take it one day at a  time.  Wake at night thinking about it and having NM about it. No concerns about psych meds from medical perspective per the doctors.   Restrictive diet. Taking Xanax TID and feels like he needs another in the middle of the night.  It calms him way down but doesn't make him sleep.  Will have heart racing fear and the Xanax takes it down. Takes it 730, 12 noon and 6:30 and then needs another in middle of night. No energy due to illness. Plan: Increase alprazolam to 2 mg 4 times daily. Because hydroxyzine was not helpful he was encouraged to stop it.  04/26/20 appt noted: Don't go anywhere bc immune system.  Doing OK.   Says  quetiapine went from $30 to $120 for  3 mos supplytaking 200 mg HS.  Sleep is better and pretty good overall.  To bed 11 and awaken 3 a No SE. nd 530 and up 6 and getting 6 hours which is good. Helped more than anything else. There is better overall mood and anxiety wise than in a long time.  Satisfied with meds and does not want changes. Plan no med changes  10/13/20 appt noted: TKR 2 weeks ago.  OK so far except pain.  This has interfered with sleep but is getting better. Overall feels good for what he's been through and satisfied with each of med and tolerating well without excessive sedation. Pt reports that mood is Anxious and Depressed and both are improved with the selegiline. Extra Xanax made a big difference with anxiety.  No napping.   Anxiety symptoms include: Excessive Worry,.  Pt reports that appetite is decreased. Pt reports that energy is improved and improved. Concentration is improved. Suicidal thoughts:  denied by patient. . Suicidal thoughts:  denied by patient. Plan: cont selegiline 7.5 mg AM and Seroquel 200 mg HS  04/13/21 appt noted: Had to increase Seroquel to 400 mg HS and Selegiline 7.5 mg AM Sleep better with Seroquel 400 mg HS and taking Xanax.   Depression is managed "you can't get a whole lot better". Anxiety is manageable. No SE Uses CPAP  10/18/21 appt noted: Doing ok with meds. Dep and anxiety are controlled.  Sleep is ok usually . Seroquel to 400 mg HS and Selegiline 7.5 mg AM Still some knee problems after surgery.  06/04/22 appt noted: Son committed suicide last month with GSW..  son's wife is estranged from them making it worse.   Info is being kept from pt's family.  [facility] but pt and his wife not invited.  Pt thinks suicide related to marital problems. Pt's wife really upset and hard to take care of her right now.   Tolerating meds and not ovre sedated. Continues Xanax 2 mg QID, Seroquel 400 HS, selegiline 5 mg AM. Was fine until this  happens. Plan no changes  12/18/22 appt noted: Psych med: Continues Xanax 2 mg QID, Seroquel 400 HS, selegiline 5 mg AM. On 11/27/22 woke up unresponsive hosp for acute febrile illness, metabolic encephalopathy.  Has recovered.  Has PT twice weekly for weakness.  Sleep back to normal. No SE.  No concerns with meds.  Things going better with meds now.   Has CPAP.  New.    No increase in pain meds usually.       No caffeine.   Uses CPAP except lately because of having nose cancer surgery.  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 Gets tired and drowsy in the day without sleep.  No caffeine after 9 pm.    Past Psychiatric Medication Trials: Trazodone, Wellbutrin, mirtazapine, duloxetine, paroxetine, citalopram, Vivactil, selegiline (Emsam) for several years with good response until 2016  and stopped DT cost,   quetiapine 100, Xanax 2 mg at night then to 4 times daily, Sonata, Lunesta, Ambien, Rozerem, hydroxyzine hs NR  Adopted Son died suicide 2022/05/11. (Raised him from boy beng 92 yo).  Son's bio father committed suicide.  Wife's got family hx suicide and   Review of Systems:  Review of Systems  Constitutional:  Positive for fatigue.  Musculoskeletal:  Positive for arthralgias, back pain, gait problem and joint swelling.  Neurological:  Negative for dizziness, tremors and weakness.  Psychiatric/Behavioral:  Negative for agitation, behavioral problems, confusion, decreased concentration, dysphoric mood, hallucinations, self-injury, sleep disturbance and suicidal ideas. The patient is not nervous/anxious and is not hyperactive.     Medications: I have reviewed the patient's current medications.  Current Outpatient Medications  Medication Sig Dispense Refill   aspirin EC 81 MG tablet Take 1 tablet (81 mg total) by mouth daily. Swallow whole. 90 tablet 3   atenolol (TENORMIN) 25 MG tablet Take 25 mg by mouth in the morning.     atorvastatin (LIPITOR) 80 MG tablet Take 1 tablet (80 mg total) by mouth  daily. 90 tablet 3   cyanocobalamin (VITAMIN B12) 1000 MCG tablet Take 1,000 mcg by mouth in the morning.     diphenhydrAMINE (BENADRYL) 50 MG capsule Take one capsule 1 hour prior to scan. 1 capsule 0   ferrous sulfate 325 (65 FE) MG tablet Take 325 mg by mouth in the morning and at bedtime. Morning & supper     furosemide (LASIX) 40 MG tablet Take 60 mg by mouth in the morning.     HYDROmorphone (DILAUDID) 4 MG tablet Take 4 mg by mouth 4 (four) times daily as needed (pain.).     lactulose (CHRONULAC) 10 GM/15ML solution Take 20 g by mouth daily as needed for severe constipation. TAKE 30 MLS (20 G DOSE) BY MOUTH DAILY. TAKE DAILY. IF NEEDED, INCREASE DOSE TO HAVE 2-3 BOWEL MOVEMENTS A DAY AND TO AVOID CONFUSION/FOGGINESS.     latanoprost (XALATAN) 0.005 % ophthalmic solution Place 1 drop into both eyes at bedtime.     losartan (COZAAR) 50 MG tablet Take 50 mg by mouth in the morning.     NOVOLOG FLEXPEN 100 UNIT/ML FlexPen Inject 40 Units into the skin with breakfast, with lunch, and with evening meal.     pantoprazole (PROTONIX) 40 MG tablet Take 40 mg by mouth See admin instructions. Take 1 tablet (40 mg) by mouth scheduled every morning & may repeat a dose (40 mg) in the afternoon if needed for indigestion/heartburn.  0   spironolactone (ALDACTONE) 100 MG tablet Take 200 mg by mouth in the morning.     TRESIBA FLEXTOUCH 200 UNIT/ML FlexTouch Pen Inject 80 Units into the skin in the morning and at bedtime.     triamcinolone cream (KENALOG) 0.1 % Apply 1 Application topically 3 (three) times daily.     vitamin E 400 UNIT capsule Take 400 Units by mouth in the morning.     alprazolam (XANAX) 2 MG tablet Take 1 tablet (2 mg total) by mouth in the morning, at noon, and at bedtime. 90 tablet 1   metoprolol tartrate (LOPRESSOR) 100 MG tablet Take 1 tablet (100 mg total) by mouth once for 1 dose. Take 90-120 minutes prior to scan. 1 tablet 0  QUEtiapine (SEROQUEL) 200 MG tablet Take 2 tablets (400 mg  total) by mouth at bedtime. 60 tablet 5   selegiline (ELDEPRYL) 5 MG tablet TAKE 1 & 1/2 TABLETS BY MOUTH EVERY MORNING 135 tablet 1   No current facility-administered medications for this visit.   Facility-Administered Medications Ordered in Other Visits  Medication Dose Route Frequency Provider Last Rate Last Admin   0.9 %  sodium chloride infusion   Intravenous Once Axel Filler, MD        Medication Side Effects: Insomnia  Allergies:  Allergies  Allergen Reactions   Canagliflozin Itching and Other (See Comments)    Yeast infections  pancreatitis Yeast infections  pancreatitis Yeast infections   Cyclobenzaprine Anaphylaxis and Other (See Comments)    REACTION:  Not compatible with Emsam patch---not taking this patch any longer    Gabapentin Other (See Comments)    Other reaction(s): shortness of breath/tardive dyskinesia   Tanzeum [Albiglutide] Other (See Comments)    PANCREATITIS   Hydrocodone-Acetaminophen Hives    REACTION: pt turns red and has hot flashes    Iodinated Contrast Media Rash    1 EPISODE, RELIEVED WITH BENADRYL   Iohexol Hives    After CT a/p. Gave him Benadryl 25mg  PO for fewer than 10 hives on face/neck.  No swelling or airway issues. Larina Earthly, RN After CT a/p. Gave him Benadryl 25mg  PO for fewer than 10 hives on face/neck.  No swelling or airway issues. Larina Earthly, RN   Metformin And Related Diarrhea   Nsaids Diarrhea and Other (See Comments)    Other reaction(s): stomach upset Stomach Upset  Other reaction(s): stomach upset Stomach Upset Stomach Upset   Other Other (See Comments)   Prednisone Other (See Comments)    Allergic to all steroids- increase blood glucose   Vicodin [Hydrocodone-Acetaminophen] Hives and Other (See Comments)    Other reaction(s): hives/itching (although he is able to tolerate Percocet) REACTION: pt turns red and has hot flashes     Past Medical History:  Diagnosis Date   Anxiety    takes Xanax daily as needed    Blood dyscrasia    thrombocytopenia    Blood transfusion    platelets   C. difficile colitis 03/2015   CAD (coronary artery disease), native coronary artery    coronary calcium score of 844 with 50-69% pRCA, 25-49% dLM, 50-69% oLAD, 70-99% small oD1, 25-495 pLCx with FFR demonstrating no significant flow limiting lesions in the main epicardial vessels by CTA/FFR 07/2022   Depression    Emsam patch daily   Diabetes mellitus     Type II    GERD (gastroesophageal reflux disease)    takes Protonix daily   Hepatitis    hepatitis b/ newly diagnosed with portal hypertension   Hepatitis B virus infection 03/2007   History of kidney stones    passed- x 2   History of shingles    Hyperlipidemia    takes AtorvaStatin daily   Hypertension    takes Atenolol and Lisinopril daily   Obstructive sleep apnea (adult) (pediatric)    mild with AHI 11.61/hr now on CPAP at 15cm h2o, CPAP is used q night    Osteoarthritis    Pancreatitis    Sciatica    lumbar region - 2010, also has had numerous injections    Splenomegaly    LOV Dr Gaylyn Rong  12/12 on chart   Thrombocytopenia (HCC)     Family History  Problem Relation Age of Onset   Cancer  Mother     Social History   Socioeconomic History   Marital status: Married    Spouse name: Not on file   Number of children: Not on file   Years of education: Not on file   Highest education level: Not on file  Occupational History   Not on file  Tobacco Use   Smoking status: Former    Current packs/day: 0.00    Average packs/day: 1 pack/day for 15.0 years (15.0 ttl pk-yrs)    Types: Cigarettes    Start date: 01/01/1971    Quit date: 12/31/1985    Years since quitting: 36.9   Smokeless tobacco: Never  Vaping Use   Vaping status: Never Used  Substance and Sexual Activity   Alcohol use: No   Drug use: No   Sexual activity: Not on file  Other Topics Concern   Not on file  Social History Narrative   Not on file   Social Determinants of Health    Financial Resource Strain: Low Risk  (04/29/2022)   Received from [facility], [facility]   Overall Financial Resource Strain (CARDIA)    Difficulty of Paying Living Expenses: Not hard at all  Food Insecurity: No Food Insecurity (11/28/2022)   Received from [facility]   Hunger Vital Sign    Worried About Running Out of Food in the Last Year: Never true    Ran Out of Food in the Last Year: Never true  Transportation Needs: No Transportation Needs (11/28/2022)   Received from [facility] - Transportation    Lack of Transportation (Medical): No    Lack of Transportation (Non-Medical): No  Physical Activity: Not on file  Stress: Stress Concern Present (11/28/2022)   Received from [facility] - Occupational Stress Questionnaire    Feeling of Stress : Rather much  Social Connections: Unknown (06/12/2021)   Received [facility], [facility]   Social Network    Social Network: Not on file  Intimate Partner Violence: Not At Risk (11/27/2022)   Received from [facility]   HITS    Over the last 12 months how often did your partner physically hurt you?: 1    Over the last 12 months how often did your partner insult you or talk down to you?: 1    Over the last 12 months how often did your partner threaten you with physical harm?: 1    Over the last 12 months how often did your partner scream or curse at you?: 1    Past Medical History, Surgical history, Social history, and Family history were reviewed and updated as appropriate.   Please see review of systems for further details on the patient's review from today.   Objective:   Physical Exam:  There were no vitals taken for this visit.  Physical Exam Constitutional:      General: He is not in acute distress. Musculoskeletal:        General: No deformity.  Neurological:     Mental Status: He is alert and oriented to person, place, and time.     Cranial  Nerves: No dysarthria.     Coordination: Coordination normal.  Psychiatric:        Attention and Perception: Attention and perception normal. He does not perceive auditory or visual hallucinations.        Mood and Affect: Mood is anxious and depressed. Affect is not labile, blunt or inappropriate.  Speech: Speech normal.        Behavior: Behavior normal. Behavior is cooperative.        Thought Content: Thought content normal. Thought content is not paranoid or delusional. Thought content does not include homicidal or suicidal ideation. Thought content does not include suicidal plan.        Cognition and Memory: Cognition and memory normal.        Judgment: Judgment normal.     Comments: Insight intact More dysphoric over son's suicide March 2024     Lab Review:     Component Value Date/Time   NA 136 06/22/2022 1059   K 3.8 06/22/2022 1059   CL 101 06/22/2022 1059   CO2 23 06/22/2022 1059   GLUCOSE 157 (H) 06/22/2022 1059   GLUCOSE 195 (H) 07/21/2019 1125   BUN 16 06/22/2022 1059   CREATININE 1.17 06/22/2022 1059   CALCIUM 8.8 06/22/2022 1059   PROT 6.2 (L) 07/21/2019 1125   ALBUMIN 3.0 (L) 07/21/2019 1125   AST 27 07/21/2019 1125   ALT 29 07/21/2019 1125   ALKPHOS 84 07/21/2019 1125   BILITOT 1.1 07/21/2019 1125   GFRNONAA >60 07/21/2019 1125   GFRAA >60 07/21/2019 1125       Component Value Date/Time   WBC 3.2 (L) 07/21/2019 1125   RBC 4.28 07/21/2019 1125   HGB 12.6 (L) 07/21/2019 1125   HGB 13.7 01/22/2011 1348   HCT 37.9 (L) 07/21/2019 1125   HCT 40.1 01/22/2011 1348   PLT 41 (L) 07/21/2019 1125   PLT 59 (L) 01/22/2011 1348   MCV 88.6 07/21/2019 1125   MCV 87.9 01/22/2011 1348   MCH 29.4 07/21/2019 1125   MCHC 33.2 07/21/2019 1125   RDW 14.8 07/21/2019 1125   RDW 13.9 01/22/2011 1348   LYMPHSABS 1.2 04/22/2015 1423   LYMPHSABS 1.1 01/22/2011 1348   MONOABS 0.3 04/22/2015 1423   MONOABS 0.3 01/22/2011 1348   EOSABS 0.1 04/22/2015 1423   EOSABS 0.1  01/22/2011 1348   BASOSABS 0.0 04/22/2015 1423   BASOSABS 0.0 01/22/2011 1348    No results found for: "POCLITH", "LITHIUM"   No results found for: "PHENYTOIN", "PHENOBARB", "VALPROATE", "CBMZ"   .res Assessment: Plan:    Keymari was seen today for follow-up, depression, anxiety and sleeping problem.  Diagnoses and all orders for this visit:  Major depressive disorder, recurrent episode, moderate (HCC) -     selegiline (ELDEPRYL) 5 MG tablet; TAKE 1 & 1/2 TABLETS BY MOUTH EVERY MORNING -     QUEtiapine (SEROQUEL) 200 MG tablet; Take 2 tablets (400 mg total) by mouth at bedtime.  Generalized anxiety disorder -     alprazolam (XANAX) 2 MG tablet; Take 1 tablet (2 mg total) by mouth in the morning, at noon, and at bedtime.  Insomnia due to mental condition -     alprazolam (XANAX) 2 MG tablet; Take 1 tablet (2 mg total) by mouth in the morning, at noon, and at bedtime. -     QUEtiapine (SEROQUEL) 200 MG tablet; Take 2 tablets (400 mg total) by mouth at bedtime.  Panic disorder with agoraphobia -     alprazolam (XANAX) 2 MG tablet; Take 1 tablet (2 mg total) by mouth in the morning, at noon, and at bedtime.   30 min face to face time with patient was spent on counseling and coordination of care. We discussed Patient with a long history of recurrent major depression panic disorder generalized anxiety disorder and insomnia.  He had  a good response to selegiline over a period of number of years but stopped it in 2017 due to expense of the Emsam patch.  .  He is failed multiple other SSRIs and SNRIs.  His depression responded to oral selegiline.  His anxiety is improved with the increase in alprazolam to the maximum 2 mg 4 times daily.  He is tolerating it well.    Panic and anxiety symptoms are pretty well controlled at present. Had hosp since here for sepsis but no sx now and mental health stable.  We used the selegiline tablets in place of the patch as it is more affordable.  He started  selegiline 5 mg half twice daily for 3 days then 1 twice dail.  We discussed side effects in detail including MAO inhibitors restrictions regarding medication interactions.  Unfortunately he had significant problems with insomnia which is a known side effect possibility and some tension.   However his depression and energy are improved and he's satisfied with meds. Insomnia was a problem a.  Resolved.  Tolerating meds Insomnia resolved with quetiapine for ahwile but had to increase to 400 mg nightly.  We discussed the use of good Rx as a way to get around high cost of quetiapine with his insurance.  Continue alprazolam to 2 mg 4 times daily.He is aware that this is a high dose and of asked him not to take other sedatives at night.  We discussed the fall risk and that given his low platelet count if he has a fall he is at increased risk of complications of bleeding.  He understands this risk.  He states it is not overly sedating to him when he takes it during the day. Does not appear sedated.  Disc increased risk with opiates. No abuse evidence.  Continue selegiline 5 mg each morning DT SE insomnia.  He had worsening symptoms of depression and anxiety when he reduced the dose to 5 mg daily. Disc DDI with tramadol and avoid this.  Discussed potential metabolic side effects associated with atypical antipsychotics, as well as potential risk for movement side effects. Advised pt to contact office if movement side effects occur.   We discussed the short-term risks associated with benzodiazepines including sedation and increased fall risk among others.  Discussed long-term side effect risk including dependence, potential withdrawal symptoms, and the potential eventual dose-related risk of dementia.  But recent studies from 2020 dispute this association between benzodiazepines and dementia risk. Newer studies in 2020 do not support an association with dementia.  Again discussed MAO inhibitor restrictions and  especially drug interaction issues.  FU 6 months  Meredith Staggers, MD, DFAPA    Please see After Visit Summary for patient specific instructions.  No future appointments.   No orders of the defined types were placed in this encounter.      -------------------------------

## 2022-12-19 DIAGNOSIS — E1165 Type 2 diabetes mellitus with hyperglycemia: Secondary | ICD-10-CM | POA: Diagnosis not present

## 2022-12-19 DIAGNOSIS — Z794 Long term (current) use of insulin: Secondary | ICD-10-CM | POA: Diagnosis not present

## 2022-12-19 DIAGNOSIS — K7460 Unspecified cirrhosis of liver: Secondary | ICD-10-CM | POA: Diagnosis not present

## 2022-12-19 DIAGNOSIS — Z6841 Body Mass Index (BMI) 40.0 and over, adult: Secondary | ICD-10-CM | POA: Diagnosis not present

## 2022-12-19 DIAGNOSIS — D696 Thrombocytopenia, unspecified: Secondary | ICD-10-CM | POA: Diagnosis not present

## 2022-12-20 DIAGNOSIS — Z6841 Body Mass Index (BMI) 40.0 and over, adult: Secondary | ICD-10-CM | POA: Diagnosis not present

## 2022-12-20 DIAGNOSIS — K7460 Unspecified cirrhosis of liver: Secondary | ICD-10-CM | POA: Diagnosis not present

## 2022-12-20 DIAGNOSIS — E1165 Type 2 diabetes mellitus with hyperglycemia: Secondary | ICD-10-CM | POA: Diagnosis not present

## 2022-12-20 DIAGNOSIS — Z794 Long term (current) use of insulin: Secondary | ICD-10-CM | POA: Diagnosis not present

## 2022-12-20 DIAGNOSIS — D696 Thrombocytopenia, unspecified: Secondary | ICD-10-CM | POA: Diagnosis not present

## 2022-12-21 DIAGNOSIS — K7581 Nonalcoholic steatohepatitis (NASH): Secondary | ICD-10-CM | POA: Diagnosis not present

## 2022-12-21 DIAGNOSIS — D696 Thrombocytopenia, unspecified: Secondary | ICD-10-CM | POA: Diagnosis not present

## 2022-12-21 DIAGNOSIS — R161 Splenomegaly, not elsewhere classified: Secondary | ICD-10-CM | POA: Diagnosis not present

## 2022-12-21 DIAGNOSIS — R7881 Bacteremia: Secondary | ICD-10-CM | POA: Diagnosis not present

## 2022-12-21 DIAGNOSIS — K7460 Unspecified cirrhosis of liver: Secondary | ICD-10-CM | POA: Diagnosis not present

## 2022-12-21 DIAGNOSIS — B957 Other staphylococcus as the cause of diseases classified elsewhere: Secondary | ICD-10-CM | POA: Diagnosis not present

## 2022-12-24 DIAGNOSIS — E538 Deficiency of other specified B group vitamins: Secondary | ICD-10-CM | POA: Diagnosis not present

## 2022-12-24 DIAGNOSIS — E611 Iron deficiency: Secondary | ICD-10-CM | POA: Diagnosis not present

## 2022-12-24 DIAGNOSIS — D61818 Other pancytopenia: Secondary | ICD-10-CM | POA: Diagnosis not present

## 2022-12-31 DIAGNOSIS — R188 Other ascites: Secondary | ICD-10-CM | POA: Diagnosis not present

## 2022-12-31 DIAGNOSIS — K7581 Nonalcoholic steatohepatitis (NASH): Secondary | ICD-10-CM | POA: Diagnosis not present

## 2022-12-31 DIAGNOSIS — K7460 Unspecified cirrhosis of liver: Secondary | ICD-10-CM | POA: Diagnosis not present

## 2023-01-03 DIAGNOSIS — L308 Other specified dermatitis: Secondary | ICD-10-CM | POA: Diagnosis not present

## 2023-01-03 DIAGNOSIS — Z85828 Personal history of other malignant neoplasm of skin: Secondary | ICD-10-CM | POA: Diagnosis not present

## 2023-01-07 DIAGNOSIS — D61818 Other pancytopenia: Secondary | ICD-10-CM | POA: Diagnosis not present

## 2023-01-07 DIAGNOSIS — E538 Deficiency of other specified B group vitamins: Secondary | ICD-10-CM | POA: Diagnosis not present

## 2023-01-07 DIAGNOSIS — E611 Iron deficiency: Secondary | ICD-10-CM | POA: Diagnosis not present

## 2023-01-08 DIAGNOSIS — Z85828 Personal history of other malignant neoplasm of skin: Secondary | ICD-10-CM | POA: Diagnosis not present

## 2023-01-08 DIAGNOSIS — L308 Other specified dermatitis: Secondary | ICD-10-CM | POA: Diagnosis not present

## 2023-01-13 DIAGNOSIS — Z79899 Other long term (current) drug therapy: Secondary | ICD-10-CM | POA: Diagnosis not present

## 2023-01-13 DIAGNOSIS — M7918 Myalgia, other site: Secondary | ICD-10-CM | POA: Diagnosis not present

## 2023-01-13 DIAGNOSIS — I10 Essential (primary) hypertension: Secondary | ICD-10-CM | POA: Diagnosis not present

## 2023-01-13 DIAGNOSIS — I959 Hypotension, unspecified: Secondary | ICD-10-CM | POA: Diagnosis not present

## 2023-01-13 DIAGNOSIS — Z885 Allergy status to narcotic agent status: Secondary | ICD-10-CM | POA: Diagnosis not present

## 2023-01-13 DIAGNOSIS — K219 Gastro-esophageal reflux disease without esophagitis: Secondary | ICD-10-CM | POA: Diagnosis not present

## 2023-01-13 DIAGNOSIS — R000 Tachycardia, unspecified: Secondary | ICD-10-CM | POA: Diagnosis not present

## 2023-01-13 DIAGNOSIS — Z87891 Personal history of nicotine dependence: Secondary | ICD-10-CM | POA: Diagnosis not present

## 2023-01-13 DIAGNOSIS — D696 Thrombocytopenia, unspecified: Secondary | ICD-10-CM | POA: Diagnosis not present

## 2023-01-13 DIAGNOSIS — Z888 Allergy status to other drugs, medicaments and biological substances status: Secondary | ICD-10-CM | POA: Diagnosis not present

## 2023-01-13 DIAGNOSIS — Z794 Long term (current) use of insulin: Secondary | ICD-10-CM | POA: Diagnosis not present

## 2023-01-13 DIAGNOSIS — R519 Headache, unspecified: Secondary | ICD-10-CM | POA: Diagnosis not present

## 2023-01-13 DIAGNOSIS — R739 Hyperglycemia, unspecified: Secondary | ICD-10-CM | POA: Diagnosis not present

## 2023-01-13 DIAGNOSIS — W19XXXA Unspecified fall, initial encounter: Secondary | ICD-10-CM | POA: Diagnosis not present

## 2023-01-13 DIAGNOSIS — E119 Type 2 diabetes mellitus without complications: Secondary | ICD-10-CM | POA: Diagnosis not present

## 2023-01-13 DIAGNOSIS — Z886 Allergy status to analgesic agent status: Secondary | ICD-10-CM | POA: Diagnosis not present

## 2023-01-13 DIAGNOSIS — Z91041 Radiographic dye allergy status: Secondary | ICD-10-CM | POA: Diagnosis not present

## 2023-01-13 DIAGNOSIS — M542 Cervicalgia: Secondary | ICD-10-CM | POA: Diagnosis not present

## 2023-01-13 DIAGNOSIS — R0689 Other abnormalities of breathing: Secondary | ICD-10-CM | POA: Diagnosis not present

## 2023-01-13 DIAGNOSIS — S0990XA Unspecified injury of head, initial encounter: Secondary | ICD-10-CM | POA: Diagnosis not present

## 2023-01-13 DIAGNOSIS — E785 Hyperlipidemia, unspecified: Secondary | ICD-10-CM | POA: Diagnosis not present

## 2023-01-14 DIAGNOSIS — W19XXXA Unspecified fall, initial encounter: Secondary | ICD-10-CM | POA: Diagnosis not present

## 2023-01-14 DIAGNOSIS — S0093XA Contusion of unspecified part of head, initial encounter: Secondary | ICD-10-CM | POA: Diagnosis not present

## 2023-01-17 DIAGNOSIS — Z85828 Personal history of other malignant neoplasm of skin: Secondary | ICD-10-CM | POA: Diagnosis not present

## 2023-01-17 DIAGNOSIS — L308 Other specified dermatitis: Secondary | ICD-10-CM | POA: Diagnosis not present

## 2023-01-21 DIAGNOSIS — J22 Unspecified acute lower respiratory infection: Secondary | ICD-10-CM | POA: Diagnosis not present

## 2023-01-21 DIAGNOSIS — L309 Dermatitis, unspecified: Secondary | ICD-10-CM | POA: Diagnosis not present

## 2023-01-21 DIAGNOSIS — K7460 Unspecified cirrhosis of liver: Secondary | ICD-10-CM | POA: Diagnosis not present

## 2023-01-24 DIAGNOSIS — Z85828 Personal history of other malignant neoplasm of skin: Secondary | ICD-10-CM | POA: Diagnosis not present

## 2023-01-24 DIAGNOSIS — L308 Other specified dermatitis: Secondary | ICD-10-CM | POA: Diagnosis not present

## 2023-01-29 DIAGNOSIS — D61818 Other pancytopenia: Secondary | ICD-10-CM | POA: Diagnosis not present

## 2023-01-29 DIAGNOSIS — E611 Iron deficiency: Secondary | ICD-10-CM | POA: Diagnosis not present

## 2023-02-06 ENCOUNTER — Other Ambulatory Visit: Payer: Self-pay | Admitting: Psychiatry

## 2023-02-06 DIAGNOSIS — F331 Major depressive disorder, recurrent, moderate: Secondary | ICD-10-CM

## 2023-02-06 DIAGNOSIS — F5105 Insomnia due to other mental disorder: Secondary | ICD-10-CM

## 2023-02-08 DIAGNOSIS — I8500 Esophageal varices without bleeding: Secondary | ICD-10-CM | POA: Diagnosis not present

## 2023-02-08 DIAGNOSIS — W19XXXA Unspecified fall, initial encounter: Secondary | ICD-10-CM | POA: Diagnosis not present

## 2023-02-08 DIAGNOSIS — J90 Pleural effusion, not elsewhere classified: Secondary | ICD-10-CM | POA: Diagnosis not present

## 2023-02-08 DIAGNOSIS — N179 Acute kidney failure, unspecified: Secondary | ICD-10-CM | POA: Diagnosis not present

## 2023-02-08 DIAGNOSIS — E1169 Type 2 diabetes mellitus with other specified complication: Secondary | ICD-10-CM | POA: Diagnosis not present

## 2023-02-08 DIAGNOSIS — R0600 Dyspnea, unspecified: Secondary | ICD-10-CM | POA: Diagnosis not present

## 2023-02-08 DIAGNOSIS — Z6841 Body Mass Index (BMI) 40.0 and over, adult: Secondary | ICD-10-CM | POA: Diagnosis not present

## 2023-02-08 DIAGNOSIS — K7581 Nonalcoholic steatohepatitis (NASH): Secondary | ICD-10-CM | POA: Diagnosis not present

## 2023-02-08 DIAGNOSIS — R739 Hyperglycemia, unspecified: Secondary | ICD-10-CM | POA: Diagnosis not present

## 2023-02-08 DIAGNOSIS — E1165 Type 2 diabetes mellitus with hyperglycemia: Secondary | ICD-10-CM | POA: Diagnosis not present

## 2023-02-08 DIAGNOSIS — K7210 Chronic hepatic failure without coma: Secondary | ICD-10-CM | POA: Diagnosis not present

## 2023-02-08 DIAGNOSIS — R188 Other ascites: Secondary | ICD-10-CM | POA: Diagnosis not present

## 2023-02-08 DIAGNOSIS — K7469 Other cirrhosis of liver: Secondary | ICD-10-CM | POA: Diagnosis not present

## 2023-02-08 DIAGNOSIS — M545 Low back pain, unspecified: Secondary | ICD-10-CM | POA: Diagnosis not present

## 2023-02-08 DIAGNOSIS — D696 Thrombocytopenia, unspecified: Secondary | ICD-10-CM | POA: Diagnosis not present

## 2023-02-08 DIAGNOSIS — I959 Hypotension, unspecified: Secondary | ICD-10-CM | POA: Diagnosis not present

## 2023-02-08 DIAGNOSIS — D6959 Other secondary thrombocytopenia: Secondary | ICD-10-CM | POA: Diagnosis not present

## 2023-02-08 DIAGNOSIS — Z66 Do not resuscitate: Secondary | ICD-10-CM | POA: Diagnosis not present

## 2023-02-08 DIAGNOSIS — K7682 Hepatic encephalopathy: Secondary | ICD-10-CM | POA: Diagnosis not present

## 2023-02-08 DIAGNOSIS — Z4682 Encounter for fitting and adjustment of non-vascular catheter: Secondary | ICD-10-CM | POA: Diagnosis not present

## 2023-02-08 DIAGNOSIS — I10 Essential (primary) hypertension: Secondary | ICD-10-CM | POA: Diagnosis not present

## 2023-02-08 DIAGNOSIS — K219 Gastro-esophageal reflux disease without esophagitis: Secondary | ICD-10-CM | POA: Diagnosis not present

## 2023-02-08 DIAGNOSIS — D509 Iron deficiency anemia, unspecified: Secondary | ICD-10-CM | POA: Diagnosis not present

## 2023-02-08 DIAGNOSIS — K766 Portal hypertension: Secondary | ICD-10-CM | POA: Diagnosis not present

## 2023-02-08 DIAGNOSIS — E119 Type 2 diabetes mellitus without complications: Secondary | ICD-10-CM | POA: Diagnosis not present

## 2023-02-08 DIAGNOSIS — R4182 Altered mental status, unspecified: Secondary | ICD-10-CM | POA: Diagnosis not present

## 2023-02-08 DIAGNOSIS — Z85828 Personal history of other malignant neoplasm of skin: Secondary | ICD-10-CM | POA: Diagnosis not present

## 2023-02-08 DIAGNOSIS — Z87891 Personal history of nicotine dependence: Secondary | ICD-10-CM | POA: Diagnosis not present

## 2023-02-08 DIAGNOSIS — I517 Cardiomegaly: Secondary | ICD-10-CM | POA: Diagnosis not present

## 2023-02-08 DIAGNOSIS — E871 Hypo-osmolality and hyponatremia: Secondary | ICD-10-CM | POA: Diagnosis not present

## 2023-02-08 DIAGNOSIS — R58 Hemorrhage, not elsewhere classified: Secondary | ICD-10-CM | POA: Diagnosis not present

## 2023-02-08 DIAGNOSIS — R0602 Shortness of breath: Secondary | ICD-10-CM | POA: Diagnosis not present

## 2023-02-08 DIAGNOSIS — F05 Delirium due to known physiological condition: Secondary | ICD-10-CM | POA: Diagnosis not present

## 2023-02-08 DIAGNOSIS — G894 Chronic pain syndrome: Secondary | ICD-10-CM | POA: Diagnosis not present

## 2023-02-08 DIAGNOSIS — Z7401 Bed confinement status: Secondary | ICD-10-CM | POA: Diagnosis not present

## 2023-02-08 DIAGNOSIS — Z9682 Presence of neurostimulator: Secondary | ICD-10-CM | POA: Diagnosis not present

## 2023-02-08 DIAGNOSIS — N400 Enlarged prostate without lower urinary tract symptoms: Secondary | ICD-10-CM | POA: Diagnosis not present

## 2023-02-08 DIAGNOSIS — E872 Acidosis, unspecified: Secondary | ICD-10-CM | POA: Diagnosis not present

## 2023-02-08 DIAGNOSIS — R846 Abnormal cytological findings in specimens from respiratory organs and thorax: Secondary | ICD-10-CM | POA: Diagnosis not present

## 2023-02-08 DIAGNOSIS — R918 Other nonspecific abnormal finding of lung field: Secondary | ICD-10-CM | POA: Diagnosis not present

## 2023-02-08 DIAGNOSIS — R609 Edema, unspecified: Secondary | ICD-10-CM | POA: Diagnosis not present

## 2023-02-08 DIAGNOSIS — R0781 Pleurodynia: Secondary | ICD-10-CM | POA: Diagnosis not present

## 2023-02-08 DIAGNOSIS — J810 Acute pulmonary edema: Secondary | ICD-10-CM | POA: Diagnosis not present

## 2023-02-08 DIAGNOSIS — J9601 Acute respiratory failure with hypoxia: Secondary | ICD-10-CM | POA: Diagnosis not present

## 2023-02-08 DIAGNOSIS — G4733 Obstructive sleep apnea (adult) (pediatric): Secondary | ICD-10-CM | POA: Diagnosis not present

## 2023-02-08 DIAGNOSIS — R339 Retention of urine, unspecified: Secondary | ICD-10-CM | POA: Diagnosis not present

## 2023-02-08 DIAGNOSIS — I8510 Secondary esophageal varices without bleeding: Secondary | ICD-10-CM | POA: Diagnosis not present

## 2023-02-08 DIAGNOSIS — Z978 Presence of other specified devices: Secondary | ICD-10-CM | POA: Diagnosis not present

## 2023-02-08 DIAGNOSIS — R161 Splenomegaly, not elsewhere classified: Secondary | ICD-10-CM | POA: Diagnosis not present

## 2023-02-08 DIAGNOSIS — E8779 Other fluid overload: Secondary | ICD-10-CM | POA: Diagnosis not present

## 2023-02-08 DIAGNOSIS — Z48813 Encounter for surgical aftercare following surgery on the respiratory system: Secondary | ICD-10-CM | POA: Diagnosis not present

## 2023-02-08 DIAGNOSIS — K652 Spontaneous bacterial peritonitis: Secondary | ICD-10-CM | POA: Diagnosis not present

## 2023-02-08 DIAGNOSIS — J8489 Other specified interstitial pulmonary diseases: Secondary | ICD-10-CM | POA: Diagnosis not present

## 2023-02-08 DIAGNOSIS — E782 Mixed hyperlipidemia: Secondary | ICD-10-CM | POA: Diagnosis not present

## 2023-02-08 DIAGNOSIS — Z794 Long term (current) use of insulin: Secondary | ICD-10-CM | POA: Diagnosis not present

## 2023-02-08 DIAGNOSIS — R9082 White matter disease, unspecified: Secondary | ICD-10-CM | POA: Diagnosis not present

## 2023-02-08 DIAGNOSIS — R0902 Hypoxemia: Secondary | ICD-10-CM | POA: Diagnosis not present

## 2023-02-08 DIAGNOSIS — K760 Fatty (change of) liver, not elsewhere classified: Secondary | ICD-10-CM | POA: Diagnosis not present

## 2023-02-08 DIAGNOSIS — R1310 Dysphagia, unspecified: Secondary | ICD-10-CM | POA: Diagnosis not present

## 2023-02-08 DIAGNOSIS — Z515 Encounter for palliative care: Secondary | ICD-10-CM | POA: Diagnosis not present

## 2023-02-08 DIAGNOSIS — K7460 Unspecified cirrhosis of liver: Secondary | ICD-10-CM | POA: Diagnosis not present

## 2023-02-08 DIAGNOSIS — F329 Major depressive disorder, single episode, unspecified: Secondary | ICD-10-CM | POA: Diagnosis not present

## 2023-02-08 DIAGNOSIS — R531 Weakness: Secondary | ICD-10-CM | POA: Diagnosis not present

## 2023-02-09 DIAGNOSIS — I10 Essential (primary) hypertension: Secondary | ICD-10-CM | POA: Diagnosis not present

## 2023-02-09 DIAGNOSIS — E1165 Type 2 diabetes mellitus with hyperglycemia: Secondary | ICD-10-CM | POA: Diagnosis not present

## 2023-02-09 DIAGNOSIS — K652 Spontaneous bacterial peritonitis: Secondary | ICD-10-CM | POA: Diagnosis not present

## 2023-02-09 DIAGNOSIS — Z794 Long term (current) use of insulin: Secondary | ICD-10-CM | POA: Diagnosis not present

## 2023-02-09 DIAGNOSIS — K7460 Unspecified cirrhosis of liver: Secondary | ICD-10-CM | POA: Diagnosis not present

## 2023-02-09 DIAGNOSIS — R188 Other ascites: Secondary | ICD-10-CM | POA: Diagnosis not present

## 2023-02-09 DIAGNOSIS — E8779 Other fluid overload: Secondary | ICD-10-CM | POA: Diagnosis not present

## 2023-02-09 DIAGNOSIS — G894 Chronic pain syndrome: Secondary | ICD-10-CM | POA: Diagnosis not present

## 2023-02-09 DIAGNOSIS — R161 Splenomegaly, not elsewhere classified: Secondary | ICD-10-CM | POA: Diagnosis not present

## 2023-02-09 DIAGNOSIS — R339 Retention of urine, unspecified: Secondary | ICD-10-CM | POA: Diagnosis not present

## 2023-02-09 DIAGNOSIS — K7581 Nonalcoholic steatohepatitis (NASH): Secondary | ICD-10-CM | POA: Diagnosis not present

## 2023-02-09 DIAGNOSIS — R4182 Altered mental status, unspecified: Secondary | ICD-10-CM | POA: Diagnosis not present

## 2023-02-09 DIAGNOSIS — J9601 Acute respiratory failure with hypoxia: Secondary | ICD-10-CM | POA: Diagnosis not present

## 2023-02-10 DIAGNOSIS — R161 Splenomegaly, not elsewhere classified: Secondary | ICD-10-CM | POA: Diagnosis not present

## 2023-02-10 DIAGNOSIS — Z794 Long term (current) use of insulin: Secondary | ICD-10-CM | POA: Diagnosis not present

## 2023-02-10 DIAGNOSIS — K652 Spontaneous bacterial peritonitis: Secondary | ICD-10-CM | POA: Diagnosis not present

## 2023-02-10 DIAGNOSIS — K7581 Nonalcoholic steatohepatitis (NASH): Secondary | ICD-10-CM | POA: Diagnosis not present

## 2023-02-10 DIAGNOSIS — E1165 Type 2 diabetes mellitus with hyperglycemia: Secondary | ICD-10-CM | POA: Diagnosis not present

## 2023-02-10 DIAGNOSIS — I10 Essential (primary) hypertension: Secondary | ICD-10-CM | POA: Diagnosis not present

## 2023-02-10 DIAGNOSIS — R4182 Altered mental status, unspecified: Secondary | ICD-10-CM | POA: Diagnosis not present

## 2023-02-10 DIAGNOSIS — G894 Chronic pain syndrome: Secondary | ICD-10-CM | POA: Diagnosis not present

## 2023-02-10 DIAGNOSIS — J9601 Acute respiratory failure with hypoxia: Secondary | ICD-10-CM | POA: Diagnosis not present

## 2023-02-10 DIAGNOSIS — R339 Retention of urine, unspecified: Secondary | ICD-10-CM | POA: Diagnosis not present

## 2023-02-10 DIAGNOSIS — K7460 Unspecified cirrhosis of liver: Secondary | ICD-10-CM | POA: Diagnosis not present

## 2023-02-10 DIAGNOSIS — E8779 Other fluid overload: Secondary | ICD-10-CM | POA: Diagnosis not present

## 2023-02-10 DIAGNOSIS — Z4682 Encounter for fitting and adjustment of non-vascular catheter: Secondary | ICD-10-CM | POA: Diagnosis not present

## 2023-02-11 DIAGNOSIS — E1165 Type 2 diabetes mellitus with hyperglycemia: Secondary | ICD-10-CM | POA: Diagnosis not present

## 2023-02-11 DIAGNOSIS — K652 Spontaneous bacterial peritonitis: Secondary | ICD-10-CM | POA: Diagnosis not present

## 2023-02-11 DIAGNOSIS — K7581 Nonalcoholic steatohepatitis (NASH): Secondary | ICD-10-CM | POA: Diagnosis not present

## 2023-02-11 DIAGNOSIS — I10 Essential (primary) hypertension: Secondary | ICD-10-CM | POA: Diagnosis not present

## 2023-02-11 DIAGNOSIS — J9601 Acute respiratory failure with hypoxia: Secondary | ICD-10-CM | POA: Diagnosis not present

## 2023-02-11 DIAGNOSIS — R4182 Altered mental status, unspecified: Secondary | ICD-10-CM | POA: Diagnosis not present

## 2023-02-11 DIAGNOSIS — E8779 Other fluid overload: Secondary | ICD-10-CM | POA: Diagnosis not present

## 2023-02-11 DIAGNOSIS — R339 Retention of urine, unspecified: Secondary | ICD-10-CM | POA: Diagnosis not present

## 2023-02-11 DIAGNOSIS — Z794 Long term (current) use of insulin: Secondary | ICD-10-CM | POA: Diagnosis not present

## 2023-02-11 DIAGNOSIS — R161 Splenomegaly, not elsewhere classified: Secondary | ICD-10-CM | POA: Diagnosis not present

## 2023-02-11 DIAGNOSIS — Z4682 Encounter for fitting and adjustment of non-vascular catheter: Secondary | ICD-10-CM | POA: Diagnosis not present

## 2023-02-11 DIAGNOSIS — Z9682 Presence of neurostimulator: Secondary | ICD-10-CM | POA: Diagnosis not present

## 2023-02-11 DIAGNOSIS — Z978 Presence of other specified devices: Secondary | ICD-10-CM | POA: Diagnosis not present

## 2023-02-11 DIAGNOSIS — G894 Chronic pain syndrome: Secondary | ICD-10-CM | POA: Diagnosis not present

## 2023-02-11 DIAGNOSIS — K7460 Unspecified cirrhosis of liver: Secondary | ICD-10-CM | POA: Diagnosis not present

## 2023-02-13 DIAGNOSIS — R918 Other nonspecific abnormal finding of lung field: Secondary | ICD-10-CM | POA: Diagnosis not present

## 2023-02-13 DIAGNOSIS — J90 Pleural effusion, not elsewhere classified: Secondary | ICD-10-CM | POA: Diagnosis not present

## 2023-02-15 DIAGNOSIS — R188 Other ascites: Secondary | ICD-10-CM | POA: Diagnosis not present

## 2023-02-19 DIAGNOSIS — Z515 Encounter for palliative care: Secondary | ICD-10-CM | POA: Diagnosis not present

## 2023-02-19 DIAGNOSIS — J9601 Acute respiratory failure with hypoxia: Secondary | ICD-10-CM | POA: Diagnosis not present

## 2023-02-19 DIAGNOSIS — R4182 Altered mental status, unspecified: Secondary | ICD-10-CM | POA: Diagnosis not present

## 2023-02-19 DIAGNOSIS — R846 Abnormal cytological findings in specimens from respiratory organs and thorax: Secondary | ICD-10-CM | POA: Diagnosis not present

## 2023-02-19 DIAGNOSIS — R0600 Dyspnea, unspecified: Secondary | ICD-10-CM | POA: Diagnosis not present

## 2023-02-19 DIAGNOSIS — K7581 Nonalcoholic steatohepatitis (NASH): Secondary | ICD-10-CM | POA: Diagnosis not present

## 2023-02-19 DIAGNOSIS — G894 Chronic pain syndrome: Secondary | ICD-10-CM | POA: Diagnosis not present

## 2023-02-19 DIAGNOSIS — K7460 Unspecified cirrhosis of liver: Secondary | ICD-10-CM | POA: Diagnosis not present

## 2023-02-20 DIAGNOSIS — Z515 Encounter for palliative care: Secondary | ICD-10-CM | POA: Diagnosis not present

## 2023-02-20 DIAGNOSIS — J9601 Acute respiratory failure with hypoxia: Secondary | ICD-10-CM | POA: Diagnosis not present

## 2023-02-21 DIAGNOSIS — J9601 Acute respiratory failure with hypoxia: Secondary | ICD-10-CM | POA: Diagnosis not present

## 2023-02-21 DIAGNOSIS — Z515 Encounter for palliative care: Secondary | ICD-10-CM | POA: Diagnosis not present

## 2023-02-22 ENCOUNTER — Other Ambulatory Visit: Payer: Self-pay | Admitting: Psychiatry

## 2023-02-22 DIAGNOSIS — J9601 Acute respiratory failure with hypoxia: Secondary | ICD-10-CM | POA: Diagnosis not present

## 2023-02-22 DIAGNOSIS — F5105 Insomnia due to other mental disorder: Secondary | ICD-10-CM

## 2023-02-22 DIAGNOSIS — F411 Generalized anxiety disorder: Secondary | ICD-10-CM

## 2023-02-22 DIAGNOSIS — Z515 Encounter for palliative care: Secondary | ICD-10-CM | POA: Diagnosis not present

## 2023-02-22 DIAGNOSIS — F4001 Agoraphobia with panic disorder: Secondary | ICD-10-CM

## 2023-03-16 NOTE — Telephone Encounter (Signed)
 It looks as thought pt is in palliative care with resp dz.  Will reduce alprazolam to 1.5 mg QID from 2 mg QID DT his health problems.

## 2023-03-16 DEATH — deceased

## 2023-06-18 ENCOUNTER — Ambulatory Visit: Payer: Medicare Other | Admitting: Psychiatry
